# Patient Record
Sex: Female | Born: 1945 | Race: Black or African American | Hispanic: No | Smoking: Former smoker
Health system: Southern US, Community
[De-identification: ages and names within clinical notes are randomized; demographics above are authoritative.]

## PROBLEM LIST (undated history)

## (undated) DIAGNOSIS — I739 Peripheral vascular disease, unspecified: Secondary | ICD-10-CM

## (undated) DIAGNOSIS — I429 Cardiomyopathy, unspecified: Secondary | ICD-10-CM

## (undated) DIAGNOSIS — R55 Syncope and collapse: Secondary | ICD-10-CM

## (undated) DIAGNOSIS — H359 Unspecified retinal disorder: Secondary | ICD-10-CM

## (undated) DIAGNOSIS — I10 Essential (primary) hypertension: Secondary | ICD-10-CM

## (undated) DIAGNOSIS — G459 Transient cerebral ischemic attack, unspecified: Secondary | ICD-10-CM

## (undated) DIAGNOSIS — G709 Myoneural disorder, unspecified: Secondary | ICD-10-CM

## (undated) DIAGNOSIS — E119 Type 2 diabetes mellitus without complications: Secondary | ICD-10-CM

## (undated) DIAGNOSIS — I2510 Atherosclerotic heart disease of native coronary artery without angina pectoris: Secondary | ICD-10-CM

## (undated) DIAGNOSIS — Z531 Procedure and treatment not carried out because of patient's decision for reasons of belief and group pressure: Secondary | ICD-10-CM

## (undated) DIAGNOSIS — E785 Hyperlipidemia, unspecified: Secondary | ICD-10-CM

## (undated) HISTORY — PX: FEMORAL ARTERY STENT: SHX1583

## (undated) HISTORY — PX: VAGINAL HYSTERECTOMY: SHX2639

## (undated) HISTORY — PX: DILATION AND CURETTAGE OF UTERUS: SHX78

## (undated) HISTORY — PX: TUBAL LIGATION: SHX77

## (undated) HISTORY — DX: Hyperlipidemia, unspecified: E78.5

---

## 1997-06-03 ENCOUNTER — Other Ambulatory Visit: Admission: RE | Admit: 1997-06-03 | Discharge: 1997-06-03 | Payer: Self-pay | Admitting: Obstetrics and Gynecology

## 1999-10-31 ENCOUNTER — Emergency Department (HOSPITAL_COMMUNITY): Admission: EM | Admit: 1999-10-31 | Discharge: 1999-10-31 | Payer: Self-pay | Admitting: Emergency Medicine

## 2002-06-08 ENCOUNTER — Ambulatory Visit (HOSPITAL_COMMUNITY): Admission: RE | Admit: 2002-06-08 | Discharge: 2002-06-08 | Payer: Self-pay | Admitting: Family Medicine

## 2004-08-26 ENCOUNTER — Encounter: Admission: RE | Admit: 2004-08-26 | Discharge: 2004-08-26 | Payer: Self-pay | Admitting: Internal Medicine

## 2004-09-21 ENCOUNTER — Encounter: Admission: RE | Admit: 2004-09-21 | Discharge: 2004-09-21 | Payer: Self-pay | Admitting: Internal Medicine

## 2006-06-30 ENCOUNTER — Emergency Department (HOSPITAL_COMMUNITY): Admission: EM | Admit: 2006-06-30 | Discharge: 2006-06-30 | Payer: Self-pay | Admitting: Emergency Medicine

## 2007-11-02 ENCOUNTER — Encounter (INDEPENDENT_AMBULATORY_CARE_PROVIDER_SITE_OTHER): Payer: Self-pay | Admitting: Nurse Practitioner

## 2007-11-02 ENCOUNTER — Ambulatory Visit: Payer: Self-pay | Admitting: Internal Medicine

## 2007-11-02 DIAGNOSIS — I10 Essential (primary) hypertension: Secondary | ICD-10-CM | POA: Insufficient documentation

## 2007-11-02 DIAGNOSIS — E785 Hyperlipidemia, unspecified: Secondary | ICD-10-CM | POA: Insufficient documentation

## 2007-11-02 DIAGNOSIS — E119 Type 2 diabetes mellitus without complications: Secondary | ICD-10-CM | POA: Insufficient documentation

## 2007-11-02 DIAGNOSIS — E1149 Type 2 diabetes mellitus with other diabetic neurological complication: Secondary | ICD-10-CM

## 2007-11-02 LAB — CONVERTED CEMR LAB: Blood Glucose, Fingerstick: 183

## 2007-12-13 ENCOUNTER — Ambulatory Visit: Payer: Self-pay | Admitting: Internal Medicine

## 2007-12-13 ENCOUNTER — Telehealth (INDEPENDENT_AMBULATORY_CARE_PROVIDER_SITE_OTHER): Payer: Self-pay | Admitting: *Deleted

## 2007-12-13 DIAGNOSIS — R0989 Other specified symptoms and signs involving the circulatory and respiratory systems: Secondary | ICD-10-CM

## 2007-12-13 LAB — CONVERTED CEMR LAB
Bilirubin Urine: NEGATIVE
Nitrite: NEGATIVE
Protein, U semiquant: 30
Specific Gravity, Urine: 1.015
Urobilinogen, UA: 0.2

## 2007-12-18 ENCOUNTER — Ambulatory Visit (HOSPITAL_COMMUNITY): Admission: RE | Admit: 2007-12-18 | Discharge: 2007-12-18 | Payer: Self-pay | Admitting: Internal Medicine

## 2007-12-19 ENCOUNTER — Ambulatory Visit: Payer: Self-pay | Admitting: Internal Medicine

## 2007-12-21 ENCOUNTER — Encounter (INDEPENDENT_AMBULATORY_CARE_PROVIDER_SITE_OTHER): Payer: Self-pay | Admitting: Internal Medicine

## 2007-12-21 LAB — CONVERTED CEMR LAB
ALT: 15 units/L (ref 0–35)
AST: 16 units/L (ref 0–37)
Basophils Absolute: 0 10*3/uL (ref 0.0–0.1)
Basophils Relative: 1 % (ref 0–1)
Creatinine, Ser: 0.78 mg/dL (ref 0.40–1.20)
Eosinophils Relative: 3 % (ref 0–5)
HCT: 37.2 % (ref 36.0–46.0)
Hemoglobin: 12 g/dL (ref 12.0–15.0)
LDL Cholesterol: 138 mg/dL — ABNORMAL HIGH (ref 0–99)
MCHC: 32.3 g/dL (ref 30.0–36.0)
Microalb, Ur: 9.14 mg/dL — ABNORMAL HIGH (ref 0.00–1.89)
Monocytes Absolute: 0.3 10*3/uL (ref 0.1–1.0)
Neutro Abs: 1.8 10*3/uL (ref 1.7–7.7)
Platelets: 182 10*3/uL (ref 150–400)
RDW: 12.8 % (ref 11.5–15.5)
Total Bilirubin: 0.5 mg/dL (ref 0.3–1.2)
Total CHOL/HDL Ratio: 4
VLDL: 15 mg/dL (ref 0–40)

## 2007-12-26 ENCOUNTER — Encounter (INDEPENDENT_AMBULATORY_CARE_PROVIDER_SITE_OTHER): Payer: Self-pay | Admitting: Internal Medicine

## 2007-12-26 ENCOUNTER — Ambulatory Visit: Payer: Self-pay | Admitting: Family Medicine

## 2007-12-28 ENCOUNTER — Ambulatory Visit: Payer: Self-pay | Admitting: *Deleted

## 2008-01-01 ENCOUNTER — Encounter: Admission: RE | Admit: 2008-01-01 | Discharge: 2008-01-01 | Payer: Self-pay | Admitting: Internal Medicine

## 2008-01-01 ENCOUNTER — Encounter (INDEPENDENT_AMBULATORY_CARE_PROVIDER_SITE_OTHER): Payer: Self-pay | Admitting: *Deleted

## 2008-01-01 ENCOUNTER — Telehealth (INDEPENDENT_AMBULATORY_CARE_PROVIDER_SITE_OTHER): Payer: Self-pay | Admitting: Internal Medicine

## 2008-01-01 ENCOUNTER — Encounter (INDEPENDENT_AMBULATORY_CARE_PROVIDER_SITE_OTHER): Payer: Self-pay | Admitting: Internal Medicine

## 2008-01-16 ENCOUNTER — Encounter (INDEPENDENT_AMBULATORY_CARE_PROVIDER_SITE_OTHER): Payer: Self-pay | Admitting: Internal Medicine

## 2008-01-18 ENCOUNTER — Encounter (INDEPENDENT_AMBULATORY_CARE_PROVIDER_SITE_OTHER): Payer: Self-pay | Admitting: Internal Medicine

## 2008-02-19 ENCOUNTER — Encounter (INDEPENDENT_AMBULATORY_CARE_PROVIDER_SITE_OTHER): Payer: Self-pay | Admitting: Internal Medicine

## 2008-02-19 ENCOUNTER — Telehealth (INDEPENDENT_AMBULATORY_CARE_PROVIDER_SITE_OTHER): Payer: Self-pay | Admitting: Internal Medicine

## 2008-02-26 ENCOUNTER — Ambulatory Visit: Payer: Self-pay | Admitting: Internal Medicine

## 2008-03-26 ENCOUNTER — Encounter (INDEPENDENT_AMBULATORY_CARE_PROVIDER_SITE_OTHER): Payer: Self-pay | Admitting: Internal Medicine

## 2008-03-27 ENCOUNTER — Encounter (INDEPENDENT_AMBULATORY_CARE_PROVIDER_SITE_OTHER): Payer: Self-pay | Admitting: Internal Medicine

## 2008-03-31 LAB — CONVERTED CEMR LAB
ALT: 16 units/L (ref 0–35)
AST: 14 units/L (ref 0–37)
Bilirubin, Direct: 0.1 mg/dL (ref 0.0–0.3)
Cholesterol: 211 mg/dL — ABNORMAL HIGH (ref 0–200)
Indirect Bilirubin: 0.2 mg/dL (ref 0.0–0.9)
VLDL: 14 mg/dL (ref 0–40)

## 2008-04-16 ENCOUNTER — Encounter (INDEPENDENT_AMBULATORY_CARE_PROVIDER_SITE_OTHER): Payer: Self-pay | Admitting: Internal Medicine

## 2008-05-01 ENCOUNTER — Encounter (INDEPENDENT_AMBULATORY_CARE_PROVIDER_SITE_OTHER): Payer: Self-pay | Admitting: Internal Medicine

## 2008-05-07 ENCOUNTER — Ambulatory Visit: Payer: Self-pay | Admitting: Internal Medicine

## 2008-05-07 DIAGNOSIS — R351 Nocturia: Secondary | ICD-10-CM

## 2008-05-07 DIAGNOSIS — M25569 Pain in unspecified knee: Secondary | ICD-10-CM | POA: Insufficient documentation

## 2008-05-07 LAB — CONVERTED CEMR LAB
Blood Glucose, Fingerstick: 153
Hgb A1c MFr Bld: 8.2 %

## 2008-05-14 ENCOUNTER — Ambulatory Visit: Payer: Self-pay | Admitting: Internal Medicine

## 2008-06-04 ENCOUNTER — Ambulatory Visit: Payer: Self-pay | Admitting: Internal Medicine

## 2008-06-19 ENCOUNTER — Ambulatory Visit: Payer: Self-pay | Admitting: Internal Medicine

## 2008-07-11 LAB — CONVERTED CEMR LAB
AST: 18 units/L (ref 0–37)
Albumin: 4 g/dL (ref 3.5–5.2)
HDL: 54 mg/dL (ref >39)
Total Bilirubin: 0.3 mg/dL (ref 0.3–1.2)
Total CHOL/HDL Ratio: 3.2

## 2008-07-23 ENCOUNTER — Ambulatory Visit: Payer: Self-pay | Admitting: Internal Medicine

## 2008-08-20 ENCOUNTER — Telehealth (INDEPENDENT_AMBULATORY_CARE_PROVIDER_SITE_OTHER): Payer: Self-pay | Admitting: Internal Medicine

## 2008-09-10 ENCOUNTER — Ambulatory Visit: Payer: Self-pay | Admitting: Internal Medicine

## 2008-09-10 DIAGNOSIS — M79609 Pain in unspecified limb: Secondary | ICD-10-CM

## 2008-09-10 LAB — CONVERTED CEMR LAB
Blood Glucose, Fingerstick: 164
LDL Cholesterol: 79 mg/dL (ref 0–99)
Triglycerides: 59 mg/dL (ref <150)

## 2008-09-17 ENCOUNTER — Ambulatory Visit (HOSPITAL_COMMUNITY): Admission: RE | Admit: 2008-09-17 | Discharge: 2008-09-17 | Payer: Self-pay | Admitting: Gastroenterology

## 2008-09-19 ENCOUNTER — Ambulatory Visit: Payer: Self-pay | Admitting: Vascular Surgery

## 2008-09-19 ENCOUNTER — Encounter (INDEPENDENT_AMBULATORY_CARE_PROVIDER_SITE_OTHER): Payer: Self-pay | Admitting: Internal Medicine

## 2008-09-19 ENCOUNTER — Ambulatory Visit (HOSPITAL_COMMUNITY): Admission: RE | Admit: 2008-09-19 | Discharge: 2008-09-19 | Payer: Self-pay | Admitting: Internal Medicine

## 2008-10-01 ENCOUNTER — Encounter (INDEPENDENT_AMBULATORY_CARE_PROVIDER_SITE_OTHER): Payer: Self-pay | Admitting: Internal Medicine

## 2008-10-01 DIAGNOSIS — I739 Peripheral vascular disease, unspecified: Secondary | ICD-10-CM

## 2008-10-08 ENCOUNTER — Encounter (INDEPENDENT_AMBULATORY_CARE_PROVIDER_SITE_OTHER): Payer: Self-pay | Admitting: Internal Medicine

## 2008-10-14 ENCOUNTER — Encounter (INDEPENDENT_AMBULATORY_CARE_PROVIDER_SITE_OTHER): Payer: Self-pay | Admitting: Internal Medicine

## 2008-10-29 ENCOUNTER — Encounter (HOSPITAL_COMMUNITY): Admission: RE | Admit: 2008-10-29 | Discharge: 2009-01-10 | Payer: Self-pay | Admitting: Cardiovascular Disease

## 2008-11-13 ENCOUNTER — Ambulatory Visit: Payer: Self-pay | Admitting: Internal Medicine

## 2008-11-15 ENCOUNTER — Ambulatory Visit: Payer: Self-pay | Admitting: Internal Medicine

## 2008-11-15 LAB — CONVERTED CEMR LAB
Bilirubin Urine: NEGATIVE
Blood in Urine, dipstick: NEGATIVE
Glucose, Urine, Semiquant: NEGATIVE
Protein, U semiquant: 100
Specific Gravity, Urine: >=1.03

## 2008-11-19 ENCOUNTER — Inpatient Hospital Stay (HOSPITAL_COMMUNITY): Admission: RE | Admit: 2008-11-19 | Discharge: 2008-11-20 | Payer: Self-pay | Admitting: Cardiovascular Disease

## 2008-11-21 ENCOUNTER — Ambulatory Visit: Payer: Self-pay | Admitting: Internal Medicine

## 2008-11-21 LAB — CONVERTED CEMR LAB
Chloride: 107 meq/L (ref 96–112)
Eosinophils Relative: 2 % (ref 0–5)
HCT: 33.8 % — ABNORMAL LOW (ref 36.0–46.0)
Hemoglobin: 10.7 g/dL — ABNORMAL LOW (ref 12.0–15.0)
INR: 1 (ref 0.0–1.5)
Lymphocytes Relative: 44 % (ref 12–46)
Lymphs Abs: 2.1 10*3/uL (ref 0.7–4.0)
Monocytes Absolute: 0.2 10*3/uL (ref 0.1–1.0)
Monocytes Relative: 5 % (ref 3–12)
Potassium: 4.3 meq/L (ref 3.5–5.3)
RDW: 14.2 % (ref 11.5–15.5)
Sodium: 143 meq/L (ref 135–145)
WBC: 4.7 10*3/uL (ref 4.0–10.5)
aPTT: 26 s (ref 24–37)

## 2008-12-04 ENCOUNTER — Encounter (INDEPENDENT_AMBULATORY_CARE_PROVIDER_SITE_OTHER): Payer: Self-pay | Admitting: Internal Medicine

## 2008-12-05 ENCOUNTER — Encounter (INDEPENDENT_AMBULATORY_CARE_PROVIDER_SITE_OTHER): Payer: Self-pay | Admitting: Internal Medicine

## 2008-12-11 ENCOUNTER — Ambulatory Visit: Payer: Self-pay | Admitting: Internal Medicine

## 2008-12-11 DIAGNOSIS — D649 Anemia, unspecified: Secondary | ICD-10-CM

## 2008-12-11 DIAGNOSIS — E11311 Type 2 diabetes mellitus with unspecified diabetic retinopathy with macular edema: Secondary | ICD-10-CM

## 2008-12-11 LAB — CONVERTED CEMR LAB: Blood Glucose, Fingerstick: 134

## 2008-12-12 ENCOUNTER — Encounter (INDEPENDENT_AMBULATORY_CARE_PROVIDER_SITE_OTHER): Payer: Self-pay | Admitting: Internal Medicine

## 2008-12-12 ENCOUNTER — Telehealth (INDEPENDENT_AMBULATORY_CARE_PROVIDER_SITE_OTHER): Payer: Self-pay | Admitting: Internal Medicine

## 2008-12-23 ENCOUNTER — Encounter (INDEPENDENT_AMBULATORY_CARE_PROVIDER_SITE_OTHER): Payer: Self-pay | Admitting: Internal Medicine

## 2008-12-27 ENCOUNTER — Ambulatory Visit: Payer: Self-pay | Admitting: Internal Medicine

## 2008-12-29 ENCOUNTER — Encounter (INDEPENDENT_AMBULATORY_CARE_PROVIDER_SITE_OTHER): Payer: Self-pay | Admitting: Internal Medicine

## 2008-12-29 DIAGNOSIS — D509 Iron deficiency anemia, unspecified: Secondary | ICD-10-CM

## 2008-12-29 LAB — CONVERTED CEMR LAB
Basophils Absolute: 0.1 10*3/uL (ref 0.0–0.1)
Eosinophils Absolute: 0.2 10*3/uL (ref 0.0–0.7)
Iron: 65 ug/dL (ref 42–145)
Lymphs Abs: 2.1 10*3/uL (ref 0.7–4.0)
MCV: 95.5 fL (ref 78.0–100.0)
Neutrophils Relative %: 46 % (ref 43–77)
Platelets: 211 10*3/uL (ref 150–400)
RDW: 14.3 % (ref 11.5–15.5)
Saturation Ratios: 19 % — ABNORMAL LOW (ref 20–55)
UIBC: 270 ug/dL
WBC: 4.9 10*3/uL (ref 4.0–10.5)

## 2009-01-01 ENCOUNTER — Encounter (INDEPENDENT_AMBULATORY_CARE_PROVIDER_SITE_OTHER): Payer: Self-pay | Admitting: Internal Medicine

## 2009-01-02 ENCOUNTER — Ambulatory Visit (HOSPITAL_COMMUNITY): Admission: RE | Admit: 2009-01-02 | Discharge: 2009-01-02 | Payer: Self-pay | Admitting: Internal Medicine

## 2009-01-27 ENCOUNTER — Encounter (INDEPENDENT_AMBULATORY_CARE_PROVIDER_SITE_OTHER): Payer: Self-pay | Admitting: *Deleted

## 2009-02-17 ENCOUNTER — Inpatient Hospital Stay (HOSPITAL_COMMUNITY): Admission: RE | Admit: 2009-02-17 | Discharge: 2009-02-18 | Payer: Self-pay | Admitting: Cardiovascular Disease

## 2009-03-05 ENCOUNTER — Encounter (INDEPENDENT_AMBULATORY_CARE_PROVIDER_SITE_OTHER): Payer: Self-pay | Admitting: Internal Medicine

## 2009-05-15 ENCOUNTER — Ambulatory Visit: Payer: Self-pay | Admitting: Internal Medicine

## 2009-05-15 LAB — CONVERTED CEMR LAB: Blood Glucose, Fingerstick: 271

## 2009-06-16 ENCOUNTER — Encounter (INDEPENDENT_AMBULATORY_CARE_PROVIDER_SITE_OTHER): Payer: Self-pay | Admitting: Internal Medicine

## 2009-07-07 ENCOUNTER — Encounter: Admission: RE | Admit: 2009-07-07 | Discharge: 2009-07-07 | Payer: Self-pay | Admitting: Cardiovascular Disease

## 2009-07-10 ENCOUNTER — Inpatient Hospital Stay (HOSPITAL_COMMUNITY): Admission: RE | Admit: 2009-07-10 | Discharge: 2009-07-12 | Payer: Self-pay | Admitting: Cardiovascular Disease

## 2009-07-24 ENCOUNTER — Ambulatory Visit: Payer: Self-pay | Admitting: Internal Medicine

## 2009-08-08 ENCOUNTER — Ambulatory Visit: Payer: Self-pay | Admitting: Internal Medicine

## 2009-08-26 DIAGNOSIS — R809 Proteinuria, unspecified: Secondary | ICD-10-CM | POA: Insufficient documentation

## 2009-08-26 LAB — CONVERTED CEMR LAB
ALT: 14 units/L (ref 0–35)
BUN: 22 mg/dL (ref 6–23)
Basophils Relative: 0 % (ref 0–1)
CO2: 28 meq/L (ref 19–32)
Calcium: 9.5 mg/dL (ref 8.4–10.5)
Chloride: 106 meq/L (ref 96–112)
Cholesterol: 140 mg/dL (ref 0–200)
Creatinine, Ser: 1.13 mg/dL (ref 0.40–1.20)
Eosinophils Absolute: 0.3 10*3/uL (ref 0.0–0.7)
Eosinophils Relative: 6 % — ABNORMAL HIGH (ref 0–5)
Glucose, Bld: 117 mg/dL — ABNORMAL HIGH (ref 70–99)
HCT: 33.8 % — ABNORMAL LOW (ref 36.0–46.0)
Hemoglobin: 10.7 g/dL — ABNORMAL LOW (ref 12.0–15.0)
Lymphs Abs: 1.9 10*3/uL (ref 0.7–4.0)
MCHC: 31.7 g/dL (ref 30.0–36.0)
MCV: 94.9 fL (ref 78.0–100.0)
Monocytes Absolute: 0.3 10*3/uL (ref 0.1–1.0)
Monocytes Relative: 6 % (ref 3–12)
Neutrophils Relative %: 47 % (ref 43–77)
RBC: 3.56 M/uL — ABNORMAL LOW (ref 3.87–5.11)
Total Bilirubin: 0.3 mg/dL (ref 0.3–1.2)
Total CHOL/HDL Ratio: 2.9
Triglycerides: 56 mg/dL (ref <150)
VLDL: 11 mg/dL (ref 0–40)
WBC: 4.7 10*3/uL (ref 4.0–10.5)

## 2009-09-12 ENCOUNTER — Ambulatory Visit: Payer: Self-pay | Admitting: Internal Medicine

## 2009-09-12 LAB — CONVERTED CEMR LAB: Blood Glucose, Fingerstick: 175

## 2009-09-26 ENCOUNTER — Ambulatory Visit: Payer: Self-pay | Admitting: Internal Medicine

## 2009-10-14 ENCOUNTER — Telehealth (INDEPENDENT_AMBULATORY_CARE_PROVIDER_SITE_OTHER): Payer: Self-pay | Admitting: Internal Medicine

## 2009-10-22 ENCOUNTER — Telehealth (INDEPENDENT_AMBULATORY_CARE_PROVIDER_SITE_OTHER): Payer: Self-pay | Admitting: Internal Medicine

## 2009-10-30 ENCOUNTER — Telehealth (INDEPENDENT_AMBULATORY_CARE_PROVIDER_SITE_OTHER): Payer: Self-pay | Admitting: *Deleted

## 2009-10-30 ENCOUNTER — Ambulatory Visit: Payer: Self-pay | Admitting: Internal Medicine

## 2009-11-28 ENCOUNTER — Ambulatory Visit: Payer: Self-pay | Admitting: Internal Medicine

## 2009-11-28 DIAGNOSIS — R82998 Other abnormal findings in urine: Secondary | ICD-10-CM | POA: Insufficient documentation

## 2009-11-28 LAB — CONVERTED CEMR LAB
Bilirubin Urine: NEGATIVE
Glucose, Urine, Semiquant: NEGATIVE
Hgb A1c MFr Bld: 6 %
Lymphocytes Relative: 35 % (ref 12–46)
Lymphs Abs: 1.8 10*3/uL (ref 0.7–4.0)
Monocytes Relative: 6 % (ref 3–12)
Neutrophils Relative %: 53 % (ref 43–77)
Nitrite: NEGATIVE
Platelets: 188 10*3/uL (ref 150–400)
RBC: 3.79 M/uL — ABNORMAL LOW (ref 3.87–5.11)
Specific Gravity, Urine: 1.02
Urobilinogen, UA: 0.2
WBC: 5.1 10*3/uL (ref 4.0–10.5)

## 2009-11-29 ENCOUNTER — Encounter (INDEPENDENT_AMBULATORY_CARE_PROVIDER_SITE_OTHER): Payer: Self-pay | Admitting: Internal Medicine

## 2009-12-10 ENCOUNTER — Ambulatory Visit: Payer: Self-pay | Admitting: Cardiovascular Disease

## 2009-12-10 ENCOUNTER — Encounter (INDEPENDENT_AMBULATORY_CARE_PROVIDER_SITE_OTHER): Payer: Self-pay | Admitting: Internal Medicine

## 2009-12-10 LAB — CONVERTED CEMR LAB
ALT: 12 units/L (ref 0–35)
Albumin: 3.9 g/dL (ref 3.5–5.2)
Cholesterol: 157 mg/dL (ref 0–200)
HDL: 48 mg/dL (ref >39)
LDL Cholesterol: 98 mg/dL (ref 0–99)
Total CHOL/HDL Ratio: 3.3
Total Protein: 6.9 g/dL (ref 6.0–8.3)
Triglycerides: 54 mg/dL (ref <150)
VLDL: 11 mg/dL (ref 0–40)

## 2009-12-18 ENCOUNTER — Ambulatory Visit: Payer: Self-pay | Admitting: Internal Medicine

## 2009-12-19 ENCOUNTER — Encounter (INDEPENDENT_AMBULATORY_CARE_PROVIDER_SITE_OTHER): Payer: Self-pay | Admitting: Internal Medicine

## 2010-01-02 LAB — CONVERTED CEMR LAB
Bilirubin Urine: NEGATIVE
Glucose, Urine, Semiquant: NEGATIVE
Ketones, urine, test strip: NEGATIVE
Specific Gravity, Urine: 1.02
Urobilinogen, UA: 0.2
pH: 5.5

## 2010-01-03 ENCOUNTER — Observation Stay (HOSPITAL_COMMUNITY): Admission: EM | Admit: 2010-01-03 | Discharge: 2010-01-05 | Payer: Self-pay | Admitting: Emergency Medicine

## 2010-01-03 ENCOUNTER — Ambulatory Visit: Payer: Self-pay | Admitting: Family Medicine

## 2010-01-03 ENCOUNTER — Emergency Department (HOSPITAL_COMMUNITY): Admission: EM | Admit: 2010-01-03 | Discharge: 2010-01-03 | Payer: Self-pay | Admitting: Family Medicine

## 2010-01-06 ENCOUNTER — Telehealth (INDEPENDENT_AMBULATORY_CARE_PROVIDER_SITE_OTHER): Payer: Self-pay | Admitting: Internal Medicine

## 2010-01-07 ENCOUNTER — Encounter (INDEPENDENT_AMBULATORY_CARE_PROVIDER_SITE_OTHER): Payer: Self-pay | Admitting: Internal Medicine

## 2010-01-12 ENCOUNTER — Ambulatory Visit: Payer: Self-pay | Admitting: Internal Medicine

## 2010-01-12 LAB — CONVERTED CEMR LAB
ALT: 31 units/L (ref 0–35)
AST: 21 units/L (ref 0–37)
Albumin: 4.1 g/dL (ref 3.5–5.2)
Alkaline Phosphatase: 83 units/L (ref 39–117)
Cholesterol: 148 mg/dL (ref 0–200)
HDL: 41 mg/dL (ref >39)
Total Bilirubin: 0.4 mg/dL (ref 0.3–1.2)
Triglycerides: 76 mg/dL (ref <150)

## 2010-01-13 ENCOUNTER — Ambulatory Visit: Payer: Self-pay | Admitting: Internal Medicine

## 2010-01-20 ENCOUNTER — Ambulatory Visit (HOSPITAL_COMMUNITY)
Admission: RE | Admit: 2010-01-20 | Discharge: 2010-01-20 | Payer: Self-pay | Source: Home / Self Care | Admitting: Internal Medicine

## 2010-01-27 ENCOUNTER — Ambulatory Visit: Payer: Self-pay | Admitting: Internal Medicine

## 2010-03-29 ENCOUNTER — Encounter: Payer: Self-pay | Admitting: Internal Medicine

## 2010-04-02 ENCOUNTER — Ambulatory Visit: Admit: 2010-04-02 | Payer: Self-pay | Admitting: Internal Medicine

## 2010-04-09 NOTE — Assessment & Plan Note (Signed)
Summary: 4 MONTH FU FOR CPP////KT   Vital Signs:  Patient profile:   65 year old female Menstrual status:  postmenopausal Height:      65.5 inches Weight:      190.5 pounds Temp:     96.8 degrees F oral Pulse rate:   72 / minute Pulse rhythm:   regular Resp:     14 per minute BP sitting:   172 / 64  (right arm) Cuff size:   large  Vitals Entered By: Rhunette Croft (November 28, 2009 9:39 AM) CC: cpp Pain Assessment Patient in pain? no      CBG Result 91 CBG Device ID [id number]  Does patient need assistance? Ambulation Normal   CC:  cpp.  History of Present Illness: 65 yo female here for CPP.  Concerns:  1.  Feels bp is up today as was almost in accident today.  BP at home running 130-150/60-70  2.  Diabetic Macular edema:  Not clear what happened with [facility] with pt. last fall.  Sounds like pt. was not accepted and she did not take it any further.  3.  Unknown medication for her leg from Dr. Leilani Able not yet started.  Was given last month--for PAD pain.    Current Medications (verified): 1)  Metformin Hcl 1000 Mg Tabs (Metformin Hcl) .Marland Kitchen.. 1 By Mouth Two Times A Day 2)  Actos 30 Mg Tabs (Pioglitazone Hcl) .Marland Kitchen.. 1 By Mouth Once Daily 3)  Skelaxin 800 Mg Tabs (Metaxalone) .Marland Kitchen.. 1 By Mouth Once Daily 4)  Lisinopril-Hydrochlorothiazide 20-25 Mg Tabs (Lisinopril-Hydrochlorothiazide) .Marland Kitchen.. 1 Tab By Mouth Daily 5)  Labetalol Hcl 100 Mg Tabs (Labetalol Hcl) .Marland Kitchen.. 1 1/2  Tabs By Mouth Two Times A Day 6)  Glipizide 10 Mg Tabs (Glipizide) .Marland Kitchen.. 1 By Mouth Two Times A Day 7)  Lisinopril 20 Mg Tabs (Lisinopril) .Marland Kitchen.. 1 Tab By Mouth Daily With Lisinopril/hctz 8)  Crestor 40 Mg Tabs (Rosuvastatin Calcium) .Marland Kitchen.. 1 Tab By Mouth Daily 9)  Byetta 5 Mcg Pen 5 Mcg/0.36ml Soln (Exenatide) .... 5 Micrograms Injected Two Times A Day Before Meals 10)  Plavix 75 Mg Tabs (Clopidogrel Bisulfate) .... Once A Day 11)  Ferrous Sulfate 325 (65 Fe) Mg Tabs (Ferrous Sulfate) .Marland Kitchen.. 1 Tab By  Mouth Daily 12)  Folic Acid 1 Mg Tabs (Folic Acid) .Marland Kitchen.. 1 Tab By Mouth Daily 13)  Amlodipine Besylate 10 Mg Tabs (Amlodipine Besylate) 14)  Aspirin 81 Mg Tbec (Aspirin) .Marland Kitchen.. 1 Tab By Mouth With Meal 15)  Bd Ultra-Fine Pen Needles  31 G 5 Mm .... 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  1994:  ? Vaginal hysterectomy for pain.  Ovaries left. 2.  Bilateral bunion repairs 3.  11/19/08:  Catheterization, PTA and Stenting of bilateral common iliac arteries.  Dr. Gwenlyn Found. Also noted to have Bilateral renal artery stenosis. 4.  07/10/09:  Rotational atherectomy and PTA of distal left SFA.--unsuccessful per pt.  Dr. Gwenlyn Found  Family History: Mother, died 27:  DM, Hypertension, unknown heart problems Father, died 2:  "old age" 2 Sisters:  Oldest sister with seizure disorder, other sister with DM 3 Brothers:  1 died age 13:  unknown cancer, oldest brother with DM, hypertension, 3 rd brother with DM. 5 Children:  Son, 12 with Htn., another son with HIV,  rest are healthy  Social History: Divorced. 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 Not sexually active  for years. Tobacco Use:  never--never around smokers as well.  Alcohol Use:  occasional Drug Use:  never  Review of Systems General:  Energy is fine--just cannot walk very far secondary to pain from PAD. Eyes:  Complains of blurring. ENT:  Denies decreased hearing. CV:  Denies chest pain or discomfort and palpitations. Resp:  Denies cough and shortness of breath. GI:  Denies abdominal pain, bloody stools, dark tarry stools, and diarrhea; occasional constipation. GU:  Denies discharge, dysuria, and urinary frequency. MS:  Denies joint pain, joint redness, and joint swelling. Derm:  Denies lesion(s) and rash. Neuro:  Chronic numbness of feet.Marland Kitchen Psych:  Denies suicidal thoughts/plans; PHQ9 scored a 6.  Pt. does not feel she is significantly depressed--has shortlived episodes.  Not interested in counseling.Marland Kitchen  Physical Exam  General:  Obese female, NAD Head:  Normocephalic and atraumatic without obvious abnormalities. No apparent alopecia or balding. Eyes:  No corneal or conjunctival inflammation noted. EOMI. Perrla. Funduscopic exam with sharp discs, unable to view adequately to evaluate for macular edema, exudates or hemorrhages. Ears:  External ear exam shows no significant lesions or deformities.  Otoscopic examination reveals clear canals, tympanic membranes are intact bilaterally without bulging, retraction, inflammation or discharge. Hearing is grossly normal bilaterally. Nose:  External nasal examination shows no deformity or inflammation. Nasal mucosa are pink and moist without lesions or exudates. Mouth:  Oral mucosa and oropharynx without lesions or exudates.  Dentures Neck:  No deformities, masses, or tenderness noted. Chest Wall:  No deformities, masses, or tenderness noted. Breasts:  No mass, nodules, thickening, tenderness, bulging, retraction, inflamation, nipple discharge or skin changes noted.   Lungs:  Normal respiratory effort, chest expands symmetrically. Lungs are clear  to auscultation, no crackles or wheezes. Heart:  Normal rate and regular rhythm. S1 and S2 normal without gallop,click, rub.  Does have a Grade II-III/VI SEM that radiates to carotids. Abdomen:  Bowel sounds positive,abdomen soft and non-tender without masses, organomegaly or hernias noted. Rectal:  No external abnormalities noted. Normal sphincter tone. No rectal masses or tenderness.  Heme negative dark brown stool Genitalia:  Pelvic Exam:        External: normal female genitalia without lesions or masses                Adnexa: normal bimanual exam without masses or fullness        Uterus: absent on palpation        Pap smear: not performed Msk:  No deformity or scoliosis noted of thoracic or lumbar spine.   Pulses:  R and L carotid,radial, are full and equal bilaterally.  Bilateral carotid bruits vs. radiation of aortic murmur.  Decreased femoral, DP and PT pulses bilaterally Extremities:  No clubbing, cyanosis, edema, or deformity noted with normal full range of motion of all joints.  Feet cool to touch Neurologic:  No cranial nerve deficits noted. Station and gait are normal. Plantar reflexes are down-going bilaterally. DTRs are symmetrical throughout.  motor and coordinative  functions appear intact. Skin:  Intact without suspicious lesions or rashes Cervical Nodes:  No lymphadenopathy noted Axillary Nodes:  No palpable lymphadenopathy Inguinal Nodes:  No significant adenopathy Psych:  Cognition and judgment appear intact. Alert and cooperative with normal attention span and concentration. No apparent delusions, illusions, hallucinations  Diabetes Management Exam:    Foot Exam (with socks and/or shoes not present):       Sensory-Monofilament:          Left foot: normal          Right foot: normal   Impression & Recommendations:  Problem # 1:  ROUTINE GYNECOLOGICAL EXAMINATION (ICD-V72.31) Mammogram scheduled  Problem # 2:  HEALTH MAINTENANCE EXAM (ICD-V70.0) Guaiac cards x 3  to  return in 2 weeks. Encouraged to call for flu vaccine  Problem # 3:  URINALYSIS, ABNORMAL (ICD-791.9)  Orders: T-Culture, Urine [id number])  Problem # 4:  ANEMIA, IRON DEFICIENCY (ICD-280.9)  Her updated medication list for this problem includes:    Ferrous Sulfate 325 (65 Fe) Mg Tabs (Ferrous sulfate) .Marland Kitchen... 1 tab by mouth daily    Folic Acid 1 Mg Tabs (Folic acid) .Marland Kitchen... 1 tab by mouth daily  Orders: T-CBC w/Diff [id number])  Problem # 5:  DIABETIC MACULAR EDEMA (ICD-362.07)  Orders: Ophthalmology Referral (Ophthalmology)  Problem # 6:  PERIPHERAL VASCULAR DISEASE (ICD-443.9) Pt. to call new medication given by cardiology Her updated medication list for this problem includes:    Plavix 75 Mg Tabs (Clopidogrel bisulfate) ..... Once a day  Problem # 7:  ESSENTIAL HYPERTENSION (ICD-401.9) Up today, but anxious. Pt. has been noncompliant in past Her updated medication list for this problem includes:    Lisinopril-hydrochlorothiazide 20-25 Mg Tabs (Lisinopril-hydrochlorothiazide) .Marland Kitchen... 1 tab by mouth daily    Labetalol Hcl 100 Mg Tabs (Labetalol hcl) .Marland Kitchen... 1 1/2  tabs by mouth two times a day    Lisinopril 20 Mg Tabs (Lisinopril) .Marland Kitchen... 1 tab by mouth daily with lisinopril/hctz    Amlodipine Besylate 10 Mg Tabs (Amlodipine besylate) .Marland Kitchen... 1 tab by mouth daily  Problem # 8:  HYPERLIPIDEMIA (ICD-272.4) Clkose to goal earlier in year. Her updated medication list for this problem includes:    Crestor 40 Mg Tabs (Rosuvastatin calcium) .Marland Kitchen... 1 tab by mouth daily  Complete Medication List: 1)  Metformin Hcl 1000 Mg Tabs (Metformin hcl) .Marland Kitchen.. 1 by mouth two times a day 2)  Actos 30 Mg Tabs (Pioglitazone hcl) .Marland Kitchen.. 1 by mouth once daily 3)  Lisinopril-hydrochlorothiazide 20-25 Mg Tabs (Lisinopril-hydrochlorothiazide) .Marland Kitchen.. 1 tab by mouth daily 4)  Labetalol Hcl 100 Mg Tabs (Labetalol hcl) .Marland Kitchen.. 1 1/2  tabs by mouth two times a day 5)  Glipizide 10 Mg Tabs (Glipizide) .Marland Kitchen.. 1 by mouth  two times a day 6)  Lisinopril 20 Mg Tabs (Lisinopril) .Marland Kitchen.. 1 tab by mouth daily with lisinopril/hctz 7)  Crestor 40 Mg Tabs (Rosuvastatin calcium) .Marland Kitchen.. 1 tab by mouth daily 8)  Byetta 5 Mcg Pen 5 Mcg/0.80ml Soln (Exenatide) .... 5 micrograms injected two times a day before meals 9)  Plavix 75 Mg Tabs (Clopidogrel bisulfate) .... Once a day 10)  Ferrous Sulfate 325 (65 Fe) Mg Tabs (Ferrous sulfate) .Marland Kitchen.. 1 tab by mouth daily 11)  Folic Acid 1 Mg Tabs (Folic acid) .Marland Kitchen.. 1 tab by mouth daily 12)  Amlodipine Besylate 10 Mg Tabs (Amlodipine besylate) .Marland Kitchen.. 1 tab by mouth daily 13)  Aspirin 81 Mg Tbec (Aspirin) .Marland Kitchen.. 1 tab by mouth with meal 14)  Bd Ultra-fine Pen  Needles 31 G 5 Mm  .... For two times a day injections of byetta 15)  Cipro 500 Mg Tabs (Ciprofloxacin hcl) .Marland Kitchen.. 1 tab by mouth two times a day for 3 days  Other Orders: UA Dipstick w/o Micro (manual) (81002) Hemoglobin A1C [id number])  Patient Instructions: 1)  4 servings of lowfat milk, yogurt or cheese daily 2)  Regular exercise--start slow and gradually work up to 1 hour daily 3)  Follow up with Dr. Amil Amen in 4 months --DM, htn  Laboratory Results   Urine Tests  Date/Time Received: November 28, 2009 Date/Time Reported: 9:42 AM  Routine Urinalysis   Color: lt. yellow Appearance: Clear Glucose: negative   (Normal Range: Negative) Bilirubin: negative   (Normal Range: Negative) Ketone: negative   (Normal Range: Negative) Spec. Gravity: 1.020   (Normal Range: 1.003-1.035) Blood: trace-lysed   (Normal Range: Negative) pH: 5.0   (Normal Range: 5.0-8.0) Protein: 30   (Normal Range: Negative) Urobilinogen: 0.2   (Normal Range: 0-1) Nitrite: negative   (Normal Range: Negative) Leukocyte Esterace: moderate   (Normal Range: Negative)     Blood Tests     HGBA1C: 6.0%   (Normal Range: Non-Diabetic - 3-6%   Control Diabetic - 6-8%) CBG Random:: 91       Preventive Care Screening  Prior Values:    PPD:  negative  (11/15/2008)    Mammogram:  ASSESSMENT: Negative - BI-RADS 1^MM DIGITAL SCREENING (01/02/2009)    Colonoscopy:  diverticulosis, no new polyps  Dr. Velora Heckler (09/17/2008)    Last Tetanus Booster:  Historical (03/09/2003)    Last Flu Shot:  Fluvax 3+ (12/11/2008)    Last Pneumovax:  Pneumovax (State) (12/13/2007)     SBE:  No.   Pap:  hx of hysterectomy Osteoprevention:  Not much in dairy daily.  Not much exercise. Guaiac Cards:  has not done in some time.   Last LDL:                                                 81 (08/08/2009 9:27:00 PM)        Diabetic Foot Exam Last Podiatry Exam Date: 12/11/2008    10-g (5.07) Semmes-Weinstein Monofilament Test Performed by: Rhunette Croft          Right Foot          Left Foot Visual Inspection               Test Control      normal         normal Site 1         normal         normal Site 2         normal         normal Site 3         normal         normal Site 4         normal         normal Site 5         normal         normal Site 6         normal         normal Site 7         normal         normal Site  8         normal         normal Site 9         normal         normal Site 10         normal         normal  Impression      normal         normal   Appended Document: 4 MONTH FU FOR CPP////KT  Laboratory Results    Stool - Occult Blood Hemmoccult #1: negative Date: 12/22/2009 Hemoccult #2: negative Date: 12/22/2009 Hemoccult #3: negative Date: 12/22/2009

## 2010-04-09 NOTE — Letter (Signed)
Summary: [facility]   [facility]   Imported By: Roland Earl 03/16/2010 16:45:35  _____________________________________________________________________  External Attachment:    Type:   Image     Comment:   External Document

## 2010-04-09 NOTE — Assessment & Plan Note (Signed)
Summary: 2 WEEK FOR BP///KT  Nurse Visit   Vital Signs:  Patient profile:   65 year old female Menstrual status:  postmenopausal Weight:      183.9 pounds Pulse rate:   80 / minute Pulse rhythm:   regular Resp:     20 per minute BP sitting:   138 / 60  (right arm) Cuff size:   large  Vitals Entered By: Sherian Maroon RN (January 27, 2010 11:06 AM)  Impression & Recommendations:  Problem # 1:  ESSENTIAL HYPERTENSION (ICD-401.9) Blood pressure is better -- 138/60  Continue medications as ordered.  Her updated medication list for this problem includes:    Lisinopril-hydrochlorothiazide 20-25 Mg Tabs (Lisinopril-hydrochlorothiazide) .Marland Kitchen... 1 tab by mouth daily    Labetalol Hcl 100 Mg Tabs (Labetalol hcl) .Marland Kitchen... 1 1/2  tabs by mouth two times a day    Lisinopril 20 Mg Tabs (Lisinopril) .Marland Kitchen... 1 tab by mouth daily with lisinopril/hctz    Amlodipine Besylate 10 Mg Tabs (Amlodipine besylate) .Marland Kitchen... 1 tab by mouth daily  Problem # 2:  DIABETES MELLITUS, TYPE II, UNCONTROLLED (ICD-250.02)  Take CBG x1 month -- keep a log.  Note when Byetta restarted.  Her updated medication list for this problem includes:    Metformin Hcl 1000 Mg Tabs (Metformin hcl) .Marland Kitchen... 1 by mouth two times a day    Lisinopril-hydrochlorothiazide 20-25 Mg Tabs (Lisinopril-hydrochlorothiazide) .Marland Kitchen... 1 tab by mouth daily    Lisinopril 20 Mg Tabs (Lisinopril) .Marland Kitchen... 1 tab by mouth daily with lisinopril/hctz    Byetta 5 Mcg Pen 5 Mcg/0.71ml Soln (Exenatide) .Marland KitchenMarland KitchenMarland KitchenMarland Kitchen 5 micrograms injected two times a day before meals    Aspirin 81 Mg Tbec (Aspirin) .Marland Kitchen... 1 tab by mouth with meal  Complete Medication List: 1)  Metformin Hcl 1000 Mg Tabs (Metformin hcl) .Marland Kitchen.. 1 by mouth two times a day 2)  Lisinopril-hydrochlorothiazide 20-25 Mg Tabs (Lisinopril-hydrochlorothiazide) .Marland Kitchen.. 1 tab by mouth daily 3)  Labetalol Hcl 100 Mg Tabs (Labetalol hcl) .Marland Kitchen.. 1 1/2  tabs by mouth two times a day 4)  Lisinopril 20 Mg Tabs (Lisinopril) .Marland Kitchen.. 1 tab  by mouth daily with lisinopril/hctz 5)  Crestor 40 Mg Tabs (Rosuvastatin calcium) .Marland Kitchen.. 1 tab by mouth daily 6)  Byetta 5 Mcg Pen 5 Mcg/0.60ml Soln (Exenatide) .... 5 micrograms injected two times a day before meals 7)  Plavix 75 Mg Tabs (Clopidogrel bisulfate) .... Once a day 8)  Ferrous Sulfate 325 (65 Fe) Mg Tabs (Ferrous sulfate) .Marland Kitchen.. 1 tab by mouth daily 9)  Folic Acid 1 Mg Tabs (Folic acid) .Marland Kitchen.. 1 tab by mouth daily 10)  Amlodipine Besylate 10 Mg Tabs (Amlodipine besylate) .Marland Kitchen.. 1 tab by mouth daily 11)  Aspirin 81 Mg Tbec (Aspirin) .Marland Kitchen.. 1 tab by mouth with meal 12)  Bd Ultra-fine Pen Needles 31 G 5 Mm  .... For two times a day injections of byetta 13)  Macrobid 100 Mg Caps (Nitrofurantoin monohyd macro) .Marland Kitchen.. 1 cap by mouth two times a day for 7 days. 14)  Wavesense Presto W/device Kit (Blood glucose monitoring suppl) .... Once daily glucose testing 15)  Wavesense Presto Test Strp (Glucose blood) .... Once daily glucose check 16)  Wavesense Ultra-thin Lancets Misc (Lancets) .... Once daily glucose check   Patient Instructions: 1)  Reviewed with Dr. Amil Amen. 2)  Your blood pressure is better -- 138/60 3)  Redo your sugars for one month -- keep a log.  Make a note on the log when you restart your Byetta. 4)  Make sure you take all of your medications as ordered.  5)  Call if anything changes or if you have any questions.   Review of Systems CV:  States asymptomatic.  Marland Kitchen  CC: BP recheck Is Patient Diabetic? Yes Did you bring your meter with you today? No Pain Assessment Patient in pain? no       Does patient need assistance? Functional Status Self care Ambulation Normal   Serial Vital Signs/Assessments:  Time      Position  BP       Pulse  Resp  Temp     By 11:22 AM  L Arm     168/62                         Sherian Maroon RN 11:35 AM  R Arm     136/60                         Sherian Maroon RN 11:35 AM  L Arm     150/60                         Sherian Maroon RN   CC:  BP  recheck.  History of Present Illness: 01/13/10  BP 192/80.  For BP recheck.  Took medications about one hour before visit.  Brought CBG log with her.  States asymptomatic with CBGs.  Has not taken Byetta for last two days because she can't find her pen.    Denies headache, cough.  Having some visual problems, blurry, states she's been told she needs laser surgery for vision.   Allergies: No Known Drug Allergies  Orders Added: 1)  Est. Patient Level I [id number]

## 2010-04-09 NOTE — Letter (Signed)
Summary: [facility]   [facility]   Imported By: Roland Earl 10/27/2009 12:41:28  _____________________________________________________________________  External Attachment:    Type:   Image     Comment:   External Document

## 2010-04-09 NOTE — Assessment & Plan Note (Signed)
Summary: HOSP FU///KT   Vital Signs:  Patient profile:   65 year old female Menstrual status:  postmenopausal Weight:      183.8 pounds Temp:     97.4 degrees F oral BP sitting:   192 / 80  (right arm)  Vitals Entered By: Oswaldo Conroy, CMA CC: Pt. is here for a hosp f/u from 01/03/10. She states her blood sugar feel to 32.  Is Patient Diabetic? Yes Pain Assessment Patient in pain? no      CBG Result 117 CBG Device ID B  Does patient need assistance? Functional Status Self care Ambulation Normal   CC:  Pt. is here for a hosp f/u from 01/03/10. She states her blood sugar feel to 32. Marland Kitchen  History of Present Illness: 1.  Hospitalization for confusion and low blood sugar.  Pt. was not eating well  in day leading up to hospitalization secondary to some nausea after procedure at eye doctor.  Continued meds with poor oral   intake.  In hospital, Actos  and glucotrol stopped and sugars brought up with IV fluids and improved oral intake.  Confusion resolved with improved sugars.  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  Actos and glucotrol were not restarted.  Sugars since discharge and off Actos running mid 100s generally.  Is using Byetta two times a day   2.  UA was abnormal, but culture was low count and multiple bacterial forms.  3.  Diabetic retinopathy:  needs laser surgery--following with Dr. Ricki Miller.    4.  Hypertension:  BP at home have been in 150-160.  Pt. states she is getting Amlodipine at [facility], rest at [facility].  Has been questionable that she is taking appropriately in past.  Would like to move Amlodipine to [facility] from [place].  5.  Hypercholesterolemia:  Discussed LDL still a bit up with recent check.  Dr. Gwenlyn Found has added Zetia, but has not yet started.    Working on diet.  States has not missed meds..    Current Medications (verified): 1)  Metformin Hcl 1000 Mg Tabs (Metformin Hcl) .Marland Kitchen.. 1 By Mouth Two Times A Day 2)  Actos 30 Mg Tabs  (Pioglitazone Hcl) .Marland Kitchen.. 1 By Mouth Once Daily 3)  Lisinopril-Hydrochlorothiazide 20-25 Mg Tabs (Lisinopril-Hydrochlorothiazide) .Marland Kitchen.. 1 Tab By Mouth Daily 4)  Labetalol Hcl 100 Mg Tabs (Labetalol Hcl) .Marland Kitchen.. 1 1/2  Tabs By Mouth Two Times A Day 5)  Glipizide 10 Mg Tabs (Glipizide) .Marland Kitchen.. 1 By Mouth Two Times A Day 6)  Lisinopril 20 Mg Tabs (Lisinopril) .Marland Kitchen.. 1 Tab By Mouth Daily With Lisinopril/hctz 7)  Crestor 40 Mg Tabs (Rosuvastatin Calcium) .Marland Kitchen.. 1 Tab By Mouth Daily 8)  Byetta 5 Mcg Pen 5 Mcg/0.29ml Soln (Exenatide) .... 5 Micrograms Injected Two Times A Day Before Meals 9)  Plavix 75 Mg Tabs (Clopidogrel Bisulfate) .... Once A Day 10)  Ferrous Sulfate 325 (65 Fe) Mg Tabs (Ferrous Sulfate) .Marland Kitchen.. 1 Tab By Mouth Daily 11)  Folic Acid 1 Mg Tabs (Folic Acid) .Marland Kitchen.. 1 Tab By Mouth Daily 12)  Amlodipine Besylate 10 Mg Tabs (Amlodipine Besylate) .Marland Kitchen.. 1 Tab By Mouth Daily 13)  Aspirin 81 Mg Tbec (Aspirin) .Marland Kitchen.. 1 Tab By Mouth With Meal 14)  Bd Ultra-Fine Pen Needles  31 G 5 Mm .... For Two Times A Day Injections of Byetta 15)  Macrobid 100 Mg Caps (Nitrofurantoin Monohyd Macro) .Marland Kitchen.. 1 Cap By Mouth Two Times A Day For 7 Days. 16)  Flavia Shipper Presto W/device Kit (Blood Glucose Monitoring Suppl) .... Once Daily Glucose Testing 17)  Wavesense Presto Test  Strp (Glucose Blood) .... Once Daily Glucose Check 18)  Wavesense Ultra-Thin Lancets  Misc (Lancets) .... Once Daily Glucose Check  Allergies (verified): No Known Drug Allergies  Physical Exam  General:  NAD Lungs:  Normal respiratory effort, chest expands symmetrically. Lungs are clear to auscultation, no crackles or wheezes. Heart:  Normal rate and regular rhythm. S1 and S2 normal without gallop, murmur, click, rub or other extra sounds.  Radial pulses normal and equal. Extremities:  No edema   Impression & Recommendations:  Problem # 1:  URINALYSIS, ABNORMAL (ICD-791.9) Urine culture sent yesterday pending--suspect will be negative  Problem # 2:   DIABETIC MACULAR EDEMA (ICD-362.07) Per Dr. Ricki Miller  Problem # 3:  HYPERLIPIDEMIA (ICD-272.4) Starting Zetia Asked pt. to continue to work on diet and exercise. Her updated medication list for this problem includes:    Crestor 40 Mg Tabs (Rosuvastatin calcium) .Marland Kitchen... 1 tab by mouth daily  Problem # 4:  ESSENTIAL HYPERTENSION (ICD-401.9) BP up. Switching Amlodipine over to [facility] and will be better able to tell if taking meds regularly. Pt.  has not been taking med as she thinks she is in past. Her updated medication list for this problem includes:    Lisinopril-hydrochlorothiazide 20-25 Mg Tabs (Lisinopril-hydrochlorothiazide) .Marland Kitchen... 1 tab by mouth daily    Labetalol Hcl 100 Mg Tabs (Labetalol hcl) .Marland Kitchen... 1 1/2  tabs by mouth two times a day    Lisinopril 20 Mg Tabs (Lisinopril) .Marland Kitchen... 1 tab by mouth daily with lisinopril/hctz    Amlodipine Besylate 10 Mg Tabs (Amlodipine besylate) .Marland Kitchen... 1 tab by mouth daily  Problem # 5:  DIABETES MELLITUS, TYPE II, UNCONTROLLED (ICD-250.02) To bring in sugars in 2 weeks to see if needs to return to Glipizide and/or Actos. The following medications were removed from the medication list:    Actos 30 Mg Tabs (Pioglitazone hcl) .Marland Kitchen... 1 by mouth once daily    Glipizide 10 Mg Tabs (Glipizide) .Marland Kitchen... 1 by mouth two times a day Her updated medication list for this problem includes:    Metformin Hcl 1000 Mg Tabs (Metformin hcl) .Marland Kitchen... 1 by mouth two times a day    Lisinopril-hydrochlorothiazide 20-25 Mg Tabs (Lisinopril-hydrochlorothiazide) .Marland Kitchen... 1 tab by mouth daily    Lisinopril 20 Mg Tabs (Lisinopril) .Marland Kitchen... 1 tab by mouth daily with lisinopril/hctz    Byetta 5 Mcg Pen 5 Mcg/0.35ml Soln (Exenatide) .Marland KitchenMarland KitchenMarland KitchenMarland Kitchen 5 micrograms injected two times a day before meals    Aspirin 81 Mg Tbec (Aspirin) .Marland Kitchen... 1 tab by mouth with meal  Problem # 6:  STROKE, OLD LEFT INTERNAL CAPSULE (ICD-434.91) INcidental finding in hospital Control risk factors. Her updated medication  list for this problem includes:    Plavix 75 Mg Tabs (Clopidogrel bisulfate) ..... Once a day    Aspirin 81 Mg Tbec (Aspirin) .Marland Kitchen... 1 tab by mouth with meal  Problem # 7:  Preventive Health Care (ICD-V70.0) Flu vaccine  Complete Medication List: 1)  Metformin Hcl 1000 Mg Tabs (Metformin hcl) .Marland Kitchen.. 1 by mouth two times a day 2)  Lisinopril-hydrochlorothiazide 20-25 Mg Tabs (Lisinopril-hydrochlorothiazide) .Marland Kitchen.. 1 tab by mouth daily 3)  Labetalol Hcl 100 Mg Tabs (Labetalol hcl) .Marland Kitchen.. 1 1/2  tabs by mouth two times a day 4)  Lisinopril 20 Mg Tabs (Lisinopril) .Marland Kitchen.. 1 tab by mouth daily with lisinopril/hctz 5)  Crestor 40 Mg Tabs (Rosuvastatin calcium) .Marland Kitchen.. 1 tab by mouth  daily 6)  Byetta 5 Mcg Pen 5 Mcg/0.72ml Soln (Exenatide) .... 5 micrograms injected two times a day before meals 7)  Plavix 75 Mg Tabs (Clopidogrel bisulfate) .... Once a day 8)  Ferrous Sulfate 325 (65 Fe) Mg Tabs (Ferrous sulfate) .Marland Kitchen.. 1 tab by mouth daily 9)  Folic Acid 1 Mg Tabs (Folic acid) .Marland Kitchen.. 1 tab by mouth daily 10)  Amlodipine Besylate 10 Mg Tabs (Amlodipine besylate) .Marland Kitchen.. 1 tab by mouth daily 11)  Aspirin 81 Mg Tbec (Aspirin) .Marland Kitchen.. 1 tab by mouth with meal 12)  Bd Ultra-fine Pen Needles 31 G 5 Mm  .... For two times a day injections of byetta 13)  Macrobid 100 Mg Caps (Nitrofurantoin monohyd macro) .Marland Kitchen.. 1 cap by mouth two times a day for 7 days. 14)  Wavesense Presto W/device Kit (Blood glucose monitoring suppl) .... Once daily glucose testing 15)  Wavesense Presto Test Strp (Glucose blood) .... Once daily glucose check 16)  Wavesense Ultra-thin Lancets Misc (Lancets) .... Once daily glucose check  Other Orders: Flu Vaccine 17yrs + [id number]) Admin 1st Vaccine [id number])  Patient Instructions: 1)  Bring in sugars  to office in 2 weeks--bp check then as well.--to decide if need to get back on medication 2)  keep Jan appt. Prescriptions: FOLIC ACID 1 MG TABS (FOLIC ACID) 1 tab by mouth daily  #30 x 11   Entered and  Authorized by:   Mack Hook MD   Signed by:   Mack Hook MD on 01/13/2010   Method used:   Faxed to ...       [facility] (retail)       [place], [place]  [address]       Ph: [phone]       Fax: [phone]   RxID:   [id number] BYETTA 5 MCG PEN 5 MCG/0.02ML SOLN (EXENATIDE) 5 micrograms injected two times a day before meals  #1 month x 11   Entered and Authorized by:   Mack Hook MD   Signed by:   Mack Hook MD on 01/13/2010   Method used:   Faxed to ...       [facility] (retail)       [place], [place]  [address]       Ph: [phone]       Fax: [phone]   RxID:   [id number] CRESTOR 40 MG TABS (ROSUVASTATIN CALCIUM) 1 tab by mouth daily  #30 x 11   Entered and Authorized by:   Mack Hook MD   Signed by:   Mack Hook MD on 01/13/2010   Method used:   Faxed to ...       [facility] (retail)       [place], [place]  [address]       Ph: [phone]       Fax: [phone]   RxID:   [id number] LISINOPRIL 20 MG TABS (LISINOPRIL) 1 tab by mouth daily with Lisinopril/HCTZ  #30 x 11   Entered and Authorized by:   Mack Hook MD   Signed by:   Mack Hook MD on 01/13/2010   Method used:   Faxed to ...       [facility] (retail)       [address]  [address], [place]  [address]       Ph: [phone]       Fax: [phone]   RxID:   [id number] LABETALOL HCL 100 MG TABS (LABETALOL HCL) 1 1/2  tabs by mouth two times a day  #90 x 11   Entered and Authorized by:   Mack Hook MD   Signed by:   Mack Hook MD on 01/13/2010   Method used:   Faxed to ...       [facility] (retail)       [place], [place]   [address]       Ph: [phone] [place]       Fax: [phone]   RxID:   [id number] LISINOPRIL-HYDROCHLOROTHIAZIDE 20-25 MG TABS (LISINOPRIL-HYDROCHLOROTHIAZIDE) 1 tab by mouth daily  #30 x 11   Entered and Authorized by:   Mack Hook MD   Signed by:   Mack Hook MD on 01/13/2010   Method used:   Faxed to ...       [facility] (retail)       [place], [place]  [address]       Ph: [phone] [place]       Fax: [phone]   RxID:   [id number] METFORMIN HCL 1000 MG TABS (METFORMIN HCL) 1 by mouth two times a day  #60 x 11   Entered and Authorized by:   Mack Hook MD   Signed by:   Mack Hook MD on 01/13/2010   Method used:   Faxed to ...       [facility] (retail)       [place], [place]  [address]       Ph: [phone] [place]       Fax: [phone]   RxID:   [id number] AMLODIPINE BESYLATE 10 MG TABS (AMLODIPINE BESYLATE) 1 tab by mouth daily  #30 x 11   Entered and Authorized by:   Mack Hook MD   Signed by:   Mack Hook MD on 01/13/2010   Method used:   Faxed to ...       [facility] (retail)       [place], [place]  [address]       Ph: [phone] [place]       Fax: [phone]   RxID:   [id number]    Orders Added: 1)  Flu Vaccine 34yrs + [id number] 2)  Admin 1st Vaccine [id number] 3)  Est. Patient Level IV [id number]   Immunizations Administered:  Influenza Vaccine # 1:    Vaccine Type: Fluvax 3+    Site: left deltoid    Mfr: GlaxoSmithKline    Dose: 0.5 ml    Route: IM    Given by: Oswaldo Conroy, CMA    Exp. Date: 09/05/2010    Lot #: [id number]    VIS given: 09/30/09 version given January 13, 2010.  Flu Vaccine Consent Questions:    Do you have a history of severe allergic reactions to this vaccine? no    Any prior history of allergic reactions  to egg and/or gelatin? no    Do you have a sensitivity to the preservative Thimersol? no    Do you have a past  history of Guillan-Barre Syndrome? no    Do you currently have an acute febrile illness? no    Have you ever had a severe reaction to latex? no    Vaccine information given and explained to patient? yes    Are you currently pregnant? no   Immunizations Administered:  Influenza Vaccine # 1:    Vaccine Type: Fluvax 3+    Site: left deltoid    Mfr: GlaxoSmithKline    Dose: 0.5 ml    Route: IM    Given by: Oswaldo Conroy, CMA    Exp. Date: 09/05/2010    Lot #: [id number]    VIS given: 09/30/09 version given January 13, 2010.

## 2010-04-09 NOTE — Letter (Signed)
[facility]: [facility]   Imported By: Roland Earl 03/16/2010 16:48:38  _____________________________________________________________________  External Attachment:    Type:   Image     Comment:   External Document

## 2010-04-09 NOTE — Progress Notes (Signed)
Phone Note Call from Patient   Summary of Call: NEEDS METER TO CK BLOOD SUGAR EVERY DAY /SHE WAS IN [place] 3 DAYS/ALSO NEEDS TO FOLLOW UP WITH MULBERRY  SOONER THAN DEC 1,12//PLEASE CALL [phone]//NEEDS METER TODAY/ GOT THE STRIPS Initial call taken by: Roland Earl,  January 06, 2010 12:14 PM  Follow-up for Phone Call        Allen SAYS THAT SHE MISPLACED HER METER, AND NEEDS A NEW ONE. Follow-up by: Roberto Scales,  January 06, 2010 5:20 PM  Additional Follow-up for Phone Call Additional follow up Details #1::        forward to provider Additional Follow-up by: [place] Shannon,  January 07, 2010 1:48 PM    Additional Follow-up for Phone Call Additional follow up Details #2::    Rx for meter and supplies sent to [facility] She was hospitalized for hypoglycemia--please make sure she is not taking her Actos any longer and get a history of what her sugars have been running the past few days.  Mack Hook MD  January 08, 2010 2:27 PM    pt is aware of sugars and she is not taking actos... pt says she did not get the cipro med from the [facility] because she went into the hospital but they told her she did not have an uti while she was hospitalized pt wants to know do she still have to pick up med.... pt also said that the hospital the doctor wanted her to f/u here in the office in the next two weeks but you don't have any openings til dec without double blocking.[place] Shannon  January 09, 2010 8:37 AM Pt. seen yesterday  Mack Hook MD  January 14, 2010 11:26 AM    New/Updated Medications: WAVESENSE PRESTO W/DEVICE KIT (BLOOD GLUCOSE MONITORING SUPPL) once daily glucose testing WAVESENSE PRESTO TEST  STRP (GLUCOSE BLOOD) once daily glucose check WAVESENSE ULTRA-THIN LANCETS  MISC (LANCETS) once daily glucose check Prescriptions: WAVESENSE ULTRA-THIN LANCETS  MISC (LANCETS) once daily glucose check  #30 x 11   Entered and  Authorized by:   Mack Hook MD   Signed by:   Mack Hook MD on 01/08/2010   Method used:   Faxed to ...       [facility] (retail)       [place], [place]  [address]       Ph: [phone] [place]       Fax: [phone]   RxID:   [id number] WAVESENSE PRESTO TEST  STRP (GLUCOSE BLOOD) once daily glucose check  #30 x 11   Entered and Authorized by:   Mack Hook MD   Signed by:   Mack Hook MD on 01/08/2010   Method used:   Faxed to ...       [facility] (retail)       [place], [place]  [address]       Ph: [phone] [place]       Fax: [phone]   RxID:   [id number] WAVESENSE PRESTO W/DEVICE KIT (BLOOD GLUCOSE MONITORING SUPPL) once daily glucose testing  #1 x 0   Entered and Authorized by:   Mack Hook MD   Signed by:   Mack Hook MD on 01/08/2010   Method used:   Faxed to ...       [facility] (retail)       [address] [place], [place]  [address]       Ph: [phone]       Fax: [phone]   RxID:   [id number]

## 2010-04-09 NOTE — Letter (Signed)
Summary: [facility]   Imported By: Roland Earl 12/10/2009 12:25:08  _____________________________________________________________________  External Attachment:    Type:   Image     Comment:   External Document

## 2010-04-09 NOTE — Progress Notes (Signed)
Summary: needs new rx for byetta  Phone Note Outgoing Call   Summary of Call: Krystal Eaton at [facility] - states that company was going to send out another voucher for [employer].  Mariann Laster is going to call the company and get back to me. Initial call taken by: Sherian Maroon RN,  October 30, 2009 10:53 AM  Follow-up for Phone Call        Mariann Laster is going to call pt. and find out what pharmacy she uses and have company fax voucher/Rx directly to that pharmacy. Pt. can just pick up the pens without worrying about the voucher. Follow-up by: Sherian Maroon RN,  October 30, 2009 11:13 AM  Additional Follow-up for Phone Call Additional follow up Details #1::        [facility] [address] needs new rx for byetta to go with the voucher they received. Additional Follow-up by: Shellia Carwin CMA,  October 31, 2009 10:56 AM    Additional Follow-up for Phone Call Additional follow up Details #2::    per mulberry called med into pharmacy the same way it was prescribe previously.... that is done.... [place] Shannon  October 31, 2009 3:53 PM   New/Updated Medications: * BD ULTRA-FINE PEN NEEDLES  31 G 5 MM for two times a day injections of Byetta Prescriptions: BD ULTRA-FINE PEN NEEDLES  31 G 5 MM for two times a day injections of Byetta  #60 x 11   Entered by:   Sherian Maroon RN   Authorized by:   Mack Hook MD   Signed by:   Sherian Maroon RN on 10/31/2009   Method used:   Print then Give to Patient   RxID:   [id number]

## 2010-04-09 NOTE — Letter (Signed)
Summary: DR.BREWINGTON//OPHTHALMOLOGY  DR.BREWINGTON//OPHTHALMOLOGY   Imported By: Roland Earl 02/06/2010 12:52:39  _____________________________________________________________________  External Attachment:    Type:   Image     Comment:   External Document

## 2010-04-09 NOTE — Letter (Signed)
Summary: [facility]   Imported By: Roland Earl 10/01/2009 14:56:01  _____________________________________________________________________  External Attachment:    Type:   Image     Comment:   External Document

## 2010-04-09 NOTE — Letter (Signed)
[facility]   Imported By: Roland Earl 03/16/2010 16:47:19  _____________________________________________________________________  External Attachment:    Type:   Image     Comment:   External Document

## 2010-04-09 NOTE — Progress Notes (Signed)
Summary: Clarifying meds with pt. and pharm  Phone Note Outgoing Call   Summary of Call: Called [facility] pharmacy:  pt. only fills Amlodipine there--does get 90 day supply.  Last filled 10/03/09 fo 90 days, but long gap after previous fill on 02/18/09 for 90 days--basically went 90 days without. Called [facility] pharmacy:  she has since picked up glucotrol recently after no fill since December, refusing Metformin, never filled Byetta--they are sending out new vouchers as she did not remember receiving.  Has not filled Crestor since December.  Has filled both Labetalol and Lisinopril combo only twice since December. Called and spoke with pt--she is adamant that she has had a lot of leftover meds that she is continuing to take.  Discussed my concern with her elevated bp and that she may not be taking everything properly  Tiffany--please call her and set up nurse visit for next Thursday when I am back in office to have a bp check and go through all of her meds to make sure she does have meds. Will discuss at visit in September  Initial call taken by: Mack Hook MD,  October 22, 2009 9:26 AM  Follow-up for Phone Call        appt made and pt aware. Follow-up by: Shellia Carwin CMA,  October 22, 2009 12:31 PM

## 2010-04-09 NOTE — Assessment & Plan Note (Signed)
Summary: bp check /tmm  Nurse Visit   Vital Signs:  Patient profile:   65 year old female Menstrual status:  postmenopausal Weight:      194 pounds BMI:     31.43 BSA:     1.98 Temp:     97.3 degrees F oral Pulse rate:   68 / minute Pulse rhythm:   regular Resp:     20 per minute BP sitting:   136 / 69  (left arm) Cuff size:   regular  Nutrition Counseling: Patient's BMI is greater than 25 and therefore counseled on weight management options.  Patient Instructions: 1)  Blood pressure was good - reviewed with Dr. Amil Amen. 2)  Continue taking medications as ordered.  Make sure you drink plenty of fluids, especially if you go out in the heat. 3)  See provider as scheduled. 4)  Call if anything changes or you have questions.   CC:  f/u BP and to check meds.  History of Present Illness: In office for f/u BP check and medication review to see what she is taking currently.  Not taking Skelaxin or Byetta pens -- have not received.  [facility] was supposed to send something in the mail for her to take her Rx to another pharmacy.   Review of Systems  The patient denies chest pain, syncope, peripheral edema, prolonged cough, and headaches.         States that she had some dizziness from the heat.   Impression & Recommendations:  Problem # 1:  ESSENTIAL HYPERTENSION (ICD-401.9)  Her updated medication list for this problem includes:    Lisinopril-hydrochlorothiazide 20-25 Mg Tabs (Lisinopril-hydrochlorothiazide) .Marland Kitchen... 1 tab by mouth daily    Labetalol Hcl 100 Mg Tabs (Labetalol hcl) .Marland Kitchen... 1 1/2  tabs by mouth two times a day    Lisinopril 20 Mg Tabs (Lisinopril) .Marland Kitchen... 1 tab by mouth daily with lisinopril/hctz    Amlodipine Besylate 10 Mg Tabs (Amlodipine besylate)  Complete Medication List: 1)  Metformin Hcl 1000 Mg Tabs (Metformin hcl) .Marland Kitchen.. 1 by mouth two times a day 2)  Actos 30 Mg Tabs (Pioglitazone hcl) .Marland Kitchen.. 1 by mouth once daily 3)  Skelaxin 800 Mg Tabs  (Metaxalone) .Marland Kitchen.. 1 by mouth once daily 4)  Lisinopril-hydrochlorothiazide 20-25 Mg Tabs (Lisinopril-hydrochlorothiazide) .Marland Kitchen.. 1 tab by mouth daily 5)  Labetalol Hcl 100 Mg Tabs (Labetalol hcl) .Marland Kitchen.. 1 1/2  tabs by mouth two times a day 6)  Glipizide 10 Mg Tabs (Glipizide) .Marland Kitchen.. 1 by mouth two times a day 7)  Lisinopril 20 Mg Tabs (Lisinopril) .Marland Kitchen.. 1 tab by mouth daily with lisinopril/hctz 8)  Crestor 40 Mg Tabs (Rosuvastatin calcium) .Marland Kitchen.. 1 tab by mouth daily 9)  Byetta 5 Mcg Pen 5 Mcg/0.36ml Soln (Exenatide) .... 5 micrograms injected two times a day before meals 10)  Plavix 75 Mg Tabs (Clopidogrel bisulfate) .... Once a day 11)  Ferrous Sulfate 325 (65 Fe) Mg Tabs (Ferrous sulfate) .Marland Kitchen.. 1 tab by mouth daily 12)  Folic Acid 1 Mg Tabs (Folic acid) .Marland Kitchen.. 1 tab by mouth daily 13)  Amlodipine Besylate 10 Mg Tabs (Amlodipine besylate) 14)  Aspirin 81 Mg Tbec (Aspirin) .Marland Kitchen.. 1 tab by mouth with meal  CC: f/u BP and to check meds Is Patient Diabetic? Yes Did you bring your meter with you today? No Pain Assessment Patient in pain? no       Does patient need assistance? Functional Status Self care Ambulation Normal   Allergies: No Known Drug Allergies  Orders Added: 1)  Est. Patient Level I [id number]

## 2010-04-09 NOTE — Letter (Signed)
Summary: External Other  External Other   Imported By: Roland Earl 11/28/2009 14:33:37  _____________________________________________________________________  External Attachment:    Type:   Image     Comment:   External Document

## 2010-04-09 NOTE — Progress Notes (Signed)
Summary: MEDICATION QUESTIONS/  Phone Note Call from Patient   Summary of Call: THE PT NEEDS TO SPEAK ABOUT THE LIST OF HER MEDICATIONS. MULBERRY MD Initial call taken by: Alexis Goodell,  October 14, 2009 9:59 AM  Follow-up for Phone Call        Left message on answering machine for pt. to return call.  Sherian Maroon RN  October 14, 2009 11:58 AM  Pt. wanted to clarify number of supplements she was taking; wanted to make sure she wasn't taking more than one type of iron.  Went over medication list with her and advised she only has folic acid and ferrous sulfate as supplements.  No further questions.  Wanted provider to know she takes meds as ordered; receives from both retail and mail-order (Crestor). Follow-up by: Sherian Maroon RN,  October 14, 2009 4:45 PM

## 2010-04-09 NOTE — Assessment & Plan Note (Signed)
Summary: 1 MONTH FU ON DIABETES//KT   Vital Signs:  Patient profile:   65 year old female Menstrual status:  postmenopausal Weight:      195 pounds Temp:     98.1 degrees F Pulse rate:   62 / minute Pulse rhythm:   regular Resp:     20 per minute BP sitting:   192 / 70  (right arm)  Vitals Entered By: Shellia Carwin CMA (Jul 24, 2009 4:06 PM) CC: f/u diabetes Is Patient Diabetic? Yes Pain Assessment Patient in pain? no      CBG Result 106  Does patient need assistance? Ambulation Normal   CC:  f/u diabetes.  History of Present Illness: 1.  Compliance issues:  pt. misses medication a couple times weekly because she may fall asleep and forget or because she just doesn't feel right.  She gets her Amlodipine Rx from Dr. Corwin Levins at [facility] 3 months at a time and it is cheaper.  Missed her meds last night--includes the Amlodipine.  2.  Hypertension:  as above--still some of a compliance issue. Blood pressure when had recent vascular procedure performed was better than today--cannot recall measurements--but later states" had to stay an extra day in the hospital as her bp was too high and she obviously was not missing meds then."  Discharge summary states hypotension--not sure if a typo.  3.  DM:  Still has not gone back to get Byetta.  Sugars fairly well controlled despite this.  4.  PVD:  Recent interventions to LE per Dr. Gwenlyn Found.  Has noted improvement in claudication symptoms, but has not walked very far as of yet.   5.  Anemia:  has been taking iron regularly--2 tabs daily.  No CBC since October.   Allergies (verified): No Known Drug Allergies  Physical Exam  General:  NAD Lungs:  Normal respiratory effort, chest expands symmetrically. Lungs are clear to auscultation, no crackles or wheezes. Heart:  Normal rate and regular rhythm. S1 and S2 normal without gallop, murmur, click, rub or other extra sounds.  Radial pulses normal and equal Extremities:  No  edema   Impression & Recommendations:  Problem # 1:  ESSENTIAL HYPERTENSION (ICD-401.9) Not controlled- switch to Labetalol from Metoprolol. Suspect not taking Norvasc--will call [facility] and check to see when last picked up. Follow BP closely. Does have RAS Her updated medication list for this problem includes:    Lisinopril-hydrochlorothiazide 20-25 Mg Tabs (Lisinopril-hydrochlorothiazide) .Marland Kitchen... 1 tab by mouth daily    Labetalol Hcl 100 Mg Tabs (Labetalol hcl) .Marland Kitchen... 1 tab by mouth two times a day    Lisinopril 20 Mg Tabs (Lisinopril) .Marland Kitchen... 1 tab by mouth daily with lisinopril/hctz    Amlodipine Besylate 10 Mg Tabs (Amlodipine besylate)  Problem # 2:  DIABETES MELLITUS, TYPE II, UNCONTROLLED (ICD-250.02) Control is okay--pt. needs to go into pharmacy and sign ICP papers for Byetta. Her updated medication list for this problem includes:    Metformin Hcl 1000 Mg Tabs (Metformin hcl) .Marland Kitchen... 1 by mouth two times a day    Actos 30 Mg Tabs (Pioglitazone hcl) .Marland Kitchen... 1 by mouth once daily    Lisinopril-hydrochlorothiazide 20-25 Mg Tabs (Lisinopril-hydrochlorothiazide) .Marland Kitchen... 1 tab by mouth daily    Glipizide 10 Mg Tabs (Glipizide) .Marland Kitchen... 1 by mouth two times a day    Lisinopril 20 Mg Tabs (Lisinopril) .Marland Kitchen... 1 tab by mouth daily with lisinopril/hctz    Byetta 5 Mcg Pen 5 Mcg/0.30ml Soln (Exenatide) .Marland KitchenMarland KitchenMarland KitchenMarland Kitchen 5 micrograms injected two  times a day before meals    Aspirin 81 Mg Tbec (Aspirin) .Marland Kitchen... 1 tab by mouth with meal  Complete Medication List: 1)  Metformin Hcl 1000 Mg Tabs (Metformin hcl) .Marland Kitchen.. 1 by mouth two times a day 2)  Actos 30 Mg Tabs (Pioglitazone hcl) .Marland Kitchen.. 1 by mouth once daily 3)  Skelaxin 800 Mg Tabs (Metaxalone) .Marland Kitchen.. 1 by mouth once daily 4)  Lisinopril-hydrochlorothiazide 20-25 Mg Tabs (Lisinopril-hydrochlorothiazide) .Marland Kitchen.. 1 tab by mouth daily 5)  Labetalol Hcl 100 Mg Tabs (Labetalol hcl) .Marland Kitchen.. 1 tab by mouth two times a day 6)  Glipizide 10 Mg Tabs (Glipizide) .Marland Kitchen.. 1 by mouth two times  a day 7)  Lisinopril 20 Mg Tabs (Lisinopril) .Marland Kitchen.. 1 tab by mouth daily with lisinopril/hctz 8)  Crestor 40 Mg Tabs (Rosuvastatin calcium) .Marland Kitchen.. 1 tab by mouth daily 9)  Byetta 5 Mcg Pen 5 Mcg/0.50ml Soln (Exenatide) .... 5 micrograms injected two times a day before meals 10)  Plavix 75 Mg Tabs (Clopidogrel bisulfate) .... Once a day 11)  Ferrous Sulfate 325 (65 Fe) Mg Tabs (Ferrous sulfate) .Marland Kitchen.. 1 tab by mouth daily 12)  Folic Acid 1 Mg Tabs (Folic acid) .Marland Kitchen.. 1 tab by mouth daily 13)  Amlodipine Besylate 10 Mg Tabs (Amlodipine besylate) 14)  Aspirin 81 Mg Tbec (Aspirin) .Marland Kitchen.. 1 tab by mouth with meal  Other Orders: Capillary Blood Glucose/CBG [id number])  Patient Instructions: 1)  Fasting lab visit in 1 week for :  FLP, CMET, CBC, Urine microalbumin and bp plus pulse in 1 week.   2)  To call if light headed or extreme fatigue 3)  Check bp at home twice daily 4)  Stop Metoprolol when you start Labetalol. 5)  Follow up with Dr. Amil Amen in 4 months --CPP Prescriptions: LABETALOL HCL 100 MG TABS (LABETALOL HCL) 1 tab by mouth two times a day  #60 x 4   Entered and Authorized by:   Mack Hook MD   Signed by:   Mack Hook MD on 07/24/2009   Method used:   Faxed to ...       [facility] (retail)       [place], [place]  [address]       Ph: [phone] [place]       Fax: [phone]   RxID:   [id number]   Laboratory Results   Blood Tests     HGBA1C: 7.2%   (Normal Range: Non-Diabetic - 3-6%   Control Diabetic - 6-8%) CBG Random:: 106mg /dL

## 2010-04-09 NOTE — Assessment & Plan Note (Signed)
Summary: follow up with Dr Amil Amen in 4 months//gk   Vital Signs:  Patient profile:   65 year old female Menstrual status:  postmenopausal Weight:      190.8 pounds Pulse rate:   65 / minute Pulse rhythm:   regular Resp:     18 per minute BP sitting:   179 / 70  (left arm) Cuff size:   regular  Vitals Entered By: Sharon Seller  (May 15, 2009 3:41 PM) CC: Pt here for 65mth followup on DM and BP. Pt states her BP has been running around 190, pt states she did miss two days of taking her BP meds. Because she was experiencing a very loud heartbeat, like she could hear it in her ears. Is Patient Diabetic? Yes Did you bring your meter with you today? No Pain Assessment Patient in pain? no      CBG Result 271  Does patient need assistance? Functional Status Self care Ambulation Normal     Menstrual Status postmenopausal   CC:  Pt here for 47mth followup on DM and BP. Pt states her BP has been running around 190, pt states she did miss two days of taking her BP meds. Because she was experiencing a very loud heartbeat, and like she could hear it in her ears..  History of Present Illness: Pt. brings in all of her meds in bottles from 02/18/09 or earlier.  Many of them have more pills than 2 months worth of dosing.  Pt. states she is getting meds from [facility] as well as [facility]--she did have some prescriptions from 3/10 that were given to her and could have taken to [facility], but her most recent fills have been to [facility].  She may have not realized she needed to discontinue getting refills from [facility] when request came through from [facility].   Basically, she is not sure what she is doing with her meds and has a four separate dosings of different meds daily and is forgetting at times to take some of her meds. Called [facility] pharmacy--last fill of only Toprol was 11/10.  Has not filled anything else for 2 years. Last fill with [facility] was 02/18/09  Allergies  (verified): No Known Drug Allergies  Physical Exam  Lungs:  Normal respiratory effort, chest expands symmetrically. Lungs are clear to auscultation, no crackles or wheezes. Heart:  Normal rate and regular rhythm. S1 and S2 normal without gallop, murmur, click, rub or other extra sounds.  Radial pulses normal and equal   Impression & Recommendations:  Problem # 1:  ESSENTIAL HYPERTENSION (ICD-401.9) Not controlled. All bottles she brought in today, which included one bottle for each of her meds, were labelled with when to take. All to be taken with breakfast or dinner to simplify dosing instead of 4 separate times daily. She is to empty into individually marked baggies any extra over 1 month's dosing from each bottle and follow up in 1 month's time to see what she is doing. She will empty out current pill box and fill according to directions on bottle each week starting tomorrow, Friday. Her updated medication list for this problem includes:    Lisinopril-hydrochlorothiazide 20-25 Mg Tabs (Lisinopril-hydrochlorothiazide) .Marland Kitchen... 1 tab by mouth daily    Metoprolol Tartrate 100 Mg Tabs (Metoprolol tartrate) .Marland Kitchen... 1 1/2 by mouth once daily    Lisinopril 20 Mg Tabs (Lisinopril) .Marland Kitchen... 1 tab by mouth daily with lisinopril/hctz    Amlodipine Besylate 10 Mg Tabs (Amlodipine besylate)  Problem # 2:  DIABETES MELLITUS, TYPE II, UNCONTROLLED (ICD-250.02) As above Her updated medication list for this problem includes:    Metformin Hcl 1000 Mg Tabs (Metformin hcl) .Marland Kitchen... 1 by mouth two times a day    Actos 30 Mg Tabs (Pioglitazone hcl) .Marland Kitchen... 1 by mouth once daily    Lisinopril-hydrochlorothiazide 20-25 Mg Tabs (Lisinopril-hydrochlorothiazide) .Marland Kitchen... 1 tab by mouth daily    Glipizide 10 Mg Tabs (Glipizide) .Marland Kitchen... 1 by mouth two times a day    Lisinopril 20 Mg Tabs (Lisinopril) .Marland Kitchen... 1 tab by mouth daily with lisinopril/hctz    Byetta 5 Mcg Pen 5 Mcg/0.98ml Soln (Exenatide) .Marland KitchenMarland KitchenMarland KitchenMarland Kitchen 5 micrograms injected two  times a day before meals    Aspirin 81 Mg Tbec (Aspirin) .Marland Kitchen... 1 tab by mouth with meal  Complete Medication List: 1)  Metformin Hcl 1000 Mg Tabs (Metformin hcl) .Marland Kitchen.. 1 by mouth two times a day 2)  Actos 30 Mg Tabs (Pioglitazone hcl) .Marland Kitchen.. 1 by mouth once daily 3)  Skelaxin 800 Mg Tabs (Metaxalone) .Marland Kitchen.. 1 by mouth once daily 4)  Lisinopril-hydrochlorothiazide 20-25 Mg Tabs (Lisinopril-hydrochlorothiazide) .Marland Kitchen.. 1 tab by mouth daily 5)  Metoprolol Tartrate 100 Mg Tabs (Metoprolol tartrate) .Marland Kitchen.. 1 1/2 by mouth once daily 6)  Glipizide 10 Mg Tabs (Glipizide) .Marland Kitchen.. 1 by mouth two times a day 7)  Lisinopril 20 Mg Tabs (Lisinopril) .Marland Kitchen.. 1 tab by mouth daily with lisinopril/hctz 8)  Crestor 40 Mg Tabs (Rosuvastatin calcium) .Marland Kitchen.. 1 tab by mouth daily 9)  Naproxen 250 Mg Tabs (Naproxen) .... 2 by mouth two times a day for knee pain 10)  Byetta 5 Mcg Pen 5 Mcg/0.36ml Soln (Exenatide) .... 5 micrograms injected two times a day before meals 11)  Plavix 75 Mg Tabs (Clopidogrel bisulfate) .... Once a day 12)  Ferrous Sulfate 325 (65 Fe) Mg Tabs (Ferrous sulfate) .Marland Kitchen.. 1 tab by mouth daily 13)  Folic Acid 1 Mg Tabs (Folic acid) .Marland Kitchen.. 1 tab by mouth daily 14)  Amlodipine Besylate 10 Mg Tabs (Amlodipine besylate) 15)  Aspirin 81 Mg Tbec (Aspirin) .Marland Kitchen.. 1 tab by mouth with meal  Other Orders: Capillary Blood Glucose/CBG [id number]) Capillary Blood Glucose/CBG [id number])  Patient Instructions: 1)  Follow up with Dr. Amil Amen in 1 month    Vital Signs:  Patient profile:   65 year old female Menstrual status:  postmenopausal Weight:      190.8 pounds Pulse rate:   65 / minute Pulse rhythm:   regular Resp:     18 per minute BP sitting:   179 / 70  (left arm) Cuff size:   regular  Vitals Entered By: Sharon Seller  (May 15, 2009 3:41 PM)

## 2010-04-10 NOTE — Letter (Signed)
Summary: THE [place]   Imported By: Roland Earl 01/22/2010 16:44:30  _____________________________________________________________________  External Attachment:    Type:   Image     Comment:   External Document

## 2010-04-10 NOTE — Letter (Signed)
Summary: BLOOD SUGAR FOR 2 WEEKS  BLOOD SUGAR FOR 2 WEEKS   Imported By: Roland Earl 01/27/2010 14:35:55  _____________________________________________________________________  External Attachment:    Type:   Image     Comment:   External Document

## 2010-04-27 ENCOUNTER — Telehealth (INDEPENDENT_AMBULATORY_CARE_PROVIDER_SITE_OTHER): Payer: Self-pay | Admitting: Internal Medicine

## 2010-05-13 ENCOUNTER — Encounter (INDEPENDENT_AMBULATORY_CARE_PROVIDER_SITE_OTHER): Payer: Self-pay | Admitting: Internal Medicine

## 2010-05-14 NOTE — Progress Notes (Signed)
Summary: Prescriptions changed to [facility]  Phone Note Call from Patient Call back at Home Phone [phone]   Reason for Call: Refill Medication Summary of Call: Yvonne Patel PT. MS Sida CALLED TO SAYS THAT SHE NEEDS REFILLS ON ALL HER MEDICATIONS AND THAT SHE HAS MEDICARE  NOW AND WILL BE USING [facility]. THERE NUMBER IS [phone] OR [phone] Initial call taken by: Roberto Scales,  April 27, 2010 9:55 AM  Follow-up for Phone Call        Do you want to fax new Rx, or just have her current Rx called in to [facility] (transfer from current Rx)?  Sherian Maroon RN  April 27, 2010 11:44 AM   Additional Follow-up for Phone Call Additional follow up Details #1::        Go ahead and fax new--fill through November of 2012 please Additional Follow-up by: Mack Hook MD,  April 27, 2010 5:35 PM    Additional Follow-up for Phone Call Additional follow up Details #2::    Meds refilled and faxed to [place] RN  May 05, 2010 4:14 PM   Prescriptions: WAVESENSE ULTRA-THIN LANCETS  MISC (LANCETS) once daily glucose check  #30 x 8   Entered by:   Bridgett Larsson RN   Authorized by:   Mack Hook MD   Signed by:   Bridgett Larsson RN on 05/05/2010   Method used:   Print then Give to Patient   RxID:   [id number] [place] TEST  STRP (GLUCOSE BLOOD) once daily glucose check  #30 x 8   Entered by:   Bridgett Larsson RN   Authorized by:   Mack Hook MD   Signed by:   Bridgett Larsson RN on 05/05/2010   Method used:   Print then Give to Patient   RxID:   [id number] BD ULTRA-FINE PEN NEEDLES  31 G 5 MM for two times a day injections of Byetta  #60 x 8   Entered by:   Bridgett Larsson RN   Authorized by:   Mack Hook MD   Signed by:   Bridgett Larsson RN on 05/05/2010   Method used:   Print then Give to Patient   RxID:   [id number] AMLODIPINE BESYLATE 10 MG TABS (AMLODIPINE  BESYLATE) 1 tab by mouth daily  #30 x 8   Entered by:   Bridgett Larsson RN   Authorized by:   Mack Hook MD   Signed by:   Bridgett Larsson RN on 05/05/2010   Method used:   Print then Give to Patient   RxID:   [id number] FOLIC ACID 1 MG TABS (FOLIC ACID) 1 tab by mouth daily  #30 x 8   Entered by:   Bridgett Larsson RN   Authorized by:   Mack Hook MD   Signed by:   Bridgett Larsson RN on 05/05/2010   Method used:   Print then Give to Patient   RxID:   [id number] FERROUS SULFATE 325 (65 FE) MG TABS (FERROUS SULFATE) 1 tab by mouth daily  #30 x 8   Entered by:   Bridgett Larsson RN   Authorized by:   Mack Hook MD   Signed by:   Bridgett Larsson RN on 05/05/2010   Method used:   Print then Give to Patient   RxID:   [id number] BYETTA 5 MCG PEN 5 MCG/0.02ML SOLN (EXENATIDE) 5 micrograms injected two times a day before meals  #1 month x 8   Entered  by:   Bridgett Larsson RN   Authorized by:   Mack Hook MD   Signed by:   Bridgett Larsson RN on 05/05/2010   Method used:   Print then Give to Patient   RxID:   [id number] CRESTOR 40 MG TABS (ROSUVASTATIN CALCIUM) 1 tab by mouth daily  #30 x 8   Entered by:   Bridgett Larsson RN   Authorized by:   Mack Hook MD   Signed by:   Bridgett Larsson RN on 05/05/2010   Method used:   Print then Give to Patient   RxID:   [id number] LISINOPRIL 20 MG TABS (LISINOPRIL) 1 tab by mouth daily with Lisinopril/HCTZ  #30 x 8   Entered by:   Bridgett Larsson RN   Authorized by:   Mack Hook MD   Signed by:   Bridgett Larsson RN on 05/05/2010   Method used:   Print then Give to Patient   RxID:   [id number] LABETALOL HCL 100 MG TABS (LABETALOL HCL) 1 1/2  tabs by mouth two times a day  #90 x 8   Entered by:   Bridgett Larsson RN   Authorized by:   Mack Hook MD   Signed by:   Bridgett Larsson RN on 05/05/2010   Method used:   Print then Give to Patient   RxID:    [id number] LISINOPRIL-HYDROCHLOROTHIAZIDE 20-25 MG TABS (LISINOPRIL-HYDROCHLOROTHIAZIDE) 1 tab by mouth daily  #30 x 8   Entered by:   Bridgett Larsson RN   Authorized by:   Mack Hook MD   Signed by:   Bridgett Larsson RN on 05/05/2010   Method used:   Print then Give to Patient   RxID:   [id number] METFORMIN HCL 1000 MG TABS (METFORMIN HCL) 1 by mouth two times a day  #60 x 8   Entered by:   Bridgett Larsson RN   Authorized by:   Mack Hook MD   Signed by:   Bridgett Larsson RN on 05/05/2010   Method used:   Print then Give to Patient   RxID:   [id number]

## 2010-05-19 NOTE — Medication Information (Signed)
Summary: RX Folder//[facility]//FAXED  RX Folder//[facility]//FAXED   Imported By: Roland Earl 05/13/2010 11:14:38  _____________________________________________________________________  External Attachment:    Type:   Image     Comment:   External Document

## 2010-05-20 LAB — DIFFERENTIAL
Basophils Relative: 0 % (ref 0–1)
Eosinophils Absolute: 0.1 10*3/uL (ref 0.0–0.7)
Eosinophils Relative: 1 % (ref 0–5)
Monocytes Absolute: 0.4 10*3/uL (ref 0.1–1.0)
Monocytes Relative: 6 % (ref 3–12)
Neutrophils Relative %: 63 % (ref 43–77)

## 2010-05-20 LAB — GLUCOSE, CAPILLARY
Glucose-Capillary: 113 mg/dL — ABNORMAL HIGH (ref 70–99)
Glucose-Capillary: 119 mg/dL — ABNORMAL HIGH (ref 70–99)
Glucose-Capillary: 119 mg/dL — ABNORMAL HIGH (ref 70–99)
Glucose-Capillary: 127 mg/dL — ABNORMAL HIGH (ref 70–99)
Glucose-Capillary: 148 mg/dL — ABNORMAL HIGH (ref 70–99)
Glucose-Capillary: 148 mg/dL — ABNORMAL HIGH (ref 70–99)
Glucose-Capillary: 148 mg/dL — ABNORMAL HIGH (ref 70–99)
Glucose-Capillary: 181 mg/dL — ABNORMAL HIGH (ref 70–99)
Glucose-Capillary: 196 mg/dL — ABNORMAL HIGH (ref 70–99)
Glucose-Capillary: 226 mg/dL — ABNORMAL HIGH (ref 70–99)
Glucose-Capillary: 243 mg/dL — ABNORMAL HIGH (ref 70–99)
Glucose-Capillary: 32 mg/dL — CL (ref 70–99)
Glucose-Capillary: 37 mg/dL — CL (ref 70–99)
Glucose-Capillary: 56 mg/dL — ABNORMAL LOW (ref 70–99)
Glucose-Capillary: 57 mg/dL — ABNORMAL LOW (ref 70–99)
Glucose-Capillary: 76 mg/dL (ref 70–99)
Glucose-Capillary: 87 mg/dL (ref 70–99)
Glucose-Capillary: 89 mg/dL (ref 70–99)

## 2010-05-20 LAB — URINE CULTURE: Culture  Setup Time: 201110301227

## 2010-05-20 LAB — POCT I-STAT, CHEM 8
BUN: 14 mg/dL (ref 6–23)
Calcium, Ion: 1.25 mmol/L (ref 1.12–1.32)
Creatinine, Ser: 1 mg/dL (ref 0.4–1.2)
Glucose, Bld: 114 mg/dL — ABNORMAL HIGH (ref 70–99)
HCT: 40 % (ref 36.0–46.0)
Hemoglobin: 13.3 g/dL (ref 12.0–15.0)
Potassium: 3.7 mEq/L (ref 3.5–5.1)
TCO2: 30 mmol/L (ref 0–100)

## 2010-05-20 LAB — CBC
Hemoglobin: 10.6 g/dL — ABNORMAL LOW (ref 12.0–15.0)
Hemoglobin: 11.6 g/dL — ABNORMAL LOW (ref 12.0–15.0)
MCH: 30.9 pg (ref 26.0–34.0)
MCH: 30.9 pg (ref 26.0–34.0)
MCHC: 33.3 g/dL (ref 30.0–36.0)
MCV: 91.8 fL (ref 78.0–100.0)
Platelets: 149 10*3/uL — ABNORMAL LOW (ref 150–400)
Platelets: 154 10*3/uL (ref 150–400)
RBC: 3.43 MIL/uL — ABNORMAL LOW (ref 3.87–5.11)
RBC: 3.43 MIL/uL — ABNORMAL LOW (ref 3.87–5.11)
RDW: 13.9 % (ref 11.5–15.5)
WBC: 6.3 10*3/uL (ref 4.0–10.5)

## 2010-05-20 LAB — URINALYSIS, ROUTINE W REFLEX MICROSCOPIC
Bilirubin Urine: NEGATIVE
Hgb urine dipstick: NEGATIVE
Ketones, ur: NEGATIVE mg/dL
Protein, ur: 100 mg/dL — AB
Urobilinogen, UA: 0.2 mg/dL (ref 0.0–1.0)

## 2010-05-20 LAB — BASIC METABOLIC PANEL
CO2: 27 mEq/L (ref 19–32)
Calcium: 9.1 mg/dL (ref 8.4–10.5)
Chloride: 103 mEq/L (ref 96–112)
Creatinine, Ser: 1.06 mg/dL (ref 0.4–1.2)
Creatinine, Ser: 1.14 mg/dL (ref 0.4–1.2)
GFR calc Af Amer: 58 mL/min — ABNORMAL LOW (ref >60)
GFR calc Af Amer: >60 mL/min (ref >60)
GFR calc non Af Amer: 48 mL/min — ABNORMAL LOW (ref >60)
GFR calc non Af Amer: 52 mL/min — ABNORMAL LOW (ref >60)
Potassium: 3.7 mEq/L (ref 3.5–5.1)
Sodium: 140 mEq/L (ref 135–145)

## 2010-05-20 LAB — LIPID PANEL
Cholesterol: 169 mg/dL (ref 0–200)
LDL Cholesterol: 104 mg/dL — ABNORMAL HIGH (ref 0–99)
Total CHOL/HDL Ratio: 3.9 RATIO

## 2010-05-20 LAB — URINE MICROSCOPIC-ADD ON

## 2010-05-20 LAB — HEMOGLOBIN A1C: Mean Plasma Glucose: 134 mg/dL — ABNORMAL HIGH (ref <117)

## 2010-05-25 ENCOUNTER — Encounter (INDEPENDENT_AMBULATORY_CARE_PROVIDER_SITE_OTHER): Payer: Self-pay | Admitting: Internal Medicine

## 2010-05-26 LAB — BASIC METABOLIC PANEL
BUN: 26 mg/dL — ABNORMAL HIGH (ref 6–23)
BUN: 30 mg/dL — ABNORMAL HIGH (ref 6–23)
CO2: 26 mEq/L (ref 19–32)
CO2: 27 mEq/L (ref 19–32)
Calcium: 8.9 mg/dL (ref 8.4–10.5)
Creatinine, Ser: 1.06 mg/dL (ref 0.4–1.2)
Glucose, Bld: 120 mg/dL — ABNORMAL HIGH (ref 70–99)
Glucose, Bld: 94 mg/dL (ref 70–99)
Potassium: 3.3 mEq/L — ABNORMAL LOW (ref 3.5–5.1)
Sodium: 138 mEq/L (ref 135–145)

## 2010-05-26 LAB — GLUCOSE, CAPILLARY
Glucose-Capillary: 109 mg/dL — ABNORMAL HIGH (ref 70–99)
Glucose-Capillary: 115 mg/dL — ABNORMAL HIGH (ref 70–99)
Glucose-Capillary: 199 mg/dL — ABNORMAL HIGH (ref 70–99)
Glucose-Capillary: 339 mg/dL — ABNORMAL HIGH (ref 70–99)

## 2010-05-26 LAB — CBC
HCT: 31.6 % — ABNORMAL LOW (ref 36.0–46.0)
Hemoglobin: 10.9 g/dL — ABNORMAL LOW (ref 12.0–15.0)
MCHC: 34.4 g/dL (ref 30.0–36.0)
MCV: 93.3 fL (ref 78.0–100.0)
RDW: 13.4 % (ref 11.5–15.5)

## 2010-06-04 NOTE — Letter (Signed)
Summary: MAILED REQUESTED RECORDS TO DR. Jilda Panda  MAILED REQUESTED RECORDS TO DR. Jilda Panda   Imported By: Roland Earl 05/25/2010 15:47:18  _____________________________________________________________________  External Attachment:    Type:   Image     Comment:   External Document

## 2010-06-09 LAB — BASIC METABOLIC PANEL
BUN: 29 mg/dL — ABNORMAL HIGH (ref 6–23)
CO2: 26 mEq/L (ref 19–32)
CO2: 29 mEq/L (ref 19–32)
Chloride: 106 mEq/L (ref 96–112)
Creatinine, Ser: 1.15 mg/dL (ref 0.4–1.2)
GFR calc non Af Amer: 57 mL/min — ABNORMAL LOW (ref >60)
Glucose, Bld: 58 mg/dL — ABNORMAL LOW (ref 70–99)
Glucose, Bld: 77 mg/dL (ref 70–99)
Potassium: 4 mEq/L (ref 3.5–5.1)
Sodium: 139 mEq/L (ref 135–145)

## 2010-06-09 LAB — CBC
HCT: 32 % — ABNORMAL LOW (ref 36.0–46.0)
Hemoglobin: 11.2 g/dL — ABNORMAL LOW (ref 12.0–15.0)
MCHC: 34.6 g/dL (ref 30.0–36.0)
MCV: 92.4 fL (ref 78.0–100.0)
Platelets: 136 10*3/uL — ABNORMAL LOW (ref 150–400)
RDW: 13.4 % (ref 11.5–15.5)
RDW: 13.6 % (ref 11.5–15.5)
WBC: 5.9 10*3/uL (ref 4.0–10.5)

## 2010-06-09 LAB — GLUCOSE, CAPILLARY
Glucose-Capillary: 218 mg/dL — ABNORMAL HIGH (ref 70–99)
Glucose-Capillary: 48 mg/dL — ABNORMAL LOW (ref 70–99)
Glucose-Capillary: 55 mg/dL — ABNORMAL LOW (ref 70–99)

## 2010-06-09 LAB — APTT: aPTT: 28 seconds (ref 24–37)

## 2010-06-12 LAB — BASIC METABOLIC PANEL
BUN: 21 mg/dL (ref 6–23)
CO2: 28 mEq/L (ref 19–32)
Calcium: 9.2 mg/dL (ref 8.4–10.5)
Chloride: 108 mEq/L (ref 96–112)
Creatinine, Ser: 1.07 mg/dL (ref 0.4–1.2)
GFR calc Af Amer: >60 mL/min (ref >60)
GFR calc non Af Amer: 52 mL/min — ABNORMAL LOW (ref >60)
Glucose, Bld: 106 mg/dL — ABNORMAL HIGH (ref 70–99)
Potassium: 4.1 mEq/L (ref 3.5–5.1)
Sodium: 142 mEq/L (ref 135–145)

## 2010-06-12 LAB — GLUCOSE, CAPILLARY
Glucose-Capillary: 143 mg/dL — ABNORMAL HIGH (ref 70–99)
Glucose-Capillary: 49 mg/dL — ABNORMAL LOW (ref 70–99)
Glucose-Capillary: 99 mg/dL (ref 70–99)

## 2010-06-12 LAB — CBC
HCT: 29.4 % — ABNORMAL LOW (ref 36.0–46.0)
Hemoglobin: 10 g/dL — ABNORMAL LOW (ref 12.0–15.0)
MCHC: 34.2 g/dL (ref 30.0–36.0)
MCV: 92.4 fL (ref 78.0–100.0)
Platelets: 129 10*3/uL — ABNORMAL LOW (ref 150–400)
RBC: 3.18 MIL/uL — ABNORMAL LOW (ref 3.87–5.11)
RDW: 14.1 % (ref 11.5–15.5)
WBC: 6.3 10*3/uL (ref 4.0–10.5)

## 2010-06-14 LAB — GLUCOSE, CAPILLARY: Glucose-Capillary: 109 mg/dL — ABNORMAL HIGH (ref 70–99)

## 2010-07-21 NOTE — Procedures (Signed)
[name], [name]                ACCOUNT NO.:  [id number]   MEDICAL RECORD NO.:  [id number]          PATIENT TYPE:  INP   LOCATION:  2807                         FACILITY:  [facility]   PHYSICIAN:  Quay Burow, M.D.   DATE OF BIRTH:  01/04/1946   DATE OF PROCEDURE:  DATE OF DISCHARGE:                    PERIPHERAL VASCULAR INVASIVE PROCEDURE   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  She quit  smoking many years ago and has an acceptable lipid profile.  She still  has symptomatic claudication and because of this and her common femoral  and SFA disease, she was brought back for attempt at percutaneous  revascularization of her left common femoral artery.  It should be  noted, she has one-vessel runoff on each side via the peroneal and  diffusely diseased SFAs.   PROCEDURE DESCRIPTION:  The patient was brought to the second floor  [facility] in the postabsorptive state.  She was  premedicated with p.o. Valium, IV fentanyl and morphine.  The right  groin was prepped and shaved in the usual sterile fashion.  Xylocaine 1%  was used for local anesthesia.  A 5-French sheath was inserted into the  right femoral artery using standard Seldinger technique.  A 5-French  tennis racquet catheter was used for midstream  abdominal aortography,  as well as pelvic angiography.  Visipaque dye was used for the entirety  of the case.  Retrograde aortic pressure was monitored during the case.   ANGIOGRAPHIC RESULTS:  1. Abdominal aorta:      a.     Renal arteries- 90% mid eccentric calcified left renal       artery stenosis.      b.     Infrarenal abdominal aorta- moderate  atherosclerotic       narrowing.  2. Left lower extremity:      a.     Left common iliac artery stent was widely patent.      b.     An 80% eccentric calcified left common femoral artery       stenosis with a 50 mm pullback gradient.  3. Right lower extremity:      a.     Patent right common iliac artery stent with 30% in-stent re-       stenosis and no pullback gradient noted after administration of       200 mcg of intra-arterial nitroglycerin.      b.     An 80% calcific right common femoral artery stenosis.   PROCEDURE DESCRIPTION:  The patient received a total of 5000 units of  heparin intravenously.  The contralateral access was obtained with the  crossover catheter, Rosen wire and 6-French crossover  sheath.  The  lesion was crossed with an 0.014, 260 Spartacore wire and cutting  balloon atherectomy was performed with a 5  x 4 AngioSculpt with nominal  pressure, followed by a 6 x 2 [employer] cutting balloon at 4  atmospheres, resulting in reduction of 80% stenosis to less than 20%  residual without dissection.  The patient tolerated the procedure well.  It should be noted that she was significant hypertensive during the  procedure and received 10 mg of intravenous Hydralazine which  dramatically dropped her pressure, resulting in lightheadedness,  diaphoresis and chest pain.  Following this, she received a mg of  atropine, 3 mg of morphine and IV fluid resuscitation with gradual  increase in her heart rate, blood pressure and resolution of her  symptoms.  The crossover sheath was the exchanged for a short 6-French  sheath and the patient left the lab in stable condition, hemodynamically  stable and asymptomatic.   She will be hydrated overnight, discharged home in the morning and we  will get follow up Doppler and ABI after which she will see me back in  the office in follow up.      Quay Burow, M.D.  Electronically Signed     JB/MEDQ  D:  02/17/2009  T:   02/17/2009  Job:  [id number]   cc:   [facility]  [facility], Dr.

## 2010-07-21 NOTE — Op Note (Signed)
[name], Yvonne Patel                ACCOUNT NO.:  [id number]   MEDICAL RECORD NO.:  [id number]          PATIENT TYPE:  AMB   LOCATION:  ENDO                         [facility]ACILITY[facility]  [facility]   PHYSICIAN:  Clarene Reamer, MD  DATE OF BIRTH:  1945/06/03   DATE OF PROCEDURE:  DATE OF DISCHARGE:                               OPERATIVE REPORT   INDICATIONS:  A patient with a previous history of polyps who has been  in otherwise good health.  She has no other GI problems of significance.  Indicates that she thought she had polyps in the past.  Her last  colonoscope was 4 to 5 years previously.   Her medical history reveals she has:  1. Diabetes.  2. Hypertension.   PHYSICAL EXAMINATION:  Temperature is 98, blood pressure was 0000000, but  the systolic pressure came down satisfactorily after sedation.  The  pulse was 75 and regular, respirations were 14 and regular.  The oropharynx was negative.  The neck was negative.  The chest was clear to auscultation.  The abdomen was soft.  No masses or organomegaly.  The extremities were unremarkable.  Rectal exam was unremarkable.   PROCEDURE:  The patient was premedicated with 100 mg of fentanyl, 2 mg  of Versed IV.  The Olympus colonoscope was introduced to the rectum  without difficulty.  On retraction, careful inspection of the colon, in  a well-prepped colon revealed some small diverticula in the left colon.  Otherwise, there were no other pathological abnormalities.  No  significant polyps were seen.  The endoscope was retrograded in the  rectum and the anal area was totally normal.  The endoscope was returned  back into the normal position and retracted.  The patient tolerated the  procedure nicely.   IMPRESSION:  Colonoscopy from the rectum to the cecum.  Essentially a  normal examination, except for some very small diverticula in the left  colon, predominantly the sigmoid.  This otherwise, was a normal colon  examination.   I think this  patient should have a repeat colon screening examination  sometime within the next 5-10 years, unless there is evidence of  indicated otherwise.      Clarene Reamer, MD  Electronically Signed     SML/MEDQ  D:  09/17/2008  T:  09/17/2008  Job:  [id number]   cc:   Myriam Jacobson  Fax: [phone]

## 2010-07-21 NOTE — Procedures (Signed)
NAMEBERGEN, JUSTIN NO.:  [id number]   MEDICAL RECORD NO.:  [id number]          PATIENT TYPE:  OIB   LOCATION:  2505                         FACILITY:  [facility]   PHYSICIAN:  Quay Burow, M.D.   DATE OF BIRTH:  08/07/1945   DATE OF PROCEDURE:  DATE OF DISCHARGE:                    PERIPHERAL VASCULAR INVASIVE PROCEDURE   PERIPHERAL ANGIOGRAM/ROTATIONAL ATHERECTOMY/PPA REPORT:   HISTORY OF PRESENT ILLNESS:  [name] is a 65 year old African  American female, divorced mother of 64, grandmother to 74, who has  multiple risk factors including remote tobacco, diabetes, hypertension,  hyperlipidemia.  I stented both of her iliacs.  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  I performed cutting balloon atherectomy on her via retrograde  approach of her left common femoral which resulted in some improvement,  but still with significant claudication and high frequency signal there  because of the calcified vessel.  She presents now for attempt at high  speed rotation atherectomy using high speed orbital atherectomy and  diamond back device.   DESCRIPTION OF PROCEDURE:  The patient was brought to the 2nd floor  [facility] in the postabsorptive state.  She was  premedicated with p.o. valium and IV Fentanyl.  Her right groin was  prepped and shaved in the usual sterile fashion, and 1% Xylocaine was  used for local anesthesia.  A 7-French crossover sheath was inserted  into the right femoral artery using standard Seldinger technique and a  Doppler tip smart needle.  A 3.5 Wholey wire was used along with a 5-  [place] crossover catheter and guidewire.  Visipaque dye was used through  the entirety of the case.  Electric aortic pressures monitored during  the case.  A bolus chase step-table did show subtraction.  Angiogram was  performed of the left lower extremity.   ANGIOGRAPHIC RESULTS:  1.  A 60% calcific concentric distal left common femoral artery      stenosis.  2. Long 60%-70% diffuse left SFA disease with 1 vessel running off      through via the peroneal.  3. The patient received 3000 units of heparin intravenously with base      CO2 of 28.  Using a 14 viper diamond back wire, the lesion was      crossed, wires placed in the SFA.  The 2.25 mm diamond back orbital      rotational atherectomy bur was then advanced over the bifurcation      and fixed runs were performed, 2 at 60,000 RPM's, 2 at 90,000 and 2      at 120,000.  Intra-arterial nitroglycerin 200 mcg was given several      times during the case.  At the end of the case, a low-pressure      touchup DTS performed with a 6.3 balloon at 4 atmospheres      resulting in reduction of a 60% to less than 30% stenosis and a 30      mm trans stenotic gradient less than 10.  There was an incidentally  noted small lunar dissection just distal to the treated lesion      above the SFA profunda bifurcation that appeared to be stable, but      will need to be followed closely.   IMPRESSION:  Successful high speed rotational atherectomy, PTA of a  highly calcified concentric distal left SFA lesion resulting in an  acceptable angiographic and hemodynamic result with a small distal  dissection.  The patient  left the lab in stable condition.  She will be hydrated, treated with  aspirin and Plavix and discharged home in the morning.  She will need  aggressive follow up with Dopplers, but hopefully, the dissection plane  will heal without incident.      Quay Burow, M.D.     Geralynn Rile  D:  07/10/2009  T:  07/10/2009  Job:  [id number]   cc:   2nd [facility]  [facility]  [facility]  Marcelino Duster, M.D.   Electronically Signed by Quay Burow M.D. on 07/22/2009 03:26:51 PM

## 2010-12-16 ENCOUNTER — Other Ambulatory Visit (HOSPITAL_COMMUNITY): Payer: Self-pay | Admitting: Internal Medicine

## 2010-12-16 DIAGNOSIS — Z1231 Encounter for screening mammogram for malignant neoplasm of breast: Secondary | ICD-10-CM

## 2011-01-26 ENCOUNTER — Ambulatory Visit (HOSPITAL_COMMUNITY): Payer: Self-pay

## 2011-02-12 ENCOUNTER — Ambulatory Visit
Admission: RE | Admit: 2011-02-12 | Discharge: 2011-02-12 | Disposition: A | Discharge status: Home / Self Care | Payer: Medicare HMO | Source: Ambulatory Visit | Attending: Internal Medicine | Admitting: Internal Medicine

## 2011-02-12 DIAGNOSIS — Z1231 Encounter for screening mammogram for malignant neoplasm of breast: Secondary | ICD-10-CM

## 2011-09-28 ENCOUNTER — Observation Stay (HOSPITAL_COMMUNITY): Payer: Medicare HMO

## 2011-09-28 ENCOUNTER — Inpatient Hospital Stay (HOSPITAL_COMMUNITY)
Admission: EM | Admit: 2011-09-28 | Discharge: 2011-10-01 | DRG: 065 | Disposition: A | Discharge status: Home Health | Payer: Medicare HMO | Attending: Internal Medicine | Admitting: Internal Medicine

## 2011-09-28 ENCOUNTER — Encounter (HOSPITAL_COMMUNITY): Payer: Self-pay

## 2011-09-28 DIAGNOSIS — R55 Syncope and collapse: Secondary | ICD-10-CM | POA: Diagnosis present

## 2011-09-28 DIAGNOSIS — Z79899 Other long term (current) drug therapy: Secondary | ICD-10-CM

## 2011-09-28 DIAGNOSIS — E11311 Type 2 diabetes mellitus with unspecified diabetic retinopathy with macular edema: Secondary | ICD-10-CM

## 2011-09-28 DIAGNOSIS — E11319 Type 2 diabetes mellitus with unspecified diabetic retinopathy without macular edema: Secondary | ICD-10-CM | POA: Diagnosis present

## 2011-09-28 DIAGNOSIS — Z87891 Personal history of nicotine dependence: Secondary | ICD-10-CM

## 2011-09-28 DIAGNOSIS — Z8673 Personal history of transient ischemic attack (TIA), and cerebral infarction without residual deficits: Secondary | ICD-10-CM

## 2011-09-28 DIAGNOSIS — I639 Cerebral infarction, unspecified: Secondary | ICD-10-CM

## 2011-09-28 DIAGNOSIS — R4781 Slurred speech: Secondary | ICD-10-CM | POA: Insufficient documentation

## 2011-09-28 DIAGNOSIS — E876 Hypokalemia: Secondary | ICD-10-CM | POA: Diagnosis present

## 2011-09-28 DIAGNOSIS — Z7982 Long term (current) use of aspirin: Secondary | ICD-10-CM

## 2011-09-28 DIAGNOSIS — E1149 Type 2 diabetes mellitus with other diabetic neurological complication: Secondary | ICD-10-CM

## 2011-09-28 DIAGNOSIS — Z7902 Long term (current) use of antithrombotics/antiplatelets: Secondary | ICD-10-CM

## 2011-09-28 DIAGNOSIS — N179 Acute kidney failure, unspecified: Secondary | ICD-10-CM | POA: Diagnosis present

## 2011-09-28 DIAGNOSIS — E119 Type 2 diabetes mellitus without complications: Secondary | ICD-10-CM | POA: Diagnosis present

## 2011-09-28 DIAGNOSIS — I6350 Cerebral infarction due to unspecified occlusion or stenosis of unspecified cerebral artery: Principal | ICD-10-CM | POA: Diagnosis present

## 2011-09-28 DIAGNOSIS — I739 Peripheral vascular disease, unspecified: Secondary | ICD-10-CM | POA: Diagnosis present

## 2011-09-28 DIAGNOSIS — E1142 Type 2 diabetes mellitus with diabetic polyneuropathy: Secondary | ICD-10-CM | POA: Diagnosis present

## 2011-09-28 DIAGNOSIS — I671 Cerebral aneurysm, nonruptured: Secondary | ICD-10-CM | POA: Diagnosis present

## 2011-09-28 DIAGNOSIS — N183 Chronic kidney disease, stage 3 unspecified: Secondary | ICD-10-CM | POA: Diagnosis present

## 2011-09-28 DIAGNOSIS — E785 Hyperlipidemia, unspecified: Secondary | ICD-10-CM | POA: Diagnosis present

## 2011-09-28 DIAGNOSIS — E860 Dehydration: Secondary | ICD-10-CM | POA: Diagnosis present

## 2011-09-28 DIAGNOSIS — R9431 Abnormal electrocardiogram [ECG] [EKG]: Secondary | ICD-10-CM

## 2011-09-28 DIAGNOSIS — E1139 Type 2 diabetes mellitus with other diabetic ophthalmic complication: Secondary | ICD-10-CM | POA: Diagnosis present

## 2011-09-28 DIAGNOSIS — E1165 Type 2 diabetes mellitus with hyperglycemia: Secondary | ICD-10-CM | POA: Diagnosis present

## 2011-09-28 DIAGNOSIS — D509 Iron deficiency anemia, unspecified: Secondary | ICD-10-CM | POA: Diagnosis present

## 2011-09-28 DIAGNOSIS — I951 Orthostatic hypotension: Secondary | ICD-10-CM | POA: Diagnosis present

## 2011-09-28 DIAGNOSIS — I10 Essential (primary) hypertension: Secondary | ICD-10-CM | POA: Diagnosis present

## 2011-09-28 HISTORY — DX: Syncope and collapse: R55

## 2011-09-28 HISTORY — DX: Procedure and treatment not carried out because of patient's decision for reasons of belief and group pressure: Z53.1

## 2011-09-28 HISTORY — DX: Peripheral vascular disease, unspecified: I73.9

## 2011-09-28 HISTORY — DX: Myoneural disorder, unspecified: G70.9

## 2011-09-28 HISTORY — DX: Essential (primary) hypertension: I10

## 2011-09-28 HISTORY — DX: Unspecified retinal disorder: H35.9

## 2011-09-28 LAB — CBC
MCH: 31.1 pg (ref 26.0–34.0)
MCV: 91.1 fL (ref 78.0–100.0)
Platelets: 153 10*3/uL (ref 150–400)
RDW: 12.5 % (ref 11.5–15.5)

## 2011-09-28 LAB — BASIC METABOLIC PANEL
BUN: 20 mg/dL (ref 6–23)
CO2: 27 mEq/L (ref 19–32)
Chloride: 97 mEq/L (ref 96–112)
GFR calc non Af Amer: 40 mL/min — ABNORMAL LOW (ref >90)
Glucose, Bld: 266 mg/dL — ABNORMAL HIGH (ref 70–99)
Potassium: 3.3 mEq/L — ABNORMAL LOW (ref 3.5–5.1)

## 2011-09-28 LAB — CARDIAC PANEL(CRET KIN+CKTOT+MB+TROPI)
CK, MB: 3.2 ng/mL (ref 0.3–4.0)
CK, MB: 3.1 ng/mL (ref 0.3–4.0)
Relative Index: INVALID (ref 0.0–2.5)
Total CK: 92 U/L (ref 7–177)
Troponin I: <0.3 ng/mL (ref <0.30)

## 2011-09-28 LAB — GLUCOSE, CAPILLARY: Glucose-Capillary: 292 mg/dL — ABNORMAL HIGH (ref 70–99)

## 2011-09-28 MED ORDER — LIRAGLUTIDE 18 MG/3ML ~~LOC~~ SOLN: 1.2000 mg | Freq: Every day | SUBCUTANEOUS | Status: DC

## 2011-09-28 MED ORDER — SODIUM CHLORIDE 0.9 % IV SOLN
INTRAVENOUS | Status: DC
  Administered 2011-09-28: 18:00:00 via INTRAVENOUS

## 2011-09-28 MED ORDER — INSULIN ASPART 100 UNIT/ML ~~LOC~~ SOLN
0.0000 [IU] | Freq: Three times a day (TID) | SUBCUTANEOUS | Status: DC
  Administered 2011-09-28: 18:00:00 via SUBCUTANEOUS
  Administered 2011-09-29 (×2): 2 [IU] via SUBCUTANEOUS
  Administered 2011-09-30 (×2): 3 [IU] via SUBCUTANEOUS
  Administered 2011-10-01: 2 [IU] via SUBCUTANEOUS

## 2011-09-28 MED ORDER — ONDANSETRON HCL 4 MG/2ML IJ SOLN: 4.0000 mg | Freq: Four times a day (QID) | INTRAMUSCULAR | Status: DC | PRN

## 2011-09-28 MED ORDER — AMLODIPINE BESYLATE 10 MG PO TABS
10.0000 mg | ORAL_TABLET | Freq: Every day | ORAL | Status: DC
  Administered 2011-09-28 – 2011-10-01 (×4): 10 mg via ORAL
  Filled 2011-09-28 (×4): qty 1

## 2011-09-28 MED ORDER — SODIUM CHLORIDE 0.9 % IV SOLN: Freq: Once | INTRAVENOUS | Status: DC

## 2011-09-28 MED ORDER — INSULIN ASPART 100 UNIT/ML ~~LOC~~ SOLN: 0.0000 [IU] | Freq: Three times a day (TID) | SUBCUTANEOUS | Status: DC

## 2011-09-28 MED ORDER — HYDRALAZINE HCL 20 MG/ML IJ SOLN
10.0000 mg | Freq: Four times a day (QID) | INTRAMUSCULAR | Status: DC | PRN
  Administered 2011-09-28 – 2011-09-30 (×2): 10 mg via INTRAVENOUS
  Filled 2011-09-28 (×2): qty 1

## 2011-09-28 MED ORDER — ONDANSETRON HCL 4 MG/2ML IJ SOLN
4.0000 mg | Freq: Once | INTRAMUSCULAR | Status: AC
  Administered 2011-09-28: 4 mg via INTRAVENOUS
  Filled 2011-09-28: qty 2

## 2011-09-28 MED ORDER — SODIUM CHLORIDE 0.9 % IV BOLUS (SEPSIS)
1000.0000 mL | Freq: Once | INTRAVENOUS | Status: AC
  Administered 2011-09-28: 1000 mL via INTRAVENOUS

## 2011-09-28 MED ORDER — ALUM & MAG HYDROXIDE-SIMETH 200-200-20 MG/5ML PO SUSP: 30.0000 mL | Freq: Four times a day (QID) | ORAL | Status: DC | PRN

## 2011-09-28 MED ORDER — ONDANSETRON HCL 4 MG/2ML IJ SOLN
INTRAMUSCULAR | Status: AC
  Administered 2011-09-28: 4 mg
  Filled 2011-09-28: qty 2

## 2011-09-28 MED ORDER — LABETALOL HCL 200 MG PO TABS
200.0000 mg | ORAL_TABLET | Freq: Two times a day (BID) | ORAL | Status: DC
  Administered 2011-09-28 – 2011-10-01 (×6): 200 mg via ORAL
  Filled 2011-09-28 (×7): qty 1

## 2011-09-28 MED ORDER — HYDROCODONE-ACETAMINOPHEN 5-325 MG PO TABS: 1.0000 | ORAL_TABLET | Freq: Four times a day (QID) | ORAL | Status: DC | PRN

## 2011-09-28 MED ORDER — CLOPIDOGREL BISULFATE 75 MG PO TABS
75.0000 mg | ORAL_TABLET | Freq: Every day | ORAL | Status: DC
  Administered 2011-09-29 – 2011-10-01 (×3): 75 mg via ORAL
  Filled 2011-09-28 (×3): qty 1

## 2011-09-28 MED ORDER — ONDANSETRON HCL 4 MG PO TABS: 4.0000 mg | ORAL_TABLET | Freq: Four times a day (QID) | ORAL | Status: DC | PRN

## 2011-09-28 MED ORDER — INSULIN ASPART 100 UNIT/ML ~~LOC~~ SOLN: 0.0000 [IU] | Freq: Every day | SUBCUTANEOUS | Status: DC

## 2011-09-28 MED ORDER — ACETAMINOPHEN 325 MG PO TABS: 650.0000 mg | ORAL_TABLET | Freq: Four times a day (QID) | ORAL | Status: DC | PRN

## 2011-09-28 MED ORDER — ACETAMINOPHEN 650 MG RE SUPP: 650.0000 mg | Freq: Four times a day (QID) | RECTAL | Status: DC | PRN

## 2011-09-28 MED ORDER — SODIUM CHLORIDE 0.9 % IV SOLN: INTRAVENOUS | Status: AC

## 2011-09-28 MED ORDER — HYDROMORPHONE HCL PF 1 MG/ML IJ SOLN: 0.5000 mg | INTRAMUSCULAR | Status: DC | PRN

## 2011-09-28 MED ORDER — PANTOPRAZOLE SODIUM 40 MG PO TBEC
40.0000 mg | DELAYED_RELEASE_TABLET | Freq: Every day | ORAL | Status: DC
  Administered 2011-09-29 – 2011-10-01 (×3): 40 mg via ORAL
  Filled 2011-09-28 (×4): qty 1

## 2011-09-28 MED ORDER — ATORVASTATIN CALCIUM 80 MG PO TABS
80.0000 mg | ORAL_TABLET | Freq: Every day | ORAL | Status: DC
  Administered 2011-09-28 – 2011-10-01 (×4): 80 mg via ORAL
  Filled 2011-09-28 (×4): qty 1

## 2011-09-28 MED ORDER — ASPIRIN EC 81 MG PO TBEC
81.0000 mg | DELAYED_RELEASE_TABLET | Freq: Every day | ORAL | Status: DC
  Administered 2011-09-29 – 2011-10-01 (×3): 81 mg via ORAL
  Filled 2011-09-28 (×3): qty 1

## 2011-09-28 NOTE — ED Provider Notes (Signed)
History     CSN: [id number]  Arrival date & time 09/28/11  1114   First MD Initiated Contact with Patient 09/28/11 1228      Chief Complaint  Patient presents with  . syncopal episode     (Consider location/radiation/quality/duration/timing/severity/associated sxs/prior treatment) Patient is a 66 y.o. female presenting with syncope. The history is provided by the patient and a relative.  Loss of Consciousness This is a new problem. The current episode started today. Episode frequency: once. The problem has been rapidly improving. Associated symptoms include nausea and vomiting. Pertinent negatives include no abdominal pain, chest pain, chills, congestion, coughing, diaphoresis, fatigue, fever, headaches, neck pain, numbness, rash, urinary symptoms or weakness. Nothing aggravates the symptoms. She has tried nothing for the symptoms. The treatment provided no relief.  Loss of Consciousness This is a new problem. The current episode started today. Episode frequency: once. The problem has been rapidly improving. Nothing aggravates the symptoms. Associated symptoms include nausea and vomiting. Pertinent negatives include no abdominal pain, back pain, chest pain, confusion, diaphoresis, fever, headaches, light-headedness, palpitations or weakness. She has tried nothing for the symptoms. The treatment provided no relief.    Past Medical History  Diagnosis Date  . Diabetes mellitus   . Hypertension   . Stroke 2010  . Retinal disease     Past Surgical History  Procedure Date  . Abdominal hysterectomy     partial    No family history on file.  History  Substance Use Topics  . Smoking status: Former Smoker    Types: Cigarettes  . Smokeless tobacco: Not on file   Comment: quit about 40 years ago   . Alcohol Use: No    OB History    Grav Para Term Preterm Abortions TAB SAB Ect Mult Living                  Review of Systems  Constitutional: Negative for fever, chills,  diaphoresis and fatigue.  HENT: Negative for congestion, rhinorrhea and neck pain.   Respiratory: Negative for cough and shortness of breath.   Cardiovascular: Positive for syncope. Negative for chest pain and palpitations.  Gastrointestinal: Positive for nausea and vomiting. Negative for abdominal pain and diarrhea.  Genitourinary: Negative for dysuria and urgency.  Musculoskeletal: Negative for back pain.  Skin: Negative for color change and rash.  Neurological: Negative for weakness, light-headedness, numbness and headaches.  Psychiatric/Behavioral: Negative for confusion.  All other systems reviewed and are negative.    Allergies  Review of patient's allergies indicates not on file.  Home Medications  No current outpatient prescriptions on file.  BP 140/91  Pulse 70  Temp 97.7 F (36.5 C) (Oral)  Resp 16  SpO2 98%  Physical Exam  Nursing note and vitals reviewed. Constitutional: She is oriented to person, place, and time. She appears well-developed and well-nourished. No distress.  HENT:  Head: Normocephalic and atraumatic.  Eyes: EOM are normal.       Dilated ~6 mm, sluggishly and minimally reactive. Just received dilating drops at ophthalmologist's office.  Neck: Full passive range of motion without pain. No spinous process tenderness and no muscular tenderness present.  Cardiovascular: Normal rate, regular rhythm, normal heart sounds and intact distal pulses.   Pulmonary/Chest: Effort normal and breath sounds normal. No respiratory distress. She exhibits no tenderness.  Abdominal: Soft. Bowel sounds are normal. She exhibits no distension. There is no tenderness.  Musculoskeletal: She exhibits no tenderness.  Neurological: She is alert and oriented to person,  place, and time. She has normal strength. GCS eye subscore is 4. GCS verbal subscore is 5. GCS motor subscore is 6.  Skin: Skin is warm and dry.  Psychiatric: She has a normal mood and affect.    ED Course    Procedures (including critical care time)  Labs Reviewed  GLUCOSE, CAPILLARY - Abnormal; Notable for the following:    Glucose-Capillary 292 (*)     All other components within normal limits  BASIC METABOLIC PANEL - Abnormal; Notable for the following:    Potassium 3.3 (*)     Glucose, Bld 266 (*)     Creatinine, Ser 1.34 (*)     GFR calc non Af Amer 40 (*)     GFR calc Af Amer 47 (*)     All other components within normal limits  CBC  TROPONIN I  URINALYSIS, ROUTINE W REFLEX MICROSCOPIC   No results found.   Date: 09/28/2011  Rate: 74  Rhythm: normal sinus rhythm  QRS Axis: normal  Intervals: normal  ST/T Wave abnormalities: nonspecific T wave changes, ST elevations anteriorly and ST depressions inferiorly TWI V4-V6, I. Mild STE in V1, V2 consistent with early repol. ST depressions with TWI in II, III, aVF  Conduction Disutrbances:none  Narrative Interpretation:   Old EKG Reviewed: changes noted TWI in anterior leads, STE in V1, V2 in previous EKG. TWI in II, III, aVF, but T wave depressions are new. EKG 06/22/11    1. Syncope   2. Nonspecific ST-T wave electrocardiographic changes       MDM  67 yo F presents today with syncopal episode. She states she was in ophthalmologist's office, was given dilating eyedrops by the nurse (scheduled for laser treatment for diabetic retinopathy), and was feeling nauseated, so went to the bathroom thinking she had to throw up. The next thing she remembers is waking up on the ground with EMS around her. Per EMS, another patient went to go get the nurse in the office, because she found a patient lying unconscious on the ground. The event was not witnessed. The patient notes that 2 days ago she had moderate nausea, with multiple episodes of vomiting, but does not have any fevers, abdominal pain, diarrhea. She has had mild nausea since then, accompanied by a decreased appetite so has been eating a bland diet for the past couple of days. She did  have some mild nausea this morning prior to going to the office. She has not otherwise been sick, does not have any chest pain, shortness of breath, palpitations, any other complaints. She does not remember passing out previously, does not have any known history of dysrhythmias or heart disease. Other than mild residual nausea and photophobia do to her dilated eyes, feels back to her baseline currently. The patient was in a c-collar placed by EMS, but is not having neck pain and her C-spine was cleared clinically by NEXUS criteria. We'll check basic labs, EKG, orthostatics to evaluate for possible etiology of the patient's syncope.  Urine still pending. Mild increase in creatinine above baseline of 1. The patient does have some chronic nonspecific EKG changes, however her ST depressions in the inferior leads are new from her previous EKG. Due to these EKG changes in her event of syncope, will admit her for further evaluation the     Marcelino Scot, MD 09/28/11 1531

## 2011-09-28 NOTE — ED Notes (Addendum)
Pt did report dizziness with change of position from lying to sitting and sitting to standing. Felt like the room was spinning and she was drunk. Pt sts it is worse when she opens her eyes.

## 2011-09-28 NOTE — ED Notes (Signed)
Per EMS, pt brought in from opthalmologist office for syncopal episode in the bathroom. Pt was unconscious for 4-5 minutes. Denies any injury or pain. Pt sts she has been a nauseated for past couple days. EMS gave Zofran 4 mg IV. 22g to [facility]

## 2011-09-28 NOTE — ED Notes (Signed)
Given diet gingerale and crackers. Pt sts her nausea is better than it was but is still unable to give a urine specimen.

## 2011-09-28 NOTE — H&P (Signed)
History and Physical       Hospital Admission Note Date: 09/28/2011  Patient name: Yvonne Patel Medical record number: [id number] Date of birth: August 31, 1945 Age: 66 y.o. Gender: female PCP: Jilda Panda, MD   Chief Complaint:  Recurrent syncopal episodes  HPI: Patient is a 66 year old female with history of diabetes, hypertension, peripheral vascular disease, diabetic neuropathy, diabetic retinopathy presented to [facility] after having a syncopal episode at her ophthalmologist office. History was obtained from the patient and her family in the room. Patient reported that she had 3 episodes in the last 3 weeks of syncope. The first episode was at her ophthalmologist office after her eyes were dilated for examination, she went to use the restroom, felt extremely nauseated, dizzy and lightheaded but did not completely pass out. She however did have bowel incontinence with the whole episode. Patient subsequently had second episode at her friend's house 2 days ago when she felt very nauseated and had vomiting, dizzy and lightheaded. Patient reported that she had multiple episodes of vomiting but no fevers, abdominal pain or diarrhea and she had been eating bland diet for the last 2 days Today, patient was at her ophthalmologist office and after her eyes were dilated for examination, she again went to use the restroom and felt diaphoretic, dizzy, lightheaded and this time passed out. The next she remembered was waking up on the floor with EMS around her. Patient denies any urinary or bowel incontinence. . Patient denies any focal weakness.  Review of Systems:  Constitutional: Denies fever, chills, + diaphoretic, appetite change and fatigue.  HEENT: Denies photophobia, eye pain, redness, hearing loss, ear pain, congestion, sore throat, rhinorrhea, sneezing, mouth sores, trouble swallowing, neck pain, neck stiffness and tinnitus.   Respiratory: Denies SOB,  DOE, cough, chest tightness,  and wheezing.   Cardiovascular: Please see history of present illness, patient denied any chest pain shortness of breath or any leg swelling. Gastrointestinal: Denies abdominal pain, diarrhea, constipation, blood in stool and abdominal distention.  please see history of present illness. Genitourinary: Denies dysuria, urgency, frequency, hematuria, flank pain and difficulty urinating.  Musculoskeletal: Denies myalgias, back pain, joint swelling, arthralgias and gait problem.  Skin: Denies pallor, rash and wound.  Neurological: Please see history of present illness Hematological: Denies adenopathy. Easy bruising, personal or family bleeding history  Psychiatric/Behavioral: Denies suicidal ideation, mood changes, confusion, nervousness, sleep disturbance and agitation  Past Medical History: Past Medical History  Diagnosis Date  . Diabetes mellitus   . Hypertension   . Stroke 2010  . Retinal disease    Past Surgical History  Procedure Date  . Abdominal hysterectomy     partial    Medications: Prior to Admission medications   Medication Sig Start Date End Date Taking? Authorizing Provider  amLODipine (NORVASC) 10 MG tablet Take 10 mg by mouth daily.   Yes Historical Provider, MD  aspirin EC 81 MG tablet Take 81 mg by mouth daily.   Yes Historical Provider, MD  clopidogrel (PLAVIX) 75 MG tablet Take 75 mg by mouth daily.   Yes Historical Provider, MD  labetalol (NORMODYNE) 200 MG tablet Take 200 mg by mouth 2 (two) times daily.   Yes Historical Provider, MD  Liraglutide (VICTOZA) 18 MG/3ML SOLN Inject 1.2 mg into the skin daily.   Yes Historical Provider, MD  lisinopril (PRINIVIL,ZESTRIL) 20 MG tablet Take 20 mg by mouth daily.   Yes Historical Provider, MD  lisinopril-hydrochlorothiazide (PRINZIDE,ZESTORETIC) 20-25 MG per tablet Take 1 tablet by mouth daily.   Yes  Historical Provider, MD  metFORMIN (GLUCOPHAGE) 1000 MG tablet Take 1,000 mg by mouth 2 (two)  times daily with a meal.   Yes Historical Provider, MD  rosuvastatin (CRESTOR) 40 MG tablet Take 40 mg by mouth daily.   Yes Historical Provider, MD    Allergies:  No Known Allergies  Social History:  reports that she has quit smoking. Her smoking use included Cigarettes. She does not have any smokeless tobacco history on file. She reports that she does not drink alcohol or use illicit drugs.  Family History: Patient reports no family history of premature coronary artery disease or cancers in the family.  Physical Exam: Blood pressure 128/47, pulse 82, temperature 98.2 F (36.8 C), temperature source Oral, resp. rate 23, SpO2 100.00%. General: Alert, awake, oriented x3, in no acute distress. HEENT: normocephalic, atraumatic, anicteric sclera, pink conjunctiva, pupils equal and reactive to light and accomodation, oropharynx clear Neck: supple, no masses or lymphadenopathy, no goiter, no bruits  Heart: Regular rate and rhythm, without murmurs, rubs or gallops. Lungs: Clear to auscultation bilaterally, no wheezing, rales or rhonchi. Abdomen: Soft, nontender, nondistended, positive bowel sounds, no masses. Extremities: No clubbing, cyanosis or edema with positive pedal pulses. Neuro: Grossly intact, no focal neurological deficits, strength 5/5 upper and lower extremities bilaterally Psych: alert and oriented x 3, normal mood and affect Skin: no rashes or lesions, warm and dry   LABS on Admission:  Basic Metabolic Panel:  Lab [id number] 1238  NA 137  K 3.3*  CL 97  CO2 27  GLUCOSE 266*  BUN 20  CREATININE 1.34*  CALCIUM 9.5  MG --  PHOS --   CBC:  Lab 09/28/11 1238  WBC 6.4  NEUTROABS --  HGB 13.3  HCT 39.0  MCV 91.1  PLT 153   Cardiac Enzymes:  Lab 09/28/11 1238  CKTOTAL --  CKMB --  CKMBINDEX --  TROPONINI <0.30   CBG:  Lab 09/28/11 1125  GLUCAP 292*     Radiological Exams on Admission: No results found.  Assessment/Plan Present on Admission:    .Syncope recurrent  - Admit for observation on telemetry, rule out ACS with serial cardiac enzymes. EKG showed T wave inversions from V3 to V6, STE in V1 and V2, TWI in II,III, avf, and compared with previous EKG from April 2013. I reviewed the EKG with Dr. Aundra Dubin from cardiology who felt that it is likely due to LVH and repolarization changes, recommended observation on telemetry and serial cardiac enzymes, 2D ECHO. - There is a possible component of vasovagal episode and dehydration as patient has not been eating well in the last 2-3 days. Given the episode of bowel incontinence and unwitnessed syncopal episodes, I will rule out seizures and obtain EEG, CT head. - Patient may need a Holter monitor or an event monitor at discharge.   Marland KitchenPERIPHERAL VASCULAR DISEASE: Continue aspirin and Plavix   .ESSENTIAL HYPERTENSION: - Placed on amlodipine, labetalol and PRN hydralazine   .ANEMIA, IRON DEFICIENCY: H&H stable   .HYPERLIPIDEMIA:  - Continue statins, obtain lipid panel   .DIABETES MELLITUS, TYPE II, UNCONTROLLED:  - Continue carb modified diet, sliding scale insulin and Victoza   .Acute renal failure: - Hold lisinopril, HCTZ, place on gentle hydration  Hypokalemia: Will replace  DVT prophylaxis: SCDs CODE STATUS: Full code  Further plan will depend as patient's clinical course evolves and further radiologic and laboratory data become available.   Time Spent on Admission: 1 hour  RAI,RIPUDEEP M.D. [facility] 09/28/2011, 4:17 PM  Pager: [phone]  If 7PM-7AM, please contact night-coverage [email or web address] Password [email or web address]

## 2011-09-28 NOTE — Progress Notes (Signed)
Yvonne Patel, is a 66 y.o. female,   MRN: [id number]  -  DOB - 1945/12/01  Outpatient Primary MD for the patient is MOREIRA,ROY, MD  in for    Chief Complaint  Patient presents with  . syncopal episode      Blood pressure 128/47, pulse 82, temperature 98.2 F (36.8 C), temperature source Oral, resp. rate 23, SpO2 100.00%.  Principal Problem:  *Syncope Active Problems:  DIABETES MELLITUS, TYPE II, UNCONTROLLED  HYPERLIPIDEMIA  ANEMIA, IRON DEFICIENCY  ESSENTIAL HYPERTENSION  PERIPHERAL VASCULAR DISEASE  Slurred speech  Acute renal failure  66 yo hx DM, HTN, stroke 2010 presents 2 ED cc syncope. Reports nausea vomiting episode 2 days ago and has not been eating much since. Family reports some slurred speech this am . No reports CP, numbness/tingling/weakness. Does report lightheaded ness. Has been taking diabetes medicine but does not check sugar regularly. Work up in ED yields stable VSS (not orthostatic) potassium 3.3, creatinine 1.34. No focal deficits. Will request RN swallow eval then carb modified diet, tele bed, IV fluids.

## 2011-09-28 NOTE — ED Provider Notes (Addendum)
I saw and evaluated the patient, reviewed the resident's note and I agree with the findings and plan.  Patient with no known primary cardiac pathology comes in with cc of syncope. Per SF syncope score patient would benefit from tele admission as there are some new EKG  Findings with ST depressions. Will admit for telemetry monitoring.  Varney Biles, MD 09/28/11 [place], MD 09/28/11 1559

## 2011-09-28 NOTE — ED Notes (Signed)
Dr. Chauncey Reading at bedside with patient and family

## 2011-09-28 NOTE — ED Notes (Signed)
Admitting doctor at the bedside 

## 2011-09-29 ENCOUNTER — Inpatient Hospital Stay (HOSPITAL_COMMUNITY): Payer: Medicare HMO

## 2011-09-29 DIAGNOSIS — H579 Unspecified disorder of eye and adnexa: Secondary | ICD-10-CM

## 2011-09-29 DIAGNOSIS — E11311 Type 2 diabetes mellitus with unspecified diabetic retinopathy with macular edema: Secondary | ICD-10-CM

## 2011-09-29 DIAGNOSIS — R569 Unspecified convulsions: Secondary | ICD-10-CM

## 2011-09-29 DIAGNOSIS — E1139 Type 2 diabetes mellitus with other diabetic ophthalmic complication: Secondary | ICD-10-CM

## 2011-09-29 DIAGNOSIS — I517 Cardiomegaly: Secondary | ICD-10-CM

## 2011-09-29 LAB — BASIC METABOLIC PANEL
BUN: 21 mg/dL (ref 6–23)
Chloride: 106 mEq/L (ref 96–112)
Creatinine, Ser: 1.17 mg/dL — ABNORMAL HIGH (ref 0.50–1.10)
GFR calc Af Amer: 55 mL/min — ABNORMAL LOW (ref >90)

## 2011-09-29 LAB — GLUCOSE, CAPILLARY
Glucose-Capillary: 166 mg/dL — ABNORMAL HIGH (ref 70–99)
Glucose-Capillary: 137 mg/dL — ABNORMAL HIGH (ref 70–99)
Glucose-Capillary: 155 mg/dL — ABNORMAL HIGH (ref 70–99)

## 2011-09-29 LAB — URINALYSIS, ROUTINE W REFLEX MICROSCOPIC
Glucose, UA: NEGATIVE mg/dL
Hgb urine dipstick: NEGATIVE
Protein, ur: 30 mg/dL — AB
Specific Gravity, Urine: 1.017 (ref 1.005–1.030)
Urobilinogen, UA: 1 mg/dL (ref 0.0–1.0)

## 2011-09-29 LAB — CBC
HCT: 32.1 % — ABNORMAL LOW (ref 36.0–46.0)
MCHC: 34 g/dL (ref 30.0–36.0)
MCV: 92 fL (ref 78.0–100.0)
RDW: 12.7 % (ref 11.5–15.5)
WBC: 7.1 10*3/uL (ref 4.0–10.5)

## 2011-09-29 LAB — URINE MICROSCOPIC-ADD ON

## 2011-09-29 LAB — HEMOGLOBIN A1C
Hgb A1c MFr Bld: 8.2 % — ABNORMAL HIGH (ref <5.7)
Mean Plasma Glucose: 189 mg/dL — ABNORMAL HIGH (ref <117)

## 2011-09-29 MED ORDER — POTASSIUM CHLORIDE CRYS ER 20 MEQ PO TBCR
40.0000 meq | EXTENDED_RELEASE_TABLET | Freq: Once | ORAL | Status: AC
  Administered 2011-09-29: 40 meq via ORAL

## 2011-09-29 MED ORDER — LIRAGLUTIDE 18 MG/3ML ~~LOC~~ SOLN
1.2000 mg | Freq: Every day | SUBCUTANEOUS | Status: DC
  Administered 2011-09-30 – 2011-10-01 (×2): 1.2 mg via SUBCUTANEOUS
  Filled 2011-09-29 (×3): qty 0.2

## 2011-09-29 NOTE — Progress Notes (Signed)
Routine EEG completed.  

## 2011-09-29 NOTE — Progress Notes (Signed)
  Echocardiogram 2D Echocardiogram has been performed.  Yvonne Patel 09/29/2011, 11:02 AM

## 2011-09-29 NOTE — Progress Notes (Signed)
Utilization review complete 

## 2011-09-29 NOTE — Progress Notes (Signed)
Subjective: Patient feeling better. Appetite better. She has had decrease oral intake over last 3 days.  She had near syncope 3 weeks ago at opthalmology office. She had syncope at opthalmology office prior to admission.  She received medication to dilate pupils, she has had that medication before without problems. She vomits 3 times Sunday, not eating well for last 3 days.  Objective: Filed Vitals:   09/29/11 0912 09/29/11 0913 09/29/11 0916 09/29/11 1200  BP: 141/69 153/74 150/74 150/70  Pulse: 81 82 85 85  Temp:    98 F (36.7 C)  TempSrc:    Oral  Resp:    18  Height:      Weight:      SpO2: 98% 100%  94%   Weight change:    General: Alert, awake, oriented x3, in no acute distress.  HEENT: No bruits, no goiter.  Heart: Regular rate and rhythm, without murmurs, rubs, gallops.  Lungs: CTA, bilateral air movement.  Abdomen: Soft, nontender, nondistended, positive bowel sounds.  Neuro: Grossly intact, nonfocal. Extremities; no edema.   Lab Results:  [facility] 09/29/11 0427 09/28/11 1238  NA 142 137  K 3.4* 3.3*  CL 106 97  CO2 24 27  GLUCOSE 141* 266*  BUN 21 20  CREATININE 1.17* 1.34*  CALCIUM 8.8 9.5  MG -- --  PHOS -- --    Basename 09/29/11 0427 09/28/11 1238  WBC 7.1 6.4  NEUTROABS -- --  HGB 10.9* 13.3  HCT 32.1* 39.0  MCV 92.0 91.1  PLT 140* 153    Basename 09/29/11 0500 09/28/11 2256 09/28/11 1742  CKTOTAL 91 92 95  CKMB 2.6 3.1 3.2  CKMBINDEX -- -- --  TROPONINI <0.30 <0.30 <0.30   Basename 09/28/11 1742  HGBA1C 8.2*    Studies/Results: Ct Head Wo Contrast  09/28/2011  *RADIOLOGY REPORT*  Clinical Data: Dizziness, syncopal episode, nausea  CT HEAD WITHOUT CONTRAST  Technique:  Contiguous axial images were obtained from the base of the skull through the vertex without contrast.  Comparison: 01/03/2010  Findings: Chronic deep white matter microvascular ischemic changes along the basal ganglia and left internal capsule anteriorly.  No acute  intracranial hemorrhage, definite acute infarction, focal edema, mass lesion, midline shift, herniation, hydrocephalus, or extra-axial fluid collection.  Gray-white matter differentiation maintained.  Cisterns patent.  No cerebellar abnormality. Symmetric orbits.  Mastoids and sinuses clear.  Stable diffuse calvarial thickening.  IMPRESSION: Stable deep white matter chronic microvascular changes.  Otherwise no acute intracranial process by noncontrast CT.  Original Report Authenticated By: Jerilynn Mages. Daryll Brod, M.D.    Medications: I have reviewed the patient's current medications.  1-Syncope: Patient was orthostatic. Probably setting dehydration. IV fluids. EEG pending, ECHO pending. Will arrange follow up with cardio due to second episode and EKG changes. Cardiac enzymes negative, CT head negative. Hold HCTZ.   2-PERIPHERAL VASCULAR DISEASE: Continue aspirin and Plavix   3-ESSENTIAL HYPERTENSION:  - Placed on amlodipine, labetalol and PRN hydralazine   4-ANEMIA, IRON DEFICIENCY: H&H stable   5-HYPERLIPIDEMIA:  - Continue statins, obtain lipid panel   6-DIABETES MELLITUS, TYPE II, UNCONTROLLED:  - Continue carb modified diet, sliding scale insulin and Victoza   7-Acute renal failure:  - Hold lisinopril, HCTZ, place on gentle hydration  8-Hypokalemia: Will replace   LOS: 1 day   Sevin Langenbach M.D.  [facility] 09/29/2011, 4:03 PM

## 2011-09-29 NOTE — ED Provider Notes (Signed)
I saw and evaluated the patient, reviewed the resident's note and I agree with the findings and plan.  Varney Biles, MD 09/29/11 1846

## 2011-09-30 ENCOUNTER — Inpatient Hospital Stay (HOSPITAL_COMMUNITY): Payer: Medicare HMO

## 2011-09-30 DIAGNOSIS — Z8673 Personal history of transient ischemic attack (TIA), and cerebral infarction without residual deficits: Secondary | ICD-10-CM | POA: Diagnosis present

## 2011-09-30 DIAGNOSIS — R9431 Abnormal electrocardiogram [ECG] [EKG]: Secondary | ICD-10-CM

## 2011-09-30 LAB — URINE CULTURE

## 2011-09-30 LAB — GLUCOSE, CAPILLARY
Glucose-Capillary: 156 mg/dL — ABNORMAL HIGH (ref 70–99)
Glucose-Capillary: 159 mg/dL — ABNORMAL HIGH (ref 70–99)
Glucose-Capillary: 120 mg/dL — ABNORMAL HIGH (ref 70–99)
Glucose-Capillary: 136 mg/dL — ABNORMAL HIGH (ref 70–99)

## 2011-09-30 MED ORDER — LORAZEPAM 2 MG/ML IJ SOLN
INTRAMUSCULAR | Status: AC
  Administered 2011-09-30: 1 mg via INTRAVENOUS
  Filled 2011-09-30: qty 1

## 2011-09-30 MED ORDER — LORAZEPAM 2 MG/ML IJ SOLN: 1.0000 mg | Freq: Once | INTRAMUSCULAR | Status: DC

## 2011-09-30 MED ORDER — LISINOPRIL 20 MG PO TABS
20.0000 mg | ORAL_TABLET | Freq: Every day | ORAL | Status: DC
  Administered 2011-09-30 – 2011-10-01 (×2): 20 mg via ORAL
  Filled 2011-09-30 (×2): qty 1

## 2011-09-30 NOTE — Progress Notes (Signed)
Reviewed and agree with Langley Gauss Sturdivent's assessments.

## 2011-09-30 NOTE — Progress Notes (Signed)
PT Cancellation and Discharge Note  Treatment cancelled today due to pt up independent without any need for PT.  Spoke with pt who notes she feels back to baseline.  Will sign off.    Catarina Hartshorn, [place] 09/30/2011, 9:14 AM

## 2011-09-30 NOTE — Progress Notes (Signed)
Patient had abnormal EEG, MRI was recommended. I received call from MRI patient has acute stroke. I will consult neuro. Will follow their recommendation.  Niel Hummer, MD.

## 2011-09-30 NOTE — Discharge Summary (Addendum)
Physician Discharge Summary  Yvonne Patel [id number] DOB: Aug 28, 1945 DOA: 09/28/2011  PCP: Jilda Panda, MD  Admit date: 09/28/2011 Discharge date: 09/30/2011  Discharge Diagnoses:   Syncope, probably setting orthostatic hypotension, stroke.  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  DIABETES MELLITUS, TYPE II, UNCONTROLLED  HYPERLIPIDEMIA  ANEMIA, IRON DEFICIENCY  ESSENTIAL HYPERTENSION  PERIPHERAL VASCULAR DISEASE  Acute renal failure   Discharge Condition: Stable.   Disposition: Needs to follow up with cardiology for further work up syncope and EKG changes.   Diet: Diabetic Diet.   History of present illness:  Patient is a 66 year old female with history of diabetes, hypertension, peripheral vascular disease, diabetic neuropathy, diabetic retinopathy presented to [facility] after having a syncopal episode at her ophthalmologist office. History was obtained from the patient and her family in the room. Patient reported that she had 3 episodes in the last 3 weeks of syncope. The first episode was at her ophthalmologist office after her eyes were dilated for examination, she went to use the restroom, felt extremely nauseated, dizzy and lightheaded but did not completely pass out. She however did have bowel incontinence with the whole episode. Patient subsequently had second episode at her friend's house 2 days ago when she felt very nauseated and had vomiting, dizzy and lightheaded. Patient reported that she had multiple episodes of vomiting but no fevers, abdominal pain or diarrhea and she had been eating bland diet for the last 2 days  Today, patient was at her ophthalmologist office and after her eyes were dilated for examination, she again went to use the restroom and felt diaphoretic, dizzy, lightheaded and this time passed out.  The next she remembered was waking up on the floor with EMS around her. Patient denies any urinary or bowel incontinence. . Patient denies any  focal weakness.   Hospital Course:   1-Syncope: Probably secondary to orthostatic hypotension. Patient was orthostatic. Probably setting dehydration. Patient received IV fluids. EEG pending, ECHO normal EF, no wall motion abnormalities. Will arrange follow up with cardio due to second episode and EKG changes. Cardiac enzymes negative, CT head negative. Hold HCTZ at discharge. If EEG normal will discharge patient today. Information for follow up was given to the patient. She is feeling well. Denies dizziness, lightheaded.   2-PERIPHERAL VASCULAR DISEASE: Continue aspirin and Plavix   3-ESSENTIAL HYPERTENSION:  - Placed on amlodipine, labetalol. Resume lisinopril at discharge.   4-ANEMIA, IRON DEFICIENCY: H&H stable. Need work up outpatient.   5-HYPERLIPIDEMIA:  - Continue statins.  6-DIABETES MELLITUS, TYPE II, UNCONTROLLED:  - Continue carb modified diet. Resume home medications at discharge. HB-A1c at 8. Needs better blood sugar controlled.   7-Acute renal failure:  - Hold lisinopril, HCTZ, place on gentle hydration   8-Hypokalemia: replace      Discharge Exam: Filed Vitals:   09/30/11 0527  BP: 143/70  Pulse: 69  Temp:   Resp:    Filed Vitals:   09/29/11 2149 09/30/11 0000 09/30/11 0400 09/30/11 0527  BP: 163/76 142/71 176/82 143/70  Pulse: 78 81 75 69  Temp:  98.3 F (36.8 C) 98.3 F (36.8 C)   TempSrc:  Oral Oral   Resp:  18 18   Height:      Weight:      SpO2:  99% 99%    General: No distress Cardiovascular: S1, S2 RRR. Respiratory: CTA. Neuro exam: Non focal.   Discharge Instructions  Discharge Orders    Future Orders Please Complete By Expires   Diet Carb Modified  Increase activity slowly        Medication List  As of 09/30/2011  8:32 AM   STOP taking these medications         lisinopril-hydrochlorothiazide 20-25 MG per tablet         TAKE these medications         amLODipine 10 MG tablet   Commonly known as: NORVASC   Take 10 mg by  mouth daily.      aspirin EC 81 MG tablet   Take 81 mg by mouth daily.      clopidogrel 75 MG tablet   Commonly known as: PLAVIX   Take 75 mg by mouth daily.      labetalol 200 MG tablet   Commonly known as: NORMODYNE   Take 200 mg by mouth 2 (two) times daily.      lisinopril 20 MG tablet   Commonly known as: PRINIVIL,ZESTRIL   Take 20 mg by mouth daily.      metFORMIN 1000 MG tablet   Commonly known as: GLUCOPHAGE   Take 1,000 mg by mouth 2 (two) times daily with a meal.      rosuvastatin 40 MG tablet   Commonly known as: CRESTOR   Take 40 mg by mouth daily.      VICTOZA 18 MG/3ML Soln   Generic drug: Liraglutide   Inject 1.2 mg into the skin daily.              The results of significant diagnostics from this hospitalization (including imaging, microbiology, ancillary and laboratory) are listed below for reference.    Significant Diagnostic Studies: Ct Head Wo Contrast  09/28/2011  *RADIOLOGY REPORT*  Clinical Data: Dizziness, syncopal episode, nausea  CT HEAD WITHOUT CONTRAST  Technique:  Contiguous axial images were obtained from the base of the skull through the vertex without contrast.  Comparison: 01/03/2010  Findings: Chronic deep white matter microvascular ischemic changes along the basal ganglia and left internal capsule anteriorly.  No acute intracranial hemorrhage, definite acute infarction, focal edema, mass lesion, midline shift, herniation, hydrocephalus, or extra-axial fluid collection.  Gray-white matter differentiation maintained.  Cisterns patent.  No cerebellar abnormality. Symmetric orbits.  Mastoids and sinuses clear.  Stable diffuse calvarial thickening.  IMPRESSION: Stable deep white matter chronic microvascular changes.  Otherwise no acute intracranial process by noncontrast CT.  Original Report Authenticated By: Jerilynn Mages. Daryll Brod, M.D.    Microbiology: Recent Results (from the past 240 hour(s))  URINE CULTURE     Status: Normal   Collection Time     09/29/11  5:00 AM      Component Value Range Status Comment   Specimen Description URINE, RANDOM   Final    Special Requests NONE   Final    Culture  Setup Time 09/29/2011 05:32   Final    Colony Count NO GROWTH   Final    Culture NO GROWTH   Final    Report Status 09/30/2011 FINAL   Final      Labs: Basic Metabolic Panel:  Lab [id number] 0427 09/28/11 1238  NA 142 137  K 3.4* 3.3*  CL 106 97  CO2 24 27  GLUCOSE 141* 266*  BUN 21 20  CREATININE 1.17* 1.34*  CALCIUM 8.8 9.5  MG -- --  PHOS -- --   CBC:  Lab 09/29/11 0427 09/28/11 1238  WBC 7.1 6.4  NEUTROABS -- --  HGB 10.9* 13.3  HCT 32.1* 39.0  MCV 92.0 91.1  PLT 140* 153  Cardiac Enzymes:  Lab 09/29/11 0500 09/28/11 2256 09/28/11 1742 09/28/11 1238  CKTOTAL 91 92 95 --  CKMB 2.6 3.1 3.2 --  CKMBINDEX -- -- -- --  TROPONINI <0.30 <0.30 <0.30 <0.30   BNP: No components found with this basename: POCBNP:5 CBG:  Lab 09/30/11 0730 09/29/11 2011 09/29/11 1613 09/29/11 1132 09/29/11 0732  GLUCAP 156* 155* 137* 166* 124*    Time coordinating discharge: 30 minutes  Signed:  [facility] 09/30/2011, 8:32 AM   Addendum:  1-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 EEG was abnormal, show very mild asymmetry of activities during sleep suggesting either a diffuse cortical abnormality of the left hemisphere or fluid collection over the same area, or less likely a skull defect in the right hemispheric region. 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 Neurology was consulted. Dr Leonie Man think stroke was probably related to decrease perfusion from dehydration. Syncope might be explained by stroke. Continue with plavix and aspirin , patient was already on this medications.  Patient will need to follow up with Dr Leonie Man was repeat EEG.   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 He  recommend follow up outpatient.

## 2011-09-30 NOTE — Consult Note (Signed)
Referring Physician: Dr. Frederic Jericho    Chief Complaint: Dizziness nausea and syncope.  HPI: Yvonne Patel is an 66 y.o. female Mr. diabetes mellitus hypertension and stroke, presenting with recurrent dizziness as well as syncope. No seizure activity was described. No postictal state was described. CT scan of her head was unremarkable. MRI study today showed acute left paracentral pontine ischemic stroke. 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 Patient is on aspirin and Plavix daily. She had an echocardiogram on 09/29/2011 which was unremarkable. NIH stroke score at this point is 0.  LSN: 12:30 PM on 09/28/2011 tPA Given: No: Beyond time window for treatment consideration MRankin: 0  Past Medical History  Diagnosis Date  . Hypertension   . Stroke 2010  . Retinal disease   . Refusal of blood transfusions as patient is Jehovah's Witness   . Peripheral vascular disease   . Syncope   . Diabetes mellitus   . Neuromuscular disorder     neuropathy    History reviewed. No pertinent family history.   Medications:  Scheduled:   . sodium chloride   Intravenous Once  . sodium chloride   Intravenous STAT  . amLODipine  10 mg Oral Daily  . aspirin EC  81 mg Oral Daily  . atorvastatin  80 mg Oral q1800  . clopidogrel  75 mg Oral Daily  . insulin aspart  0-15 Units Subcutaneous TID WC  . insulin aspart  0-5 Units Subcutaneous QHS  . labetalol  200 mg Oral BID  . Liraglutide  1.2 mg Subcutaneous Daily  . LORazepam      . LORazepam  1 mg Intravenous Once  . pantoprazole  40 mg Oral Q0600  . DISCONTD: Liraglutide  1.2 mg Subcutaneous Daily   KG:[id number], acetaminophen, alum & mag hydroxide-simeth, hydrALAZINE, HYDROcodone-acetaminophen, HYDROmorphone (DILAUDID) injection, ondansetron (ZOFRAN)  IV, ondansetron   Physical Examination: Blood pressure 149/83, pulse 72, temperature 98.4 F (36.9 C), temperature source Oral, resp. rate 20, height 5\' 5"  (1.651 m), weight 80.287 kg (177 lb), SpO2 98.00%.  Neurologic Examination: Mental Status: Alert, oriented, thought content appropriate.  Speech fluent without evidence of aphasia. Able to follow commands without difficulty. Cranial Nerves: II-Visual fields were normal. III/IV/VI-Pupils were equal and reacted. Extraocular movements were full and conjugate.    V/VII-no facial numbness and no facial weakness. VIII-normal. X-normal speech and symmetrical palatal movement. Motor: 5/5 bilaterally with normal tone and bulk Sensory: Normal throughout. Deep Tendon Reflexes: 1+ and symmetric. Plantars: Flexor bilaterally Cerebellar: Normal finger-to-nose testing. Carotid auscultation: Transmitted systolic murmur to carotids, right greater than left.  Ct Head Wo Contrast  09/28/2011  *RADIOLOGY REPORT*  Clinical Data: Dizziness, syncopal episode, nausea  CT HEAD WITHOUT CONTRAST  Technique:  Contiguous axial images were obtained from the base of the skull through the vertex without contrast.  Comparison: 01/03/2010  Findings: Chronic deep white matter microvascular ischemic changes along the basal ganglia and left internal capsule anteriorly.  No acute intracranial hemorrhage, definite acute infarction, focal edema, mass lesion, midline shift, herniation, hydrocephalus, or extra-axial fluid collection.  Gray-white matter differentiation maintained.  Cisterns patent.  No cerebellar abnormality. Symmetric orbits.  Mastoids and sinuses clear.  Stable diffuse calvarial thickening.  IMPRESSION: Stable deep white matter chronic microvascular changes.  Otherwise no acute intracranial process by noncontrast CT.  Original Report Authenticated  By: M. Daryll Brod, M.D.   Mr Jodene Nam Head Wo Contrast  09/30/2011  *RADIOLOGY REPORT*  Clinical Data:  Syncope.   Dizziness.  Nausea.  Hypertensive diabetic.  MRI BRAIN WITHOUT CONTRAST MRA HEAD WITHOUT CONTRAST  Technique: Multiplanar, multiecho pulse sequences of the brain and surrounding structures were obtained according to standard protocol without intravenous contrast.  Angiographic images of the head were obtained using MRA technique without contrast.  Comparison: 09/28/2011 CT.  01/03/2010 MR.  MRI HEAD  Findings:  Small acute non hemorrhagic left paracentral pontine infarct.  Remote basal ganglia infarcts bilaterally.  These appear to have been partially hemorrhagic otherwise no evidence of intracranial hemorrhage.  Mild to moderate small vessel disease type changes.  Anterior left parafalcine 6 mm calcification may represent calcification of the falx or tiny meningioma without mass effect, otherwise no intracranial mass lesion detected on this unenhanced exam.  No hydrocephalus.  Exophthalmos. The  Partially empty expanded sella.  This may be an incidental finding although also described in patients with pseudotumor cerebri.  IMPRESSION: Small acute non hemorrhagic left paracentral pontine infarct.  Please see above.  This has been made a PRA call report utilizing dashboard call feature.  MRA HEAD  Findings: 5 mm right periophthalmic aneurysm.  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  Moderate to marked tandem stenosis of the cavernous segment of the internal carotid artery greater on the left.  Mild irregularity and narrowing supraclinoid segment of the internal carotid artery bilaterally.  Mild narrowing proximal M1 segment left middle cerebral artery.  Middle cerebral artery branch vessel irregularity bilaterally.  Mild narrowing distal A1 segment right anterior cerebral artery.  Small caliber vertebral arteries bilaterally with moderate to marked narrowing bilaterally. Narrowed irregular PICA bilaterally.  High-grade stenosis/occlusion mid aspect of the basilar artery.   Nonvisualization AICAs.  Fetal type origin of the posterior cerebral arteries.  Mild to moderate narrowing involving portions of the superior cerebellar arteries and posterior cerebral arteries.  IMPRESSION: 5 mm right periophthalmic aneurysm.  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  Moderate to marked tandem stenosis of the cavernous segment of the internal carotid artery greater on the left.  Mild irregularity and narrowing supraclinoid segment of the internal carotid artery bilaterally.  Mild narrowing proximal M1 segment left middle cerebral artery.  Middle cerebral artery branch vessel irregularity bilaterally.  Mild narrowing distal A1 segment right anterior cerebral artery.  Small caliber vertebral arteries bilaterally with moderate to marked narrowing bilaterally. Narrowed irregular PICA bilaterally.  High-grade stenosis/occlusion mid aspect of the basilar artery.  Nonvisualization AICAs.  Fetal type origin of the posterior cerebral arteries.  Mild to moderate narrowing involving portions of the superior cerebellar arteries and posterior cerebral arteries.  Original Report Authenticated By: Doug Sou, M.D.   Mr Brain Wo Contrast  09/30/2011  *RADIOLOGY REPORT*  Clinical Data:  Syncope.  Dizziness.  Nausea.  Hypertensive diabetic.  MRI BRAIN WITHOUT CONTRAST MRA HEAD WITHOUT CONTRAST  Technique: Multiplanar, multiecho pulse sequences of the brain and surrounding structures were obtained according to standard protocol without intravenous contrast.  Angiographic images of the head were obtained using MRA technique without contrast.  Comparison: 09/28/2011 CT.  01/03/2010 MR.  MRI HEAD  Findings:  Small acute non hemorrhagic left paracentral pontine infarct.  Remote basal ganglia infarcts bilaterally.  These appear to have been partially hemorrhagic otherwise no evidence of intracranial hemorrhage.  Mild to moderate small vessel disease type changes.  Anterior  left parafalcine 6 mm calcification may represent calcification of the falx or tiny meningioma without mass effect, otherwise no intracranial mass lesion detected on this unenhanced exam.  No hydrocephalus.  Exophthalmos. The  Partially empty expanded sella.  This may be an incidental finding although also described in patients with pseudotumor cerebri.  IMPRESSION: Small acute non hemorrhagic left paracentral pontine infarct.  Please see above.  This has been made a PRA call report utilizing dashboard call feature.  MRA HEAD  Findings: 5 mm right periophthalmic aneurysm.  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  Moderate to marked tandem stenosis of the cavernous segment of the internal carotid artery greater on the left.  Mild irregularity and narrowing supraclinoid segment of the internal carotid artery bilaterally.  Mild narrowing proximal M1 segment left middle cerebral artery.  Middle cerebral artery branch vessel irregularity bilaterally.  Mild narrowing distal A1 segment right anterior cerebral artery.  Small caliber vertebral arteries bilaterally with moderate to marked narrowing bilaterally. Narrowed irregular PICA bilaterally.  High-grade stenosis/occlusion mid aspect of the basilar artery.  Nonvisualization AICAs.  Fetal type origin of the posterior cerebral arteries.  Mild to moderate narrowing involving portions of the superior cerebellar arteries and posterior cerebral arteries.  IMPRESSION: 5 mm right periophthalmic aneurysm.  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  Moderate to marked tandem stenosis of the cavernous segment of the internal carotid artery greater on the left.  Mild irregularity and narrowing supraclinoid segment of the internal carotid artery bilaterally.  Mild narrowing proximal M1 segment left middle cerebral artery.  Middle cerebral artery branch vessel irregularity bilaterally.  Mild narrowing  distal A1 segment right anterior cerebral artery.  Small caliber vertebral arteries bilaterally with moderate to marked narrowing bilaterally. Narrowed irregular PICA bilaterally.  High-grade stenosis/occlusion mid aspect of the basilar artery.  Nonvisualization AICAs.  Fetal type origin of the posterior cerebral arteries.  Mild to moderate narrowing involving portions of the superior cerebellar arteries and posterior cerebral arteries.  Original Report Authenticated By: Doug Sou, M.D.    Assessment: 66 y.o. female a history of previous stroke, diabetes mellitus, hypertension, presenting with acute left paracentral ischemic pontine infarction.  Stroke Risk Factors - diabetes mellitus, family history and hypertension  Plan: 1. HgbA1c, fasting lipid panel 2. PT consult, OT consult 3. Carotid dopplers 4. Prophylactic therapy-Antiplatelet meds: Aspirin 81 mg per day and  Plavix 75 mg per day 5. Risk factor modification 7 Telemetry monitoring  C.R. Nicole Kindred, MD [facility] [phone]  09/30/2011, 11:53 AM

## 2011-10-01 DIAGNOSIS — R4789 Other speech disturbances: Secondary | ICD-10-CM

## 2011-10-01 DIAGNOSIS — I6330 Cerebral infarction due to thrombosis of unspecified cerebral artery: Secondary | ICD-10-CM

## 2011-10-01 LAB — GLUCOSE, CAPILLARY: Glucose-Capillary: 113 mg/dL — ABNORMAL HIGH (ref 70–99)

## 2011-10-01 LAB — LIPID PANEL
LDL Cholesterol: 115 mg/dL — ABNORMAL HIGH (ref 0–99)
Triglycerides: 120 mg/dL (ref <150)
VLDL: 24 mg/dL (ref 0–40)

## 2011-10-01 NOTE — Progress Notes (Signed)
Review and agree with Langley Gauss Sturdivant's assessments.

## 2011-10-01 NOTE — Progress Notes (Signed)
PT Cancellation and Discharge Note  Treatment cancelled and pt discharged from PT services today due to pt still Independent with mobility.  No deficits noted during mobility yesterday and per RN and pt no changes today.  Will sign off.    Catarina Hartshorn, [place] 10/01/2011, 10:24 AM

## 2011-10-01 NOTE — Care Management Note (Unsigned)
    Page 1 of 2   10/01/2011     3:46:57 PM   CARE MANAGEMENT NOTE 10/01/2011  Patient:  Yvonne Patel, Yvonne Patel   Account Number:  [id number]  Date Initiated:  10/01/2011  Documentation initiated by:  GRAVES-BIGELOW,Suprina [place]  Subjective/Objective Assessment:   Pt admitted with syncope and collapse. Pt has family support. She will benefit from [facility] RN for disease management. Per PT notes pt is independent.     Action/Plan:   Order received for [facility] RN. CM did discuss with pt which agency she would like to use and she wanted to contact her daughter for assistance.   Anticipated DC Date:  10/01/2011   Anticipated DC Plan:  [place]  CM consult      [facility] Choice  HOME HEALTH   Choice offered to / List presented to:  C-1 Patient        [place] arranged  HH-1 RN  HH-10 DISEASE MANAGEMENT      [place] agency  [place]   Status of service:  Completed, signed off Medicare Important Message given?   (If response is "NO", the following Medicare IM given date fields will be blank) Date Medicare IM given:   Date Additional Medicare IM given:    Discharge Disposition:  [place]  Per UR Regulation:  Reviewed for med. necessity/level of care/duration of stay  If discussed at [place] of Stay Meetings, dates discussed:    Comments:  10-01-11 [place], RN,BSN [phone] CM did speak to pt and stated that she will use [facility] for services. CM did make referral for services. SOC to begin within 24-48 hours post d/c.    10-01-11 [address] Jacqlyn Krauss, RN,BSN [phone] CM did speak to liaison Ardis Rowan for [facility] and she will initiate the pt with the senior bridge program and the well dine program. CM will monitor for agency and make referral.

## 2011-10-01 NOTE — Progress Notes (Signed)
Stroke Team Progress Note  HISTORY  Yvonne Patel is an 66 y.o. female Mr. diabetes mellitus hypertension and stroke, presenting with recurrent dizziness as well as syncope. No seizure activity was described. No postictal state was described. CT scan of her head was unremarkable. MRI study today showed acute left paracentral pontine ischemic stroke. 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 Patient is on aspirin and Plavix daily. She had an echocardiogram on 09/29/2011 which was unremarkable. NIH stroke score at this point is 0.  SUBJECTIVE She is lying in bed.  Overall she feels her condition is completely resolved. She had urinated just before she became syncopal yesterday. She had diarrhea 3 weeks ago and she felt that she almost passed out like yesterday. Denies any problems with stroke or seizure.  OBJECTIVE Most recent Vital Signs: Filed Vitals:   09/30/11 2136 10/01/11 0000 10/01/11 0400 10/01/11 0800  BP: 182/80 158/76 146/72 162/78  Pulse: 80 76 73 78  Temp:  98.6 F (37 C) 98.5 F (36.9 C) 97.9 F (36.6 C)  TempSrc:  Oral Oral Oral  Resp:  18 18 20   Height:      Weight:      SpO2:  97% 98% 98%   CBG (last 3)   Basename 10/01/11 0735 09/30/11 2002 09/30/11 1652  GLUCAP 128* 136* 120*   Intake/Output from previous day: 07/25 0701 - 07/26 0700 In: 840 [P.O.:840] Out: -   IV Fluid Intake:     MEDICATIONS    . amLODipine  10 mg Oral Daily  . aspirin EC  81 mg Oral Daily  . atorvastatin  80 mg Oral q1800  . clopidogrel  75 mg Oral Daily  . insulin aspart  0-15 Units Subcutaneous TID WC  . insulin aspart  0-5 Units Subcutaneous QHS  . labetalol  200 mg Oral BID  . Liraglutide  1.2 mg Subcutaneous Daily  . lisinopril  20 mg Oral Daily  . LORazepam      . LORazepam  1 mg  Intravenous Once  . pantoprazole  40 mg Oral Q0600  . DISCONTD: sodium chloride   Intravenous Once   PRN:  acetaminophen, acetaminophen, alum & mag hydroxide-simeth, hydrALAZINE, HYDROcodone-acetaminophen, HYDROmorphone (DILAUDID) injection, ondansetron (ZOFRAN) IV, ondansetron  Diet:  Carb Control  liquids Activity:  Up with assistance DVT Prophylaxis: SCDs and plavix and baby aspirin.  CLINICALLY SIGNIFICANT STUDIES Basic Metabolic Panel:  Lab [id number] 0427 09/28/11 1238  NA 142 137  K 3.4* 3.3*  CL 106 97  CO2 24 27  GLUCOSE 141* 266*  BUN 21 20  CREATININE 1.17* 1.34*  CALCIUM 8.8 9.5  MG -- --  PHOS -- --   Liver Function Tests: No results found for this basename: AST:2,ALT:2,ALKPHOS:2,BILITOT:2,PROT:2,ALBUMIN:2 in the last 168 hours CBC:  Lab 09/29/11 0427 09/28/11 1238  WBC 7.1 6.4  NEUTROABS -- --  HGB 10.9* 13.3  HCT 32.1* 39.0  MCV 92.0 91.1  PLT 140* 153   Coagulation: No results found for this basename: LABPROT:4,INR:4 in the last 168 hours Cardiac Enzymes:  Lab 09/29/11 0500 09/28/11 2256 09/28/11 1742  CKTOTAL 91 92 95  CKMB 2.6 3.1 3.2  CKMBINDEX -- -- --  TROPONINI <0.30 <0.30 <0.30   Urinalysis:  Lab 09/29/11 0500  COLORURINE YELLOW  LABSPEC 1.017  PHURINE 5.5  GLUCOSEU  NEGATIVE  HGBUR NEGATIVE  BILIRUBINUR NEGATIVE  KETONESUR NEGATIVE  PROTEINUR 30*  UROBILINOGEN 1.0  NITRITE NEGATIVE  LEUKOCYTESUR SMALL*   Lipid Panel    Component Value Date/Time   CHOL 181 10/01/2011 0436   TRIG 120 10/01/2011 0436   HDL 42 10/01/2011 0436   CHOLHDL 4.3 10/01/2011 0436   VLDL 24 10/01/2011 0436   LDLCALC 115* 10/01/2011 0436   HgbA1C  Lab Results  Component Value Date   HGBA1C 8.2* 09/28/2011    Urine Drug Screen:   No results found for this basename: labopia, cocainscrnur, labbenz, amphetmu, thcu, labbarb    Alcohol Level: No results found for this basename: ETH:2 in the last 168 hours  Mr [facility] Contrast  09/30/2011  *RADIOLOGY REPORT*   Clinical Data:  Syncope.  Dizziness.  Nausea.  Hypertensive diabetic.  MRI BRAIN WITHOUT CONTRAST MRA HEAD WITHOUT CONTRAST  Technique: Multiplanar, multiecho pulse sequences of the brain and surrounding structures were obtained according to standard protocol without intravenous contrast.  Angiographic images of the head were obtained using MRA technique without contrast.  Comparison: 09/28/2011 CT.  01/03/2010 MR.  MRI HEAD  Findings:  Small acute non hemorrhagic left paracentral pontine infarct.  Remote basal ganglia infarcts bilaterally.  These appear to have been partially hemorrhagic otherwise no evidence of intracranial hemorrhage.  Mild to moderate small vessel disease type changes.  Anterior left parafalcine 6 mm calcification may represent calcification of the falx or tiny meningioma without mass effect, otherwise no intracranial mass lesion detected on this unenhanced exam.  No hydrocephalus.  Exophthalmos. The  Partially empty expanded sella.  This may be an incidental finding although also described in patients with pseudotumor cerebri.  IMPRESSION: Small acute non hemorrhagic left paracentral pontine infarct.  Please see above.  This has been made a PRA call report utilizing dashboard call feature.  MRA HEAD  Findings: 5 mm right periophthalmic aneurysm.  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  Moderate to marked tandem stenosis of the cavernous segment of the internal carotid artery greater on the left.  Mild irregularity and narrowing supraclinoid segment of the internal carotid artery bilaterally.  Mild narrowing proximal M1 segment left middle cerebral artery.  Middle cerebral artery branch vessel irregularity bilaterally.  Mild narrowing distal A1 segment right anterior cerebral artery.  Small caliber vertebral arteries bilaterally with moderate to marked narrowing bilaterally. Narrowed irregular PICA bilaterally.  High-grade stenosis/occlusion mid aspect  of the basilar artery.  Nonvisualization AICAs.  Fetal type origin of the posterior cerebral arteries.  Mild to moderate narrowing involving portions of the superior cerebellar arteries and posterior cerebral arteries.  IMPRESSION: 5 mm right periophthalmic aneurysm.  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  Moderate to marked tandem stenosis of the cavernous segment of the internal carotid artery greater on the left.  Mild irregularity and narrowing supraclinoid segment of the internal carotid artery bilaterally.  Mild narrowing proximal M1 segment left middle cerebral artery.  Middle cerebral artery branch vessel irregularity bilaterally.  Mild narrowing distal A1 segment right anterior cerebral artery.  Small caliber vertebral arteries bilaterally with moderate to marked narrowing bilaterally. Narrowed irregular PICA bilaterally.  High-grade stenosis/occlusion mid aspect of the basilar artery.  Nonvisualization AICAs.  Fetal type origin of the posterior cerebral arteries.  Mild to moderate narrowing involving portions of the superior cerebellar arteries and posterior cerebral arteries.  Original Report Authenticated By: Doug Sou, M.D.   Mr Brain  Wo Contrast  09/30/2011  *RADIOLOGY REPORT*  Clinical Data:  Syncope.  Dizziness.  Nausea.  Hypertensive diabetic.  MRI BRAIN WITHOUT CONTRAST MRA HEAD WITHOUT CONTRAST  Technique: Multiplanar, multiecho pulse sequences of the brain and surrounding structures were obtained according to standard protocol without intravenous contrast.  Angiographic images of the head were obtained using MRA technique without contrast.  Comparison: 09/28/2011 CT.  01/03/2010 MR.  MRI HEAD  Findings:  Small acute non hemorrhagic left paracentral pontine infarct.  Remote basal ganglia infarcts bilaterally.  These appear to have been partially hemorrhagic otherwise no evidence of intracranial hemorrhage.  Mild to moderate small vessel disease  type changes.  Anterior left parafalcine 6 mm calcification may represent calcification of the falx or tiny meningioma without mass effect, otherwise no intracranial mass lesion detected on this unenhanced exam.  No hydrocephalus.  Exophthalmos. The  Partially empty expanded sella.  This may be an incidental finding although also described in patients with pseudotumor cerebri.  IMPRESSION: Small acute non hemorrhagic left paracentral pontine infarct.  Please see above.  This has been made a PRA call report utilizing dashboard call feature.  MRA HEAD  Findings: 5 mm right periophthalmic aneurysm.  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  Moderate to marked tandem stenosis of the cavernous segment of the internal carotid artery greater on the left.  Mild irregularity and narrowing supraclinoid segment of the internal carotid artery bilaterally.  Mild narrowing proximal M1 segment left middle cerebral artery.  Middle cerebral artery branch vessel irregularity bilaterally.  Mild narrowing distal A1 segment right anterior cerebral artery.  Small caliber vertebral arteries bilaterally with moderate to marked narrowing bilaterally. Narrowed irregular PICA bilaterally.  High-grade stenosis/occlusion mid aspect of the basilar artery.  Nonvisualization AICAs.  Fetal type origin of the posterior cerebral arteries.  Mild to moderate narrowing involving portions of the superior cerebellar arteries and posterior cerebral arteries.  IMPRESSION: 5 mm right periophthalmic aneurysm.  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  Moderate to marked tandem stenosis of the cavernous segment of the internal carotid artery greater on the left.  Mild irregularity and narrowing supraclinoid segment of the internal carotid artery bilaterally.  Mild narrowing proximal M1 segment left middle cerebral artery.  Middle cerebral artery branch vessel irregularity  bilaterally.  Mild narrowing distal A1 segment right anterior cerebral artery.  Small caliber vertebral arteries bilaterally with moderate to marked narrowing bilaterally. Narrowed irregular PICA bilaterally.  High-grade stenosis/occlusion mid aspect of the basilar artery.  Nonvisualization AICAs.  Fetal type origin of the posterior cerebral arteries.  Mild to moderate narrowing involving portions of the superior cerebellar arteries and posterior cerebral arteries.  Original Report Authenticated By: Doug Sou, M.D.    CT of the brain Stable deep white matter chronic microvascular changes. Otherwise no acute intracranial process by noncontrast CT  EEG: This routine EEG done with the patient awake and drowsy is abnormal. There is very mild asymmetry of activities during sleep suggesting either a diffuse cortical abnormality of the left hemisphere or fluid collection over the same area, or less likely a skull defect in the right hemispheric region. No interictal epileptiform discharges were seen. If the patient has not had an MRI of the brain this may be useful to better determine the cause of her hemispheric asymmetry.   MRI/MRA of the brain  Small acute non hemorrhagic left paracentral pontine infarct.  2D Echocardiogram  LF [id number], grade 1 diastolic dysfunction.  Carotid Doppler ---  EKG  normal EKG, normal sinus rhythm, unchanged from previous tracings, normal sinus rhythm.   Therapy Recommendations PT ---, OT ---  Physical Exam   Pleasant middle aged African american lady not in distress.Awake alert. Afebrile. Head is nontraumatic. Neck is supple without bruit. Hearing is normal. Cardiac exam no murmur or gallop. Lungs are clear to auscultation. Distal pulses are well felt.  Neurological Exam : Awake  Alert oriented x 3. Normal speech and language.eye movements full without nystagmus.Fundi not visualized. Visual acuity is adequate. Face symmetric. Tongue midline. Normal strength, tone,  reflexes and coordination. Normal sensation. Gait deferred.  ASSESSMENT Ms. Rosalea Poff is a 66 y.o. female with a non hemorrhagic left paracentral pontine infarct in the setting of multiple stroke risk factors- likely from small vessel disease with volume depletion, dehydration and syncope precipitating it in setting of intracranial occlusive disease. On aspirin 81 mg orally every day and clopidogrel 75 mg orally every day prior to admission. Now on aspirin 81 mg orally every day and clopidogrel 75 mg orally every day for secondary stroke prevention. 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 1) acute left paracentral pontine ischemic stroke, on baby asa and plavix at home, continue. 2) Hypertension 3) PVD 4) Diabetes Mellitus 5) Hyperlipidemia  Hospital day # 3  TREATMENT/PLAN -Continue  aspirin 81 mg orally every day and clopidogrel 75 mg orally every day for secondary stroke prevention.  -Recommend continued hydration, which may have precipitated stroke such as urinary syncope, volume depletion with small intracranial blood vessels. Patient was told to remain hydrated. -Aggressive management of stroke risk factors--statin (LDL 115), HgbA1c 8.3 (oral med tx) -carotid dopplers pending -PT/OT -Passed bedside swallow, taking po's. D/C Speech Therapy. -No further neurologic intervention is recommended at this time. Please have the patient f/u with Dr. Leonie Man in 2 mos.  If further questions arise, please call or page at that time.  Thank you for allowing neurology to participate in the care of this patient.      Joesphine Bare, PAC,  MBA, [facility] Pager: [phone] 10/01/2011 10:05 AM  Scribe for Dr. Antony Contras, [facility] Director. He has personally reviewed chart, pertinent data, examined the patient and developed the  plan of care. Pager:  [phone]

## 2011-10-01 NOTE — Progress Notes (Signed)
VASCULAR LAB PRELIMINARY  PRELIMINARY  PRELIMINARY  PRELIMINARY  Carotid duplex completed.    Preliminary report: Right - 40% to 59% ICA stenosis upper end of range. High grade ECA stenosis. Vertebral artery flow is antegrade. Left -  60% to 79% ICA stenosis at the bulb mid range. High grade ECA stenosis. Vertebral artery flow is antegrade.  Shavona Gunderman, RVS 10/01/2011, 2:32 PM

## 2011-10-01 NOTE — Progress Notes (Signed)
Pts assessment unchanged from this am.Stoke info given and reviewed. Pt taken out to private vehicle with family via wheelchair

## 2011-10-01 NOTE — Progress Notes (Signed)
OT Discharge Note  Patient is being discharged from OT services secondary to:    No deficits noted   Pt reports no deficits   Pt is close to near baseline   Vision quickly assessed with breakfast tray navigation/ reading the clock  Please see latest Therapy Progress Note for current level of functioning and progress toward goals.  Progress and discharge plan and discussed with patient/caregiver and they    Agree  Spoke with PT megan regarding previous evaluation 09/30/11   Yvonne Patel   OTR/L Pager: [phone] Office: [phone] .

## 2011-10-01 NOTE — Procedures (Signed)
EEG Procedure Note  EEG:  13-1035  This routine EEG was requested in this 66 year old women who was admitted with recurrent syncope. There are no listed anti-convulsant medications.  The EEG was done with the patient awake and drowsy.  During maximal wakefulness she had an 11-12 cps posterior dominant rhythm that appeared symmetric.  Background activities were characterized by disorganized beta activities.  There was a no significant driving response to photic stimulation.  Hyperventilation was not performed.  Much of the tracing was spent in the drowsy state as characterized by a decrease in muscle activities, attenuation of the alpha rhythm and the appearance of diffuse theta activities with superimposed frontally dominant beta and alpha activities.  Vertex sharp waves were seen.  There did seem to be some attenuation of left sided beta activities and vertex sharp waves over the left hemisphere when compared to the right.  Impression:  This routine EEG done with the patient awake and drowsy is abnormal.  There is very mild asymmetry of activities during sleep suggesting either a diffuse cortical abnormality of the left hemisphere or fluid collection over the same area, or less likely a skull defect in the right hemispheric region.  No interictal epileptiform discharges were seen.  If the patient has not had an MRI of the brain this may be useful to better determine the cause of her hemispheric asymmetry.  Kavin Leech Jacelyn Grip, MD [facility], [place]

## 2011-10-18 ENCOUNTER — Ambulatory Visit: Payer: Medicare HMO | Admitting: Cardiovascular Disease

## 2011-11-12 ENCOUNTER — Ambulatory Visit: Payer: Medicare HMO | Admitting: Cardiovascular Disease

## 2012-01-14 ENCOUNTER — Other Ambulatory Visit (HOSPITAL_COMMUNITY): Payer: Self-pay | Admitting: Internal Medicine

## 2012-01-14 DIAGNOSIS — Z1231 Encounter for screening mammogram for malignant neoplasm of breast: Secondary | ICD-10-CM

## 2012-01-22 ENCOUNTER — Observation Stay (HOSPITAL_COMMUNITY)
Admission: EM | Admit: 2012-01-22 | Discharge: 2012-01-22 | Disposition: A | Discharge status: Home / Self Care | Payer: Medicare HMO | Attending: Emergency Medicine | Admitting: Emergency Medicine

## 2012-01-22 ENCOUNTER — Encounter (HOSPITAL_COMMUNITY): Payer: Self-pay | Admitting: Emergency Medicine

## 2012-01-22 DIAGNOSIS — I10 Essential (primary) hypertension: Secondary | ICD-10-CM | POA: Insufficient documentation

## 2012-01-22 DIAGNOSIS — Z7902 Long term (current) use of antithrombotics/antiplatelets: Secondary | ICD-10-CM | POA: Insufficient documentation

## 2012-01-22 DIAGNOSIS — E1142 Type 2 diabetes mellitus with diabetic polyneuropathy: Secondary | ICD-10-CM | POA: Insufficient documentation

## 2012-01-22 DIAGNOSIS — I739 Peripheral vascular disease, unspecified: Secondary | ICD-10-CM | POA: Insufficient documentation

## 2012-01-22 DIAGNOSIS — E1149 Type 2 diabetes mellitus with other diabetic neurological complication: Secondary | ICD-10-CM | POA: Insufficient documentation

## 2012-01-22 DIAGNOSIS — R739 Hyperglycemia, unspecified: Secondary | ICD-10-CM

## 2012-01-22 DIAGNOSIS — Z79899 Other long term (current) drug therapy: Secondary | ICD-10-CM | POA: Insufficient documentation

## 2012-01-22 DIAGNOSIS — Z8673 Personal history of transient ischemic attack (TIA), and cerebral infarction without residual deficits: Secondary | ICD-10-CM | POA: Insufficient documentation

## 2012-01-22 DIAGNOSIS — R55 Syncope and collapse: Principal | ICD-10-CM | POA: Insufficient documentation

## 2012-01-22 LAB — CBC WITH DIFFERENTIAL/PLATELET
Basophils Relative: 0 % (ref 0–1)
Eosinophils Absolute: 0.2 10*3/uL (ref 0.0–0.7)
HCT: 33.9 % — ABNORMAL LOW (ref 36.0–46.0)
Hemoglobin: 11.4 g/dL — ABNORMAL LOW (ref 12.0–15.0)
Lymphs Abs: 1.9 10*3/uL (ref 0.7–4.0)
MCH: 30.6 pg (ref 26.0–34.0)
MCHC: 33.6 g/dL (ref 30.0–36.0)
MCV: 90.9 fL (ref 78.0–100.0)
Monocytes Absolute: 0.4 10*3/uL (ref 0.1–1.0)
Monocytes Relative: 5 % (ref 3–12)
RBC: 3.73 MIL/uL — ABNORMAL LOW (ref 3.87–5.11)

## 2012-01-22 LAB — COMPREHENSIVE METABOLIC PANEL
ALT: 24 U/L (ref 0–35)
Alkaline Phosphatase: 118 U/L — ABNORMAL HIGH (ref 39–117)
BUN: 21 mg/dL (ref 6–23)
CO2: 27 mEq/L (ref 19–32)
GFR calc Af Amer: 59 mL/min — ABNORMAL LOW (ref >90)
GFR calc non Af Amer: 51 mL/min — ABNORMAL LOW (ref >90)
Glucose, Bld: 420 mg/dL — ABNORMAL HIGH (ref 70–99)
Potassium: 4.3 mEq/L (ref 3.5–5.1)
Sodium: 133 mEq/L — ABNORMAL LOW (ref 135–145)
Total Protein: 7 g/dL (ref 6.0–8.3)

## 2012-01-22 LAB — URINALYSIS, ROUTINE W REFLEX MICROSCOPIC
Glucose, UA: >1000 mg/dL — AB
Hgb urine dipstick: NEGATIVE
Protein, ur: 100 mg/dL — AB
Urobilinogen, UA: 0.2 mg/dL (ref 0.0–1.0)

## 2012-01-22 LAB — GLUCOSE, CAPILLARY
Glucose-Capillary: 389 mg/dL — ABNORMAL HIGH (ref 70–99)
Glucose-Capillary: 493 mg/dL — ABNORMAL HIGH (ref 70–99)
Glucose-Capillary: 431 mg/dL — ABNORMAL HIGH (ref 70–99)

## 2012-01-22 MED ORDER — SODIUM CHLORIDE 0.9 % IV SOLN
Freq: Once | INTRAVENOUS | Status: AC
  Administered 2012-01-22: 18:00:00 via INTRAVENOUS

## 2012-01-22 MED ORDER — SODIUM CHLORIDE 0.9 % IV BOLUS (SEPSIS)
1000.0000 mL | Freq: Once | INTRAVENOUS | Status: AC
  Administered 2012-01-22: 1000 mL via INTRAVENOUS

## 2012-01-22 MED ORDER — ONDANSETRON HCL 4 MG/2ML IJ SOLN: 4.0000 mg | Freq: Three times a day (TID) | INTRAMUSCULAR | Status: DC | PRN

## 2012-01-22 MED ORDER — DEXTROSE 50 % IV SOLN: 25.0000 mL | INTRAVENOUS | Status: DC | PRN

## 2012-01-22 MED ORDER — SODIUM CHLORIDE 0.9 % IV SOLN
INTRAVENOUS | Status: DC
Start: 2012-01-22 — End: 2012-01-22

## 2012-01-22 MED ORDER — DEXTROSE-NACL 5-0.45 % IV SOLN: INTRAVENOUS | Status: DC

## 2012-01-22 MED ORDER — INSULIN REGULAR BOLUS VIA INFUSION
0.0000 [IU] | Freq: Three times a day (TID) | INTRAVENOUS | Status: DC
  Filled 2012-01-22: qty 10

## 2012-01-22 MED ORDER — SODIUM CHLORIDE 0.9 % IV SOLN
Freq: Once | INTRAVENOUS | Status: AC
  Administered 2012-01-22: 16:00:00 via INTRAVENOUS

## 2012-01-22 MED ORDER — SODIUM CHLORIDE 0.9 % IV SOLN
INTRAVENOUS | Status: DC
  Administered 2012-01-22: 3.7 [IU]/h via INTRAVENOUS
  Filled 2012-01-22: qty 1

## 2012-01-22 MED ORDER — SODIUM CHLORIDE 0.9 % IV SOLN
INTRAVENOUS | Status: DC
  Filled 2012-01-22 (×2): qty 1

## 2012-01-22 NOTE — ED Notes (Signed)
Family at bedside. 

## 2012-01-22 NOTE — ED Provider Notes (Signed)
Yvonne Patel is a 66 y.o. female who is here for pre-syncope with prodrome of "feeling bad." Her glucose was high at admission and has gotten higher after eating. She's not toxic or ketotic. She is on stable dose of metformin and Victoza. She was taken off Actos.  She is alert, lucid and calm . Blood pressure at rest is low at 98/67. Admission blood pressure was slightly elevated.  The patient has had IV fluid bolus, and then will be started on the glucose stabilizer. She will be stable for discharge. If she can tolerate fluids, and has improved glucose. She will be managed in the CDU. She has an upcoming appointment with her doctor, this week.    Medical screening examination/treatment/procedure(s) were conducted as a shared visit with non-physician practitioner(s) and myself.  I personally evaluated the patient during the encounter  Richarda Blade, MD 01/22/12 2048

## 2012-01-22 NOTE — ED Provider Notes (Signed)
Medical screening examination/treatment/procedure(s) were conducted as a shared visit with non-physician practitioner(s) and myself.  I personally evaluated the patient during the encounter  Richarda Blade, MD 01/22/12 2049

## 2012-01-22 NOTE — ED Notes (Addendum)
Pt presents to Ed via EMS with complaints of syncope. Pt was in back seat of car when friends said she went unresponsive. When fire arrived on scene, patient was responding normally. Pt has had history of same and stated she needed to have a bowel movement in the past when she had syncopal episode. Pt had large bowel movement upon arrival to ED room 9. NAD.

## 2012-01-22 NOTE — ED Notes (Signed)
Patient is resting comfortably. 

## 2012-01-22 NOTE — ED Provider Notes (Signed)
3:32 PM Patient is in CDU under observation, hyperglycemia protocol.  This is a shared visit with Dr Eulis Foster.  Sign out received from [facility], PA-C.  Pt with episode of syncope, preceded by warmth and lightheadedness.  Reports similar episode in July with full workup that was negative.  Plan is for reduction of blood glucose < 300, PO trial, d/c home.    4:47 PM Patient currently feeling well, no needs at this time.  Awake and alert, multiple family members bedside.  CBG is 431.  Insulin has not yet been given.  Order has been placed.  RN Becky to start glucose stabilizer.    7:46 PM CBG now 261.  Pt feeling well.  Plan for discharge.  Pt has all medications at home and has an appointment with her PCP next Thursday.  Discussed all results with patient.  Pt verbalizes understanding and agrees with plan.   Pt given return precautions.    Results for orders placed during the hospital encounter of 01/22/12  CBC WITH DIFFERENTIAL      Component Value Range   WBC 8.0  4.0 - 10.5 K/uL   RBC 3.73 (*) 3.87 - 5.11 MIL/uL   Hemoglobin 11.4 (*) 12.0 - 15.0 g/dL   HCT 33.9 (*) 36.0 - 46.0 %   MCV 90.9  78.0 - 100.0 fL   MCH 30.6  26.0 - 34.0 pg   MCHC 33.6  30.0 - 36.0 g/dL   RDW 12.3  11.5 - 15.5 %   Platelets 161  150 - 400 K/uL   Neutrophils Relative 69  43 - 77 %   Neutro Abs 5.5  1.7 - 7.7 K/uL   Lymphocytes Relative 24  12 - 46 %   Lymphs Abs 1.9  0.7 - 4.0 K/uL   Monocytes Relative 5  3 - 12 %   Monocytes Absolute 0.4  0.1 - 1.0 K/uL   Eosinophils Relative 2  0 - 5 %   Eosinophils Absolute 0.2  0.0 - 0.7 K/uL   Basophils Relative 0  0 - 1 %   Basophils Absolute 0.0  0.0 - 0.1 K/uL  URINALYSIS, ROUTINE W REFLEX MICROSCOPIC      Component Value Range   Color, Urine YELLOW  YELLOW   APPearance HAZY (*) CLEAR   Specific Gravity, Urine 1.035 (*) 1.005 - 1.030   pH 5.0  5.0 - 8.0   Glucose, UA >1000 (*) NEGATIVE mg/dL   Hgb urine dipstick NEGATIVE  NEGATIVE   Bilirubin Urine SMALL (*)  NEGATIVE   Ketones, ur 15 (*) NEGATIVE mg/dL   Protein, ur 100 (*) NEGATIVE mg/dL   Urobilinogen, UA 0.2  0.0 - 1.0 mg/dL   Nitrite NEGATIVE  NEGATIVE   Leukocytes, UA NEGATIVE  NEGATIVE  COMPREHENSIVE METABOLIC PANEL      Component Value Range   Sodium 133 (*) 135 - 145 mEq/L   Potassium 4.3  3.5 - 5.1 mEq/L   Chloride 98  96 - 112 mEq/L   CO2 27  19 - 32 mEq/L   Glucose, Bld 420 (*) 70 - 99 mg/dL   BUN 21  6 - 23 mg/dL   Creatinine, Ser 1.10  0.50 - 1.10 mg/dL   Calcium 9.5  8.4 - 10.5 mg/dL   Total Protein 7.0  6.0 - 8.3 g/dL   Albumin 3.2 (*) 3.5 - 5.2 g/dL   AST 20  0 - 37 U/L   ALT 24  0 - 35 U/L  Alkaline Phosphatase 118 (*) 39 - 117 U/L   Total Bilirubin 0.3  0.3 - 1.2 mg/dL   GFR calc non Af Amer 51 (*) >90 mL/min   GFR calc Af Amer 59 (*) >90 mL/min  GLUCOSE, CAPILLARY      Component Value Range   Glucose-Capillary 389 (*) 70 - 99 mg/dL  GLUCOSE, CAPILLARY      Component Value Range   Glucose-Capillary 518 (*) 70 - 99 mg/dL   Comment 1 Notify RN    URINE MICROSCOPIC-ADD ON      Component Value Range   Squamous Epithelial / LPF FEW (*) RARE   Casts HYALINE CASTS (*) NEGATIVE  GLUCOSE, CAPILLARY      Component Value Range   Glucose-Capillary 493 (*) 70 - 99 mg/dL  GLUCOSE, CAPILLARY      Component Value Range   Glucose-Capillary 431 (*) 70 - 99 mg/dL   Comment 1 Call MD NNP PA CNM     Comment 2 Documented in Chart    GLUCOSE, CAPILLARY      Component Value Range   Glucose-Capillary 325 (*) 70 - 99 mg/dL  GLUCOSE, CAPILLARY      Component Value Range   Glucose-Capillary 261 (*) 70 - 99 mg/dL   No results found.    [place], [place] 01/22/12 1946

## 2012-01-22 NOTE — ED Notes (Signed)
Pt's CBG was 518 when I checked it. Dr. Eulis Foster was made aware of status. 2:39pm JG.

## 2012-01-22 NOTE — ED Notes (Signed)
Blood sugar 261 increased from 2.7units/h to 4 units/h

## 2012-01-22 NOTE — ED Provider Notes (Signed)
History     CSN: [id number]  Arrival date & time 01/22/12  1118   First MD Initiated Contact with Patient 01/22/12 1130      Chief Complaint  Patient presents with  . Near Syncope    (Consider location/radiation/quality/duration/timing/severity/associated sxs/prior treatment) HPI  Yvonne Patel is a 66 y.o. female complaining of syncopal event earlier in the day. Event was witnessed: Patient was in the back of a friend's car when her eyes rolled back and she became unresponsive to voice and touch for approximately 5-10 minutes. There was no shaking or loss of bowel or bladder control. Patient reports that she had a prodrome of feeling warm and dizzy. She denies chest pain, palpitations, shortness of breath, abdominal pain, nausea or vomiting, headache. Patient has been eating and drinking well. She has gotten up to use the bathroom since coming to the hospital and she does not report lightheaded sensation when going from sitting to standing. Patient had a prior syncopal episode in July. She has her first appointment with a cardiologist for followup for that event this week. Patient has not checked her blood sugar in a week because she ran out of lancets.    Past Medical History  Diagnosis Date  . Hypertension   . Stroke 2010  . Retinal disease   . Refusal of blood transfusions as patient is Jehovah's Witness   . Peripheral vascular disease   . Syncope   . Diabetes mellitus   . Neuromuscular disorder     neuropathy    Past Surgical History  Procedure Date  . Abdominal hysterectomy     partial  . Femoral artery stent     History reviewed. No pertinent family history.  History  Substance Use Topics  . Smoking status: Former Smoker    Types: Cigarettes    Quit date: 03/31/1975  . Smokeless tobacco: Never Used     Comment: quit about 40 years ago   . Alcohol Use: No    OB History    Grav Para Term Preterm Abortions TAB SAB Ect Mult Living                  Review  of Systems  Constitutional: Negative for fever.  Respiratory: Negative for shortness of breath.   Cardiovascular: Negative for chest pain.  Gastrointestinal: Negative for nausea, vomiting, abdominal pain and diarrhea.  Neurological: Positive for syncope.  All other systems reviewed and are negative.    Allergies  Review of patient's allergies indicates no known allergies.  Home Medications   Current Outpatient Rx  Name  Route  Sig  Dispense  Refill  . AMLODIPINE BESYLATE 10 MG PO TABS   Oral   Take 10 mg by mouth daily.         . ASPIRIN EC 81 MG PO TBEC   Oral   Take 81 mg by mouth daily.         Marland Kitchen CLOPIDOGREL BISULFATE 75 MG PO TABS   Oral   Take 75 mg by mouth daily.         Marland Kitchen LABETALOL HCL 200 MG PO TABS   Oral   Take 200 mg by mouth 2 (two) times daily.         Marland Kitchen LIRAGLUTIDE 18 MG/3ML Fidelity SOLN   Subcutaneous   Inject 1.2 mg into the skin daily.         Marland Kitchen LISINOPRIL 20 MG PO TABS   Oral   Take 20 mg by mouth  daily.         Marland Kitchen METFORMIN HCL 1000 MG PO TABS   Oral   Take 1,000 mg by mouth 2 (two) times daily with a meal.         . ROSUVASTATIN CALCIUM 40 MG PO TABS   Oral   Take 40 mg by mouth daily.           BP 149/69  Pulse 61  Temp 98.2 F (36.8 C) (Oral)  Resp 16  Ht 5\' 5"  (1.651 m)  Wt 175 lb (79.379 kg)  BMI 29.12 kg/m2  SpO2 100%  Physical Exam  Nursing note and vitals reviewed. Constitutional: She is oriented to person, place, and time. She appears well-developed and well-nourished. No distress.  HENT:  Head: Normocephalic and atraumatic.  Right Ear: External ear normal.  Left Ear: External ear normal.  Mouth/Throat: Oropharynx is clear and moist.  Eyes: Conjunctivae normal and EOM are normal. Pupils are equal, round, and reactive to light.  Neck: Normal range of motion.  Cardiovascular: Normal rate.   Pulmonary/Chest: Effort normal and breath sounds normal. No stridor. No respiratory distress. She has no wheezes. She  has no rales. She exhibits no tenderness.  Abdominal: Soft. Bowel sounds are normal. She exhibits no distension. There is no tenderness. There is no rebound and no guarding.  Musculoskeletal: Normal range of motion.  Neurological: She is alert and oriented to person, place, and time.       Strength is 5 out of 5x4 extremities, distal sensation is intact. She ambulates with a coordinated gait  Psychiatric: She has a normal mood and affect.    ED Course  Procedures (including critical care time)  Labs Reviewed  CBC WITH DIFFERENTIAL - Abnormal; Notable for the following:    RBC 3.73 (*)     Hemoglobin 11.4 (*)     HCT 33.9 (*)     All other components within normal limits  COMPREHENSIVE METABOLIC PANEL - Abnormal; Notable for the following:    Sodium 133 (*)     Glucose, Bld 420 (*)     Albumin 3.2 (*)     Alkaline Phosphatase 118 (*)     GFR calc non Af Amer 51 (*)     GFR calc Af Amer 59 (*)     All other components within normal limits  GLUCOSE, CAPILLARY - Abnormal; Notable for the following:    Glucose-Capillary 389 (*)     All other components within normal limits  GLUCOSE, CAPILLARY - Abnormal; Notable for the following:    Glucose-Capillary 518 (*)     All other components within normal limits  URINALYSIS, ROUTINE W REFLEX MICROSCOPIC  POCT CBG (FASTING - GLUCOSE)-MANUAL ENTRY   No results found.   Date: 01/22/2012  Rate: 63  Rhythm: normal sinus rhythm  QRS Axis: normal  Intervals: normal  ST/T Wave abnormalities: nonspecific ST/T changes  Conduction Disutrbances:none  Narrative Interpretation:   Old EKG Reviewed: unchanged    1. Syncope   2. Hyperglycemia without ketosis       MDM  66 y/o NIDDM with Syncopal event. Patient had prior workup and admission for same in July. Patient is not orthostatic her blood sugar is elevated and her EKG is unchanged from prior.  Blood sugar found to be 369. I will bolus a liter of normal saline.  Patient has an anion  gap of 8. After eating lunch her sugar was found to be 518. I will start her on glucose  stabilizer protocol and transfer her to CDU.   Plan is to discharge her home after her glucose stabilizes. This is a shared visit with attending Dr. Eulis Foster.  Report given to [place] in the CDU.     Monico Blitz, PA-C 01/22/12 1601

## 2012-01-22 NOTE — ED Notes (Signed)
CBG completed. 369.

## 2012-01-22 NOTE — ED Notes (Signed)
Meal tray ordered for patient.

## 2012-02-14 ENCOUNTER — Ambulatory Visit (HOSPITAL_COMMUNITY)
Admission: RE | Admit: 2012-02-14 | Discharge: 2012-02-14 | Disposition: A | Discharge status: Home / Self Care | Payer: Medicare HMO | Source: Ambulatory Visit | Attending: Internal Medicine | Admitting: Internal Medicine

## 2012-02-14 DIAGNOSIS — Z1231 Encounter for screening mammogram for malignant neoplasm of breast: Secondary | ICD-10-CM | POA: Insufficient documentation

## 2012-03-17 ENCOUNTER — Other Ambulatory Visit: Payer: Self-pay | Admitting: Cardiology

## 2012-03-17 DIAGNOSIS — I10 Essential (primary) hypertension: Secondary | ICD-10-CM

## 2012-03-28 ENCOUNTER — Ambulatory Visit
Admission: RE | Admit: 2012-03-28 | Discharge: 2012-03-28 | Disposition: A | Discharge status: Home / Self Care | Payer: Medicare Other | Source: Ambulatory Visit | Attending: Cardiology | Admitting: Cardiology

## 2012-03-28 DIAGNOSIS — I10 Essential (primary) hypertension: Secondary | ICD-10-CM

## 2012-03-29 ENCOUNTER — Other Ambulatory Visit: Payer: Self-pay | Admitting: Cardiology

## 2012-03-29 DIAGNOSIS — N289 Disorder of kidney and ureter, unspecified: Secondary | ICD-10-CM

## 2012-04-03 ENCOUNTER — Other Ambulatory Visit: Payer: Medicare Other

## 2012-04-04 ENCOUNTER — Ambulatory Visit
Admission: RE | Admit: 2012-04-04 | Discharge: 2012-04-04 | Disposition: A | Discharge status: Home / Self Care | Payer: Medicare Other | Source: Ambulatory Visit | Attending: Cardiology | Admitting: Cardiology

## 2012-04-04 DIAGNOSIS — N289 Disorder of kidney and ureter, unspecified: Secondary | ICD-10-CM

## 2012-06-05 ENCOUNTER — Encounter (HOSPITAL_COMMUNITY)
Admission: RE | Disposition: A | Discharge status: Home / Self Care | Payer: Self-pay | Source: Ambulatory Visit | Attending: Cardiology

## 2012-06-05 ENCOUNTER — Ambulatory Visit (HOSPITAL_COMMUNITY)
Admission: RE | Admit: 2012-06-05 | Discharge: 2012-06-05 | Disposition: A | Discharge status: Home / Self Care | Payer: Medicare Other | Source: Ambulatory Visit | Attending: Cardiology | Admitting: Cardiology

## 2012-06-05 DIAGNOSIS — E785 Hyperlipidemia, unspecified: Secondary | ICD-10-CM | POA: Insufficient documentation

## 2012-06-05 DIAGNOSIS — I701 Atherosclerosis of renal artery: Secondary | ICD-10-CM | POA: Insufficient documentation

## 2012-06-05 DIAGNOSIS — I70219 Atherosclerosis of native arteries of extremities with intermittent claudication, unspecified extremity: Secondary | ICD-10-CM | POA: Insufficient documentation

## 2012-06-05 DIAGNOSIS — I10 Essential (primary) hypertension: Secondary | ICD-10-CM | POA: Insufficient documentation

## 2012-06-05 DIAGNOSIS — E119 Type 2 diabetes mellitus without complications: Secondary | ICD-10-CM | POA: Insufficient documentation

## 2012-06-05 HISTORY — PX: LOWER EXTREMITY ANGIOGRAM: SHX5508

## 2012-06-05 HISTORY — PX: RENAL ANGIOGRAM: SHX5509

## 2012-06-05 LAB — GLUCOSE, CAPILLARY: Glucose-Capillary: 152 mg/dL — ABNORMAL HIGH (ref 70–99)

## 2012-06-05 LAB — POCT ACTIVATED CLOTTING TIME
Activated Clotting Time: 236 seconds
Activated Clotting Time: 219 seconds
Activated Clotting Time: 176 seconds

## 2012-06-05 SURGERY — ANGIOGRAM, LOWER EXTREMITY
Anesthesia: LOCAL

## 2012-06-05 MED ORDER — NITROGLYCERIN 1 MG/10 ML FOR IR/CATH LAB
INTRA_ARTERIAL | Status: AC
  Filled 2012-06-05: qty 10

## 2012-06-05 MED ORDER — SODIUM CHLORIDE 0.9 % IV SOLN
INTRAVENOUS | Status: DC
  Administered 2012-06-05: 06:00:00 via INTRAVENOUS

## 2012-06-05 MED ORDER — ONDANSETRON HCL 4 MG/2ML IJ SOLN: 4.0000 mg | Freq: Four times a day (QID) | INTRAMUSCULAR | Status: DC | PRN

## 2012-06-05 MED ORDER — METFORMIN HCL 1000 MG PO TABS: 1000.0000 mg | ORAL_TABLET | Freq: Two times a day (BID) | ORAL | Status: DC

## 2012-06-05 MED ORDER — SODIUM CHLORIDE 0.9 % IV SOLN: 1.0000 mL/kg/h | INTRAVENOUS | Status: DC

## 2012-06-05 MED ORDER — MIDAZOLAM HCL 2 MG/2ML IJ SOLN
INTRAMUSCULAR | Status: AC
  Filled 2012-06-05: qty 2

## 2012-06-05 MED ORDER — ACETAMINOPHEN 325 MG PO TABS: 650.0000 mg | ORAL_TABLET | ORAL | Status: DC | PRN

## 2012-06-05 MED ORDER — HYDROMORPHONE HCL PF 2 MG/ML IJ SOLN
INTRAMUSCULAR | Status: AC
  Filled 2012-06-05: qty 1

## 2012-06-05 MED ORDER — HEPARIN SODIUM (PORCINE) 1000 UNIT/ML IJ SOLN
INTRAMUSCULAR | Status: AC
  Filled 2012-06-05: qty 1

## 2012-06-05 MED ORDER — LIDOCAINE HCL (PF) 1 % IJ SOLN
INTRAMUSCULAR | Status: AC
  Filled 2012-06-05: qty 30

## 2012-06-05 NOTE — H&P (Signed)
  Please see paper chart  

## 2012-06-05 NOTE — CV Procedure (Signed)
Procedures performed:  Left Femoral Arterial access under ultrasound/ultrasound duplex guidance due to difficult access. Abdominal aortogram. Abdominal aortogram with bifemoral artery runoff. Selective left renal arteriogram. PTA and stenting of the left renal artery with implantation of a 3.5 x 12 mm and a 5.0 x 24 Veriflex stent to the left renal artery in the distal and mid and proximal segment for a high-grade 99% calcific stenosis.  Indication: Patient is a 67 year old female with H/O   DM , HTN , Claudication and abnormal LE arterial duplex. She also has difficult to control hypertension and has known left renal artery high-grade stenosis by prior peripheral angiography. She was brought to the peripheral angiography suite to evaluate her peripheral anatomy. Although consent was not signed for renal angioplasty, I had a long discussion prior to angiography and is well documented in my office visit note that she will need renal arteriogram and possible renal angioplasty if the high-grade stenosis is confirmed. I also had the discussions with the staff regarding this and we were all in agreement.  Peripheral arteriogram:  Abdominal aortogram:  No evidence of abdominal aneurysm. 2 renal arteries one on either side, a right lower artery showed a 20-30% stenosis. Left femoral artery showed high-grade 90-99% stenosis. This is a long segment stenosis in the mid and distal segment and ostium had a 30-40% stenosis.  Aortoiliac bifurcation was widely patent. the previously placed bilateral common iliac stents were widely patent.   Iliac arteries: There was nosignificant stenosis noted.  There is mild 20-30% in-stent restenosis in the right iliac artery stent.    Right Femoral arteriogram: This showed a 70-80% cauliflower-like common femoral artery stenosis followed by right SFA diffuse long segment stenosis of 80-90%. The popliteal artery was widely patent. Below the right knee there was one-vessel runoff in  the form of peroneal artery and posterior tibial and dorsalis pedis faintly collateralized at the level of the ankle.  Left femoral artery: The left common femoral artery was widely patent. The left special femoral artery had a proximal 90-95% stenosis followed by midsegment 80-90% stenosis. This is focal and less severe than the right femoral artery stenosis.   Interventional data:  PTA and stenting of the left renal artery with implantation of a 3.5 x 12 mm and a 5.0 x 24 Veriflex stent to the left renal artery in the distal and mid and proximal segment for a high-grade 99% calcific stenosis.  IMPRESSIONS:  1. PAD with symptomatic claudication. patient will need percutaneous revascularization via atherectomy of the right common femoral followed by right SFA. A filter device we'll have to be utilized to protect the single-vessel runoff in the right leg. Surgical option would be a last resort due to the fact that she has only 1 vessel runoff and along lesion which probably will increase restenosis rate. He also need left SFA angioplasty in a staged fashion. Serum creatinine needs to be followed through .188 cc of contrast was utilized for diagnostic and interventional procedure.   Recommendation: Patient will need continued aggressive modification. Patient will need aspirin indefinitely. Patient will be discharged in 8 hours if stable with OP f/u.    TECHNIQUE OF PROCEDURE: Under sterile precautions using a 5-French left femoral access using ultrasound guidance,  a 5 French Omni Flush catheter was advanced into the abdominal aorta over a Versacore wire. Abdominal aortogram was performed. The same catheter was utilized to perform bifemoral arteriogram. I then utilized a 5 [place] crossover catheter to selectively engage the left  artery and left renal arteriogram was performed.  Intervention: Using confined to correlation, I utilized a 6 Ell[place] 4 guide catheter to engage the left renal artery. 5 French  sheath was exchanged for a 6 [place] sheath prior to this. I was able to easily engage the left renal artery. Then I utilized a 4.0 x 20 mm aviator balloon and balloon angioplasty at 12 atmospheric pressure for 60 seconds was performed into the left mid and proximal renal artery. There was a dissection evident which was anticipated given the fact that patient had significant calcification. Then I proceeded with implantation of a 5.0 x 24 mm Veriflex stent which was deployed at 10 atmospheric pressure for 60 seconds. Intra-arterial nitroglycerin was also administered. There was a distal edge dissection at the end of the stenting. The proximal segment of the stent was also balloon antroplasty with the same balloon at 40 atmospheric pressure for 40 seconds.  At this point I decided to stent the distal segment which was into the renal pelvis with careful angiographic guidance. A 3.5 x 12 mm Veriflex was advanced into the left renal artery and positioned in place and deployed at 9 atmospheric pressure for 45 seconds. The stent balloon was then pulled proximally and a 17 atmospheric pressure for 32nd inflation was performed followed by exchanging the balloon to a 5.0 x 24 mm Veriflex stent balloon to dilate the proximal end of the distal stent at 8 atmospheric pressure for 30 seconds. Following this intra-arterial nitroglycerin was again  administered and angiography was performed. Excellent result was evident. Prior to angioplasty, there was very sluggish flow into the renal artery. Post procedure there was excellent blush and renal parenchyma was well seen. The guide catheter was then pulled out of the body over the guidewire. Left femoral arteriogram was performed to evaluate the arterial access. The arterial access and she will will occur in the holding area.

## 2012-06-05 NOTE — Interval H&P Note (Signed)
History and Physical Interval Note:  06/05/2012 7:37 AM  Yvonne Patel  has presented today for surgery, with the diagnosis of Caludication  The various methods of treatment have been discussed with the patient and family. After consideration of risks, benefits and other options for treatment, the patient has consented to  Procedure(s): LOWER EXTREMITY ANGIOGRAM (N/A) a and possible angioplasty s a surgical intervention .  The patient's history has been reviewed, patient examined, no change in status, stable for surgery.  I have reviewed the patient's chart and labs.  Questions were answered to the patient's satisfaction.     [name]

## 2012-06-06 DIAGNOSIS — I150 Renovascular hypertension: Secondary | ICD-10-CM

## 2012-07-06 ENCOUNTER — Encounter (HOSPITAL_COMMUNITY): Payer: Self-pay | Admitting: Pharmacy Technician

## 2012-07-10 MED ORDER — SODIUM CHLORIDE 0.9 % IV SOLN
INTRAVENOUS | Status: DC
  Administered 2012-07-11: 07:00:00 via INTRAVENOUS

## 2012-07-11 ENCOUNTER — Ambulatory Visit (HOSPITAL_COMMUNITY)
Admission: RE | Admit: 2012-07-11 | Discharge: 2012-07-11 | Disposition: A | Discharge status: Home / Self Care | Payer: Medicare Other | Source: Ambulatory Visit | Attending: Cardiology | Admitting: Cardiology

## 2012-07-11 ENCOUNTER — Encounter (HOSPITAL_COMMUNITY)
Admission: RE | Disposition: A | Discharge status: Home / Self Care | Payer: Self-pay | Source: Ambulatory Visit | Attending: Cardiology

## 2012-07-11 DIAGNOSIS — N189 Chronic kidney disease, unspecified: Secondary | ICD-10-CM | POA: Insufficient documentation

## 2012-07-11 DIAGNOSIS — G589 Mononeuropathy, unspecified: Secondary | ICD-10-CM | POA: Insufficient documentation

## 2012-07-11 DIAGNOSIS — Z87891 Personal history of nicotine dependence: Secondary | ICD-10-CM | POA: Insufficient documentation

## 2012-07-11 DIAGNOSIS — Z7902 Long term (current) use of antithrombotics/antiplatelets: Secondary | ICD-10-CM | POA: Insufficient documentation

## 2012-07-11 DIAGNOSIS — I7092 Chronic total occlusion of artery of the extremities: Secondary | ICD-10-CM | POA: Insufficient documentation

## 2012-07-11 DIAGNOSIS — I701 Atherosclerosis of renal artery: Secondary | ICD-10-CM | POA: Insufficient documentation

## 2012-07-11 DIAGNOSIS — E119 Type 2 diabetes mellitus without complications: Secondary | ICD-10-CM | POA: Insufficient documentation

## 2012-07-11 DIAGNOSIS — Z7982 Long term (current) use of aspirin: Secondary | ICD-10-CM | POA: Insufficient documentation

## 2012-07-11 DIAGNOSIS — Z794 Long term (current) use of insulin: Secondary | ICD-10-CM | POA: Insufficient documentation

## 2012-07-11 DIAGNOSIS — I70219 Atherosclerosis of native arteries of extremities with intermittent claudication, unspecified extremity: Secondary | ICD-10-CM | POA: Insufficient documentation

## 2012-07-11 DIAGNOSIS — I129 Hypertensive chronic kidney disease with stage 1 through stage 4 chronic kidney disease, or unspecified chronic kidney disease: Secondary | ICD-10-CM | POA: Insufficient documentation

## 2012-07-11 DIAGNOSIS — Z79899 Other long term (current) drug therapy: Secondary | ICD-10-CM | POA: Insufficient documentation

## 2012-07-11 HISTORY — PX: LOWER EXTREMITY ANGIOGRAM: SHX5508

## 2012-07-11 LAB — POCT ACTIVATED CLOTTING TIME
Activated Clotting Time: 252 seconds
Activated Clotting Time: 252 seconds
Activated Clotting Time: 225 seconds
Activated Clotting Time: 198 seconds
Activated Clotting Time: 187 seconds

## 2012-07-11 LAB — GLUCOSE, CAPILLARY

## 2012-07-11 SURGERY — ANGIOGRAM, LOWER EXTREMITY
Anesthesia: LOCAL

## 2012-07-11 MED ORDER — SODIUM CHLORIDE 0.9 % IV SOLN: 1.0000 mL/kg/h | INTRAVENOUS | Status: DC

## 2012-07-11 MED ORDER — METFORMIN HCL 1000 MG PO TABS: 1000.0000 mg | ORAL_TABLET | Freq: Two times a day (BID) | ORAL | Status: DC

## 2012-07-11 MED ORDER — FENTANYL CITRATE 0.05 MG/ML IJ SOLN: 25.0000 ug | Freq: Once | INTRAMUSCULAR | Status: DC

## 2012-07-11 MED ORDER — HEPARIN (PORCINE) IN NACL 2-0.9 UNIT/ML-% IJ SOLN
INTRAMUSCULAR | Status: AC
  Filled 2012-07-11: qty 1000

## 2012-07-11 MED ORDER — VERAPAMIL HCL 2.5 MG/ML IV SOLN
INTRAVENOUS | Status: AC
  Filled 2012-07-11: qty 2

## 2012-07-11 MED ORDER — FENTANYL CITRATE 0.05 MG/ML IJ SOLN
INTRAMUSCULAR | Status: AC
  Filled 2012-07-11: qty 2

## 2012-07-11 MED ORDER — ATROPINE SULFATE 1 MG/ML IJ SOLN
INTRAMUSCULAR | Status: AC
  Filled 2012-07-11: qty 1

## 2012-07-11 MED ORDER — ACETAMINOPHEN 325 MG PO TABS: 650.0000 mg | ORAL_TABLET | ORAL | Status: DC | PRN

## 2012-07-11 MED ORDER — NITROGLYCERIN 1 MG/10 ML FOR IR/CATH LAB
INTRA_ARTERIAL | Status: AC
  Filled 2012-07-11: qty 10

## 2012-07-11 MED ORDER — NITROGLYCERIN 1 MG/10 ML FOR IR/CATH LAB
INTRA_ARTERIAL | Status: AC
  Filled 2012-07-11: qty 30

## 2012-07-11 MED ORDER — LABETALOL HCL 5 MG/ML IV SOLN
10.0000 mg | Freq: Once | INTRAVENOUS | Status: AC
  Administered 2012-07-11: 10 mg via INTRAVENOUS

## 2012-07-11 MED ORDER — HEPARIN SODIUM (PORCINE) 1000 UNIT/ML IJ SOLN
INTRAMUSCULAR | Status: AC
  Filled 2012-07-11: qty 1

## 2012-07-11 MED ORDER — ONDANSETRON HCL 4 MG/2ML IJ SOLN
INTRAMUSCULAR | Status: AC
  Filled 2012-07-11: qty 2

## 2012-07-11 MED ORDER — LABETALOL HCL 5 MG/ML IV SOLN
INTRAVENOUS | Status: AC
  Filled 2012-07-11: qty 4

## 2012-07-11 MED ORDER — MIDAZOLAM HCL 2 MG/2ML IJ SOLN
INTRAMUSCULAR | Status: AC
  Filled 2012-07-11: qty 2

## 2012-07-11 MED ORDER — LIDOCAINE HCL (PF) 1 % IJ SOLN
INTRAMUSCULAR | Status: AC
  Filled 2012-07-11: qty 30

## 2012-07-11 MED ORDER — ONDANSETRON HCL 4 MG/2ML IJ SOLN
4.0000 mg | Freq: Four times a day (QID) | INTRAMUSCULAR | Status: DC | PRN
  Administered 2012-07-11: 4 mg via INTRAVENOUS

## 2012-07-11 NOTE — H&P (Signed)
Yvonne Patel is an 67 y.o. female.   Chief Complaint: Claudication HPI: Yvonne Patel is a 67 year old African American female with known peripheral arterial disease. She recently underwent peripheral arteriogram and angioplasty to her high-grade left renal artery stenosis on 06/06/2012 and was found to have high-grade bilateral SFA stenosis. Patient has symptomatic claudication. She is now scheduled for elective angioplasty of the SFA. No new symptoms since prior evaluation on 06/09/2012. No chest pain, no shortness breath, no PND or orthopnea. No bruit discoloration of her legs or toes or rest pain.  Past Medical History  Diagnosis Date  . Hypertension   . Stroke 2010  . Retinal disease   . Refusal of blood transfusions as patient is Jehovah's Witness   . Peripheral vascular disease   . Syncope   . Diabetes mellitus   . Neuromuscular disorder     neuropathy    Past Surgical History  Procedure Laterality Date  . Abdominal hysterectomy      partial  . Femoral artery stent      No family history on file. Social History:  reports that she quit smoking about 37 years ago. Her smoking use included Cigarettes. She started smoking about 47 years ago. She has a 5 pack-year smoking history. She has never used smokeless tobacco. She reports that  drinks alcohol. She reports that she does not use illicit drugs.  Allergies: No Known Allergies  Medications Prior to Admission  Medication Sig Dispense Refill  . amLODipine (NORVASC) 10 MG tablet Take 10 mg by mouth daily.      Marland Kitchen aspirin EC 81 MG tablet Take 81 mg by mouth daily.      . carvedilol (COREG) 6.25 MG tablet Take 6.25 mg by mouth 2 (two) times daily with a meal.      . cilostazol (PLETAL) 100 MG tablet Take 100 mg by mouth 2 (two) times daily.      . cloNIDine (CATAPRES) 0.1 MG tablet Take 0.1 mg by mouth 2 (two) times daily.      . clopidogrel (PLAVIX) 75 MG tablet Take 75 mg by mouth daily.      . insulin glargine (LANTUS)  100 UNIT/ML injection Inject 14 Units into the skin at bedtime.      Marland Kitchen labetalol (NORMODYNE) 200 MG tablet Take 200 mg by mouth 2 (two) times daily.      . Liraglutide (VICTOZA) 18 MG/3ML SOLN Inject 1.2 mg into the skin daily.      . metFORMIN (GLUCOPHAGE) 1000 MG tablet Take 1 tablet (1,000 mg total) by mouth 2 (two) times daily with a meal.      . rosuvastatin (CRESTOR) 40 MG tablet Take 40 mg by mouth daily.          Review of Systems - no bowel or bladder disturbances, no recent weight changes, no recurrence of TIA-like symptoms, not diabetic, right.  Blood pressure 150/79, pulse 84, temperature 97.4 F (36.3 C), temperature source Oral, resp. rate 18, height 5\' 5"  (1.651 m), weight 78.926 kg (174 lb), SpO2 100.00%. General appearance: alert, cooperative, appears stated age and no distress Eyes: conjunctivae/corneas clear. PERRL, EOM's intact. Fundi benign. Neck: no adenopathy, no carotid bruit, no JVD, supple, symmetrical, trachea midline and thyroid not enlarged, symmetric, no tenderness/mass/nodules Neck: JVP - normal, carotids 2+= without bruits Resp: clear to auscultation bilaterally Chest wall: no tenderness Cardio: regular rate and rhythm, S1, S2 normal, no murmur, click, rub or gallop GI: soft, non-tender; bowel sounds normal; no  masses,  no organomegaly Extremities: extremities normal, atraumatic, no cyanosis or edema Pulses: 2+ and symmetric Bilateral carotid bruit present, left is worse than right. Very prominent bilateral femoral arterial bruit with faint pulses. Popliteal and pedal pulses are absent bilaterally. There is no ulceration. Skin: Skin color, texture, turgor normal. No rashes or lesions Neurologic: Grossly normal  Results for orders placed during the hospital encounter of 07/11/12 (from the past 48 hour(s))  GLUCOSE, CAPILLARY     Status: Abnormal   Collection Time    07/11/12  6:33 AM      Result Value Range   Glucose-Capillary 136 (*) 70 - 99 mg/dL    Comment 1 Documented in Chart     Comment 2 Notify RN     No results found.  Labs:   Lab Results  Component Value Date   WBC 8.0 01/22/2012   HGB 11.4* 01/22/2012   HCT 33.9* 01/22/2012   MCV 90.9 01/22/2012   PLT 161 01/22/2012   No results found for this basename: NA, K, CL, CO2, BUN, CREATININE, CALCIUM, LABALBU, PROT, BILITOT, ALKPHOS, ALT, AST, GLUCOSE,  in the last 168 hours Lab Results  Component Value Date   CKTOTAL 91 09/29/2011   CKMB 2.6 09/29/2011   TROPONINI <0.30 09/29/2011    Lipid Panel     Component Value Date/Time   CHOL 181 10/01/2011 0436   TRIG 120 10/01/2011 0436   HDL 42 10/01/2011 0436   CHOLHDL 4.3 10/01/2011 0436   VLDL 24 10/01/2011 0436   LDLCALC 115* 10/01/2011 0436    Assessment/Plan 1. Peripheral arterial disease with symptomatic claudication. Right ABI 0.60, left ABI 0.66. Known bilateral SFA stenosis with high-grade stenosis in the right SFA and occluded left SFA. Patient scheduled for elective angioplasty of the same. 2. Renovascular hypertension, status post PTA and stenting of the left renal artery on 06/06/2012 with implantation of a overlapping 5.0 x 24 mm expressed and a 3.5 x 12 mm Veriflex stents. 4. Chronic renal insufficiency. 5. Carotid artery stenosis, bilateral. Asymptomatic.  Recommendation: All the complications, procedural risks, benefits and alternatives explained to the patient. He patient is willing to proceed.  [name], MD 07/11/2012, 7:36 AM [facility]. [place] Pager: [phone] Office: [phone] If no answer: Cell:  [phone]

## 2012-07-11 NOTE — Interval H&P Note (Signed)
History and Physical Interval Note:  07/11/2012 7:45 AM  Yvonne Patel  has presented today for surgery, with the diagnosis of claudication  The various methods of treatment have been discussed with the patient and family. After consideration of risks, benefits and other options for treatment, the patient has consented to  Procedure(s): LOWER EXTREMITY ANGIOGRAM (N/A) and possible angioplasty as a surgical intervention .  The patient's history has been reviewed, patient examined, no change in status, stable for surgery.  I have reviewed the patient's chart and labs.  Questions were answered to the patient's satisfaction.     [name]

## 2012-07-11 NOTE — CV Procedure (Signed)
Procedure performed 1. Left femoral arterial access and crossover from the left femoral artery into the right femoral artery and placement of catheter tip in the right femoral artery. 2. Placement of Ancel 7 French sheath into the right common femoral artery, placement of guidewire, CSI  Viper wire advance 0.017 by dear and 35 cm into the right peroneal artery below the knee, placement of NAV6 2.5-5.8 mm Emboshield embolic protection device in the popliteal artery due to single-vessel runoff below the knee.  the.  3. PTA and atherectomy with CSI Classic 2.0 mm Crown. 4. PTA and balloon angioplasty with a 5.0 x 220 mm Sterling balloon throughout the right SFA distal, mid, proximal 5. PTA and stenting of the mid segment of the right SFA with implantation of Mid SFA with Everflex 6.0 x 150 mm self-expanding stent. This was followed by balloon angioplasty at the stented segment of the same stent balloon.  Indication: Lifestyle limiting claudication, peripheral arterial disease, abnormal ABI. Please see history of present illness.  Interventional data: Successful PTA, atherectomy of the proximal SFA, PTA and successful balloon angioplasty of the distal SFA, PTA and successful stenting of the mid SFA with implantation of a 6 x 150 mm self-expanding Everflex stent with reduction of stenosis from 80-95% to 0% with brisk flow in the form of single vessel runoff below the right knee i.e. peroneal artery.  Technique: Under sterile precautions using a 5 French left femoral arterial access under fluoroscopy guidance, a 6 French Ansel sheath was advanced into the abdominal aorta and then into the right common femoral artery after having crossed it  with a 5 [place] crossover catheter over a Versacore wire. Using confined to correlation and maintaining ACT greater than 2/102, I then utilized a Merchant navy officer and a 5 [place] end hole catheter to advance into the distal right SFA, to leave a Versacore wire out I then  introduced the atherectomy Viper wire, followed by implantation of Emboshield embolic protection device into the popliteal artery. Then we proceeded with atherectomy using the 2.0 mm Crown as detailed above. Multiple passes at 60, 90, 120 thousand rpm's per minute with utilized and multiple passes were made in the proximal and midsegment of the right SFA. After having performed with a 2 in the proximal segment, I had difficulty in the midsegment where the burr would not spin and there was resistance noted. Hence the atherectomy catheter was pulled out of the body. I then performed angiogram followed by balloon angioplasty with the above-stated balloon, i.e. 5.0 x 220 mm Sterling balloon at 6 atmospheres pressure throughout the right SFA. Perform this angiography revealed spiral dissection the midsegment. Also there was a distal high-grade lesion that was not touched, hence I performed balloon antiplastic to distal SFA and repeat angiography did confirm spiral dissection the midsegment hence I sent this with the 6.0 x 1 50 mm self-expanding stent without any complications. Post stent deployment balloon angioplasty was performed with a 5 mm balloon at 6 atmospheric pressure for 60 seconds. Balloon inflations throughout the SFA was performed for 60 seconds each and in the proximal, ostial SFA for 2 minutes prior to proceeding with stenting of the midsegment. I then performed angiography, confirmed good results, and retrieved the embolic protection device without any complications. Repeat angiography confirmed excellent brisk flow in the distal SFA and popliteal artery. The 6 French sheath was then we'll gently into the left common femoral artery and sutured in place. Patient tolerated the procedure well. 134 cc of contrast  was utilized for diagnostic and interventional procedure. Is a prolonged procedure and a complex procedure.

## 2012-09-05 ENCOUNTER — Encounter (HOSPITAL_COMMUNITY): Payer: Self-pay | Admitting: Pharmacy Technician

## 2012-09-06 ENCOUNTER — Ambulatory Visit (INDEPENDENT_AMBULATORY_CARE_PROVIDER_SITE_OTHER): Payer: Self-pay | Admitting: *Deleted

## 2012-09-06 DIAGNOSIS — I639 Cerebral infarction, unspecified: Secondary | ICD-10-CM

## 2012-09-06 DIAGNOSIS — Z0289 Encounter for other administrative examinations: Secondary | ICD-10-CM

## 2012-09-07 NOTE — Progress Notes (Signed)
Patient was seen in the office for the Visit 6 follow up in the ACCELERATE trial. No reported changes in medication. During visit patient reported having a syncope episode while traveling in [place]. Reported being admitted to the hospital for a syncopal episode. Patient provide hospital records from [facility] which verified admission for syncope.

## 2012-09-19 ENCOUNTER — Encounter (HOSPITAL_COMMUNITY)
Admission: RE | Disposition: A | Discharge status: Home / Self Care | Payer: Self-pay | Source: Ambulatory Visit | Attending: Cardiology

## 2012-09-19 ENCOUNTER — Ambulatory Visit (HOSPITAL_COMMUNITY)
Admission: RE | Admit: 2012-09-19 | Discharge: 2012-09-19 | Disposition: A | Discharge status: Home / Self Care | Payer: Medicare Other | Source: Ambulatory Visit | Attending: Cardiology | Admitting: Cardiology

## 2012-09-19 DIAGNOSIS — Z7982 Long term (current) use of aspirin: Secondary | ICD-10-CM | POA: Insufficient documentation

## 2012-09-19 DIAGNOSIS — I6529 Occlusion and stenosis of unspecified carotid artery: Secondary | ICD-10-CM | POA: Insufficient documentation

## 2012-09-19 DIAGNOSIS — E119 Type 2 diabetes mellitus without complications: Secondary | ICD-10-CM | POA: Insufficient documentation

## 2012-09-19 DIAGNOSIS — I150 Renovascular hypertension: Secondary | ICD-10-CM | POA: Insufficient documentation

## 2012-09-19 DIAGNOSIS — I658 Occlusion and stenosis of other precerebral arteries: Secondary | ICD-10-CM | POA: Insufficient documentation

## 2012-09-19 DIAGNOSIS — I7092 Chronic total occlusion of artery of the extremities: Secondary | ICD-10-CM | POA: Insufficient documentation

## 2012-09-19 DIAGNOSIS — Z794 Long term (current) use of insulin: Secondary | ICD-10-CM | POA: Insufficient documentation

## 2012-09-19 DIAGNOSIS — Z7902 Long term (current) use of antithrombotics/antiplatelets: Secondary | ICD-10-CM | POA: Insufficient documentation

## 2012-09-19 DIAGNOSIS — Z87891 Personal history of nicotine dependence: Secondary | ICD-10-CM | POA: Insufficient documentation

## 2012-09-19 DIAGNOSIS — E7800 Pure hypercholesterolemia, unspecified: Secondary | ICD-10-CM | POA: Insufficient documentation

## 2012-09-19 DIAGNOSIS — Y831 Surgical operation with implant of artificial internal device as the cause of abnormal reaction of the patient, or of later complication, without mention of misadventure at the time of the procedure: Secondary | ICD-10-CM | POA: Insufficient documentation

## 2012-09-19 DIAGNOSIS — T82898A Other specified complication of vascular prosthetic devices, implants and grafts, initial encounter: Secondary | ICD-10-CM | POA: Insufficient documentation

## 2012-09-19 DIAGNOSIS — I70219 Atherosclerosis of native arteries of extremities with intermittent claudication, unspecified extremity: Secondary | ICD-10-CM | POA: Insufficient documentation

## 2012-09-19 DIAGNOSIS — Z8673 Personal history of transient ischemic attack (TIA), and cerebral infarction without residual deficits: Secondary | ICD-10-CM | POA: Insufficient documentation

## 2012-09-19 DIAGNOSIS — Z79899 Other long term (current) drug therapy: Secondary | ICD-10-CM | POA: Insufficient documentation

## 2012-09-19 DIAGNOSIS — I701 Atherosclerosis of renal artery: Secondary | ICD-10-CM | POA: Insufficient documentation

## 2012-09-19 HISTORY — PX: LOWER EXTREMITY ANGIOGRAM: SHX5508

## 2012-09-19 LAB — GLUCOSE, CAPILLARY: Glucose-Capillary: 163 mg/dL — ABNORMAL HIGH (ref 70–99)

## 2012-09-19 SURGERY — ANGIOGRAM, LOWER EXTREMITY
Anesthesia: LOCAL

## 2012-09-19 MED ORDER — SODIUM CHLORIDE 0.9 % IV SOLN: 1.0000 mL/kg/h | INTRAVENOUS | Status: DC

## 2012-09-19 MED ORDER — ACETAMINOPHEN 325 MG PO TABS: 650.0000 mg | ORAL_TABLET | ORAL | Status: DC | PRN

## 2012-09-19 MED ORDER — MIDAZOLAM HCL 2 MG/2ML IJ SOLN
INTRAMUSCULAR | Status: AC
  Filled 2012-09-19: qty 2

## 2012-09-19 MED ORDER — METFORMIN HCL 1000 MG PO TABS: 1000.0000 mg | ORAL_TABLET | Freq: Two times a day (BID) | ORAL | Status: DC

## 2012-09-19 MED ORDER — LABETALOL HCL 5 MG/ML IV SOLN
INTRAVENOUS | Status: AC
  Filled 2012-09-19: qty 4

## 2012-09-19 MED ORDER — POTASSIUM CHLORIDE CRYS ER 20 MEQ PO TBCR
EXTENDED_RELEASE_TABLET | ORAL | Status: AC
  Filled 2012-09-19: qty 1

## 2012-09-19 MED ORDER — ONDANSETRON HCL 4 MG/2ML IJ SOLN: 4.0000 mg | Freq: Four times a day (QID) | INTRAMUSCULAR | Status: DC | PRN

## 2012-09-19 MED ORDER — HYDROMORPHONE HCL PF 2 MG/ML IJ SOLN
INTRAMUSCULAR | Status: AC
  Filled 2012-09-19: qty 1

## 2012-09-19 MED ORDER — SODIUM CHLORIDE 0.9 % IV SOLN
INTRAVENOUS | Status: DC
  Administered 2012-09-19: 06:00:00 via INTRAVENOUS

## 2012-09-19 MED ORDER — HEPARIN SODIUM (PORCINE) 1000 UNIT/ML IJ SOLN
INTRAMUSCULAR | Status: AC
  Filled 2012-09-19: qty 1

## 2012-09-19 MED ORDER — LIDOCAINE HCL (PF) 1 % IJ SOLN
INTRAMUSCULAR | Status: AC
  Filled 2012-09-19: qty 30

## 2012-09-19 MED ORDER — POTASSIUM CHLORIDE CRYS ER 20 MEQ PO TBCR
20.0000 meq | EXTENDED_RELEASE_TABLET | Freq: Once | ORAL | Status: AC
  Administered 2012-09-19: 20 meq via ORAL

## 2012-09-19 MED ORDER — HEPARIN (PORCINE) IN NACL 2-0.9 UNIT/ML-% IJ SOLN
INTRAMUSCULAR | Status: AC
  Filled 2012-09-19: qty 1000

## 2012-09-19 NOTE — Interval H&P Note (Signed)
History and Physical Interval Note:  09/19/2012 8:41 AM  Yvonne Patel  has presented today for surgery, with the diagnosis of PVD  The various methods of treatment have been discussed with the patient and family. After consideration of risks, benefits and other options for treatment, the patient has consented to  Procedure(s): LOWER EXTREMITY ANGIOGRAM (N/A) and possible angioplasty as a surgical intervention .  The patient's history has been reviewed, patient examined, no change in status, stable for surgery.  I have reviewed the patient's chart and labs.  Questions were answered to the patient's satisfaction.     [name]

## 2012-09-19 NOTE — H&P (Signed)
  Please see office visit notes for complete details of HPI.  

## 2012-09-19 NOTE — CV Procedure (Signed)
Procedures performed: Right Femoral Arterial access.  Abdominal aortogram. Abdominal aortogram and crossover from  right into the left  femoral artery placement of catheter tip in the left common femoral artery and  femoral arteriogram with distal runoff. PTA and chocolate alone angioplasty of the left distal common femoral and left superficial femoral artery proximal segment with a 5.0 x 120 mm chocolate balloon.  Right femoral arteriogram and hemostasis of the right femoral arterial access with manual hold.  Indication: Patient is a 67 year old female with H/O   DM , HTN , Claudication and abnormal LE arterial duplex. Patient as previously has had right SFA angioplasty and had residual left SFA disease. Due to class III claudication, she is brought to the angiography suite to evaluate her peripheral anatomy with an eye towards percutaneous revascularization.  Peripheral arteriogram:  Abdominal aortogram:  No evidence of abdominal aneurysm. 2 renal arteries one on either side. Left renal artery which has previously placed stents shows high-grade ostial stenosis, in-stent restenosis. Brisk flow was evident to the renal arteries. Aortoiliac bifurcation was widely patent. There are left and right common iliac artery stents, and they're widely patent. There is mild luminal irregularity throughout the iliac arteries.  Left ffemoral runoff: The iliac arteries showed mild luminal irregularity. The left SFA showed high-grade stenosis on proximal segment with surrounding 80-90% stenosis. The origin of the left SFA also showed a 70-80% stenosis. Below the left knee there is one-vessel runoff in the form of peroneal artery. The anterior tibial artery is occluded in the midsegment and the peroneal artery is large vessel. Posterior tibial artery is occluded at its origin.  Interventional data: Successful PTA and  charted balloon angioplasty of the  left common femoral and left proximal and mid SFA with a 5.0 x 120  mm balloon. Stenosis reduced from 80-90% to 0%. In the proximal left SFA there was a non-flow-limiting small dissection which was left alone.  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 Patient had uncontrolled hypertension on presentation to the Cath Lab with a systolic blood pressure of 120/90 mm mercury. She will benefit from balloon angioplasty and relook at the left renal artery at some point especially if the blood pressure is not well controlled. Previously her serum creatinine was also 1.3 1.4, since renal angioplasty, her creatinine has been normal at 1.0. I anticipate significant improvement in symptoms of claudication of her left leg. She be discharged home today with outpatient followup.  TECHNICAL PROCEDURE: Under sterile precautions using a 5-French right femoral access a 5 French Omni Flush catheter was advanced into the abdominal aorta over a Versacore wire. Abdominal aortogram was performed. The same catheter was utilized to crossover from the right common femoral artery into the left common femoral artery to  perform  Left femoral arteriogram.  Contralateral cross over to the opposite femoral artery was performed via  Versacore wire. Tip of the wire was placed in the femoral artery and angiogram was performed. All catheter exchanges was performed over the wire.   Technique of intervention: I utilized a 7 [place] Ansel sheath, a 45 cm to cross over from the right common femoral artery into the left common femoral artery. Moderate amount of difficulty was experienced at the aortoiliac bifurcation at the crux. This was performed over a Versacore wire. Following this I exchanged a Versacore wire, to a [place] core 0.014 of an inch wire, placed the tip of the wire  into the proximal popliteal artery. Using heparin for anticoagulation advanced a 5.0 x 120 mm chocolate balloon and after carefully positioning the balloon under  fluoroscopy guidance, I performed intervention with balloon angioplasty performed at 4 atmospheric pressure for a total of 2 minutes. Transient high-pressure inflations were performed to break the high-grade stenosis especially in the proximal/ostial SFA and in the midsegment of the SFA. Following this angiography revealed excellent brisk flow all the way down to the ankle. The Ancel sheath was carefully pulled into the left common iliac artery origin and as I saw some gradient of 10-15 mm mercury, I performed left iliac arteriogram. Then the sheath was gently pulled back into the right common femoral artery and sutured in place. Patient tolerated the procedure well. A total of 100 cc of contrast was utilized for diagnostic angiography and angioplasty.

## 2012-09-20 LAB — POCT ACTIVATED CLOTTING TIME: Activated Clotting Time: 232 seconds

## 2012-11-07 ENCOUNTER — Ambulatory Visit: Payer: Self-pay

## 2012-11-14 ENCOUNTER — Telehealth: Payer: Self-pay | Admitting: *Deleted

## 2013-04-24 ENCOUNTER — Telehealth: Payer: Self-pay | Admitting: *Deleted

## 2013-04-24 NOTE — Telephone Encounter (Signed)
Note recorded.

## 2013-05-10 ENCOUNTER — Other Ambulatory Visit: Payer: Self-pay

## 2013-05-10 DIAGNOSIS — Z1231 Encounter for screening mammogram for malignant neoplasm of breast: Secondary | ICD-10-CM

## 2013-05-11 ENCOUNTER — Ambulatory Visit: Payer: Medicare Other

## 2013-05-11 ENCOUNTER — Ambulatory Visit
Admission: RE | Admit: 2013-05-11 | Discharge: 2013-05-11 | Disposition: A | Discharge status: Home / Self Care | Payer: Medicare FFS | Source: Ambulatory Visit

## 2013-05-11 DIAGNOSIS — Z1231 Encounter for screening mammogram for malignant neoplasm of breast: Secondary | ICD-10-CM

## 2013-06-20 NOTE — Telephone Encounter (Signed)
Closing encounter

## 2013-08-16 ENCOUNTER — Telehealth: Payer: Self-pay | Admitting: *Deleted

## 2013-08-16 NOTE — Telephone Encounter (Signed)
Message was left to contact [facility] staff.

## 2013-12-10 ENCOUNTER — Telehealth: Payer: Self-pay | Admitting: Neurology

## 2013-12-10 NOTE — Telephone Encounter (Signed)
Visit 11 phone call f/u for [employer].

## 2014-01-04 ENCOUNTER — Telehealth: Payer: Self-pay | Admitting: Neurology

## 2014-01-04 NOTE — Telephone Encounter (Signed)
Called patient to complete her Visit 19 phone call f/u for the [place].  Left message on her voicemail to return my call.

## 2014-02-14 ENCOUNTER — Encounter (HOSPITAL_COMMUNITY): Payer: Self-pay | Admitting: Cardiology

## 2014-04-24 ENCOUNTER — Encounter: Payer: Self-pay | Admitting: *Deleted

## 2014-04-24 ENCOUNTER — Encounter: Payer: Commercial Managed Care - HMO | Attending: Internal Medicine | Admitting: *Deleted

## 2014-04-24 VITALS — Ht 68.5 in | Wt 176.7 lb

## 2014-04-24 DIAGNOSIS — E119 Type 2 diabetes mellitus without complications: Secondary | ICD-10-CM | POA: Diagnosis not present

## 2014-04-24 DIAGNOSIS — Z794 Long term (current) use of insulin: Secondary | ICD-10-CM | POA: Diagnosis not present

## 2014-04-24 DIAGNOSIS — Z713 Dietary counseling and surveillance: Secondary | ICD-10-CM | POA: Diagnosis not present

## 2014-04-24 NOTE — Progress Notes (Signed)
Diabetes Self-Management Education  Visit Type:  New patient   Appt. Start Time: 1400 Appt. End Time: [id number]  04/24/2014  [name], identified by name and date of birth, is a 69 y.o. female with a diagnosis of Diabetes: Type 2.  Other people present during visit:  Patient . Patient has her own home. However, she has been staying with her daughter who has been assisting with her glucose management. She has been guiding her in better dietary decisions. During our meeting Yvonne Patel noted that she had forgotten to take her insulin this date. She administered Levemir 34units SQ abdomen. She noted that she does have difficulty remembering to take her medications. She utilizes a medication organization box which still causes her challenges.  ASSESSMENT  Height 5' 8.5" (1.74 m), weight 176 lb 11.2 oz (80.151 kg). Body mass index is 26.47 kg/(m^2).  Initial Visit Information:  Are you currently following a meal plan?: No Are you taking your medications as prescribed?: No (gets medications confused, forgets to take, not sure if she might have taken twice. Does use a pill organizer) How often do you need to have someone help you when you read instructions, pamphlets, or other written materials from your doctor or pharmacy?: 1 - Never  Psychosocial:  Patient Belief/Attitude about Diabetes: Motivated to manage diabetes Self-management support: Doctor's office, Family, CDE visits Other persons present: Patient Patient Concerns: Nutrition/Meal planning, Medication Preferred Learning Style: No preference indicated Learning Readiness: Change in progress  Complications:   How often do you check your blood sugar?: 1-2 times/day Fasting Blood glucose range (mg/dL): 130-179 (130ave) Postprandial Blood glucose range (mg/dL): 130-179 (150avg) Number of hypoglycemic episodes per month: 0 Number of hyperglycemic episodes per week: 0 (Not since she has been staying with her daughter) Have you had a  dilated eye exam in the past 12 months?: Yes Have you had a dental exam in the past 12 months?: No (full dentures)  Diet Intake:  Breakfast: egg 1-2, flat bread 1/4, bacon #1 / vegetable smoothie, Kale, berries, may skip Lunch: 1/2 bread, 1/2 [place], vegetables, left overs from dinner) Snack (afternoon): salad, lettuce, tomato, vinegar (cheese, chicken, tuna, eggs) Dinner: vegetable, salmon, bread, kidney beans 3/4C Snack (evening): rare: ice cream Beverage(s): water, hot tea with agave  Exercise:  Exercise: ADL's (has silver sneakers, will be returning to the gym when the weather is better to do water exercises)  Individualized Plan for Diabetes Self-Management Training:   Learning Objective:  Patient will have a greater understanding of diabetes self-management. Patient education plan per assessed needs and concerns is to attend individual sessions      Education Topics Reviewed with Patient Today:  Definition of diabetes, type 1 and 2, and the diagnosis of diabetes, Factors that contribute to the development of diabetes, Explored patient's options for treatment of their diabetes Role of diet in the treatment of diabetes and the relationship between the three main macronutrients and blood glucose level, Food label reading, portion sizes and measuring food., Carbohydrate counting, Information on hints to eating out and maintain blood glucose control., Meal options for control of blood glucose level and chronic complications. Role of exercise on diabetes management, blood pressure control and cardiac health. Reviewed patients medication for diabetes, action, purpose, timing of dose and side effects. Purpose and frequency of SMBG., Daily foot exams, Yearly dilated eye exam Relationship between chronic complications and blood glucose control, Assessed and discussed foot care and prevention of foot problems, Dental care  PATIENTS GOALS/Plan (Developed  by the patient):  Nutrition:  General guidelines for healthy choices and portions discussed Physical Activity: Exercise 3-5 times per week, 30 minutes per day Medications: take my medication as prescribed  Patient Instructions  Plan:  Aim for 2-3 Carb Choices per meal (30-45 grams) +/- 1 either way  Aim for 0-15 Carbs per snack if hungry  Include protein in moderation with your meals and snacks Consider reading food labels for Total Carbohydrate and Fat Grams of foods Consider  increasing your activity level by exercise for 30 minutes daily as tolerated Consider checking BG at alternate times per day as directed by MD  Try to take as directed by MD, Use pill box  ALWAYS HAVE PROTEIN AND CARBOHYDRATES TOGETHER CONSIDER HAVING A SNACK PRIOR TO GOING TO BED ALWAYS CARRY A SNACK WITH YOU WHEN YOU [place].   Expected Outcomes:  Demonstrated interest in learning. Expect positive outcomes  Education material provided: Living Well with Diabetes, A1C conversion sheet, Meal plan card, My Plate and Snack sheet  If problems or questions, patient to contact team via:  Phone  Future DSME appointment: PRN            [phone]

## 2014-04-24 NOTE — Patient Instructions (Addendum)
Plan:  Aim for 2-3 Carb Choices per meal (30-45 grams) +/- 1 either way  Aim for 0-15 Carbs per snack if hungry  Include protein in moderation with your meals and snacks Consider reading food labels for Total Carbohydrate and Fat Grams of foods Consider  increasing your activity level by exercise for 30 minutes daily as tolerated Consider checking BG at alternate times per day as directed by MD  Try to take as directed by MD, Use pill box  ALWAYS HAVE PROTEIN AND CARBOHYDRATES TOGETHER CONSIDER HAVING A SNACK PRIOR TO GOING TO BED ALWAYS CARRY A SNACK WITH [place].

## 2014-05-22 ENCOUNTER — Ambulatory Visit: Payer: Medicare FFS | Admitting: *Deleted

## 2014-06-21 ENCOUNTER — Other Ambulatory Visit: Payer: Self-pay

## 2014-06-21 DIAGNOSIS — Z1231 Encounter for screening mammogram for malignant neoplasm of breast: Secondary | ICD-10-CM

## 2014-06-27 ENCOUNTER — Ambulatory Visit: Payer: Medicare FFS

## 2014-07-08 ENCOUNTER — Ambulatory Visit
Admission: RE | Admit: 2014-07-08 | Discharge: 2014-07-08 | Disposition: A | Discharge status: Home / Self Care | Payer: Commercial Managed Care - HMO | Source: Ambulatory Visit

## 2014-07-08 ENCOUNTER — Ambulatory Visit: Payer: Medicare FFS

## 2014-07-08 DIAGNOSIS — Z1231 Encounter for screening mammogram for malignant neoplasm of breast: Secondary | ICD-10-CM

## 2014-07-16 ENCOUNTER — Ambulatory Visit: Payer: Medicare FFS

## 2015-01-16 ENCOUNTER — Other Ambulatory Visit: Payer: Self-pay | Admitting: Cardiology

## 2015-01-16 DIAGNOSIS — I701 Atherosclerosis of renal artery: Secondary | ICD-10-CM

## 2015-01-27 ENCOUNTER — Ambulatory Visit
Admission: RE | Admit: 2015-01-27 | Discharge: 2015-01-27 | Disposition: A | Discharge status: Home / Self Care | Payer: Commercial Managed Care - HMO | Source: Ambulatory Visit | Attending: Cardiology | Admitting: Cardiology

## 2015-01-27 DIAGNOSIS — I701 Atherosclerosis of renal artery: Secondary | ICD-10-CM

## 2015-03-10 NOTE — H&P (Signed)
OFFICE VISIT NOTES COPIED TO EPIC FOR DOCUMENTATION  Yvonne Patel 02/12/2015 8:37 AM Location: [facility] Patient #: [id number] DOB: Jul 21, 1945 Divorced / Language: Yvonne Patel / Race: Black or African American Female   History of Present Illness Yvonne Page MD; 02/12/2015 6:00 PM) Patient words: last o/v 1August 03, 202016; f/u renal duplex; Pt states she has had no new symptoms or concerns since she was here last o/v.  The patient is a 70 year old female who presents for a follow-up for Peripheral vascular disease. [name] with uncontrolled diabetes, has renovascular hypertension and renal atherosclerosis, has severe peripheral arterial disease. She has previously undergone peripheral angioplasty to SFA and bilateral iliac arteries. Patient has also undergone left renal artery stenting in 06/06/2012 and since then her blood pressure had been very well controlled until recently, has been having difficulty in BP control, hence I obtained renal artery duplex, presents here for follow-up. She states that her diabetes is improving, blood pressure has been stable. On her last office visit had increased the dose of clonidine.  She states that she has increased her physical activity and has not had significant claudication, has minimal calf discomfort but does not stop her from doing her activity. No ulceration in the legs or rest pain. No chest pain or dyspnea. Overall feels well.   Additional reasons for visit:  Follow-up for Hypertension    Problem List/Past Medical Yvonne Patel; 02/12/2015 3:14 PM) Peripheral artery disease (I73.9) PV angio 06/06/12: Left renal stenting. Peripheral arteriogram 07/11/2012: PTA and successful angioplasty of the right SFA following atherectomy and implantation of a 6.0 x 150 mm self-expanding stent. One vessel below the knee in the form of peroneal artery. Excellent results. 09/19/2012: PTA and chocolate balloon angioplasty of the left  distal common femoral and proximal and mid SFA . H/O PTA and stenting of the bilateral iliac arteries in 2010. Single vessel r/o (peroneal arteries bilateral). Benign essential hypertension (I10) 07/08/2014: HbA1c 7.7%, serum glucose 185, creatinine 1.24, eGFR 43, potassium 4.3, CMP normal Labs 02/15/2014: Potassium 3.9, BUN 20, creatinine 1.15, CMP otherwise normal. Labs 08/31/2013: BUN 22, potassium 3.7, creatinine 1.42, eGFR 44 mL. Echocardiogram done on 09/29/2011 reveals normal left ventricular systolic function ejection fraction 20-25%, mild diastolic dysfunction, moderately calcified mitral annulus. Moderately dilated left atrium. Stress nuclear 2010: Negative for ischemia. Cerebral arteriosclerosis with history of previous cerebrovascular accident (I67.2) 09/29/2011 MRI 5 mm right periophthalmic aneurysm. 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 Moderate to marked tandem stenosis of the cavernous segment of the internal carotid artery greater on the left. Mild irregularity and narrowing supraclinoid segment of the internal carotid artery bilaterally. No significant change with repeat scan June 1st 2014 when she presented with syncope in [place]. Carotid duplex 09/18/2012: Right carotid 50-69% and left Carotid >70% stenosis. No significnat change since 03/30/12. Recheck 6 months. Hyperlipidemia, group A (E78.00) Labs 02/15/2014: total cholesterol 194, triglycerides 72, HDL 44, LDL 99. Lipid profile 07/27/2013: Total cholesterol 230, triglycerides 119, HDL 32, LDL 164. CMP was normal. Carotid artery stenosis, bilateral Carotid artery duplex 12/03/2014: Right external carotid artery stenosis of >50%. Right internal carotid artery 50-69%. Left external carotid artery stenosis of >50%. Left internal carotid artery (50-69%). Right vertebral artery flow is dampened suggests probable stenosis. There is severe calcific plaque in bilateral carotid arteries and stenosis can  be underestimated. Follow up in six months is appropriate if clinically indicated. Compared to the study done on 05/28/2014, no significant change. Mixed hyperlipidemia (E78.2) NMR  LipoProfile performed on 05/30/2012: Total cholesterol 136, triglycerides 69, HDL 47, LDL 75. LDL particle number moderately elevated at 1402. LP-IR score was normal. 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 Labs 06/09/12: Blood sugar elevated at 148 mg. BUN 23, serum creatinine 1.13. Estimated GFR 58 mL. Electrolytes are normal. Labs are stable. Non-fasting BMP. Diabetes type 2, uncontrolled (E11.65) Labs 02/15/2014: HbA1c 10.2%. Labs 08/31/2013: HbA1c 10.6%. Pre-operative cardiovascular examination (Z01.810) Diabetes mellitus with peripheral artery disease (E11.51)  Allergies (Yvonne BrookBeane; [id number]:14 PM) No Known Drug Allergies11/21/2013  Family History (Yvonne BrookBeane; 112-16-20163:14 PM) Mother Deceased. at age 70-hadDiabetes Father Deceased. at age 608from natural causes. Siblings 5 (1 deceased)-Sister has heart problems?  Social History (Yvonne BrookBeane; 12016/12/163:14 PM) Current tobacco use Former smoker. quit in 1970 Non Drinker/No Alcohol Use Marital status Divorced. Living Situation Lives alone. Number of Children 5.  Past Surgical History (Yvonne BrookBeane; 1Dec 16, 20163:14 PM) PVangioplasty/right groin stent in 2010 and 2011 Hysterectomy (not due to cancer) -- [id number] Medication History (Yvonne BrookBeane; 12016/12/163:23 PM) CloNIDine HCl (0.1MG Tablet, 1 (one) Tablet Oral twice daily, Taken starting 1May 13, 202016) Active. Losartan Potassium-HCTZ (100-25MG Tablet, 1 (one) Tablet Tablet Tablet Oral every morning, Taken starting 05/26/2012) Active. Labetalol HCl (200MG Tablet, 1 Oral daily) Active. AmLODIPine Besylate (10MG Tablet, 1 Oral daily) Active. Plavix (75MG Tablet, 1  Oral daily) Active. Aspirin (81MG Tablet, 1 Oral daily) Active. MetFORMIN HCl (500MG Tablet, 2 Oral two times daily) Active. (Pt could not swallow 10082mpills, so she is taking 5001m two times daily. Starting 02-17-15-16ultivitamin (Oral daily) Active. Atorvastatin Calcium (40MG Tablet, 1 Oral at bedtime) Active. Levemir FlexTouch (100UNIT/ML Soln Pen-inj, 34units Subcutaneous daily) Active. Timolol Maleate (0.5% Solution, 1 drop ea eye Ophthalmic daily) Active. Glimepiride (1MG Tablet, 1 Oral daily) Active. Medications Reconciled (Verbally; Pt did not bring list or medicaitons)  Diagnostic Studies History (JaLaverda PageD; 02/2015/02/1615 PM) Renal Dopplers11/21/2016 There is an abnormal monophasic waveform throughout the left renal artery associated with decreased left kidney size to 9 cm from 10 cm. Persistent borderline elevated peak systolic velocity in the right renal artery, potentially indicated to of hemodynamically significant residual stenosis. Consider further workup Labwork 07/08/2014: HbA1c 7.7%, serum glucose 185, creatinine 1.24, eGFR 43, potassium 4.3, CMP normal. 07/08/2014: HbA1c 7.7%, serum glucose 185, creatinine 1.24, eGFR 43, potassium 4.3, CMP normal. Labs 02/15/2014: HbA1c 10.2%. 08/31/2013: HbA1c 10.6%. 07/08/2014: HbA1c 7.7%, serum glucose 185, creatinine 1.24, eGFR 43, potassium 4.3, CMP normal Labs 02/15/2014: Potassium 3.9, BUN 20, creatinine 1.15, CMP otherwise normal. Labs 08/31/2013: BUN 22, potassium 3.7, creatinine 1.42, eGFR 44 mL. Labs 02/15/2014: total cholesterol 194, triglycerides 72, HDL 44, LDL 99. Lipid profile 07/27/2013: Total cholesterol 230, triglycerides 119, HDL 32, LDL 164. CMP was normal.  Other Problems (ChAdonis Brookane; 02/17/15/201614 PM) H/o 2 TIA's in 2011 and 7/13. [facility] Echocardiogram findings abnormal, without diagnosis (R93.1)10/16/2012 Echo- 10/16/12 1.Left  ventricular internal dimension is decreased. Severe concentric hypertrophy with near cavity obliteration & outflow obstruction ( No gradient seen). Normal global wall motion. Normal systolic global function. Calculated EF 59%. Grade 1 diastolic function ( abnormal relaxation). 2.Moderate aortic valve leaflet calcification. 3.Moderate calcification of the mitral annulus. Mitral valve inflow E > A ratio. Mild mitral valve leaflet calcification. 4.Tricuspid valve structurally normal. Trace tricuspid regurgitation. H/O Doppler ultrasound (Z92.89)11/13/2012 Lower extremity arterial duplex 11/13/2012:  1. There is greater than 50% stenosis of the right and left profunda femoral artery. 2. Monophasic waveform throughout the right SFA and below the knee suggest right In-Flow disease, possibly iliac artery stenosis. There is diffuse plaque evident throughout the right lower extremity. 3. Biphasic waveforms throughout the left superficial femoral artery and below the knee suggest diffuse disease. 4. Mild decrease in bilateral ABI with 0.85 on the right and 0.96 and left. Compared to the study done on 03/27/2012 right In-Flow disease in the iliac artery is new. Otherwise no significant change. Bilateral common femoral artery velocities have decreased from greater than 50% stenosis less than 50% stenosis. ABI is improved on the right from 0.63 and and left from 0.66. The SFA stents appear to be patent, however the right iliac velocity decrease is new. Clinical correlation recommended. Recheck 6 months.    Review of Systems Yvonne Page, MD; 02/12/2015 6:3 PM) General Not Present- Fatigue, Fever and Weight Gain. HEENT Not Present- Headache. Respiratory Not Present- Bloody sputum and Hemoptysis. Cardiovascular Present- Claudications (mild calf and stable). Not Present- Fainting and Paroxysmal Nocturnal Dyspnea. Gastrointestinal Not Present- Black, Tarry Stool, Bloody Stool and Difficulty Swallowing. Musculoskeletal  Not Present- Joint Pain, Physical Disability and Swelling of Extremities. Neurological Not Present- Focal Neurological Symptoms. Psychiatric Not Present- Anxiety, Depression, Suicidal Ideation and Suicidal Planning. Endocrine Not Present- Cold Intolerance, Heat Intolerance, Polydipsia and Polyuria. Hematology Not Present- Abnormal Bleeding and Easy Bleeding. All other systems negative  Vitals Yvonne Patel; 02/12/2015 3:25 PM) 02/12/2015 3:15 PM Weight: 189.19 lb Height: 65in Body Surface Area: 1.93 m Body Mass Index: 31.48 kg/m  Pulse: 79 (Regular)  P.OX: 99% (Room air) BP: 148/78 (Sitting, Left Arm, Standard)       Physical Exam Yvonne Page, MD; 02/12/2015 6:3 PM) General Mental Status-Alert. General Appearance-Cooperative, Appears stated age, Not in acute distress. Orientation-Oriented X3. Build & Nutrition-Well nourished and Moderately built.  Head and Neck Thyroid Gland Characteristics - no palpable nodules, no palpable enlargement.  Chest and Lung Exam Palpation Tender - No chest wall tenderness. Auscultation Breath sounds - Clear.  Cardiovascular Inspection Jugular vein - Right - No Distention. Auscultation Heart Sounds - S1 WNL, S2 WNL and No gallop present. Murmurs & Other Heart Sounds: Murmur - Location - Aortic Area. Grade - II/VI. Character - Systolic Ejection.  Abdomen Palpation/Percussion Normal exam - Non Tender and No hepatosplenomegaly. Auscultation Normal exam - Bowel sounds normal.  Peripheral Vascular Lower Extremity Inspection - Left - No Pigmentation, No Varicose veins. Right - No Pigmentation, No Varicose veins. Palpation - Edema - Bilateral - No edema. Femoral pulse - Bilateral - 1+(PROMINENT BRUIT). Popliteal pulse - Bilateral - Feeble. Dorsalis pedis pulse - Bilateral - 0+(NORMAL CAPILLARY FILL). Posterior tibial pulse - Bilateral - 0+. Carotid arteries - Bilateral-Harsh Bruit. Abdomen-Epigastric bruit  present, No prominent abdominal aortic pulsation.  Neurologic Motor-Grossly intact without any focal deficits.  Musculoskeletal Global Assessment Left Lower Extremity - normal range of motion without pain. Right Lower Extremity - normal range of motion without pain.    Assessment & Plan Yvonne Page MD; 02/12/2015 6:03 PM) Peripheral artery disease (I73.9) Story: PV angio 06/06/12: Left renal stenting. Peripheral arteriogram 07/11/2012: PTA and successful angioplasty of the right SFA following atherectomy and implantation of a 6.0 x 150 mm self-expanding stent. One vessel below the knee in the form of peroneal artery. Excellent results. 09/19/2012: PTA and chocolate balloon angioplasty of the left distal common femoral and proximal and mid SFA . H/O  PTA and stenting of the bilateral iliac arteries in 2010. Single vessel r/o (peroneal arteries bilateral). Impression: Lower extremity arterial duplex 07/04/2014: Significant velocity increase at the right anterior tibial artery and right common and deep femoral artery suggestive of > 50% stenosis. Mild decrease in right leg perfusion with ABI 0.93. Diffuse heteregenous plaque. Left deep femoral artery with significant stenoses.This exam reveals normal perfusion of the left lower extremity with ABI 0.99. Compared to 06/26/2014, no significant changes noted. Previous right ABI was 0.99 and left ABI 1.05. The right CFA velocity has increased. Benign essential hypertension (I10) Story: Echocardiogram done on 09/29/2011 reveals normal left ventricular systolic function ejection fraction 88-41%, mild diastolic dysfunction, moderately calcified mitral annulus. Moderately dilated left atrium.  Stress nuclear 2010: Negative for ischemia. Impression: EKG 1November 13, 202016: Normal sinus rhythm at rate of 70 bpm, LVH with repolarization abnormality, cannot root inferior and lateral ischemia. Cannot exclude hypertrophic cardiomyopathy. No significant change from  EKG 07/18/2014. Labwork Story: 07/08/2014: HbA1c 7.7%, serum glucose 185, creatinine 1.24, eGFR 43, potassium 4.3, CMP normal. 07/08/2014: HbA1c 7.7%, serum glucose 185, creatinine 1.24, eGFR 43, potassium 4.3, CMP normal. Labs 02/15/2014: HbA1c 10.2%. 08/31/2013: HbA1c 10.6%.  07/08/2014: HbA1c 7.7%, serum glucose 185, creatinine 1.24, eGFR 43, potassium 4.3, CMP normal  Labs 02/15/2014: Potassium 3.9, BUN 20, creatinine 1.15, CMP otherwise normal. Labs 08/31/2013: BUN 22, potassium 3.7, creatinine 1.42, eGFR 44 mL.  Labs 02/15/2014: total cholesterol 194, triglycerides 72, HDL 44, LDL 99.  Lipid profile 07/27/2013: Total cholesterol 230, triglycerides 119, HDL 32, LDL 164. CMP was normal. 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 Impression: Renal artery duplex 01/27/2015: There is an abnormal monophasic waveform throughout the left renal artery associated with decreased left kidney size to 9 cm from 10 cm. Persistent borderline elevated peak systolic velocity in the right renal artery, potentially indicated to of hemodynamically significant residual stenosis. Consider further workup Current Plans Mechanism of underlying disease process and action of medications discussed with the patient. I discussed primary/secondary prevention and also dietary counceling was done. Peripheral arterial disease renal vascular hypertension. I reviewed the results of the recently performed renal duplex, suspect she has recurrent restenosis and progression of vascular disease. To preserved renal function, I have recommended that we proceed with renal angiography. She understands less than the percent risk of tissue loss, bleeding complication, acute renal injury and low risk for need for dialysis due to contrast nephropathy. She is willing to proceed. No changes in her medications were done today. Blood pressure still  remains elevated. Future Plans 166/08/3014 METABOLIC PANEL, BASIC ([id number] - one time 02/27/2015: CBC & PLATELETS (AUTO) [phone] - one time 02/27/2015: PT (PROTHROMBIN TIME) ([id number] - one time Addendum Note(Bridgette Allison AGNP-C; 03/03/2015 1:21 PM) 02/27/2015: serum glucose 137 (nonfasting), creatinine 1.22, eGFR 52, potassium 4.3, CBC normal, PT/INR normal  Labs stable to proceed with renal angiography.  Signed by JLaverda Page MD (02/12/2015 6:03 PM)

## 2015-03-11 ENCOUNTER — Encounter (HOSPITAL_COMMUNITY): Payer: Self-pay | Admitting: General Practice

## 2015-03-11 ENCOUNTER — Ambulatory Visit (HOSPITAL_COMMUNITY)
Admission: RE | Admit: 2015-03-11 | Discharge: 2015-03-12 | Disposition: A | Discharge status: Home / Self Care | Payer: PPO | Source: Ambulatory Visit | Attending: Cardiology | Admitting: Cardiology

## 2015-03-11 ENCOUNTER — Encounter (HOSPITAL_COMMUNITY)
Admission: RE | Disposition: A | Discharge status: Home / Self Care | Payer: Self-pay | Source: Ambulatory Visit | Attending: Cardiology

## 2015-03-11 DIAGNOSIS — T82858A Stenosis of vascular prosthetic devices, implants and grafts, initial encounter: Secondary | ICD-10-CM | POA: Insufficient documentation

## 2015-03-11 DIAGNOSIS — E1165 Type 2 diabetes mellitus with hyperglycemia: Secondary | ICD-10-CM | POA: Insufficient documentation

## 2015-03-11 DIAGNOSIS — I150 Renovascular hypertension: Secondary | ICD-10-CM | POA: Diagnosis not present

## 2015-03-11 DIAGNOSIS — I10 Essential (primary) hypertension: Secondary | ICD-10-CM | POA: Diagnosis not present

## 2015-03-11 DIAGNOSIS — I739 Peripheral vascular disease, unspecified: Secondary | ICD-10-CM | POA: Insufficient documentation

## 2015-03-11 DIAGNOSIS — E782 Mixed hyperlipidemia: Secondary | ICD-10-CM | POA: Diagnosis not present

## 2015-03-11 DIAGNOSIS — Z7984 Long term (current) use of oral hypoglycemic drugs: Secondary | ICD-10-CM | POA: Diagnosis not present

## 2015-03-11 DIAGNOSIS — Z7982 Long term (current) use of aspirin: Secondary | ICD-10-CM | POA: Diagnosis not present

## 2015-03-11 DIAGNOSIS — I701 Atherosclerosis of renal artery: Secondary | ICD-10-CM | POA: Insufficient documentation

## 2015-03-11 DIAGNOSIS — Y812 Prosthetic and other implants, materials and accessory general- and plastic-surgery devices associated with adverse incidents: Secondary | ICD-10-CM | POA: Diagnosis not present

## 2015-03-11 DIAGNOSIS — E1151 Type 2 diabetes mellitus with diabetic peripheral angiopathy without gangrene: Secondary | ICD-10-CM | POA: Diagnosis not present

## 2015-03-11 DIAGNOSIS — Z7902 Long term (current) use of antithrombotics/antiplatelets: Secondary | ICD-10-CM | POA: Diagnosis not present

## 2015-03-11 DIAGNOSIS — Z87891 Personal history of nicotine dependence: Secondary | ICD-10-CM | POA: Insufficient documentation

## 2015-03-11 DIAGNOSIS — I6523 Occlusion and stenosis of bilateral carotid arteries: Secondary | ICD-10-CM | POA: Insufficient documentation

## 2015-03-11 DIAGNOSIS — I672 Cerebral atherosclerosis: Secondary | ICD-10-CM | POA: Diagnosis not present

## 2015-03-11 HISTORY — DX: Peripheral vascular disease, unspecified: I73.9

## 2015-03-11 HISTORY — PX: PERIPHERAL VASCULAR CATHETERIZATION: SHX172C

## 2015-03-11 HISTORY — PX: RENAL ARTERY ANGIOPLASTY: SHX2317

## 2015-03-11 HISTORY — DX: Type 2 diabetes mellitus without complications: E11.9

## 2015-03-11 HISTORY — DX: Transient cerebral ischemic attack, unspecified: G45.9

## 2015-03-11 LAB — POCT ACTIVATED CLOTTING TIME
ACTIVATED CLOTTING TIME: 224 s
ACTIVATED CLOTTING TIME: 162 s
Activated Clotting Time: 209 seconds
Activated Clotting Time: 224 seconds
Activated Clotting Time: 188 seconds

## 2015-03-11 LAB — GLUCOSE, CAPILLARY
GLUCOSE-CAPILLARY: 71 mg/dL (ref 65–99)
GLUCOSE-CAPILLARY: 102 mg/dL — AB (ref 65–99)
Glucose-Capillary: 67 mg/dL (ref 65–99)
Glucose-Capillary: 80 mg/dL (ref 65–99)
Glucose-Capillary: 120 mg/dL — ABNORMAL HIGH (ref 65–99)

## 2015-03-11 LAB — NO BLOOD PRODUCTS

## 2015-03-11 SURGERY — RENAL ANGIOGRAPHY
Anesthesia: LOCAL

## 2015-03-11 MED ORDER — MIDAZOLAM HCL 2 MG/2ML IJ SOLN
INTRAMUSCULAR | Status: AC
  Filled 2015-03-11: qty 2

## 2015-03-11 MED ORDER — HEPARIN (PORCINE) IN NACL 2-0.9 UNIT/ML-% IJ SOLN
INTRAMUSCULAR | Status: AC
  Filled 2015-03-11: qty 1000

## 2015-03-11 MED ORDER — LIDOCAINE HCL (PF) 1 % IJ SOLN
INTRAMUSCULAR | Status: DC | PRN
  Administered 2015-03-11: 08:00:00

## 2015-03-11 MED ORDER — LABETALOL HCL 5 MG/ML IV SOLN
INTRAVENOUS | Status: AC
  Filled 2015-03-11: qty 4

## 2015-03-11 MED ORDER — HYDRALAZINE HCL 20 MG/ML IJ SOLN
INTRAMUSCULAR | Status: AC
  Filled 2015-03-11: qty 1

## 2015-03-11 MED ORDER — CLOPIDOGREL BISULFATE 75 MG PO TABS
75.0000 mg | ORAL_TABLET | Freq: Every day | ORAL | Status: DC
  Administered 2015-03-11 – 2015-03-12 (×2): 75 mg via ORAL
  Filled 2015-03-11 (×2): qty 1

## 2015-03-11 MED ORDER — LIDOCAINE HCL (PF) 1 % IJ SOLN
INTRAMUSCULAR | Status: AC
  Filled 2015-03-11: qty 30

## 2015-03-11 MED ORDER — SODIUM CHLORIDE 0.9 % IV SOLN
1.0000 mL/kg/h | INTRAVENOUS | Status: AC
  Administered 2015-03-11: 1 mL/kg/h via INTRAVENOUS

## 2015-03-11 MED ORDER — CLONIDINE HCL 0.1 MG PO TABS
0.1000 mg | ORAL_TABLET | Freq: Two times a day (BID) | ORAL | Status: DC
Start: 2015-03-11 — End: 2015-03-12
  Administered 2015-03-11 – 2015-03-12 (×3): 0.1 mg via ORAL
  Filled 2015-03-11 (×3): qty 1

## 2015-03-11 MED ORDER — MIDAZOLAM HCL 2 MG/2ML IJ SOLN
INTRAMUSCULAR | Status: DC | PRN
  Administered 2015-03-11: 2 mg via INTRAVENOUS
  Administered 2015-03-11: 1 mg via INTRAVENOUS

## 2015-03-11 MED ORDER — ATORVASTATIN CALCIUM 40 MG PO TABS
40.0000 mg | ORAL_TABLET | Freq: Every day | ORAL | Status: DC
  Administered 2015-03-11: 40 mg via ORAL
  Filled 2015-03-11 (×2): qty 1

## 2015-03-11 MED ORDER — NITROGLYCERIN 1 MG/10 ML FOR IR/CATH LAB
INTRA_ARTERIAL | Status: DC | PRN
  Administered 2015-03-11 (×2): 300 ug via INTRA_ARTERIAL

## 2015-03-11 MED ORDER — HYDRALAZINE HCL 20 MG/ML IJ SOLN
INTRAMUSCULAR | Status: DC | PRN
  Administered 2015-03-11: 10 mg via INTRAVENOUS

## 2015-03-11 MED ORDER — LABETALOL HCL 5 MG/ML IV SOLN
INTRAVENOUS | Status: DC | PRN
  Administered 2015-03-11 (×2): 20 mg via INTRAVENOUS

## 2015-03-11 MED ORDER — NITROGLYCERIN 1 MG/10 ML FOR IR/CATH LAB
INTRA_ARTERIAL | Status: AC
  Filled 2015-03-11: qty 10

## 2015-03-11 MED ORDER — AMLODIPINE BESYLATE 10 MG PO TABS
10.0000 mg | ORAL_TABLET | Freq: Every day | ORAL | Status: DC
  Administered 2015-03-11 – 2015-03-12 (×2): 10 mg via ORAL
  Filled 2015-03-11 (×2): qty 1

## 2015-03-11 MED ORDER — ONDANSETRON HCL 4 MG/2ML IJ SOLN: 4.0000 mg | Freq: Four times a day (QID) | INTRAMUSCULAR | Status: DC | PRN

## 2015-03-11 MED ORDER — IODIXANOL 320 MG/ML IV SOLN
INTRAVENOUS | Status: DC | PRN
  Administered 2015-03-11: 35 mL via INTRA_ARTERIAL

## 2015-03-11 MED ORDER — ATROPINE SULFATE 0.1 MG/ML IJ SOLN
INTRAMUSCULAR | Status: AC
  Filled 2015-03-11: qty 10

## 2015-03-11 MED ORDER — HEPARIN SODIUM (PORCINE) 1000 UNIT/ML IJ SOLN
INTRAMUSCULAR | Status: AC
  Filled 2015-03-11: qty 1

## 2015-03-11 MED ORDER — SODIUM CHLORIDE 0.9 % IV SOLN
INTRAVENOUS | Status: DC
  Administered 2015-03-11: 1000 mL via INTRAVENOUS

## 2015-03-11 MED ORDER — INSULIN DETEMIR 100 UNIT/ML ~~LOC~~ SOLN
34.0000 [IU] | Freq: Every day | SUBCUTANEOUS | Status: DC
  Administered 2015-03-11: 34 [IU] via SUBCUTANEOUS
  Filled 2015-03-11 (×2): qty 0.34

## 2015-03-11 MED ORDER — HYDRALAZINE HCL 20 MG/ML IJ SOLN
10.0000 mg | INTRAMUSCULAR | Status: DC | PRN
  Filled 2015-03-11: qty 1

## 2015-03-11 MED ORDER — HEPARIN SODIUM (PORCINE) 1000 UNIT/ML IJ SOLN
INTRAMUSCULAR | Status: DC | PRN
  Administered 2015-03-11: 1500 [IU] via INTRAVENOUS
  Administered 2015-03-11: 6000 [IU] via INTRAVENOUS
  Administered 2015-03-11: 2000 [IU] via INTRAVENOUS

## 2015-03-11 MED ORDER — GLIMEPIRIDE 1 MG PO TABS
1.0000 mg | ORAL_TABLET | Freq: Every day | ORAL | Status: DC
  Administered 2015-03-12: 09:00:00 1 mg via ORAL
  Filled 2015-03-11 (×3): qty 1

## 2015-03-11 MED ORDER — INSULIN ASPART 100 UNIT/ML ~~LOC~~ SOLN: 0.0000 [IU] | Freq: Every day | SUBCUTANEOUS | Status: DC

## 2015-03-11 MED ORDER — ASPIRIN EC 81 MG PO TBEC
81.0000 mg | DELAYED_RELEASE_TABLET | Freq: Every day | ORAL | Status: DC
  Administered 2015-03-11 – 2015-03-12 (×2): 81 mg via ORAL
  Filled 2015-03-11 (×2): qty 1

## 2015-03-11 MED ORDER — INSULIN ASPART 100 UNIT/ML ~~LOC~~ SOLN: 0.0000 [IU] | Freq: Three times a day (TID) | SUBCUTANEOUS | Status: DC

## 2015-03-11 MED ORDER — ACETAMINOPHEN 325 MG PO TABS: 650.0000 mg | ORAL_TABLET | ORAL | Status: DC | PRN

## 2015-03-11 MED ORDER — SODIUM CHLORIDE 0.9 % IV BOLUS (SEPSIS)
500.0000 mL | Freq: Once | INTRAVENOUS | Status: AC
  Administered 2015-03-11: 1000 mL via INTRAVENOUS

## 2015-03-11 MED ORDER — LABETALOL HCL 200 MG PO TABS
200.0000 mg | ORAL_TABLET | Freq: Two times a day (BID) | ORAL | Status: DC
Start: 2015-03-11 — End: 2015-03-12
  Administered 2015-03-11 (×2): 200 mg via ORAL
  Filled 2015-03-11 (×4): qty 1

## 2015-03-11 MED ORDER — INSULIN GLARGINE 100 UNIT/ML ~~LOC~~ SOLN: 10.0000 [IU] | Freq: Every day | SUBCUTANEOUS | Status: DC

## 2015-03-11 MED ORDER — FENTANYL CITRATE (PF) 100 MCG/2ML IJ SOLN
INTRAMUSCULAR | Status: AC
  Filled 2015-03-11: qty 2

## 2015-03-11 MED ORDER — FENTANYL CITRATE (PF) 100 MCG/2ML IJ SOLN
INTRAMUSCULAR | Status: DC | PRN
  Administered 2015-03-11 (×2): 25 ug via INTRAVENOUS

## 2015-03-11 MED ORDER — TIMOLOL MALEATE 0.5 % OP SOLG
1.0000 [drp] | Freq: Every day | OPHTHALMIC | Status: DC
  Administered 2015-03-11 – 2015-03-12 (×2): 1 [drp] via OPHTHALMIC
  Filled 2015-03-11: qty 5

## 2015-03-11 SURGICAL SUPPLY — 17 items
BALLN ANGIOSCULPT RX 3.5X15 (BALLOONS) ×2 IMPLANT
CATH ANGIO 5F PIGTAIL 100CM (CATHETERS) ×1 IMPLANT
CATH CROSS OVER TEMPO 5F (CATHETERS) ×1 IMPLANT
CATH SOFT-VU 4F 65 STRAIGHT (CATHETERS) ×1 IMPLANT
CATH SOFT-VU STRAIGHT 4F 65CM (CATHETERS) ×2
GUIDE CATH VISTA IMA 6F (CATHETERS) ×2 IMPLANT
KIT MICROINTRODUCER STIFF 5F (SHEATH) ×2 IMPLANT
KIT PV (KITS) ×2 IMPLANT
SHEATH PINNACLE 6F 10CM (SHEATH) ×1 IMPLANT
STENT HERCULINK RX 6.0X12X135 (Permanent Stent) ×1 IMPLANT
STENT HERCULINK RX 6.0X18X135 (Permanent Stent) ×1 IMPLANT
SYR MEDRAD MARK V 150ML (SYRINGE) ×2 IMPLANT
TRANSDUCER W/STOPCOCK (MISCELLANEOUS) ×2 IMPLANT
TRAY PV CATH (CUSTOM PROCEDURE TRAY) ×2 IMPLANT
TUBING CIL FLEX 10 FLL-RA (TUBING) ×1 IMPLANT
WIRE HITORQ VERSACORE ST 145CM (WIRE) ×1 IMPLANT
WIRE MAILMAN 182CM (WIRE) ×1 IMPLANT

## 2015-03-11 NOTE — Interval H&P Note (Signed)
History and Physical Interval Note:  03/11/2015 6:27 AM  Yvonne Patel  has presented today for surgery, with the diagnosis of renovascular hypertension  The various methods of treatment have been discussed with the patient and family. After consideration of risks, benefits and other options for treatment, the patient has consented to  Procedure(s): Renal Angiography (N/A) and possible PTA as a surgical intervention .  The patient's history has been reviewed, patient examined, no change in status, stable for surgery.  I have reviewed the patient's chart and labs.  Questions were answered to the patient's satisfaction.     [name]

## 2015-03-11 NOTE — Progress Notes (Signed)
Site area: right groin  Site Prior to Removal:  Level 0  Pressure Applied For 20 MINUTES    Minutes Beginning at 1250   Manual:   Yes.    Patient Status During Pull:  AAO X 4  Post Pull Groin Site:  Level 0  Post Pull Instructions Given:  Yes.    Post Pull Pulses Present:  Yes.    Dressing Applied:  Yes.    Comments:  Tolerated procedure well

## 2015-03-12 DIAGNOSIS — I701 Atherosclerosis of renal artery: Secondary | ICD-10-CM | POA: Diagnosis not present

## 2015-03-12 LAB — BASIC METABOLIC PANEL
Anion gap: 7 (ref 5–15)
BUN: 12 mg/dL (ref 6–20)
CALCIUM: 8.7 mg/dL — AB (ref 8.9–10.3)
CHLORIDE: 111 mmol/L (ref 101–111)
CO2: 26 mmol/L (ref 22–32)
CREATININE: 1.35 mg/dL — AB (ref 0.44–1.00)
GFR calc Af Amer: 45 mL/min — ABNORMAL LOW (ref >60)
GFR calc non Af Amer: 39 mL/min — ABNORMAL LOW (ref >60)
Glucose, Bld: 112 mg/dL — ABNORMAL HIGH (ref 65–99)
Potassium: 3.7 mmol/L (ref 3.5–5.1)
SODIUM: 144 mmol/L (ref 135–145)

## 2015-03-12 LAB — GLUCOSE, CAPILLARY: Glucose-Capillary: 91 mg/dL (ref 65–99)

## 2015-03-12 LAB — HEMOGLOBIN A1C
HEMOGLOBIN A1C: 9.9 % — AB (ref 4.8–5.6)
Mean Plasma Glucose: 237 mg/dL

## 2015-03-12 NOTE — Discharge Instructions (Signed)
Angiogram An angiogram is an X-ray test. It is used to look at your blood vessels. For this test, a dye is put into the blood vessel being checked. The dye shows up on X-rays. It helps your doctor see if there is a blockage or other problem in the blood vessel. BEFORE THE PROCEDURE  Follow your doctor's instructions about limiting what you eat or drink.  Ask your doctor if you may drink enough water to take any needed medicines the morning of the test.  Plan to have someone take you home after the test.  If you go home the same day as the test, plan to have someone stay with you for 24 hours. PROCEDURE   An IV tube will be put into one of your veins.  You will be given a medicine that makes you relax (sedative).  Your skin will be washed and shaved where the thin tube (catheter) will be inserted. This will usually be done in the upper part of your leg (groin). It may also be done in your arm near the elbow or in your wrist.  You will be given a medicine that numbs the area where the tube will be inserted (local anesthetic).  The tube will be inserted into a blood vessel.  Using a type of X-ray (fluoroscopy) to see, your doctor will move the tube into the blood vessel to check it.  Dye will be put in through the tube. X-rays of your blood vessels will then be taken. Different health care providers and hospitals may do this procedure differently. AFTER THE PROCEDURE   If the test is done through the leg, you will be kept in bed lying flat for several hours. You will be told to not bend or cross your legs.   The area where the tube was inserted will be checked often.   The pulse in your feet or wrist will be checked often.   More tests or X-rays may be done.    This information is not intended to replace advice given to you by your health care provider. Make sure you discuss any questions you have with your health care provider.   Document Released: 05/21/2008 Document  Revised: 03/15/2014 Document Reviewed: 07/26/2012 Elsevier Interactive Patient Education 2016 Else[employer]  Renal Artery Stenosis Renal artery stenosis (RAS) is narrowing of the artery that carries blood to your kidneys. It can affect one or both kidneys. Your kidneys filter waste and extra fluid from your blood. You get rid of the waste and fluid when you urinate. Your kidneys also make an important chemical messenger (hormone) called renin. Renin helps regulate your blood pressure. The first sign of RAS may be high blood pressure. Over time, other symptoms can develop. CAUSES  Plaque buildup in your arteries (atherosclerosis) is the main cause of RAS. The plaques that cause this are made up of:  Fat.  Cholesterol.  Calcium.  Other substances. As these substances build up in your renal artery, this slows the blood supply to your kidneys. The lack of blood and oxygen causes the signs and symptoms of RAS. A much less common cause of RAS is a disease called fibromuscular dysplasia. This disease causes abnormal cell growth that narrows the renal artery. It is not related to atherosclerosis. It occurs mostly in women who are 39-20 years old. It may be passed down through families. RISK FACTORS  You may be at risk for renal artery stenosis if you:  Are a man who is at  least 70 years old.  Are a woman who is at least 70 years old.  Have high blood pressure.  Have high cholesterol.  Are a smoker.  Abuse alcohol.  Have diabetes or prediabetes.  Are overweight.  Have a family history of early heart disease. SIGNS AND SYMPTOMS  RAS usually develops slowly. You may not have any signs or symptoms at first. The earliest signs may be:  Developing high blood pressure.  A sudden increase in existing high blood pressure.  No longer responding to medicine that used to control your blood pressure. Later signs and symptoms are due to kidney damage. They may  include:  Fatigue.  Shortness of breath.  Swollen legs and feet.  Dry skin.  Headaches.  Muscle cramps.  Loss of appetite.  Nausea or vomiting. DIAGNOSIS  Your health care provider may suspect RAS based on changes in your blood pressure and your risk factors. A physical exam will be done. Your health care provider may use a stethoscope to listen for a whooshing sound (bruit) that can occur where the renal artery is blocking blood flow. Several tests may be done to confirm a diagnosis of RAS. These may include:  Blood and urine tests to check your kidney function.  Imaging tests of your kidneys, such as:  A test that involves using sound waves to create an image of your kidneys and the blood flow to your kidneys (ultrasound).  A test in which dye is injected into one of your blood vessels so images can be taken as the dye flows through your renal arteries (angiogram). These tests can be done using X-rays, a CT scan (computed tomography angiogram, CTA), or a type of MRI (magnetic resonance angiogram, MRA). TREATMENT  Making lifestyle changes to reduce your risk factors is the first treatment option for early RAS. If the blood flow to one of your kidneys is cut by more than half, you may need medicine to:  Lower your blood pressure. This is the main medical treatment for RAS. You may need more than one type of medicine for this. The two types that work best for RAS are:  ACE inhibitors.  Angiotensin receptor blockers.  Reduce fluid in the body (diuretics).  Lower your cholesterol (statins). If medicine is not enough to control RAS, you may need surgery. This may involve:  Threading a tube with an inflatable balloon into the renal artery to force it open (angioplasty).  Removing plaque from inside the artery (endarterectomy). HOME CARE INSTRUCTIONS  Take medicines only as directed by your health care provider.  Make any lifestyle changes recommended by your health care  provider. This may include:  Working with a dietitian to maintain a heart-healthy diet. This type of diet is low in saturated fat, salt, and added sugar.  Starting an exercise program as directed by your health care provider.  Maintaining a healthy weight.  Quitting smoking.  Not abusing alcohol.  Keep all follow-up visits as directed by your health care provider. This is important. SEEK MEDICAL CARE IF:  Your symptoms of RAS are not getting better.  Your symptoms are changing or getting worse. SEEK IMMEDIATE MEDICAL CARE IF:  You have very bad pain in your back or abdomen.  You have blood in your urine.   This information is not intended to replace advice given to you by your health care provider. Make sure you discuss any questions you have with your health care provider.   Document Released: 11/18/2004 Document Revised: 03/15/2014  Document Reviewed: 06/07/2013 Elsevier Interactive Patient Education [employer].

## 2015-03-12 NOTE — Discharge Summary (Signed)
Physician Discharge Summary  Patient ID: Yvonne Patel MRN: [id number] DOB/AGE: 11/14/1945 70 y.o.  Admit date: 03/11/2015 Discharge date: 03/12/2015  Primary Discharge Diagnosis: Renovascular Hypertension  Secondary Discharge Diagnosis: PAD, Hyperlipidemia, Uncontrolled Type II Diabetes, Asymptomatic bilateral carotid artery stenosis  Significant Diagnostic Studies: PV angio 03/10/2014: Right renal artery Proximal and midsegment stenting with a 6.0 x 19 mm overlap with a 6.0 x 12 mm Herculink stent. Stenosis reduced from 80% to 0% with no residual pressure gradient, there was a 60-80 mmHg pressure gradient preprocedure. Left renal stent performed on 06/06/2012 shows mod restenosis without PG (not significant).   Hospital Course: She is a 70 y.o. AA female with uncontrolled diabetes, hyperlipidemia, renovascular hypertension and renal atherosclerosis, and severe peripheral arterial disease. She had previously undergone peripheral angioplasty to SFA and bilateral iliac arteries.  Patient has also undergone left renal artery stenting in 06/06/2012 and since then her blood pressure had been very well controlled until recently, she had been having difficulty in BP control, hence I obtained renal artery duplex and increased the dose of clonidine. Renal artery duplex on 01/27/2015 revealed an abnormal monophasic waveform throughout the left renal artery associated with decreased left kidney size to 9 cm from 10 cm, as well as persistent borderline elevated peak systolic velocity in the right renal artery, potentially indicative of hemodynamically significant residual stenosis. To preserve renal function and better control blood pressure, it was recommended that she proceed with renal angiography.  She underwent successful proximal and midsegment stenting with a 6.0 x 19 mm overlap with a 6.0 x 12 mm Herculink stent. Blood pressure now within normal limits today. No new symptoms or concerns.    Recommendations  on discharge: Follow up next week as scheduled, will obtain baseline renal artery duplex at follow-up and repeat this in 3 months. Continue present medications.   Discharge Exam: Blood pressure 125/72, pulse 68, temperature 97.7 F (36.5 C), temperature source Oral, resp. rate 16, height 5' 5.5" (1.664 m), weight 84.8 kg (186 lb 15.2 oz), SpO2 99 %.    General appearance: alert, cooperative, appears stated age, no distress and mildly obese Neck: bilateral carotid bruit Resp: clear to auscultation bilaterally Cardio: regular rate and rhythm, S1, S2 normal, no S3 or S4 and systolic murmur: systolic ejection 2/6,   at 2nd right intercostal space Extremities: extremities normal, atraumatic, no cyanosis or edema Pulses: Bilateral femoral pulses 1-2+ with loud bruit, bilateral popliteal pulses feeble, bilateral PT/DP pulses absent Skin: Skin color, texture, turgor normal. No rashes or lesions Neurologic: Grossly normal  Right femoral access site without hematoma or tenderness  Labs:   Lab Results  Component Value Date   WBC 8.0 01/22/2012   HGB 11.4* 01/22/2012   HCT 33.9* 01/22/2012   MCV 90.9 01/22/2012   PLT 161 01/22/2012    Recent Labs Lab 03/12/15 0307  NA 144  K 3.7  CL 111  CO2 26  BUN 12  CREATININE 1.35*  CALCIUM 8.7*  GLUCOSE 112*    Lipid Panel     Component Value Date/Time   CHOL 181 10/01/2011 0436   TRIG 120 10/01/2011 0436   HDL 42 10/01/2011 0436   CHOLHDL 4.3 10/01/2011 0436   VLDL 24 10/01/2011 0436   LDLCALC 115* 10/01/2011 0436    BNP (last 3 results) No results for input(s): BNP in the last 8760 hours.  HEMOGLOBIN A1C Lab Results  Component Value Date   HGBA1C 9.9* 03/11/2015   MPG 237 03/11/2015  Cardiac Panel (last 3 results) No results for input(s): CKTOTAL, CKMB, TROPONINI, RELINDX in the last 8760 hours.  Lab Results  Component Value Date   CKTOTAL 91 09/29/2011   CKMB 2.6 09/29/2011   TROPONINI <0.30 09/29/2011     TSH No  results for input(s): TSH in the last 8760 hours.  EKG 03/10/2014: Sinus rhythm at a rate of 66 bpm, LVH with repolarization abnormality, cannot exclude inferior and lateral ischemia.  No significant change from EKG on 07/18/2014.   Radiology: No results found.    FOLLOW UP PLANS AND APPOINTMENTS    Medication List    TAKE these medications        amLODipine 10 MG tablet  Commonly known as:  NORVASC  Take 10 mg by mouth daily.     aspirin EC 81 MG tablet  Take 81 mg by mouth daily.     atorvastatin 40 MG tablet  Commonly known as:  LIPITOR  Take 40 mg by mouth daily.     cloNIDine 0.1 MG tablet  Commonly known as:  CATAPRES  Take 0.1 mg by mouth 2 (two) times daily.     clopidogrel 75 MG tablet  Commonly known as:  PLAVIX  Take 75 mg by mouth daily.     glimepiride 1 MG tablet  Commonly known as:  AMARYL  Take 1 mg by mouth daily with breakfast.     insulin detemir 100 UNIT/ML injection  Commonly known as:  LEVEMIR  Inject 34 Units into the skin at bedtime. Add 2 units if over 150     labetalol 200 MG tablet  Commonly known as:  NORMODYNE  Take 200 mg by mouth 2 (two) times daily.     losartan-hydrochlorothiazide 100-25 MG tablet  Commonly known as:  HYZAAR  Take 1 tablet by mouth daily.     metFORMIN 500 MG tablet  Commonly known as:  GLUCOPHAGE  Take 1,000 mg by mouth 2 (two) times daily.     timolol 0.5 % ophthalmic gel-forming  Commonly known as:  TIMOPTIC-XR  Place 1 drop into both eyes daily.       Follow-up Information    Follow up with [name], MD On 03/19/2015.   Specialty:  Cardiology   Why:  at 3:00pm   Contact information:   [address] [place] [address] [phone]        Rachel Bo, NP-C 03/12/2015, 8:11 AM  Pager: [phone] Office: [phone] If no answer: [phone]

## 2015-03-12 NOTE — Care Management Note (Addendum)
Case Management Note  Patient Details  Name: Yvonne Patel MRN: [id number] Date of Birth: [birth year]  Subjective/Objective:    HTN, PV angio 03/10/2014  Action/Plan: NCM spoke to pt and dtr's at bedside. Pt states she can afford her medications. She does not need any DME for home. She currently drives. She plans to follow up with her health plan for free gym membership. Dtr had questions regarding diet. Explained pt with need to follow a Heart Healthy Diet, monitoring sodium, and fat. Pt states she does eat out frequently, but daughter states they plan to make changes.   Expected Discharge Date:  03/12/2015               Expected Discharge Plan:  Home/Self Care  In-House Referral:  NA  Discharge planning Services  CM Consult, NA  Post Acute Care Choice:  NA Choice offered to:  NA  DME Arranged:  N/A DME Agency:  NA  HH Arranged:  NA HH Agency:  NA  Status of Service:  Completed, signed off  Medicare Important Message Given:    Date Medicare IM Given:    Medicare IM give by:    Date Additional Medicare IM Given:    Additional Medicare Important Message give by:     If discussed at [place] of Stay Meetings, dates discussed:    Additional Comments:  Erenest Rasher, RN 03/12/2015, 10:56 AM

## 2015-03-19 DIAGNOSIS — I10 Essential (primary) hypertension: Secondary | ICD-10-CM | POA: Diagnosis not present

## 2015-03-19 DIAGNOSIS — I150 Renovascular hypertension: Secondary | ICD-10-CM | POA: Diagnosis not present

## 2015-04-08 DIAGNOSIS — I700 Atherosclerosis of aorta: Secondary | ICD-10-CM | POA: Diagnosis not present

## 2015-04-08 DIAGNOSIS — I150 Renovascular hypertension: Secondary | ICD-10-CM | POA: Diagnosis not present

## 2015-06-11 ENCOUNTER — Other Ambulatory Visit: Payer: Self-pay

## 2015-06-11 DIAGNOSIS — E559 Vitamin D deficiency, unspecified: Secondary | ICD-10-CM | POA: Diagnosis not present

## 2015-06-11 DIAGNOSIS — E119 Type 2 diabetes mellitus without complications: Secondary | ICD-10-CM | POA: Diagnosis not present

## 2015-06-11 DIAGNOSIS — E1165 Type 2 diabetes mellitus with hyperglycemia: Secondary | ICD-10-CM | POA: Diagnosis not present

## 2015-06-11 DIAGNOSIS — Z1231 Encounter for screening mammogram for malignant neoplasm of breast: Secondary | ICD-10-CM

## 2015-06-11 DIAGNOSIS — I2510 Atherosclerotic heart disease of native coronary artery without angina pectoris: Secondary | ICD-10-CM | POA: Diagnosis not present

## 2015-06-12 DIAGNOSIS — I6523 Occlusion and stenosis of bilateral carotid arteries: Secondary | ICD-10-CM | POA: Diagnosis not present

## 2015-06-16 DIAGNOSIS — I10 Essential (primary) hypertension: Secondary | ICD-10-CM | POA: Diagnosis not present

## 2015-06-16 DIAGNOSIS — E7800 Pure hypercholesterolemia, unspecified: Secondary | ICD-10-CM | POA: Diagnosis not present

## 2015-06-16 DIAGNOSIS — I150 Renovascular hypertension: Secondary | ICD-10-CM | POA: Diagnosis not present

## 2015-07-09 ENCOUNTER — Ambulatory Visit
Admission: RE | Admit: 2015-07-09 | Discharge: 2015-07-09 | Disposition: A | Discharge status: Home / Self Care | Payer: PPO | Source: Ambulatory Visit

## 2015-07-09 DIAGNOSIS — Z1231 Encounter for screening mammogram for malignant neoplasm of breast: Secondary | ICD-10-CM | POA: Diagnosis not present

## 2015-07-23 DIAGNOSIS — I150 Renovascular hypertension: Secondary | ICD-10-CM | POA: Diagnosis not present

## 2015-07-23 DIAGNOSIS — I10 Essential (primary) hypertension: Secondary | ICD-10-CM | POA: Diagnosis not present

## 2015-09-11 DIAGNOSIS — H35352 Cystoid macular degeneration, left eye: Secondary | ICD-10-CM | POA: Diagnosis not present

## 2015-09-11 DIAGNOSIS — H40051 Ocular hypertension, right eye: Secondary | ICD-10-CM | POA: Diagnosis not present

## 2015-09-11 DIAGNOSIS — H25013 Cortical age-related cataract, bilateral: Secondary | ICD-10-CM | POA: Diagnosis not present

## 2015-09-11 DIAGNOSIS — H40052 Ocular hypertension, left eye: Secondary | ICD-10-CM | POA: Diagnosis not present

## 2015-09-16 DIAGNOSIS — I10 Essential (primary) hypertension: Secondary | ICD-10-CM | POA: Diagnosis not present

## 2015-09-16 DIAGNOSIS — E1165 Type 2 diabetes mellitus with hyperglycemia: Secondary | ICD-10-CM | POA: Diagnosis not present

## 2015-09-16 DIAGNOSIS — E559 Vitamin D deficiency, unspecified: Secondary | ICD-10-CM | POA: Diagnosis not present

## 2015-09-16 DIAGNOSIS — E119 Type 2 diabetes mellitus without complications: Secondary | ICD-10-CM | POA: Diagnosis not present

## 2015-10-06 DIAGNOSIS — E1165 Type 2 diabetes mellitus with hyperglycemia: Secondary | ICD-10-CM | POA: Diagnosis not present

## 2015-10-13 DIAGNOSIS — I10 Essential (primary) hypertension: Secondary | ICD-10-CM | POA: Diagnosis not present

## 2015-10-13 DIAGNOSIS — E1165 Type 2 diabetes mellitus with hyperglycemia: Secondary | ICD-10-CM | POA: Diagnosis not present

## 2015-10-16 DIAGNOSIS — E103213 Type 1 diabetes mellitus with mild nonproliferative diabetic retinopathy with macular edema, bilateral: Secondary | ICD-10-CM | POA: Diagnosis not present

## 2015-10-20 DIAGNOSIS — E1165 Type 2 diabetes mellitus with hyperglycemia: Secondary | ICD-10-CM | POA: Diagnosis not present

## 2015-11-06 DIAGNOSIS — E113311 Type 2 diabetes mellitus with moderate nonproliferative diabetic retinopathy with macular edema, right eye: Secondary | ICD-10-CM | POA: Diagnosis not present

## 2015-11-06 DIAGNOSIS — E113312 Type 2 diabetes mellitus with moderate nonproliferative diabetic retinopathy with macular edema, left eye: Secondary | ICD-10-CM | POA: Diagnosis not present

## 2015-12-25 DIAGNOSIS — E113312 Type 2 diabetes mellitus with moderate nonproliferative diabetic retinopathy with macular edema, left eye: Secondary | ICD-10-CM | POA: Diagnosis not present

## 2015-12-25 DIAGNOSIS — E113311 Type 2 diabetes mellitus with moderate nonproliferative diabetic retinopathy with macular edema, right eye: Secondary | ICD-10-CM | POA: Diagnosis not present

## 2016-02-03 DIAGNOSIS — I6523 Occlusion and stenosis of bilateral carotid arteries: Secondary | ICD-10-CM | POA: Diagnosis not present

## 2016-02-05 DIAGNOSIS — E113312 Type 2 diabetes mellitus with moderate nonproliferative diabetic retinopathy with macular edema, left eye: Secondary | ICD-10-CM | POA: Diagnosis not present

## 2016-02-05 DIAGNOSIS — E113311 Type 2 diabetes mellitus with moderate nonproliferative diabetic retinopathy with macular edema, right eye: Secondary | ICD-10-CM | POA: Diagnosis not present

## 2016-02-09 DIAGNOSIS — I70209 Unspecified atherosclerosis of native arteries of extremities, unspecified extremity: Secondary | ICD-10-CM | POA: Diagnosis not present

## 2016-02-09 DIAGNOSIS — I10 Essential (primary) hypertension: Secondary | ICD-10-CM | POA: Diagnosis not present

## 2016-02-09 DIAGNOSIS — E1165 Type 2 diabetes mellitus with hyperglycemia: Secondary | ICD-10-CM | POA: Diagnosis not present

## 2016-03-07 ENCOUNTER — Encounter (HOSPITAL_COMMUNITY): Payer: Self-pay | Admitting: Emergency Medicine

## 2016-03-07 DIAGNOSIS — R7309 Other abnormal glucose: Secondary | ICD-10-CM | POA: Insufficient documentation

## 2016-03-07 DIAGNOSIS — Z5321 Procedure and treatment not carried out due to patient leaving prior to being seen by health care provider: Secondary | ICD-10-CM | POA: Insufficient documentation

## 2016-03-07 LAB — CBG MONITORING, ED: GLUCOSE-CAPILLARY: 120 mg/dL — AB (ref 65–99)

## 2016-03-07 NOTE — ED Notes (Signed)
No answer when called to go back to room.

## 2016-03-07 NOTE — ED Triage Notes (Addendum)
Pt reports not being able to find her blood glucose monitor and that she was feeling weak. Family reports that she was not eating or drinking a lot today. Pt currently alert and oriented x 4 no acute distress.

## 2016-03-08 ENCOUNTER — Emergency Department (HOSPITAL_COMMUNITY)
Admission: EM | Admit: 2016-03-08 | Discharge: 2016-03-08 | Disposition: A | Discharge status: Home / Self Care | Payer: PPO | Attending: Dermatology | Admitting: Dermatology

## 2016-03-18 DIAGNOSIS — E113312 Type 2 diabetes mellitus with moderate nonproliferative diabetic retinopathy with macular edema, left eye: Secondary | ICD-10-CM | POA: Diagnosis not present

## 2016-03-18 DIAGNOSIS — E113311 Type 2 diabetes mellitus with moderate nonproliferative diabetic retinopathy with macular edema, right eye: Secondary | ICD-10-CM | POA: Diagnosis not present

## 2016-04-15 DIAGNOSIS — H40053 Ocular hypertension, bilateral: Secondary | ICD-10-CM | POA: Diagnosis not present

## 2016-04-15 DIAGNOSIS — H25013 Cortical age-related cataract, bilateral: Secondary | ICD-10-CM | POA: Diagnosis not present

## 2016-04-15 DIAGNOSIS — E113312 Type 2 diabetes mellitus with moderate nonproliferative diabetic retinopathy with macular edema, left eye: Secondary | ICD-10-CM | POA: Diagnosis not present

## 2016-04-15 DIAGNOSIS — I10 Essential (primary) hypertension: Secondary | ICD-10-CM | POA: Diagnosis not present

## 2016-04-15 DIAGNOSIS — E782 Mixed hyperlipidemia: Secondary | ICD-10-CM | POA: Diagnosis not present

## 2016-04-15 DIAGNOSIS — H524 Presbyopia: Secondary | ICD-10-CM | POA: Diagnosis not present

## 2016-04-15 DIAGNOSIS — I739 Peripheral vascular disease, unspecified: Secondary | ICD-10-CM | POA: Diagnosis not present

## 2016-04-15 DIAGNOSIS — R0609 Other forms of dyspnea: Secondary | ICD-10-CM | POA: Diagnosis not present

## 2016-04-20 ENCOUNTER — Other Ambulatory Visit: Payer: Self-pay | Admitting: Vascular Surgery

## 2016-04-20 DIAGNOSIS — I6529 Occlusion and stenosis of unspecified carotid artery: Secondary | ICD-10-CM

## 2016-05-04 DIAGNOSIS — H2511 Age-related nuclear cataract, right eye: Secondary | ICD-10-CM | POA: Diagnosis not present

## 2016-05-04 DIAGNOSIS — H25011 Cortical age-related cataract, right eye: Secondary | ICD-10-CM | POA: Diagnosis not present

## 2016-05-04 DIAGNOSIS — H25811 Combined forms of age-related cataract, right eye: Secondary | ICD-10-CM | POA: Diagnosis not present

## 2016-05-13 DIAGNOSIS — R0602 Shortness of breath: Secondary | ICD-10-CM | POA: Diagnosis not present

## 2016-05-14 ENCOUNTER — Encounter: Payer: Self-pay | Admitting: Vascular Surgery

## 2016-05-14 DIAGNOSIS — E782 Mixed hyperlipidemia: Secondary | ICD-10-CM | POA: Diagnosis not present

## 2016-05-14 DIAGNOSIS — I739 Peripheral vascular disease, unspecified: Secondary | ICD-10-CM | POA: Diagnosis not present

## 2016-05-14 DIAGNOSIS — R0602 Shortness of breath: Secondary | ICD-10-CM | POA: Diagnosis not present

## 2016-05-14 DIAGNOSIS — E119 Type 2 diabetes mellitus without complications: Secondary | ICD-10-CM | POA: Diagnosis not present

## 2016-05-27 ENCOUNTER — Other Ambulatory Visit: Payer: Self-pay

## 2016-05-27 ENCOUNTER — Ambulatory Visit (INDEPENDENT_AMBULATORY_CARE_PROVIDER_SITE_OTHER): Payer: PPO | Admitting: Vascular Surgery

## 2016-05-27 ENCOUNTER — Encounter: Payer: Self-pay | Admitting: Vascular Surgery

## 2016-05-27 ENCOUNTER — Ambulatory Visit (HOSPITAL_COMMUNITY)
Admission: RE | Admit: 2016-05-27 | Discharge: 2016-05-27 | Disposition: A | Discharge status: Home / Self Care | Payer: PPO | Source: Ambulatory Visit | Attending: Vascular Surgery | Admitting: Vascular Surgery

## 2016-05-27 VITALS — BP 130/80 | HR 65 | Temp 97.5°F | Resp 20 | Ht 65.5 in | Wt 177.0 lb

## 2016-05-27 DIAGNOSIS — I6523 Occlusion and stenosis of bilateral carotid arteries: Secondary | ICD-10-CM | POA: Insufficient documentation

## 2016-05-27 DIAGNOSIS — I6529 Occlusion and stenosis of unspecified carotid artery: Secondary | ICD-10-CM

## 2016-05-27 DIAGNOSIS — I739 Peripheral vascular disease, unspecified: Secondary | ICD-10-CM | POA: Diagnosis not present

## 2016-05-27 LAB — VAS US CAROTID
LCCADDIAS: 25 cm/s
LCCADSYS: 80 cm/s
LEFT ECA DIAS: -44 cm/s
LICADDIAS: -23 cm/s
LICAPSYS: -360 cm/s
Left CCA prox dias: 11 cm/s
Left CCA prox sys: 46 cm/s
Left ICA dist sys: -118 cm/s
Left ICA prox dias: -72 cm/s
RCCAPSYS: 85 cm/s
RIGHT CCA MID DIAS: 20 cm/s
RIGHT ECA DIAS: -47 cm/s
Right CCA prox dias: 13 cm/s
Right cca dist sys: -86 cm/s

## 2016-05-27 NOTE — Progress Notes (Signed)
Referring Physician: Clayton Lefort, MD  Patient name: Yvonne Patel MRN: [id number] DOB: Nov 03, 1945 Sex: female  REASON FOR CONSULT: carotid stenosis  HPI: Yvonne Patel is a 71 y.o. female referred by Dr. Nadyne Coombes for evaluation of left internal carotid artery stenosis. 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 She is currently asymptomatic. She denies any symptoms of TIA amaurosis or stroke within the last year. The carotid stenosis was picked up on surveillance. The patient does has a history of peripheral arterial disease and has previously had lower extremity stenting and renal artery stenting by Dr. Nadyne Coombes. She did smoke in the past but quit about 30 years ago. She currently is able to walk 1-2 blocks without experiencing lower extremity symptoms. She is on aspirin Plavix and a statin.  Past Medical History:  Diagnosis Date  . Hyperlipidemia   . Hypertension   . Neuromuscular disorder (HCC)    neuropathy  . PAD (peripheral artery disease) ([place])   . Peripheral vascular disease ([place])   . Refusal of blood transfusions as patient is Jehovah's Witness   . Retinal disease   . Syncope   . TIA (transient ischemic attack) 2010; 09/2011   denies residual on 03/11/2015  . Type II diabetes mellitus ([place])    Past Surgical History:  Procedure Laterality Date  . DILATION AND CURETTAGE OF UTERUS    . FEMORAL ARTERY STENT    . LOWER EXTREMITY ANGIOGRAM N/A 06/05/2012   Procedure: LOWER EXTREMITY ANGIOGRAM;  Surgeon: [name], MD;  Location: [facility] CATH LAB;  Service: Cardiovascular;  Laterality: N/A;  . LOWER EXTREMITY ANGIOGRAM N/A 07/11/2012   Procedure: LOWER EXTREMITY ANGIOGRAM;  Surgeon: [name], MD;  Location: [facility] CATH LAB;  Service: Cardiovascular;  Laterality: N/A;  . LOWER EXTREMITY ANGIOGRAM N/A 09/19/2012   Procedure: LOWER EXTREMITY ANGIOGRAM;  Surgeon: [name], MD;  Location[facility] CATH LAB;  Service: Cardiovascular;  Laterality: N/A;  .  PERIPHERAL VASCULAR CATHETERIZATION N/A 03/11/2015   Procedure: Renal Angiography;  Surgeon: [name], MD;  Location: [facility] CV LAB;  Service: Cardiovascular;  Laterality: N/A;  . RENAL ANGIOGRAM Left 06/05/2012   Procedure: RENAL ANGIOGRAM;  Surgeon: [name], MD;  Location: [facility] CATH LAB;  Service: Cardiovascular;  Laterality: Left;  . RENAL ARTERY ANGIOPLASTY Right 03/11/2015  . TUBAL LIGATION    . VAGINAL HYSTERECTOMY      No family history on file.  SOCIAL HISTORY: Social History   Social History  . Marital status: Divorced    Spouse name: N/A  . Number of children: N/A  . Years of education: N/A   Occupational History  . Not on file.   Social History Main Topics  . Smoking status: Former Smoker    Packs/day: 0.50    Years: 10.00    Types: Cigarettes    Start date: 03/28/1965    Quit date: 03/31/1975  . Smokeless tobacco: Never Used  . Alcohol use Yes     Comment: 03/11/2015 'drink at gatherings a couple times/year"  . Drug use: No  . Sexual activity: No   Other Topics Concern  . Not on file   Social History Narrative  . No narrative on file    No Known Allergies  Current Outpatient Prescriptions  Medication Sig Dispense Refill  . amLODipine (NORVASC) 10 MG tablet Take 10 mg by mouth daily.    Marland Kitchen aspirin EC 81 MG tablet Take 81 mg by mouth daily.    Marland Kitchen  atorvastatin (LIPITOR) 40 MG tablet Take 40 mg by mouth daily.    . clopidogrel (PLAVIX) 75 MG tablet Take 75 mg by mouth daily.    . insulin detemir (LEVEMIR) 100 UNIT/ML injection Inject 34 Units into the skin at bedtime. Add 2 units if over 150    . labetalol (NORMODYNE) 200 MG tablet Take 200 mg by mouth 2 (two) times daily.    Marland Kitchen losartan-hydrochlorothiazide (HYZAAR) 100-25 MG tablet Take 1 tablet by mouth daily.    . metFORMIN (GLUCOPHAGE) 500 MG tablet Take 1,000 mg by mouth 2 (two) times daily.    . timolol (TIMOPTIC-XR) 0.5 % ophthalmic gel-forming Place 1 drop into both eyes daily.     No current  facility-administered medications for this visit.     ROS:   General:  No weight loss, Fever, chills  HEENT: No recent headaches, no nasal bleeding, no visual changes, no sore throat  Neurologic: No dizziness, blackouts, seizures. No recent symptoms of stroke or mini- stroke. No recent episodes of slurred speech, or temporary blindness.  Cardiac: No recent episodes of chest pain/pressure, no shortness of breath at rest.  No shortness of breath with exertion.  Denies history of atrial fibrillation or irregular heartbeat  Vascular: No history of rest pain in feet.  + history of claudication.  No history of non-healing ulcer, No history of DVT   Pulmonary: No home oxygen, no productive cough, no hemoptysis,  No asthma or wheezing  Musculoskeletal:  [ ]  Arthritis, [ ]  Low back pain,  [ ]  Joint pain  Hematologic:No history of hypercoagulable state.  No history of easy bleeding.  No history of anemia  Gastrointestinal: No hematochezia or melena,  No gastroesophageal reflux, no trouble swallowing  Urinary: [ ]  chronic Kidney disease, [ ]  on HD - [ ]  MWF or [ ]  TTHS, [ ]  Burning with urination, [ ]  Frequent urination, [ ]  Difficulty urinating;   Skin: No rashes  Psychological: No history of anxiety,  No history of depression   Physical Examination  Vitals:   05/27/16 1011  BP: 108/80  Resp: 20  Temp: 97 F (36.1 C)  TempSrc: Oral  Weight: 177 lb (80.3 kg)  Height: 5' 5.5" (1.664 m)   Blood pressure in the right arm is 008 systolic, left arm 676 systolic Body mass index is 29.01 kg/m.  General:  Alert and oriented, no acute distress HEENT: Normal Neck: Soft left carotid bruit Pulmonary: Clear to auscultation bilaterally Cardiac: Regular Rate and Rhythm without murmur Abdomen: Soft, non-tender, non-distended, no mass Skin: No rash Extremity Pulses:  2+ radial, brachial, femoral pulses bilaterally Musculoskeletal: No deformity or edema  Neurologic: Upper and lower  extremity motor 5/5 and symmetric  DATA:  Carotid duplex exam performed in our office today showed on the left side 60-80% stenosis but this was a difficult study due to calcific plaque and tortuosity. Right side was 40-60%. The patient had bilateral external carotid artery high-grade stenosis. She also had findings suggestive of right subclavian stenosis.  ASSESSMENT: #1 chronic medical problems including elevated cholesterol hypertension and diabetes are all currently stable.  #2 left internal carotid artery stenosis our duplex exam today did not show is high velocity but the previous duplex exam at Dr. Mila Palmer office showed a very high velocity consistent with high-grade carotid stenosis. I believe the patient will need a carotid angiogram to define which of these studies is more accurate. If the patient truly has greater than 80% stenosis on her carotid angiogram  we will proceed with carotid endarterectomy in the near future. Risks benefits possible complications and procedure details a carotid angiogram were explained the patient and her daughter today. These include but are not limited to bleeding infection stroke risk of 1 100,000. She wishes to proceed.  #3 peripheral arterial disease bilateral common femoral and superficial femoral artery occlusive disease by recent duplex exam in Dr. Mila Palmer office. Her overall symptoms have been stable and are followed by Dr. Nadyne Coombes.  #4 Renal vascular hypertension she did have some evidence of recurrent stenosis is also followed by Dr. Nadyne Coombes.  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 Currently asymptomatic. This will be further evaluated during her carotid angiogram.   PLAN:  Arch and carotid angiogram scheduled for 06/11/2016.  [name], MD [facility] Office: [phone] Pager: [phone]

## 2016-06-01 DIAGNOSIS — R0609 Other forms of dyspnea: Secondary | ICD-10-CM | POA: Diagnosis not present

## 2016-06-01 DIAGNOSIS — E782 Mixed hyperlipidemia: Secondary | ICD-10-CM | POA: Diagnosis not present

## 2016-06-01 DIAGNOSIS — E1151 Type 2 diabetes mellitus with diabetic peripheral angiopathy without gangrene: Secondary | ICD-10-CM | POA: Diagnosis not present

## 2016-06-01 DIAGNOSIS — I10 Essential (primary) hypertension: Secondary | ICD-10-CM | POA: Diagnosis not present

## 2016-06-11 ENCOUNTER — Ambulatory Visit (HOSPITAL_COMMUNITY): Admission: RE | Admit: 2016-06-11 | Payer: PPO | Source: Ambulatory Visit | Admitting: Vascular Surgery

## 2016-06-11 ENCOUNTER — Encounter (HOSPITAL_COMMUNITY): Admission: RE | Payer: Self-pay | Source: Ambulatory Visit

## 2016-06-11 SURGERY — CAROTID ANGIOGRAPHY
Anesthesia: LOCAL | Laterality: Bilateral

## 2016-06-15 DIAGNOSIS — H25012 Cortical age-related cataract, left eye: Secondary | ICD-10-CM | POA: Diagnosis not present

## 2016-06-15 DIAGNOSIS — H25812 Combined forms of age-related cataract, left eye: Secondary | ICD-10-CM | POA: Diagnosis not present

## 2016-06-15 DIAGNOSIS — H2512 Age-related nuclear cataract, left eye: Secondary | ICD-10-CM | POA: Diagnosis not present

## 2016-06-16 DIAGNOSIS — R0609 Other forms of dyspnea: Secondary | ICD-10-CM | POA: Diagnosis not present

## 2016-06-21 DIAGNOSIS — R9439 Abnormal result of other cardiovascular function study: Secondary | ICD-10-CM

## 2016-06-21 NOTE — H&P (Signed)
OFFICE VISIT NOTES COPIED TO EPIC FOR DOCUMENTATION  . History of Present Illness Yvonne Page MD; 06/01/2016 6:47 PM) Patient words: f/u for shortness of breath, echo and nuc test results; last office visit 04/15/2016.  The patient is a 71 year old female who presents for a follow-up for Follow-up for Peripheral vascular disease.  Additional reasons for visit:  Follow-up for Shortness of breath is described as the following: [name] with uncontrolled diabetes, has renovascular hypertension and renal atherosclerosis with bilateral renal artery stenting in the past, severe peripheral arterial disease. She has bilateral iliac artery stenting and also SFA stenting. She has history of hypertension, hyperlipidemia and diabetes mellitus. She has asymptomatic bilateral carotid artery stenosis.  She denies any further claudication symptoms and states that "her legs feel fine". No recent weight changes, states that her diabetes is much improved. She wanted to be seen urgently due to worsening dyspnea and I had seen her about 4 weeks ago, and also referred her to see Dr. [name] due to severe right ICA stenosis. She underwent nuclear stress testing and echocardiogram to evaluate dyspnea and presents here for follow-up. She is accompanied by her daughter at the bedside. She has been doing well except minimal exertion activity causes her to have dyspnea. Denies any PND or orthopnea. No leg edema.  She was evaluated by Dr. Oneida Alar and was recommended cerebral angiography extracranial to evaluate significance of carotid stenosis.   Problem List/Past Medical Franky Macho Reader; 06/01/2016 3:20 PM) Peripheral artery disease (I73.9)  PV angio 06/06/12: Left renal stenting. Peripheral arteriogram 07/11/2012: PTA and successful angioplasty of the right SFA following atherectomy and implantation of a 6.0 x 150 mm self-expanding stent. One vessel below the knee in the form of peroneal artery.  Excellent results. 09/19/2012: PTA and chocolate balloon angioplasty of the left distal common femoral and proximal and mid SFA . H/O PTA and stenting of the bilateral iliac arteries in 2010. Single vessel r/o (peroneal arteries bilateral). Benign essential hypertension (I10)  Cerebral arteriosclerosis with history of previous cerebrovascular accident (I67.2)  09/29/2011 MRI 5 mm right periophthalmic aneurysm. 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 Moderate to marked tandem stenosis of the cavernous segment of the internal carotid artery greater on the left. Mild irregularity and narrowing supraclinoid segment of the internal carotid artery bilaterally. No significant change with repeat scan June 1st 2014 when she presented with syncope in [place]. Carotid duplex 09/18/2012: Right carotid 50-69% and left Carotid >70% stenosis. No significnat change since 03/30/12. Recheck 6 months. Hyperlipidemia, group A (E78.00)  Carotid artery stenosis, bilateral  Carotid artery duplex 02/03/2016: Doppler flow velocities in the bilateral external carotid artery (ECA) are consistent with stenosis in the range of near occlusion with severe heterogeneous plaque. Doppler flow velocities in the right internal carotid artery (ICA) are consistent with stenosis in the range of 50-69% with >= 50% heterogeneous plaque. Doppler flow velocities in the left distal internal carotid artery (ICA-dis) are consistent with stenosis in the range of near occlusion with marked lumen narrowing, heterogeneous plaque. Mild stenosis in the bilateral common carotid artery (<50%) with heteregenous plaque throughout. Right vertebral artery is stenosed with antegrade flow. Antegrade left vertebral artery flow. Compared to the study done on 04/60/2017, the left internal carotid artery velocity has increased to near occlusion, measuring 718/98 cm/s with ICA/CCA ratio of 9.54. Consider further workup including  catheter directed angiography to confirm stenosis severity due to severe calcific disease. 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 Left renal stent placed 06/06/12 (5.0x57m Express and 3.5x12 Veriflex distally). diffuse ISR without PG on pullback hence left alone. Diabetes type 2, uncontrolled (E11.65)  Diabetes mellitus with peripheral artery disease (E11.51)  Labwork  Labs 05/14/2016: Total cholesterol 232, triglycerides 90, HDL 57, LDL 157 Labs 09/16/2015: BUN 21, serum creatinine 1.31, eGFR 46 mL. CMP otherwise normal. A1c 7.2%, TSH normal. Vitamin D 12.2. Labs 04/60/2017: HB 10.7/HCT 31.5, normal indicis. BUN 19, serum creatinine 1.21, eGFR 52 mL. K 4.5. Total cholesterol 211, triglycerides 63, HDL 60, LDL 138. HbA1c 7.8%. TSH normal. 02/27/2015: serum glucose 137 (nonfasting), creatinine 1.22, eGFR 52, potassium 4.3, CBC normal, PT/INR normal 07/08/2014: HbA1c 7.7%, serum glucose 185, creatinine 1.24, eGFR 43, potassium 4.3, CMP normal. 07/08/2014: HbA1c 7.7%, serum glucose 185, creatinine 1.24, eGFR 43, potassium 4.3, CMP normal. Labs 02/15/2014: HbA1c 10.2%. 08/31/2013: HbA1c 10.6%. 07/08/2014: HbA1c 7.7%, serum glucose 185, creatinine 1.24, eGFR 43, potassium 4.3, CMP normal Labs 02/15/2014: Potassium 3.9, BUN 20, creatinine 1.15, CMP otherwise normal. Labs 08/31/2013: BUN 22, potassium 3.7, creatinine 1.42, eGFR 44 mL. Labs 02/15/2014: total cholesterol 194, triglycerides 72, HDL 44, LDL 99. Lipid profile 07/27/2013: Total cholesterol 230, triglycerides 119, HDL 32, LDL 164. CMP was normal. Dyspnea on exertion (R06.09)  Echocardiogram 05/13/2016: Left ventricle cavity is normal in size. Severe concentric hypertrophy of the left ventricle with speckled pattern suggesting infiltrative cardiomyopathy. Mild to moderate decrease in global wall motion and systolic function with diffuse  hypokinesis. Doppler evidence of grade II (pseudonormal) diastolic dysfunction, elevated LAP. Diastolic dysfunction findings suggests elevated LA/LV end diastolic pressure. Visual EF 35-40% Calculated EF 42%. Left atrial cavity is severely dilated by volume. Interatrial septum bulges to the right suggests elevated left atrial pressure. Mild aortic valve calcification. Moderate calcification of the mitral valve annulus. Moderate mitral valve leaflet calcification. Mildly restricted mitral valve leaflets. NO mitral stenosis. Mild mitral regurgitation. Mild tricuspid regurgitation. No evidence of pulmonary hypertension. Part of the study done on 01/19/2013, LVEF was previously normal  Allergies (Franky MachoReader; 32018-04-103:20 PM) No Known Drug Allergies 01/27/2012  Family History (Franky MachoReader; 304/10/20183:20 PM) Mother  Deceased. at age 71-hadDiabetes Father  Deceased. at age 5029from natural causes. Siblings  5 (1 deceased)-Sister has heart problems?  Social History (Franky MachoReader; 304-10-183:20 PM) Current tobacco use  Former smoker. quit in 1970 Non Drinker/No Alcohol Use  Marital status  Divorced. Living Situation  Lives alone. Number of Children  5.  Past Surgical History (Franky MachoReader; 3April 10, 20183:20 PM) PVangioplasty/right groin stent in 2010 and 2011  Hysterectomy (not due to cancer) - Partial 1984  Medication History (Yvonne Page MD; 32018-04-104:34 PM) AmLODIPine Besylate (10MG Tablet, 1 Tablet Tablet Tablet Oral daily, Taken starting 05/14/2016) Active. Atorvastatin Calcium (40MG Tablet, 1 Tablet Tablet Tablet Oral at bedtime, Taken starting 06/16/2015) Active. (Decreased from 80 mg by PCP) Losartan Potassium-HCTZ (100-25MG Tablet, 1 (one) Tablet Tablet Tablet T Oral every morning, Taken starting 05/26/2012) Active. Labetalol HCl (200MG Tablet, 1 Oral 3 times a day, Taken starting 04/15/2016) Active. (Increase from BID to TID) Plavix (75MG  Tablet, 1 Oral daily) Active. Aspirin (81MG Tablet, 1 Oral daily) Active. MetFORMIN HCl (500MG Tablet, 2 Oral two times daily) Active. (Pt could not swallow 10019mpills, so she is taking 50073m two times daily. Starting 12--09-2014) Timolol Maleate (0.5% Solution, 1 drop ea eye Ophthalmic daily) Active. TreTyler AasexTouch (  100UNIT/ML Soln Pen-inj, 30 Units Subcutaneous daily) Active. Vitamin D (Ergocalciferol) (50000UNIT Capsule, 1 Oral two times a week) Active: Hypovitaminosis D. Medications Reconciled (Verbally with patient, list present)  Diagnostic Studies History Franky Macho Reader; 06/01/2016 8:15 AM) Echocardiogram 05/13/2016 Left ventricle cavity is normal in size. Severe concentric hypertrophy of the left ventricle with speckled pattern suggesting infiltrative cardiomyopathy. Mild to moderate decrease in global wall motion and systolic function with diffuse hypokinesis. Doppler evidence of grade II (pseudonormal) diastolic dysfunction, elevated LAP. Diastolic dysfunction findings suggests elevated LA/LV end diastolic pressure. Visual EF 35-40% Calculated EF 42%. Left atrial cavity is severely dilated by volume. Interatrial septum bulges to the right suggests elevated left atrial pressure. Mild aortic valve calcification. Moderate calcification of the mitral valve annulus. Moderate mitral valve leaflet calcification. Mildly restricted mitral valve leaflets. NO mitral stenosis. Mild mitral regurgitation. Mild tricuspid regurgitation. No evidence of pulmonary hypertension. Part of the study done on 01/19/2013, LVEF was previously normal Nuclear stress test 05/14/2016 1. Normal sinus rhythm at rate of 71 bpm, right atrial abnormality, LVH with repolarization abnormality, cannot exclude inferior and lateral ischemia. Stress EKG is non-diagnostic for ischemia as it a pharmacologic stress using Lexiscan. Stress symptoms included dyspnea, nausea, dizziness, chest pressure. 2. Left ventricular cavity  is noted to be enlarged on the rest and stress studies. Left ventricular end-diastolic wall 951 mL. REST and STRESS images demonstrate homogeneous uptake of the tracer throughout the myocardium of the left ventricle. Left ventricle systolic function calculated by QGS was severely depressed at 29% with global hypokinesis. The findings may represent nonischemic cardiomyopathy, clinical correlation recommended. Intermediate risk study.  Other Problems Franky Macho Reader; 06/01/2016 3:20 PM) H/o 2 TIA's in 2011 and 7/13.  Was admitted to [facility] for Dyhydration 08/25/2012 Echocardiogram findings abnormal, without diagnosis (R93.1) 10/16/2012 Echo- 10/16/12 1.Left ventricular internal dimension is decreased. Severe concentric hypertrophy with near cavity obliteration & outflow obstruction ( No gradient seen). Normal global wall motion. Normal systolic global function. Calculated EF 59%. Grade 1 diastolic function ( abnormal relaxation). 2.Moderate aortic valve leaflet calcification. 3.Moderate calcification of the mitral annulus. Mitral valve inflow E > A ratio. Mild mitral valve leaflet calcification. 4.Tricuspid valve structurally normal. Trace tricuspid regurgitation. H/O Doppler ultrasound (Z92.89) 11/13/2012 Lower extremity arterial duplex 11/13/2012: 1. There is greater than 50% stenosis of the right and left profunda femoral artery. 2. Monophasic waveform throughout the right SFA and below the knee suggest right In-Flow disease, possibly iliac artery stenosis. There is diffuse plaque evident throughout the right lower extremity. 3. Biphasic waveforms throughout the left superficial femoral artery and below the knee suggest diffuse disease. 4. Mild decrease in bilateral ABI with 0.85 on the right and 0.96 and left. Compared to the study done on 03/27/2012 right In-Flow disease in the iliac artery is new. Otherwise no significant change. Bilateral common femoral artery velocities have  decreased from greater than 50% stenosis less than 50% stenosis. ABI is improved on the right from 0.63 and and left from 0.66. The SFA stents appear to be patent, however the right iliac velocity decrease is new. Clinical correlation recommended. Recheck 6 months.    Review of Systems Yvonne Page MD; 06/01/2016 6:48 PM) General Present- Fatigue (since renal angiplasty and attributes this to BP medications) and Weight Loss (intentional weight loss). Not Present- Fever and Weight Gain. HEENT Not Present- Headache. Respiratory Not Present- Bloody sputum and Hemoptysis. Cardiovascular Present- Claudications (mild calf and stable) and Difficulty Breathing On Exertion. Not Present- Edema,  Fainting and Paroxysmal Nocturnal Dyspnea. Gastrointestinal Not Present- Black, Tarry Stool, Bloody Stool and Difficulty Swallowing. Musculoskeletal Not Present- Joint Pain, Physical Disability and Swelling of Extremities. Neurological Not Present- Dizziness and Focal Neurological Symptoms. Psychiatric Not Present- Anxiety, Depression, Suicidal Ideation and Suicidal Planning. Endocrine Not Present- Cold Intolerance, Heat Intolerance, Polydipsia and Polyuria. Hematology Not Present- Abnormal Bleeding and Easy Bleeding. All other systems negative  Vitals (Charavina Reader; 06/01/2016 3:40 PM) 06/01/2016 3:30 PM Weight: 179.31 lb Height: 65in Body Surface Area: 1.89 m Body Mass Index: 29.84 kg/m  Pulse: 72 (Regular)  P.OX: 98% (Room air) BP: 130/78 (Sitting, Left Arm, Standard)       Physical Exam Yvonne Page MD; 06/01/2016 6:48 PM) General Mental Status-Alert. General Appearance-Cooperative, Appears stated age, Not in acute distress. Orientation-Oriented X3. Build & Nutrition-Well nourished and Moderately built.  Head and Neck Thyroid Gland Characteristics - no palpable nodules, no palpable enlargement.  Chest and Lung Exam Chest and lung exam reveals -quiet, even  and easy respiratory effort with no use of accessory muscles and on auscultation, normal breath sounds, no adventitious sounds.  Cardiovascular Inspection Jugular vein - Right - No Distention. Auscultation Heart Sounds - S1 WNL, S2 WNL and No gallop present. Murmurs & Other Heart Sounds: Murmur - Location - Aortic Area. Grade - II/VI. Character - Systolic Ejection.  Abdomen Palpation/Percussion Normal exam - Non Tender and No hepatosplenomegaly. Auscultation Normal exam - Bowel sounds normal.  Peripheral Vascular Lower Extremity Inspection - Left - No Pigmentation, No Varicose veins. Right - No Pigmentation, No Varicose veins. Palpation - Edema - Bilateral - No edema. Femoral pulse - Bilateral - 1+(PROMINENT BRUIT). Popliteal pulse - Bilateral - Feeble. Dorsalis pedis pulse - Bilateral - 0+(NORMAL CAPILLARY FILL). Posterior tibial pulse - Bilateral - 0+. Carotid arteries - Bilateral-Harsh Bruit. Abdomen-Epigastric bruit present, No prominent abdominal aortic pulsation.  Neurologic Motor-Grossly intact without any focal deficits.  Musculoskeletal Global Assessment Left Lower Extremity - normal range of motion without pain. Right Lower Extremity - normal range of motion without pain.    Assessment & Plan Yvonne Page MD; 06/01/2016 6:51 PM) Dyspnea on exertion (R06.09) Story: Echocardiogram 05/13/2016: Left ventricle cavity is normal in size. Severe concentric hypertrophy of the left ventricle with speckled pattern suggesting infiltrative cardiomyopathy. Mild to moderate decrease in global wall motion and systolic function with diffuse hypokinesis. Doppler evidence of grade II (pseudonormal) diastolic dysfunction, elevated LAP. Diastolic dysfunction findings suggests elevated LA/LV end diastolic pressure. Visual EF 35-40% Calculated EF 42%. Left atrial cavity is severely dilated by volume. Interatrial septum bulges to the right suggests elevated left atrial  pressure. Mild aortic valve calcification. Moderate calcification of the mitral valve annulus. Moderate mitral valve leaflet calcification. Mildly restricted mitral valve leaflets. NO mitral stenosis. Mild mitral regurgitation. Mild tricuspid regurgitation. No evidence of pulmonary hypertension. Lexiscan myoview stress test 05/14/2016: 1. Normal sinus rhythm at rate of 71 bpm, right atrial abnormality, LVH with repolarization abnormality, cannot exclude inferior and lateral ischemia. Stress EKG is non-diagnostic for ischemia as it a pharmacologic stress using Lexiscan. Stress symptoms included dyspnea, nausea, dizziness, chest pressure. 2. Left ventricular cavity is noted to be enlarged on the rest and stress studies. Left ventricular end-diastolic wall 902 mL. REST and STRESS images demonstrate homogeneous uptake of the tracer throughout the myocardium of the left ventricle. Left ventricle systolic function calculated by QGS was severely depressed at 29% with global hypokinesis. The findings may represent nonischemic cardiomyopathy, clinical correlation recommended. Intermediate risk study. Future Plans 06/14/2016:  METABOLIC PANEL, BASIC (76226) - one time 06/14/2016: CBC & PLATELETS (AUTO) (33354) - one time 06/14/2016: PT (PROTHROMBIN TIME) (56256) - one time Benign essential hypertension (I10) Impression: EKG 04/15/2016: Normal sinus rhythm at rate of 71 bpm, right atrial abnormality, LVH with repolarization abnormality, cannot exclude inferior and lateral ischemia. Consider hypertrophic cardiomyopathy. No significant change from EKG 112-06-2014. Labwork Story: 06/17/2016: Glucose 107, creatinine 1.38, EGFR 44, potassium 4.4, BMP otherwise normal.  CBC normal.  INR 1.0 PT 10.3. Labs 05/14/2016: Total cholesterol 232, triglycerides 90, HDL 57, LDL 157  Labs 09/16/2015: BUN 21, serum creatinine 1.31, eGFR 46 mL. CMP otherwise normal. A1c 7.2%, TSH normal. Vitamin D 12.2.  Labs 06/22/2015: HB 10.7/HCT 31.5,  normal indicis. BUN 19, serum creatinine 1.21, eGFR 52 mL. K 4.5. Total cholesterol 211, triglycerides 63, HDL 60, LDL 138. HbA1c 7.8%. TSH normal.  02/27/2015: serum glucose 137 (nonfasting), creatinine 1.22, eGFR 52, potassium 4.3, CBC normal, PT/INR normal.  Lipid profile 07/27/2013: Total cholesterol 230, triglycerides 119, HDL 32, LDL 164. CMP was normal. Carotid artery stenosis, bilateral Story: Carotid artery duplex 02/03/2016: Doppler flow velocities in the bilateral external carotid artery (ECA) are consistent with stenosis in the range of near occlusion with severe heterogeneous plaque. Doppler flow velocities in the right internal carotid artery (ICA) are consistent with stenosis in the range of 50-69% with >= 50% heterogeneous plaque. Doppler flow velocities in the left distal internal carotid artery (ICA-dis) are consistent with stenosis in the range of near occlusion with marked lumen narrowing, heterogeneous plaque. Mild stenosis in the bilateral common carotid artery (<50%) with heteregenous plaque throughout. Right vertebral artery is stenosed with antegrade flow. Antegrade left vertebral artery flow. Compared to the study done on 04/60/2017, the left internal carotid artery velocity has increased to near occlusion, measuring 718/98 cm/s with ICA/CCA ratio of 9.54. Consider further workup including catheter directed angiography to confirm stenosis severity due to severe calcific disease. Impression: OV Note-Dr [name] 05/27/2016: Carotid angiogram scheduled for 06/21/16 Diabetes mellitus with peripheral artery disease (E11.51) Mixed hyperlipidemia (E78.2) Current Plans Changed Atorvastatin Calcium '80MG'$ , 1 (one) Tablet daily, #30, 06/01/2016, Ref. x3. Started Ezetimibe '10MG'$ , 1 (one) Tablet every evening after dinner, #30, 06/01/2016, Ref. x2. 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 Left renal stent placed 06/06/12 (5.0x68m Express and 3.5x12 Veriflex distally). diffuse ISR without PG on pullback hence left alone. Impression: Renal artery duplex 01/27/2015: There is an abnormal monophasic waveform throughout the left renal artery associated with decreased left kidney size to 9 cm from 10 cm. Persistent borderline elevated peak systolic velocity in the right renal artery, potentially indicated to of hemodynamically significant residual stenosis. Consider further workup Current Plans Mechanism of underlying disease process and action of medications discussed with the patient. I discussed primary/secondary prevention and also dietary counceling was done. She is clearly noticed symptoms of what appeared to be anginal equivalent with dyspnea on exertion with climbing 1 flight of stair. Her risk factors are relatively well controlled, blood pressures also improved since being on higher dose of labetalol. I'm very concerned about decreased LVEF by both nuclear stress test and echocardiogram, in a patient with multiple cardiac risk factors, multivessel disease or left main disease cannot be excluded. Hence I have recommended that he proceed with left heart catheterization. I will discuss with Dr. FOneida Alarif I may proceed with extracranial carotid angiography in the same setting keeping contest in mind as she  will need cardiac catheterization prior to any planned carotid intervention including endarterectomy if needed.  Lipids were discussed, I have increased her Lipitor from 40 mg to 80 mg. I have added Zetia 10 mg daily. I will like to see her back after the angiography and I'll make further recommendation. All questions answered, extremely complex patient.  CC: Jeanann Lewandowsky, MD; Dr. [name].    Signed by Yvonne Page, MD (06/01/2016 6:52 PM)

## 2016-06-22 ENCOUNTER — Encounter (HOSPITAL_COMMUNITY)
Admission: RE | Disposition: A | Discharge status: Home / Self Care | Payer: Self-pay | Source: Ambulatory Visit | Attending: Cardiology

## 2016-06-22 ENCOUNTER — Encounter (HOSPITAL_COMMUNITY): Payer: Self-pay | Admitting: Cardiology

## 2016-06-22 ENCOUNTER — Ambulatory Visit (HOSPITAL_COMMUNITY)
Admission: RE | Admit: 2016-06-22 | Discharge: 2016-06-22 | Disposition: A | Discharge status: Home / Self Care | Payer: PPO | Source: Ambulatory Visit | Attending: Cardiology | Admitting: Cardiology

## 2016-06-22 DIAGNOSIS — I2510 Atherosclerotic heart disease of native coronary artery without angina pectoris: Secondary | ICD-10-CM | POA: Insufficient documentation

## 2016-06-22 DIAGNOSIS — I342 Nonrheumatic mitral (valve) stenosis: Secondary | ICD-10-CM | POA: Insufficient documentation

## 2016-06-22 DIAGNOSIS — I150 Renovascular hypertension: Secondary | ICD-10-CM | POA: Insufficient documentation

## 2016-06-22 DIAGNOSIS — I708 Atherosclerosis of other arteries: Secondary | ICD-10-CM | POA: Insufficient documentation

## 2016-06-22 DIAGNOSIS — E1151 Type 2 diabetes mellitus with diabetic peripheral angiopathy without gangrene: Secondary | ICD-10-CM | POA: Diagnosis not present

## 2016-06-22 DIAGNOSIS — I6501 Occlusion and stenosis of right vertebral artery: Secondary | ICD-10-CM | POA: Insufficient documentation

## 2016-06-22 DIAGNOSIS — Z7984 Long term (current) use of oral hypoglycemic drugs: Secondary | ICD-10-CM | POA: Insufficient documentation

## 2016-06-22 DIAGNOSIS — I2582 Chronic total occlusion of coronary artery: Secondary | ICD-10-CM | POA: Diagnosis not present

## 2016-06-22 DIAGNOSIS — R9439 Abnormal result of other cardiovascular function study: Secondary | ICD-10-CM

## 2016-06-22 DIAGNOSIS — Z7902 Long term (current) use of antithrombotics/antiplatelets: Secondary | ICD-10-CM | POA: Diagnosis not present

## 2016-06-22 DIAGNOSIS — E782 Mixed hyperlipidemia: Secondary | ICD-10-CM | POA: Diagnosis not present

## 2016-06-22 DIAGNOSIS — I6523 Occlusion and stenosis of bilateral carotid arteries: Secondary | ICD-10-CM | POA: Insufficient documentation

## 2016-06-22 DIAGNOSIS — Z8673 Personal history of transient ischemic attack (TIA), and cerebral infarction without residual deficits: Secondary | ICD-10-CM | POA: Insufficient documentation

## 2016-06-22 DIAGNOSIS — Z7982 Long term (current) use of aspirin: Secondary | ICD-10-CM | POA: Insufficient documentation

## 2016-06-22 DIAGNOSIS — Z87891 Personal history of nicotine dependence: Secondary | ICD-10-CM | POA: Insufficient documentation

## 2016-06-22 HISTORY — PX: RENAL ANGIOGRAPHY: CATH118260

## 2016-06-22 HISTORY — PX: AORTIC ARCH ANGIOGRAPHY: CATH118224

## 2016-06-22 HISTORY — PX: LEFT HEART CATH AND CORONARY ANGIOGRAPHY: CATH118249

## 2016-06-22 HISTORY — PX: CAROTID ANGIOGRAPHY: CATH118230

## 2016-06-22 LAB — GLUCOSE, CAPILLARY
Glucose-Capillary: 96 mg/dL (ref 65–99)
Glucose-Capillary: 88 mg/dL (ref 65–99)

## 2016-06-22 SURGERY — CAROTID ANGIOGRAPHY
Anesthesia: LOCAL

## 2016-06-22 MED ORDER — METFORMIN HCL 500 MG PO TABS: 1000.0000 mg | ORAL_TABLET | Freq: Two times a day (BID) | ORAL | Status: AC

## 2016-06-22 MED ORDER — IODIXANOL 320 MG/ML IV SOLN
INTRAVENOUS | Status: DC | PRN
  Administered 2016-06-22: 100 mL via INTRA_ARTERIAL

## 2016-06-22 MED ORDER — SODIUM CHLORIDE 0.9 % WEIGHT BASED INFUSION: 1.0000 mL/kg/h | INTRAVENOUS | Status: AC

## 2016-06-22 MED ORDER — LABETALOL HCL 5 MG/ML IV SOLN
INTRAVENOUS | Status: AC
  Filled 2016-06-22: qty 4

## 2016-06-22 MED ORDER — SODIUM CHLORIDE 0.9 % WEIGHT BASED INFUSION
3.0000 mL/kg/h | INTRAVENOUS | Status: AC
  Administered 2016-06-22: 3 mL/kg/h via INTRAVENOUS

## 2016-06-22 MED ORDER — HEPARIN (PORCINE) IN NACL 2-0.9 UNIT/ML-% IJ SOLN
INTRAMUSCULAR | Status: DC | PRN
  Administered 2016-06-22: 1000 mL

## 2016-06-22 MED ORDER — SODIUM CHLORIDE 0.9 % WEIGHT BASED INFUSION: 1.0000 mL/kg/h | INTRAVENOUS | Status: DC

## 2016-06-22 MED ORDER — ATROPINE SULFATE 1 MG/10ML IJ SOSY
PREFILLED_SYRINGE | INTRAMUSCULAR | Status: AC
  Filled 2016-06-22: qty 10

## 2016-06-22 MED ORDER — SODIUM CHLORIDE 0.9% FLUSH: 3.0000 mL | INTRAVENOUS | Status: DC | PRN

## 2016-06-22 MED ORDER — SODIUM CHLORIDE 0.9% FLUSH: 3.0000 mL | Freq: Two times a day (BID) | INTRAVENOUS | Status: DC

## 2016-06-22 MED ORDER — LIDOCAINE HCL (PF) 1 % IJ SOLN
INTRAMUSCULAR | Status: AC
  Filled 2016-06-22: qty 30

## 2016-06-22 MED ORDER — LABETALOL HCL 5 MG/ML IV SOLN
INTRAVENOUS | Status: DC | PRN
  Administered 2016-06-22 (×2): 20 mg via INTRAVENOUS

## 2016-06-22 MED ORDER — SODIUM CHLORIDE 0.9 % IV SOLN: 250.0000 mL | INTRAVENOUS | Status: DC | PRN

## 2016-06-22 MED ORDER — ASPIRIN 81 MG PO CHEW
81.0000 mg | CHEWABLE_TABLET | ORAL | Status: AC
  Administered 2016-06-22: 81 mg via ORAL

## 2016-06-22 MED ORDER — CLOPIDOGREL BISULFATE 75 MG PO TABS
75.0000 mg | ORAL_TABLET | Freq: Once | ORAL | Status: AC
  Administered 2016-06-22: 75 mg via ORAL

## 2016-06-22 MED ORDER — LIDOCAINE HCL (PF) 1 % IJ SOLN
INTRAMUSCULAR | Status: DC | PRN
  Administered 2016-06-22: 24 mL

## 2016-06-22 MED ORDER — CLOPIDOGREL BISULFATE 75 MG PO TABS
ORAL_TABLET | ORAL | Status: AC
  Filled 2016-06-22: qty 1

## 2016-06-22 MED ORDER — ASPIRIN 81 MG PO CHEW
CHEWABLE_TABLET | ORAL | Status: AC
  Filled 2016-06-22: qty 1

## 2016-06-22 MED ORDER — HYDRALAZINE HCL 20 MG/ML IJ SOLN
INTRAMUSCULAR | Status: DC | PRN
  Administered 2016-06-22: 20 mg via INTRAVENOUS

## 2016-06-22 MED ORDER — ATROPINE SULFATE 1 MG/10ML IJ SOSY
PREFILLED_SYRINGE | INTRAMUSCULAR | Status: DC | PRN
  Administered 2016-06-22: 0.5 mg via INTRAVENOUS

## 2016-06-22 MED ORDER — HEPARIN (PORCINE) IN NACL 2-0.9 UNIT/ML-% IJ SOLN
INTRAMUSCULAR | Status: AC
  Filled 2016-06-22: qty 1000

## 2016-06-22 SURGICAL SUPPLY — 16 items
CATH CROSS OVER TEMPO 5F (CATHETERS) ×2 IMPLANT
CATH INFINITI 5 FR JL3.5 (CATHETERS) ×1 IMPLANT
CATH INFINITI 5 FR MPA2 (CATHETERS) ×1 IMPLANT
DEVICE WIRE ANGIOSEAL 6FR (Vascular Products) ×2 IMPLANT
GUIDEWIRE 3MM J .035 260CM (WIRE) ×1 IMPLANT
KIT HEART LEFT (KITS) ×3 IMPLANT
KIT MICROINTRODUCER STIFF 5F (SHEATH) ×1 IMPLANT
KIT PV (KITS) ×3 IMPLANT
PACK CARDIAC CATHETERIZATION (CUSTOM PROCEDURE TRAY) ×3 IMPLANT
SHEATH PINNACLE 5F 10CM (SHEATH) ×1 IMPLANT
STOPCOCK MORSE 400PSI 3WAY (MISCELLANEOUS) IMPLANT
SYRINGE MEDRAD AVANTA MACH 7 (SYRINGE) IMPLANT
TRANSDUCER W/STOPCOCK (MISCELLANEOUS) ×3 IMPLANT
TRAY PV CATH (CUSTOM PROCEDURE TRAY) ×3 IMPLANT
TUBING CIL FLEX 10 FLL-RA (TUBING) ×1 IMPLANT
WIRE HITORQ VERSACORE ST 145CM (WIRE) ×1 IMPLANT

## 2016-06-22 NOTE — Discharge Instructions (Signed)

## 2016-06-22 NOTE — Interval H&P Note (Signed)
History and Physical Interval Note:  06/22/2016 7:42 AM  Yvonne Patel  has presented today for surgery, with the diagnosis of carotid stenosis, abnormal stress test, sob  The various methods of treatment have been discussed with the patient and family. After consideration of risks, benefits and other options for treatment, the patient has consented to  Procedure(s): Carotid Angiography (N/A) Left Heart Cath and Coronary Angiography (N/A) and possible PCI as a surgical intervention .  The patient's history has been reviewed, patient examined, no change in status, stable for surgery.  I have reviewed the patient's chart and labs.  Questions were answered to the patient's satisfaction.   Ischemic Symptoms? CCS III (Marked limitation of ordinary activity) Anti-ischemic Medical Therapy? Maximal Medical Therapy (2 or more classes of medications) Non-invasive Test Results? Intermediate-risk stress test findings: cardiac mortality 1-3%/year Prior CABG? No Previous CABG   Patient Information:   1-2V CAD, no prox LAD  A (8)  Indication: 17; Score: 8   Patient Information:   CTO of 1 vessel, no other CAD  A (7)  Indication: 27; Score: 7   Patient Information:   1V CAD with prox LAD  A (9)  Indication: 33; Score: 9   Patient Information:   2V-CAD with prox LAD  A (9)  Indication: 39; Score: 9   Patient Information:   3V-CAD without LMCA  A (9)  Indication: 45; Score: 9   Patient Information:   3V-CAD without LMCA With Abnormal LV systolic function  A (9)  Indication: 48; Score: 9   Patient Information:   LMCA-CAD  A (9)  Indication: 49; Score: 9   Patient Information:   2V-CAD with prox LAD PCI  A (7)  Indication: 62; Score: 7   Patient Information:   2V-CAD with prox LAD CABG  A (8)  Indication: 62; Score: 8   Patient Information:   3V-CAD without LMCA With Low CAD burden(i.e., 3 focal stenoses, low SYNTAX score) PCI  A (7)  Indication: 63;  Score: 7   Patient Information:   3V-CAD without LMCA With Low CAD burden(i.e., 3 focal stenoses, low SYNTAX score) CABG  A (9)  Indication: 63; Score: 9   Patient Information:   3V-CAD without LMCA E06c - Intermediate-high CAD burden (i.e., multiple diffuse lesions, presence of CTO, or high SYNTAX score) PCI  U (4)  Indication: 64; Score: 4   Patient Information:   3V-CAD without LMCA E06c - Intermediate-high CAD burden (i.e., multiple diffuse lesions, presence of CTO, or high SYNTAX score) CABG  A (9)  Indication: 64; Score: 9   Patient Information:   LMCA-CAD With Isolated LMCA stenosis  PCI  U (6)  Indication: 65; Score: 6   Patient Information:   LMCA-CAD With Isolated LMCA stenosis  CABG  A (9)  Indication: 65; Score: 9   Patient Information:   LMCA-CAD Additional CAD, low CAD burden (i.e., 1- to 2-vessel additional involvement, low SYNTAX score) PCI  U (5)  Indication: 66; Score: 5   Patient Information:   LMCA-CAD Additional CAD, low CAD burden (i.e., 1- to 2-vessel additional involvement, low SYNTAX score) CABG  A (9)  Indication: 66; Score: 9   Patient Information:   LMCA-CAD Additional CAD, intermediate-high CAD burden (i.e., 3-vessel involvement, presence of CTO, or high SYNTAX score) PCI  I (3)  Indication: 67; Score: 3   Patient Information:   LMCA-CAD Additional CAD, intermediate-high CAD burden (i.e., 3-vessel involvement, presence of CTO, or high SYNTAX score) CABG  A (9)  Indication: 67; Score: 9   Less [place]

## 2016-06-24 ENCOUNTER — Encounter: Payer: Self-pay | Admitting: Vascular Surgery

## 2016-06-24 ENCOUNTER — Telehealth: Payer: Self-pay | Admitting: Vascular Surgery

## 2016-06-24 NOTE — Telephone Encounter (Signed)
Pt carotid angio reviewed.  80-90 percent calcified left ICA stenosis.  70% right SCA stenosis.  50% right ICA stenosis.  High carotid bifurcation.  May have some cranial nerve issues post procedure will discuss with pt preop  Yvonne Hinds, MD [facility] [place]: [phone] Pager: [phone]

## 2016-06-25 ENCOUNTER — Other Ambulatory Visit: Payer: Self-pay

## 2016-06-28 ENCOUNTER — Telehealth: Payer: Self-pay

## 2016-06-28 NOTE — Telephone Encounter (Signed)
Pt. notified to continue taking Plavix and ASA, prior to surgery.  Verb. Understanding.

## 2016-06-28 NOTE — Telephone Encounter (Signed)
-----   Message from Elam Dutch, MD sent at 06/28/2016 10:13 AM EDT ----- Regarding: RE: recommendation on Plavix, pre-op Yes she needs both for a recent cardiac stent  [name] ----- Message ----- From: Denman George, RN Sent: 06/25/2016   4:57 PM To: Elam Dutch, MD Subject: recommendation on Plavix, pre-op               I spoke with her and scheduled surgery for left CEA on 07/05/16.  Do you want her to continue the Plavix pre-op?  (she does also take ASA 81 mg qd)

## 2016-07-01 ENCOUNTER — Other Ambulatory Visit: Payer: Self-pay | Admitting: Cardiology

## 2016-07-01 ENCOUNTER — Encounter (HOSPITAL_COMMUNITY)
Admission: RE | Admit: 2016-07-01 | Discharge: 2016-07-01 | Disposition: A | Discharge status: Home / Self Care | Payer: PPO | Source: Ambulatory Visit | Attending: Vascular Surgery | Admitting: Vascular Surgery

## 2016-07-01 ENCOUNTER — Encounter (HOSPITAL_COMMUNITY): Payer: Self-pay

## 2016-07-01 DIAGNOSIS — I6523 Occlusion and stenosis of bilateral carotid arteries: Secondary | ICD-10-CM | POA: Insufficient documentation

## 2016-07-01 DIAGNOSIS — Z01818 Encounter for other preprocedural examination: Secondary | ICD-10-CM | POA: Diagnosis not present

## 2016-07-01 DIAGNOSIS — Z1231 Encounter for screening mammogram for malignant neoplasm of breast: Secondary | ICD-10-CM

## 2016-07-01 HISTORY — DX: Atherosclerotic heart disease of native coronary artery without angina pectoris: I25.10

## 2016-07-01 HISTORY — DX: Cardiomyopathy, unspecified: I42.9

## 2016-07-01 LAB — PROTIME-INR
INR: 0.98
PROTHROMBIN TIME: 13 s (ref 11.4–15.2)

## 2016-07-01 LAB — CBC
HCT: 34.3 % — ABNORMAL LOW (ref 36.0–46.0)
Hemoglobin: 11.4 g/dL — ABNORMAL LOW (ref 12.0–15.0)
MCH: 30.3 pg (ref 26.0–34.0)
MCHC: 33.2 g/dL (ref 30.0–36.0)
MCV: 91.2 fL (ref 78.0–100.0)
PLATELETS: 164 10*3/uL (ref 150–400)
RBC: 3.76 MIL/uL — AB (ref 3.87–5.11)
RDW: 12.9 % (ref 11.5–15.5)
WBC: 6.7 10*3/uL (ref 4.0–10.5)

## 2016-07-01 LAB — URINALYSIS, ROUTINE W REFLEX MICROSCOPIC
Bilirubin Urine: NEGATIVE
GLUCOSE, UA: 50 mg/dL — AB
HGB URINE DIPSTICK: NEGATIVE
Ketones, ur: NEGATIVE mg/dL
NITRITE: NEGATIVE
PH: 5 (ref 5.0–8.0)
Protein, ur: >=300 mg/dL — AB
Specific Gravity, Urine: 1.019 (ref 1.005–1.030)

## 2016-07-01 LAB — GLUCOSE, CAPILLARY: Glucose-Capillary: 133 mg/dL — ABNORMAL HIGH (ref 65–99)

## 2016-07-01 LAB — COMPREHENSIVE METABOLIC PANEL
ALBUMIN: 3.1 g/dL — AB (ref 3.5–5.0)
ALT: 16 U/L (ref 14–54)
ANION GAP: 9 (ref 5–15)
AST: 20 U/L (ref 15–41)
Alkaline Phosphatase: 99 U/L (ref 38–126)
BILIRUBIN TOTAL: 0.4 mg/dL (ref 0.3–1.2)
BUN: 32 mg/dL — AB (ref 6–20)
CHLORIDE: 104 mmol/L (ref 101–111)
CO2: 27 mmol/L (ref 22–32)
Calcium: 9.2 mg/dL (ref 8.9–10.3)
Creatinine, Ser: 1.61 mg/dL — ABNORMAL HIGH (ref 0.44–1.00)
GFR calc Af Amer: 36 mL/min — ABNORMAL LOW (ref >60)
GFR calc non Af Amer: 31 mL/min — ABNORMAL LOW (ref >60)
GLUCOSE: 127 mg/dL — AB (ref 65–99)
POTASSIUM: 3.1 mmol/L — AB (ref 3.5–5.1)
SODIUM: 140 mmol/L (ref 135–145)
TOTAL PROTEIN: 6.8 g/dL (ref 6.5–8.1)

## 2016-07-01 LAB — SURGICAL PCR SCREEN
MRSA, PCR: NEGATIVE
Staphylococcus aureus: NEGATIVE

## 2016-07-01 LAB — NO BLOOD PRODUCTS

## 2016-07-01 LAB — APTT: APTT: 26 s (ref 24–36)

## 2016-07-01 NOTE — Pre-Procedure Instructions (Signed)
ROSAMAE ROCQUE  07/01/2016      [facility] - [address], [place] [address] Phone: [phone] Fax: [phone]  [facility] [place], [place] - [address] [place] [address] Phone: [phone] Fax: [phone]    Your procedure is scheduled on April 30 @ 1045  Report to [facility] at [place].M.  Call this number if you have problems the morning of surgery:  [phone]   Remember:  Do not eat food or drink liquids after midnight.   Take these medicines the morning of surgery with A SIP OF WATER amLODipine (NORVASC),  labetalol (NORMODYNE, aspirin,   Follow Doctors instructions regarding PLAVIX  7 days prior to surgery STOP taking any  Aleve, Naproxen, Ibuprofen, Motrin, Advil, Goody's, BC's, all herbal medications, fish oil, and all vitamins   WHAT DO I DO ABOUT MY DIABETES MEDICATION?   Marland Kitchen Do not take oral diabetes medicines (pills) the morning of surgery. metFORMIN (GLUCOPHAGE)   Day before surgery continue insulin degludec (TRESIBA FLEXTOUCH) as usual for morning dose    . THE MORNING OF SURGERY, take _15_ units of insulin degludec (TRESIBA FLEXTOUCH)__insulin.  . The day of surgery, do not take other diabetes injectables, including Byetta (exenatide), Bydureon (exenatide ER), Victoza (liraglutide), or Trulicity (dulaglutide).  How to Manage Your Diabetes Before and After Surgery  Why is it important to control my blood sugar before and after surgery? . Improving blood sugar levels before and after surgery helps healing and can limit problems. . A way of improving blood sugar control is eating a healthy diet by: o  Eating less sugar and carbohydrates o  Increasing activity/exercise o  Talking with your doctor about reaching your blood sugar goals . High blood sugars (greater than  180 mg/dL) can raise your risk of infections and slow your recovery, so you will need to focus on controlling your diabetes during the weeks before surgery. . Make sure that the doctor who takes care of your diabetes knows about your planned surgery including the date and location.  How do I manage my blood sugar before surgery? . Check your blood sugar at least 4 times a day, starting 2 days before surgery, to make sure that the level is not too high or low. o Check your blood sugar the morning of your surgery when you wake up and every 2 hours until you get to the Short Stay unit. . If your blood sugar is less than 70 mg/dL, you will need to treat for low blood sugar: o Do not take insulin. o Treat a low blood sugar (less than 70 mg/dL) with  cup of clear juice (cranberry or apple), 4 glucose tablets, OR glucose gel. o Recheck blood sugar in 15 minutes after treatment (to make sure it is greater than 70 mg/dL). If your blood sugar is not greater than 70 mg/dL on recheck, call [phone] for further instructions. . Report your blood sugar to the short stay nurse when you get to Short Stay.  . If you are admitted to the hospital after surgery: o Your blood sugar will be checked by the staff and you will probably be given insulin after surgery (instead of oral diabetes medicines) to make sure you have good blood sugar levels. o The goal for blood sugar control after surgery is 80-180 mg/dL.  Do not wear jewelry, make-up or nail polish.  Do not wear lotions, powders, or perfumes, or deoderant.  Do not shave 48 hours prior to surgery.  Men may shave face and neck.  Do not bring valuables to the hospital.  [facility] is not responsible for any belongings or valuables.  Contacts, dentures or bridgework may not be worn into surgery.  Leave your suitcase in the car.  After surgery it may be brought to your room.  For patients admitted to the hospital, discharge time will be determined by your  treatment team.  Patients discharged the day of surgery will not be allowed to drive home.    Special instructions:   [facility]- Preparing For Surgery  Before surgery, you can play an important role. Because skin is not sterile, your skin needs to be as free of germs as possible. You can reduce the number of germs on your skin by washing with CHG (chlorahexidine gluconate) Soap before surgery.  CHG is an antiseptic cleaner which kills germs and bonds with the skin to continue killing germs even after washing.  Please do not use if you have an allergy to CHG or antibacterial soaps. If your skin becomes reddened/irritated stop using the CHG.  Do not shave (including legs and underarms) for at least 48 hours prior to first CHG shower. It is OK to shave your face.  Please follow these instructions carefully.   1. Shower the NIGHT BEFORE SURGERY and the MORNING OF SURGERY with CHG.   2. If you chose to wash your hair, wash your hair first as usual with your normal shampoo.  3. After you shampoo, rinse your hair and body thoroughly to remove the shampoo.  4. Use CHG as you would any other liquid soap. You can apply CHG directly to the skin and wash gently with a scrungie or a clean washcloth.   5. Apply the CHG Soap to your body ONLY FROM THE NECK DOWN.  Do not use on open wounds or open sores. Avoid contact with your eyes, ears, mouth and genitals (private parts). Wash genitals (private parts) with your normal soap.  6. Wash thoroughly, paying special attention to the area where your surgery will be performed.  7. Thoroughly rinse your body with warm water from the neck down.  8. DO NOT shower/wash with your normal soap after using and rinsing off the CHG Soap.  9. Pat yourself dry with a CLEAN TOWEL.   10. Wear CLEAN PAJAMAS   11. Place CLEAN SHEETS on your bed the night of your first shower and DO NOT SLEEP WITH PETS.    Day of Surgery: Do not apply any deodorants/lotions.  Please wear clean clothes to the hospital/surgery center.      Please read over the following fact sheets that you were given.

## 2016-07-01 NOTE — Progress Notes (Signed)
PCP - Jeanann Lewandowsky Cardiologist - [name]  Chest x-ray - not needed EKG - 06/23/16 Stress Test - denies ECHO - 09/29/11 Cardiac Cath - 06/22/16  Sleep Study -  CPAP -   Fasting Blood Sugar - 100-105 Checks Blood Sugar __3-4___ times a day   Sending to anesthesia for review of cardiac history Dr. Oneida Alar office to make them aware  Patient denies shortness of breath, fever, cough and chest pain at PAT appointment   Patient verbalized understanding of instructions that was given to them at the PAT appointment. Patient expressed that there were no further questions.  Patient was also instructed that they will need to review over the PAT instructions again at home before the surgery.

## 2016-07-02 ENCOUNTER — Encounter (HOSPITAL_COMMUNITY): Payer: Self-pay | Admitting: Emergency Medicine

## 2016-07-02 ENCOUNTER — Encounter (HOSPITAL_COMMUNITY): Payer: Self-pay | Admitting: Certified Registered Nurse Anesthetist

## 2016-07-02 ENCOUNTER — Encounter (HOSPITAL_COMMUNITY): Payer: Self-pay

## 2016-07-02 DIAGNOSIS — E1151 Type 2 diabetes mellitus with diabetic peripheral angiopathy without gangrene: Secondary | ICD-10-CM | POA: Diagnosis not present

## 2016-07-02 DIAGNOSIS — I10 Essential (primary) hypertension: Secondary | ICD-10-CM | POA: Diagnosis not present

## 2016-07-02 DIAGNOSIS — R0609 Other forms of dyspnea: Secondary | ICD-10-CM | POA: Diagnosis not present

## 2016-07-02 DIAGNOSIS — I25119 Atherosclerotic heart disease of native coronary artery with unspecified angina pectoris: Secondary | ICD-10-CM | POA: Diagnosis not present

## 2016-07-02 LAB — HEMOGLOBIN A1C
Hgb A1c MFr Bld: 6.6 % — ABNORMAL HIGH (ref 4.8–5.6)
Mean Plasma Glucose: 143 mg/dL

## 2016-07-02 NOTE — Progress Notes (Signed)
Anesthesia Chart Review:  Note patient refuses all blood products.  Pt is a 71 year old female scheduled for L CEA on 07/05/2016 with [name], M.D.  - PCP is Kelton Pillar, M.D. - Cardiologist is Kela Millin, MD  PMH includes: CAD, cardiomyopathy, HTN, DM, hyperlipidemia, PAD, TIA, syncope, renal artery stenosis (s/p R renal artery stenting 03/11/15). Former smoker. BMI 31.  Medications include: Amlodipine, ASA 81 mg, Lipitor, Plavix, Zetia, tresiba, labetalol, losartan-HCTZ, metformin. Pt to stay on Plavix perioperatively  Preoperative labs reviewed.  - HbA1c 6.6, glucose 127. - CR 1.61, BUN 32. This is slightly elevated from results a year ago (Cr 1.35 03/12/15) but pt recently underwent cath and received contrast.  Recommend renal function be followed carefully post-op.   EKG 06/22/16: NSR. Possible LA enlargement. Incomplete RBBB. Septal infarct, age undetermined. ST & T wave abnormality, consider inferior ischemia. ST & T wave abnormality, consider anterolateral ischemia  Cardiac cath 06/22/16:   Ost RCA lesion, 100 %stenosed; has collaterals  Ost 1st Diag to 1st Diag lesion, 80 %stenosed.  Ost 2nd Mrg to 2nd Mrg lesion, 80 %stenosed.  There is mild left ventricular systolic dysfunction.  LV end diastolic pressure is normal.  The left ventricular ejection fraction is 35-45% by visual estimate. - Recommendation: medical therapy.   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 - Right subclavian artery with a high-grade and complex calcified 90% stenosis at its origin followed by ectasia and a tandem 90% stenosis. Right vertebral occluded. - Right ICA calcific 60-70% stenosis. Left ICA complex calcified 90% stenosis.  If no changes, I anticipate pt can proceed with surgery as scheduled.    Willeen Cass, FNP-BC [facility]/Anesthesiology Phone: [phone] 07/02/2016 2:05 PM

## 2016-07-05 ENCOUNTER — Inpatient Hospital Stay (HOSPITAL_COMMUNITY): Admission: RE | Admit: 2016-07-05 | Payer: PPO | Source: Ambulatory Visit | Admitting: Vascular Surgery

## 2016-07-05 ENCOUNTER — Encounter (HOSPITAL_COMMUNITY): Admission: RE | Payer: Self-pay | Source: Ambulatory Visit

## 2016-07-05 SURGERY — ENDARTERECTOMY, CAROTID
Anesthesia: General | Site: Neck | Laterality: Left

## 2016-07-05 NOTE — Progress Notes (Signed)
Patient called to inform nurse that she is vomiting and has diarrhea.  Dr. Oneida Alar notified and stated that surgery needed to be canceled.  Nurse left voicemail for patient informing that surgery was canceled for today and to contact his office.

## 2016-07-14 ENCOUNTER — Encounter (HOSPITAL_COMMUNITY): Payer: Self-pay | Admitting: Urology

## 2016-07-14 ENCOUNTER — Inpatient Hospital Stay (HOSPITAL_COMMUNITY)
Admission: RE | Admit: 2016-07-14 | Discharge: 2016-07-15 | DRG: 039 | Disposition: A | Discharge status: Home / Self Care | Payer: PPO | Source: Ambulatory Visit | Attending: Vascular Surgery | Admitting: Vascular Surgery

## 2016-07-14 ENCOUNTER — Inpatient Hospital Stay (HOSPITAL_COMMUNITY): Payer: PPO | Admitting: Certified Registered Nurse Anesthetist

## 2016-07-14 ENCOUNTER — Encounter (HOSPITAL_COMMUNITY)
Admission: RE | Disposition: A | Discharge status: Home / Self Care | Payer: Self-pay | Source: Ambulatory Visit | Attending: Vascular Surgery

## 2016-07-14 DIAGNOSIS — Z79899 Other long term (current) drug therapy: Secondary | ICD-10-CM

## 2016-07-14 DIAGNOSIS — E1151 Type 2 diabetes mellitus with diabetic peripheral angiopathy without gangrene: Secondary | ICD-10-CM | POA: Diagnosis not present

## 2016-07-14 DIAGNOSIS — Z95828 Presence of other vascular implants and grafts: Secondary | ICD-10-CM

## 2016-07-14 DIAGNOSIS — Z87891 Personal history of nicotine dependence: Secondary | ICD-10-CM | POA: Diagnosis not present

## 2016-07-14 DIAGNOSIS — Z8673 Personal history of transient ischemic attack (TIA), and cerebral infarction without residual deficits: Secondary | ICD-10-CM

## 2016-07-14 DIAGNOSIS — E785 Hyperlipidemia, unspecified: Secondary | ICD-10-CM | POA: Diagnosis not present

## 2016-07-14 DIAGNOSIS — E11319 Type 2 diabetes mellitus with unspecified diabetic retinopathy without macular edema: Secondary | ICD-10-CM | POA: Diagnosis not present

## 2016-07-14 DIAGNOSIS — Z7982 Long term (current) use of aspirin: Secondary | ICD-10-CM | POA: Diagnosis not present

## 2016-07-14 DIAGNOSIS — Z7902 Long term (current) use of antithrombotics/antiplatelets: Secondary | ICD-10-CM | POA: Diagnosis not present

## 2016-07-14 DIAGNOSIS — G629 Polyneuropathy, unspecified: Secondary | ICD-10-CM | POA: Diagnosis not present

## 2016-07-14 DIAGNOSIS — I2510 Atherosclerotic heart disease of native coronary artery without angina pectoris: Secondary | ICD-10-CM | POA: Diagnosis not present

## 2016-07-14 DIAGNOSIS — Z794 Long term (current) use of insulin: Secondary | ICD-10-CM

## 2016-07-14 DIAGNOSIS — I6522 Occlusion and stenosis of left carotid artery: Principal | ICD-10-CM | POA: Diagnosis present

## 2016-07-14 DIAGNOSIS — I10 Essential (primary) hypertension: Secondary | ICD-10-CM | POA: Diagnosis present

## 2016-07-14 DIAGNOSIS — I6529 Occlusion and stenosis of unspecified carotid artery: Secondary | ICD-10-CM | POA: Diagnosis present

## 2016-07-14 DIAGNOSIS — I739 Peripheral vascular disease, unspecified: Secondary | ICD-10-CM | POA: Diagnosis not present

## 2016-07-14 DIAGNOSIS — H359 Unspecified retinal disorder: Secondary | ICD-10-CM | POA: Diagnosis present

## 2016-07-14 DIAGNOSIS — I708 Atherosclerosis of other arteries: Secondary | ICD-10-CM | POA: Diagnosis present

## 2016-07-14 DIAGNOSIS — E1140 Type 2 diabetes mellitus with diabetic neuropathy, unspecified: Secondary | ICD-10-CM | POA: Diagnosis present

## 2016-07-14 HISTORY — PX: PATCH ANGIOPLASTY: SHX6230

## 2016-07-14 HISTORY — PX: ENDARTERECTOMY: SHX5162

## 2016-07-14 LAB — CBC
HEMATOCRIT: 27.9 % — AB (ref 36.0–46.0)
Hemoglobin: 9.2 g/dL — ABNORMAL LOW (ref 12.0–15.0)
MCH: 30 pg (ref 26.0–34.0)
MCHC: 33 g/dL (ref 30.0–36.0)
MCV: 90.9 fL (ref 78.0–100.0)
PLATELETS: 125 10*3/uL — AB (ref 150–400)
RBC: 3.07 MIL/uL — ABNORMAL LOW (ref 3.87–5.11)
RDW: 12.9 % (ref 11.5–15.5)
WBC: 9.3 10*3/uL (ref 4.0–10.5)

## 2016-07-14 LAB — CREATININE, SERUM
Creatinine, Ser: 1.31 mg/dL — ABNORMAL HIGH (ref 0.44–1.00)
GFR, EST AFRICAN AMERICAN: 46 mL/min — AB (ref >60)
GFR, EST NON AFRICAN AMERICAN: 40 mL/min — AB (ref >60)

## 2016-07-14 LAB — GLUCOSE, CAPILLARY
GLUCOSE-CAPILLARY: 132 mg/dL — AB (ref 65–99)
Glucose-Capillary: 168 mg/dL — ABNORMAL HIGH (ref 65–99)
Glucose-Capillary: 143 mg/dL — ABNORMAL HIGH (ref 65–99)
Glucose-Capillary: 188 mg/dL — ABNORMAL HIGH (ref 65–99)

## 2016-07-14 LAB — NO BLOOD PRODUCTS

## 2016-07-14 SURGERY — ENDARTERECTOMY, CAROTID
Anesthesia: General | Site: Neck | Laterality: Left

## 2016-07-14 MED ORDER — ENOXAPARIN SODIUM 30 MG/0.3ML ~~LOC~~ SOLN
30.0000 mg | SUBCUTANEOUS | Status: DC
  Administered 2016-07-15: 30 mg via SUBCUTANEOUS
  Filled 2016-07-14: qty 0.3

## 2016-07-14 MED ORDER — PHENYLEPHRINE HCL 10 MG/ML IJ SOLN
INTRAVENOUS | Status: DC | PRN
  Administered 2016-07-14: 40 ug/min via INTRAVENOUS

## 2016-07-14 MED ORDER — LOSARTAN POTASSIUM 50 MG PO TABS
100.0000 mg | ORAL_TABLET | Freq: Every day | ORAL | Status: DC
  Administered 2016-07-14: 100 mg via ORAL
  Filled 2016-07-14: qty 2

## 2016-07-14 MED ORDER — FENTANYL CITRATE (PF) 250 MCG/5ML IJ SOLN
INTRAMUSCULAR | Status: AC
  Filled 2016-07-14: qty 5

## 2016-07-14 MED ORDER — GUAIFENESIN-DM 100-10 MG/5ML PO SYRP: 15.0000 mL | ORAL_SOLUTION | ORAL | Status: DC | PRN

## 2016-07-14 MED ORDER — ONDANSETRON HCL 4 MG/2ML IJ SOLN
4.0000 mg | Freq: Four times a day (QID) | INTRAMUSCULAR | Status: DC | PRN
  Administered 2016-07-15: 4 mg via INTRAVENOUS
  Filled 2016-07-14: qty 2

## 2016-07-14 MED ORDER — PROPOFOL 10 MG/ML IV BOLUS
INTRAVENOUS | Status: AC
  Filled 2016-07-14: qty 20

## 2016-07-14 MED ORDER — DEXTROSE 5 % IV SOLN
1.5000 g | Freq: Two times a day (BID) | INTRAVENOUS | Status: AC
  Administered 2016-07-14 – 2016-07-15 (×2): 1.5 g via INTRAVENOUS
  Filled 2016-07-14 (×2): qty 1.5

## 2016-07-14 MED ORDER — HEMOSTATIC AGENTS (NO CHARGE) OPTIME
TOPICAL | Status: DC | PRN
  Administered 2016-07-14: 3 via TOPICAL

## 2016-07-14 MED ORDER — HYDROMORPHONE HCL 1 MG/ML IJ SOLN
INTRAMUSCULAR | Status: AC
  Filled 2016-07-14: qty 0.5

## 2016-07-14 MED ORDER — POTASSIUM CHLORIDE CRYS ER 20 MEQ PO TBCR: 20.0000 meq | EXTENDED_RELEASE_TABLET | Freq: Every day | ORAL | Status: DC | PRN

## 2016-07-14 MED ORDER — MEPERIDINE HCL 25 MG/ML IJ SOLN: 6.2500 mg | INTRAMUSCULAR | Status: DC | PRN

## 2016-07-14 MED ORDER — LIDOCAINE HCL 1 % IJ SOLN
INTRAMUSCULAR | Status: AC
  Filled 2016-07-14: qty 20

## 2016-07-14 MED ORDER — METOPROLOL TARTRATE 5 MG/5ML IV SOLN: 2.0000 mg | INTRAVENOUS | Status: DC | PRN

## 2016-07-14 MED ORDER — AMLODIPINE BESYLATE 10 MG PO TABS
10.0000 mg | ORAL_TABLET | Freq: Every day | ORAL | Status: DC
  Administered 2016-07-15: 10 mg via ORAL
  Filled 2016-07-14: qty 1

## 2016-07-14 MED ORDER — VITAMIN D (ERGOCALCIFEROL) 1.25 MG (50000 UNIT) PO CAPS: 50000.0000 [IU] | ORAL_CAPSULE | ORAL | Status: DC

## 2016-07-14 MED ORDER — METFORMIN HCL 500 MG PO TABS
1000.0000 mg | ORAL_TABLET | Freq: Two times a day (BID) | ORAL | Status: DC
  Administered 2016-07-14 – 2016-07-15 (×3): 1000 mg via ORAL
  Filled 2016-07-14 (×3): qty 2

## 2016-07-14 MED ORDER — DEXTRAN 40 IN SALINE 10-0.9 % IV SOLN
INTRAVENOUS | Status: DC | PRN
  Administered 2016-07-14: 25 mL/h

## 2016-07-14 MED ORDER — MAGNESIUM SULFATE 2 GM/50ML IV SOLN: 2.0000 g | Freq: Every day | INTRAVENOUS | Status: DC | PRN

## 2016-07-14 MED ORDER — POLYETHYLENE GLYCOL 3350 17 G PO PACK: 17.0000 g | PACK | Freq: Every day | ORAL | Status: DC | PRN

## 2016-07-14 MED ORDER — FENTANYL CITRATE (PF) 100 MCG/2ML IJ SOLN
INTRAMUSCULAR | Status: DC | PRN
  Administered 2016-07-14 (×2): 25 ug via INTRAVENOUS
  Administered 2016-07-14: 50 ug via INTRAVENOUS
  Administered 2016-07-14: 150 ug via INTRAVENOUS
  Administered 2016-07-14: 50 ug via INTRAVENOUS

## 2016-07-14 MED ORDER — 0.9 % SODIUM CHLORIDE (POUR BTL) OPTIME
TOPICAL | Status: DC | PRN
  Administered 2016-07-14: 2000 mL

## 2016-07-14 MED ORDER — PREDNISOLONE ACETATE 1 % OP SUSP
1.0000 [drp] | Freq: Four times a day (QID) | OPHTHALMIC | Status: DC
  Administered 2016-07-14 – 2016-07-15 (×4): 1 [drp] via OPHTHALMIC
  Filled 2016-07-14: qty 1

## 2016-07-14 MED ORDER — ALUM & MAG HYDROXIDE-SIMETH 200-200-20 MG/5ML PO SUSP: 15.0000 mL | ORAL | Status: DC | PRN

## 2016-07-14 MED ORDER — DEXTROSE 5 % IV SOLN
INTRAVENOUS | Status: AC
  Filled 2016-07-14: qty 1.5

## 2016-07-14 MED ORDER — DOCUSATE SODIUM 100 MG PO CAPS
100.0000 mg | ORAL_CAPSULE | Freq: Every day | ORAL | Status: DC
  Administered 2016-07-15: 100 mg via ORAL
  Filled 2016-07-14: qty 1

## 2016-07-14 MED ORDER — MIDAZOLAM HCL 5 MG/5ML IJ SOLN
INTRAMUSCULAR | Status: DC | PRN
  Administered 2016-07-14: 2 mg via INTRAVENOUS

## 2016-07-14 MED ORDER — SUGAMMADEX SODIUM 200 MG/2ML IV SOLN
INTRAVENOUS | Status: DC | PRN
Start: 2016-07-14 — End: 2016-07-14
  Administered 2016-07-14: 400 mg via INTRAVENOUS

## 2016-07-14 MED ORDER — ONDANSETRON HCL 4 MG/2ML IJ SOLN
INTRAMUSCULAR | Status: DC | PRN
  Administered 2016-07-14: 4 mg via INTRAVENOUS

## 2016-07-14 MED ORDER — EZETIMIBE 10 MG PO TABS
10.0000 mg | ORAL_TABLET | Freq: Every evening | ORAL | Status: DC
  Administered 2016-07-14 – 2016-07-15 (×2): 10 mg via ORAL
  Filled 2016-07-14 (×2): qty 1

## 2016-07-14 MED ORDER — ACETAMINOPHEN 325 MG PO TABS: 325.0000 mg | ORAL_TABLET | ORAL | Status: DC | PRN

## 2016-07-14 MED ORDER — PHENOL 1.4 % MT LIQD: 1.0000 | OROMUCOSAL | Status: DC | PRN

## 2016-07-14 MED ORDER — HYDRALAZINE HCL 20 MG/ML IJ SOLN: 5.0000 mg | INTRAMUSCULAR | Status: DC | PRN

## 2016-07-14 MED ORDER — LABETALOL HCL 200 MG PO TABS
200.0000 mg | ORAL_TABLET | Freq: Two times a day (BID) | ORAL | Status: DC
  Administered 2016-07-14 – 2016-07-15 (×2): 200 mg via ORAL
  Filled 2016-07-14 (×2): qty 1

## 2016-07-14 MED ORDER — ONDANSETRON HCL 4 MG/2ML IJ SOLN: 4.0000 mg | Freq: Once | INTRAMUSCULAR | Status: DC | PRN

## 2016-07-14 MED ORDER — PHENYLEPHRINE HCL 10 MG/ML IJ SOLN
INTRAMUSCULAR | Status: DC | PRN
  Administered 2016-07-14 (×2): 80 ug via INTRAVENOUS
  Administered 2016-07-14: 40 ug via INTRAVENOUS
  Administered 2016-07-14 (×3): 80 ug via INTRAVENOUS

## 2016-07-14 MED ORDER — HEPARIN SODIUM (PORCINE) 1000 UNIT/ML IJ SOLN
INTRAMUSCULAR | Status: DC | PRN
  Administered 2016-07-14: 5000 [IU] via INTRAVENOUS
  Administered 2016-07-14: 8000 [IU] via INTRAVENOUS

## 2016-07-14 MED ORDER — INSULIN GLARGINE 100 UNIT/ML ~~LOC~~ SOLN
30.0000 [IU] | Freq: Every day | SUBCUTANEOUS | Status: DC
  Administered 2016-07-15: 30 [IU] via SUBCUTANEOUS
  Filled 2016-07-14: qty 0.3

## 2016-07-14 MED ORDER — PROTAMINE SULFATE 10 MG/ML IV SOLN
INTRAVENOUS | Status: DC | PRN
  Administered 2016-07-14: 30 mg via INTRAVENOUS
  Administered 2016-07-14: 20 mg via INTRAVENOUS

## 2016-07-14 MED ORDER — LACTATED RINGERS IV SOLN
INTRAVENOUS | Status: DC | PRN
  Administered 2016-07-14 (×3): via INTRAVENOUS

## 2016-07-14 MED ORDER — PANTOPRAZOLE SODIUM 40 MG PO TBEC
40.0000 mg | DELAYED_RELEASE_TABLET | Freq: Every day | ORAL | Status: DC
  Administered 2016-07-14 – 2016-07-15 (×2): 40 mg via ORAL
  Filled 2016-07-14 (×2): qty 1

## 2016-07-14 MED ORDER — DEXTRAN 40 IN SALINE 10-0.9 % IV SOLN
INTRAVENOUS | Status: AC
  Filled 2016-07-14: qty 500

## 2016-07-14 MED ORDER — HYDROCHLOROTHIAZIDE 12.5 MG PO CAPS
12.5000 mg | ORAL_CAPSULE | Freq: Every day | ORAL | Status: DC
  Administered 2016-07-14: 12.5 mg via ORAL
  Filled 2016-07-14: qty 1

## 2016-07-14 MED ORDER — EPHEDRINE SULFATE 50 MG/ML IJ SOLN
INTRAMUSCULAR | Status: DC | PRN
  Administered 2016-07-14: 10 mg via INTRAVENOUS
  Administered 2016-07-14 (×2): 5 mg via INTRAVENOUS

## 2016-07-14 MED ORDER — LABETALOL HCL 5 MG/ML IV SOLN: 10.0000 mg | INTRAVENOUS | Status: DC | PRN

## 2016-07-14 MED ORDER — MIDAZOLAM HCL 2 MG/2ML IJ SOLN
INTRAMUSCULAR | Status: AC
  Filled 2016-07-14: qty 2

## 2016-07-14 MED ORDER — SODIUM CHLORIDE 0.9 % IV SOLN: 500.0000 mL | Freq: Once | INTRAVENOUS | Status: DC | PRN

## 2016-07-14 MED ORDER — ROCURONIUM BROMIDE 100 MG/10ML IV SOLN
INTRAVENOUS | Status: DC | PRN
  Administered 2016-07-14: 20 mg via INTRAVENOUS
  Administered 2016-07-14: 30 mg via INTRAVENOUS
  Administered 2016-07-14: 50 mg via INTRAVENOUS

## 2016-07-14 MED ORDER — LIDOCAINE HCL (CARDIAC) 20 MG/ML IV SOLN
INTRAVENOUS | Status: DC | PRN
  Administered 2016-07-14: 100 mg via INTRAVENOUS

## 2016-07-14 MED ORDER — ATORVASTATIN CALCIUM 80 MG PO TABS
80.0000 mg | ORAL_TABLET | Freq: Every day | ORAL | Status: DC
  Administered 2016-07-14 – 2016-07-15 (×2): 80 mg via ORAL
  Filled 2016-07-14 (×2): qty 1

## 2016-07-14 MED ORDER — MORPHINE SULFATE (PF) 4 MG/ML IV SOLN: 2.0000 mg | INTRAVENOUS | Status: DC | PRN

## 2016-07-14 MED ORDER — PROTAMINE SULFATE 10 MG/ML IV SOLN
INTRAVENOUS | Status: AC
  Filled 2016-07-14: qty 5

## 2016-07-14 MED ORDER — ASPIRIN EC 81 MG PO TBEC: 81.0000 mg | DELAYED_RELEASE_TABLET | Freq: Every day | ORAL | Status: DC

## 2016-07-14 MED ORDER — CLOPIDOGREL BISULFATE 75 MG PO TABS: 75.0000 mg | ORAL_TABLET | Freq: Every day | ORAL | Status: DC

## 2016-07-14 MED ORDER — HEPARIN SODIUM (PORCINE) 1000 UNIT/ML IJ SOLN
INTRAMUSCULAR | Status: AC
  Filled 2016-07-14: qty 2

## 2016-07-14 MED ORDER — SODIUM CHLORIDE 0.9 % IV SOLN
INTRAVENOUS | Status: DC
  Administered 2016-07-14 – 2016-07-15 (×2): via INTRAVENOUS

## 2016-07-14 MED ORDER — LOSARTAN POTASSIUM-HCTZ 100-25 MG PO TABS: 1.0000 | ORAL_TABLET | Freq: Every evening | ORAL | Status: DC

## 2016-07-14 MED ORDER — BISACODYL 5 MG PO TBEC: 5.0000 mg | DELAYED_RELEASE_TABLET | Freq: Every day | ORAL | Status: DC | PRN

## 2016-07-14 MED ORDER — SODIUM CHLORIDE 0.9 % IV SOLN
INTRAVENOUS | Status: DC | PRN
  Administered 2016-07-14: 10:00:00

## 2016-07-14 MED ORDER — PROPOFOL 10 MG/ML IV BOLUS
INTRAVENOUS | Status: DC | PRN
  Administered 2016-07-14: 150 mg via INTRAVENOUS

## 2016-07-14 MED ORDER — ACETAMINOPHEN 650 MG RE SUPP: 325.0000 mg | RECTAL | Status: DC | PRN

## 2016-07-14 MED ORDER — INSULIN ASPART 100 UNIT/ML ~~LOC~~ SOLN
0.0000 [IU] | Freq: Three times a day (TID) | SUBCUTANEOUS | Status: DC
  Administered 2016-07-14: 2 [IU] via SUBCUTANEOUS
  Administered 2016-07-15 (×2): 1 [IU] via SUBCUTANEOUS

## 2016-07-14 MED ORDER — HYDROMORPHONE HCL 1 MG/ML IJ SOLN
0.2500 mg | INTRAMUSCULAR | Status: DC | PRN
  Administered 2016-07-14 (×2): 0.25 mg via INTRAVENOUS

## 2016-07-14 MED ORDER — OXYCODONE-ACETAMINOPHEN 5-325 MG PO TABS
1.0000 | ORAL_TABLET | ORAL | Status: DC | PRN
  Administered 2016-07-15: 2 via ORAL
  Filled 2016-07-14: qty 2

## 2016-07-14 MED ORDER — ASPIRIN EC 325 MG PO TBEC
325.0000 mg | DELAYED_RELEASE_TABLET | Freq: Every day | ORAL | Status: DC
  Administered 2016-07-14 – 2016-07-15 (×2): 325 mg via ORAL
  Filled 2016-07-14 (×2): qty 1

## 2016-07-14 MED ORDER — DEXTROSE 5 % IV SOLN
INTRAVENOUS | Status: DC | PRN
  Administered 2016-07-14: 1.5 g via INTRAVENOUS

## 2016-07-14 SURGICAL SUPPLY — 51 items
ADH SKN CLS APL DERMABOND .7 (GAUZE/BANDAGES/DRESSINGS) ×1
AGENT HMST SPONGE THK3/8 (HEMOSTASIS)
CANISTER SUCT 3000ML PPV (MISCELLANEOUS) ×2 IMPLANT
CANNULA VESSEL 3MM 2 BLNT TIP (CANNULA) ×4 IMPLANT
CATH ROBINSON RED A/P 18FR (CATHETERS) ×2 IMPLANT
CLIP TI MEDIUM 6 (CLIP) ×2 IMPLANT
CLIP TI WIDE RED SMALL 6 (CLIP) ×2 IMPLANT
CRADLE DONUT ADULT HEAD (MISCELLANEOUS) ×2 IMPLANT
DECANTER SPIKE VIAL GLASS SM (MISCELLANEOUS) IMPLANT
DERMABOND ADVANCED (GAUZE/BANDAGES/DRESSINGS) ×1
DERMABOND ADVANCED .7 DNX12 (GAUZE/BANDAGES/DRESSINGS) ×1 IMPLANT
DRAIN CHANNEL 10F 3/8 F FF (DRAIN) ×1 IMPLANT
DRAIN HEMOVAC 1/8 X 5 (WOUND CARE) IMPLANT
DRSG COVADERM 4X6 (GAUZE/BANDAGES/DRESSINGS) ×1 IMPLANT
ELECT REM PT RETURN 9FT ADLT (ELECTROSURGICAL) ×2
ELECTRODE REM PT RTRN 9FT ADLT (ELECTROSURGICAL) ×1 IMPLANT
EVACUATOR SILICONE 100CC (DRAIN) ×1 IMPLANT
GLOVE BIO SURGEON STRL SZ 6.5 (GLOVE) ×1 IMPLANT
GLOVE BIO SURGEON STRL SZ7 (GLOVE) ×1 IMPLANT
GLOVE BIO SURGEON STRL SZ7.5 (GLOVE) ×2 IMPLANT
GLOVE BIOGEL PI IND STRL 6.5 (GLOVE) IMPLANT
GLOVE BIOGEL PI IND STRL 7.0 (GLOVE) IMPLANT
GLOVE BIOGEL PI INDICATOR 6.5 (GLOVE) ×2
GLOVE BIOGEL PI INDICATOR 7.0 (GLOVE) ×3
GLOVE ECLIPSE 6.5 STRL STRAW (GLOVE) ×1 IMPLANT
GLOVE ECLIPSE 7.0 STRL STRAW (GLOVE) ×1 IMPLANT
GOWN STRL REUS W/ TWL LRG LVL3 (GOWN DISPOSABLE) ×3 IMPLANT
GOWN STRL REUS W/TWL LRG LVL3 (GOWN DISPOSABLE) ×10
HEMOSTAT SPONGE AVITENE ULTRA (HEMOSTASIS) IMPLANT
KIT BASIN OR (CUSTOM PROCEDURE TRAY) ×2 IMPLANT
KIT ROOM TURNOVER OR (KITS) ×2 IMPLANT
KIT SHUNT ARGYLE CAROTID ART 6 (VASCULAR PRODUCTS) ×1 IMPLANT
NDL HYPO 25GX1X1/2 BEV (NEEDLE) IMPLANT
NEEDLE HYPO 25GX1X1/2 BEV (NEEDLE) IMPLANT
NS IRRIG 1000ML POUR BTL (IV SOLUTION) ×4 IMPLANT
PACK CAROTID (CUSTOM PROCEDURE TRAY) ×2 IMPLANT
PAD ARMBOARD 7.5X6 YLW CONV (MISCELLANEOUS) ×4 IMPLANT
PATCH HEMASHIELD 8X150 (Vascular Products) ×1 IMPLANT
SHUNT CAROTID BYPASS 10 (VASCULAR PRODUCTS) IMPLANT
SHUNT CAROTID BYPASS 12FRX15.5 (VASCULAR PRODUCTS) IMPLANT
SUT ETHILON 3 0 PS 1 (SUTURE) ×1 IMPLANT
SUT PROLENE 6 0 CC (SUTURE) ×4 IMPLANT
SUT PROLENE 7 0 BV 1 (SUTURE) ×1 IMPLANT
SUT SILK 3 0 TIES 17X18 (SUTURE)
SUT SILK 3-0 18XBRD TIE BLK (SUTURE) IMPLANT
SUT VIC AB 3-0 SH 27 (SUTURE) ×2
SUT VIC AB 3-0 SH 27X BRD (SUTURE) ×1 IMPLANT
SUT VICRYL 4-0 PS2 18IN ABS (SUTURE) ×2 IMPLANT
SYR 20CC LL (SYRINGE) ×1 IMPLANT
SYR CONTROL 10ML LL (SYRINGE) IMPLANT
WATER STERILE IRR 1000ML POUR (IV SOLUTION) ×2 IMPLANT

## 2016-07-14 NOTE — H&P (Signed)
Referring Physician: Clayton Lefort, MD   Patient name: Yvonne Patel           MRN: [id number]        DOB: 1945-09-12          Sex: female   REASON FOR CONSULT: carotid stenosis   HPI: Yvonne Patel is a 71 y.o. female referred by Dr. Nadyne Coombes for evaluation of left internal carotid artery stenosis. 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 She is currently asymptomatic. She denies any symptoms of TIA amaurosis or stroke within the last year. The carotid stenosis was picked up on surveillance. This was confirmed on recent carotid angio at the time of cardiac cath.  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  Cardiac cath showed multivessel distal disease with occluded RCA and EF 35-45% medical management recommended.  The patient does has a history of peripheral arterial disease and has previously had lower extremity stenting and renal artery stenting by Dr. Nadyne Coombes. She did smoke in the past but quit about 30 years ago. She currently is able to walk 1-2 blocks without experiencing lower extremity symptoms. She is on aspirin Plavix and a statin.       Past Medical History:  Diagnosis Date  . Hyperlipidemia    . Hypertension    . Neuromuscular disorder (HCC)      neuropathy  . PAD (peripheral artery disease) ([place])    . Peripheral vascular disease ([place])    . Refusal of blood transfusions as patient is Jehovah's Witness    . Retinal disease    . Syncope    . TIA (transient ischemic attack) 2010; 09/2011    denies residual on 03/11/2015  . Type II diabetes mellitus ([place])           Past Surgical History:  Procedure Laterality Date  . DILATION AND CURETTAGE OF UTERUS      . FEMORAL ARTERY STENT      . LOWER EXTREMITY ANGIOGRAM N/A 06/05/2012    Procedure: LOWER EXTREMITY ANGIOGRAM;  Surgeon: [name], MD;  Location: [facility] CATH LAB;  Service: Cardiovascular;  Laterality: N/A;  . LOWER EXTREMITY ANGIOGRAM N/A 07/11/2012    Procedure:  LOWER EXTREMITY ANGIOGRAM;  Surgeon: [name], MD;  Location: [facility] CATH LAB;  Service: Cardiovascular;  Laterality: N/A;  . LOWER EXTREMITY ANGIOGRAM N/A 09/19/2012    Procedure: LOWER EXTREMITY ANGIOGRAM;  Surgeon: [name], MD;  Location: [facility] CATH LAB;  Service: Cardiovascular;  Laterality: N/A;  . PERIPHERAL VASCULAR CATHETERIZATION N/A 03/11/2015    Procedure: Renal Angiography;  Surgeon: [name], MD;  Location: [facility] CV LAB;  Service: Cardiovascular;  Laterality: N/A;  . RENAL ANGIOGRAM Left 06/05/2012    Procedure: RENAL ANGIOGRAM;  Surgeon: [name], MD;  Location: [facility] CATH LAB;  Service: Cardiovascular;  Laterality: Left;  . RENAL ARTERY ANGIOPLASTY Right 03/11/2015  . TUBAL LIGATION      . VAGINAL HYSTERECTOMY          No family history on file.   SOCIAL HISTORY: Social History         Social History  . Marital status: Divorced      Spouse name: N/A  . Number of children: N/A  . Years of education: N/A       Occupational History  . Not on file.          Social  History Main Topics  . Smoking status: Former Smoker      Packs/day: 0.50      Years: 10.00      Types: Cigarettes      Start date: 03/28/1965      Quit date: 03/31/1975  . Smokeless tobacco: Never Used  . Alcohol use Yes        Comment: 03/11/2015 'drink at gatherings a couple times/year"  . Drug use: No  . Sexual activity: No        Other Topics Concern  . Not on file       Social History Narrative  . No narrative on file      No Known Allergies         Current Outpatient Prescriptions  Medication Sig Dispense Refill  . amLODipine (NORVASC) 10 MG tablet Take 10 mg by mouth daily.      Marland Kitchen aspirin EC 81 MG tablet Take 81 mg by mouth daily.      Marland Kitchen atorvastatin (LIPITOR) 40 MG tablet Take 40 mg by mouth daily.      . clopidogrel (PLAVIX) 75 MG tablet Take 75 mg by mouth daily.      . insulin detemir (LEVEMIR) 100 UNIT/ML injection Inject 34 Units into the skin at bedtime.  Add 2 units if over 150      . labetalol (NORMODYNE) 200 MG tablet Take 200 mg by mouth 2 (two) times daily.      Marland Kitchen losartan-hydrochlorothiazide (HYZAAR) 100-25 MG tablet Take 1 tablet by mouth daily.      . metFORMIN (GLUCOPHAGE) 500 MG tablet Take 1,000 mg by mouth 2 (two) times daily.      . timolol (TIMOPTIC-XR) 0.5 % ophthalmic gel-forming Place 1 drop into both eyes daily.        No current facility-administered medications for this visit.       ROS:    General:  No weight loss, Fever, chills   HEENT: No recent headaches, no nasal bleeding, no visual changes, no sore throat   Neurologic: No dizziness, blackouts, seizures. No recent symptoms of stroke or mini- stroke. No recent episodes of slurred speech, or temporary blindness.   Cardiac: No recent episodes of chest pain/pressure, no shortness of breath at rest.  No shortness of breath with exertion.  Denies history of atrial fibrillation or irregular heartbeat   Vascular: No history of rest pain in feet.  + history of claudication.  No history of non-healing ulcer, No history of DVT    Pulmonary: No home oxygen, no productive cough, no hemoptysis,  No asthma or wheezing   Musculoskeletal:  [ ]  Arthritis, [ ]  Low back pain,  [ ]  Joint pain   Hematologic:No history of hypercoagulable state.  No history of easy bleeding.  No history of anemia   Gastrointestinal: No hematochezia or melena,  No gastroesophageal reflux, no trouble swallowing   Urinary: [ ]  chronic Kidney disease, [ ]  on HD - [ ]  MWF or [ ]  TTHS, [ ]  Burning with urination, [ ]  Frequent urination, [ ]  Difficulty urinating;    Skin: No rashes   Psychological: No history of anxiety,  No history of depression     Physical Examination    Vitals:   07/14/16 0724  BP: 121/62  Pulse: 60  Resp: 20  Temp: 97.7 F (36.5 C)  TempSrc: Oral  SpO2: 100%   General:  Alert and oriented, no acute distress HEENT: Normal Neck: Soft left carotid bruit Pulmonary: Clear  to auscultation bilaterally Cardiac: Regular Rate and Rhythm without murmur Abdomen: Soft, non-tender, non-distended, no mass Skin: No rash Extremity Pulses:  2+ radial, brachial, femoral pulses bilaterally Musculoskeletal: No deformity or edema      Neurologic: Upper and lower extremity motor 5/5 and symmetric   DATA:  Carotid duplex exam performed 6 weeks ago showed on the left side 60-80% stenosis but this was a difficult study due to calcific plaque and tortuosity. Right side was 40-60%. The patient had bilateral external carotid artery high-grade stenosis. She also had findings suggestive of right subclavian stenosis.   ASSESSMENT: #1 chronic medical problems including elevated cholesterol hypertension and diabetes are all currently stable.   #2 left internal carotid artery stenosis our duplex exam today did not show is high velocity but the previous duplex exam at Dr. Mila Palmer office showed a very high velocity consistent with high-grade carotid stenosis. I believe the patient will need a carotid angiogram to define which of these studies is more accurate. If the patient truly has greater than 80% stenosis on her carotid angiogram we will proceed with carotid endarterectomy in the near future. Risks benefits possible complications and procedure details a carotid angiogram were explained the patient and her daughter today. These include but are not limited to bleeding infection stroke risk of 1-2%. She wishes to proceed.   #3 peripheral arterial disease bilateral common femoral and superficial femoral artery occlusive disease by recent duplex exam in Dr. Mila Palmer office. Her overall symptoms have been stable and are followed by Dr. Nadyne Coombes.   #4 Renal vascular hypertension she did have some evidence of recurrent stenosis is also followed by Dr. Nadyne Coombes.   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 She needs left side aline    PLAN:  Arch and  carotid angiogram scheduled for 06/11/2016.   [name], MD [facility] Office: [phone] Pager: [phone]

## 2016-07-14 NOTE — Progress Notes (Signed)
  [facility] Day of Surgery Note  Subjective:  Sleepy, but does follow commands.   Vitals:   07/14/16 1432 07/14/16 1445  BP: (!) 134/46   Pulse: 74 76  Resp: (!) 9   Temp:  98.5 F (36.9 C)   Left neck without hematoma. JP drain with scant drainage. Slight left marginal mandibular neuropraxia. Tongue midline.  5/5 strength upper and lower extremities bilaterally.    Assessment/Plan:  This is a 71 y.o. female who is s/p left carotid endarterectomy  Neuro exam intact except for slight left marginal mandibular neuropraxia.  BP stable.  Hold pain meds until patient more alert.    Virgina Jock, [place] Pager: [phone] 07/14/2016 3:32 PM

## 2016-07-14 NOTE — Anesthesia Procedure Notes (Signed)
Procedure Name: Intubation Date/Time: 07/14/2016 8:49 AM Performed by: Ollen Bowl Pre-anesthesia Checklist: Patient identified, Emergency Drugs available, Suction available, Patient being monitored and Timeout performed Patient Re-evaluated:Patient Re-evaluated prior to inductionOxygen Delivery Method: Circle system utilized and Simple face mask Preoxygenation: Pre-oxygenation with 100% oxygen Intubation Type: IV induction Ventilation: Mask ventilation without difficulty Laryngoscope Size: Miller and 2 Grade View: Grade I Tube type: Oral Tube size: 7.5 mm Number of attempts: 1 Airway Equipment and Method: Patient positioned with wedge pillow and Stylet Placement Confirmation: ETT inserted through vocal cords under direct vision,  positive ETCO2 and breath sounds checked- equal and bilateral Secured at: 22 cm Tube secured with: Tape Dental Injury: Teeth and Oropharynx as per pre-operative assessment

## 2016-07-14 NOTE — Anesthesia Preprocedure Evaluation (Addendum)
Anesthesia Evaluation  Patient identified by MRN, date of birth, ID band Patient awake    Reviewed: Allergy & Precautions, NPO status , Patient's Chart, lab work & pertinent test results, reviewed documented beta blocker date and time   Airway Mallampati: I  TM Distance: >3 FB Neck ROM: Full    Dental  (+) Edentulous Lower, Edentulous Upper, Dental Advisory Given   Pulmonary former smoker,    Pulmonary exam normal        Cardiovascular hypertension, Pt. on medications + CAD  Normal cardiovascular exam     Neuro/Psych    GI/Hepatic   Endo/Other  diabetes  Renal/GU      Musculoskeletal   Abdominal   Peds  Hematology   Anesthesia Other Findings   Reproductive/Obstetrics                            Anesthesia Physical Anesthesia Plan  ASA: III  Anesthesia Plan: General   Post-op Pain Management:    Induction: Intravenous  Airway Management Planned: Oral ETT  Additional Equipment:   Intra-op Plan:   Post-operative Plan: Extubation in OR  Informed Consent: I have reviewed the patients History and Physical, chart, labs and discussed the procedure including the risks, benefits and alternatives for the proposed anesthesia with the patient or authorized representative who has indicated his/her understanding and acceptance.     Plan Discussed with: CRNA and Surgeon  Anesthesia Plan Comments:         Anesthesia Quick Evaluation

## 2016-07-14 NOTE — Op Note (Signed)
Procedure Left carotid endarterectomy  Preoperative diagnosis: Asymptomatic left internal carotid artery stenosis  Postoperative diagnosis: Same  Anesthesia General  Asst.: Gerri Lins, [facility]  Operative findings: #1 greater than 90% left internal carotid stenosis heavily calcified extending about 5 cm into the common carotid, high left carotid bifurcation                                #2long  Dacron patch extending about 7-8 cm          #3 long 10 Fr shunt  Operative details: After obtaining informed consent, the patient was taken to the operating room. The patient was placed in a supine position on the operating room table. After induction of general anesthesia the patient's entire neck and chest was prepped and draped in the usual sterile fashion. An oblique incision was made on the left aspect of the patient's neck anterior to the border the left sternocleidomastoid muscle. The incision was carried into the subcutaneous tissues and through the platysma. The sternocleidomastoid muscle was identified and reflected laterally.  The common carotid artery was then found at the base of the incision this was dissected free circumferentially.  It was severely circumferentially calcified and I had to divide the omohyoid and dissect out several centimeters below the omohyoid to find a spot were it was fairly soft on palpation.  The vagus nerve was identified and protected. Dissection was then carried up to the level carotid bifurcation.   The pt had a high carotid bifurcation.  I did ligate and divide several venous and arterial branches over the distal internal carotid to mobilize the hypoglossal nerve. It was identified and protected.  The internal carotid artery was dissected free circumferentially just below the level of the hypoglossal nerve and it was soft in character at this location and above any palpable disease. A vessel loop was placed around this. Next the external carotid and superior thyroid  arteries were dissected free circumferentially and vessel loops were placed around these. The patient was given 8000 units of intravenous heparin and an additional 5000 units of heparin during the case.  After 2 minutes of circulation time and raising the mean arterial pressure to 90 mm mercury, the distal internal carotid artery was controlled with a kitzmiller clamp. The external carotid and superior thyroid arteries were controlled with vessel loops. The common carotid artery was controlled with a peripheral DeBakey clamp. A longitudinal opening was made in the common carotid artery just below the bifurcation. The arteriotomy was extended distally up into the internal carotid with Potts scissors. There was a large calcified plaque with greater than 90% stenosis in the internal carotid.  A 10 Fr shunt was brought onto the field and fashioned to fit the patient's artery.  A long shunt was used due to the extensive length of the plaque.  This was threaded into the distal internal carotid artery and allowed to backbleed thoroughly.  There was pulsatile backbleeding.  This was then threaded into the common carotid and secured with a Rummel tourniquet.   There was no air at this point and flow was restored to the brain.  Attention was then turned to the common carotid artery once again. A suitable endarterectomy plane was obtained and endarterectomy was begun in the common carotid artery and a good proximal endpoint was obtained. An eversion endarterectomy was performed on the external carotid artery and a good endpoint was obtained.  This  again was severely calcified. The plaque was then elevated in the internal carotid artery and a nice feathered distal endpoint was also obtained.  The plaque was passed off the table. All loose debris was then removed from the carotid bed and everything was thoroughly irrigated with heparinized saline. A  Long Dacron patch was then brought on to the operative field and this was sewn  on as a patch angioplasty using a running 6-0 Prolene suture. The patch was about 7-8 cm in length.  The shunt was occluded and removed.  The arteries were again controlled sequentially internal followed by common carotid.  Prior to completion of the anastomosis the internal carotid artery was thoroughly backbled. This was then controlled again with a fine bulldog clamp.  The common carotid was thoroughly flushed forward. The external carotid was also thoroughly backbled.  The remainder of the patch was completed and the anastomosis was secured. Flow was then restored first retrograde from the external carotid into the carotid bed then antegrade from the common carotid to the external carotid artery and after approximately 5 cardiac cycles to the internal carotid artery. Doppler was used to evaluate the external/internal and common carotid arteries and these all had good Doppler flow. Hemostasis was obtained with avitene and direct pressure. The patient was also given 50 mg of Protamine.  There was inititial diffuse needle hold bleeding thought to be due to the patient's Plavix and aspirin which did finally stop with direct pressure after about 30 minutes.  A 10 flat JP drain was brought out through a separate stab incision at  Base of the neck and secured with a nylon stitch.    The platysma muscle was reapproximated using a running 3-0 Vicryl suture. The skin was closed with 4 0 Vicryl subcuticular stitch.  The patient was awakened in the operating room and was moving upper and lower extremities symmetrically and following commands.  The patient was stable on arrival to the PACU.  [name], MD [facility] [place] Office: [phone] Pager: [phone]

## 2016-07-14 NOTE — Transfer of Care (Signed)
Immediate Anesthesia Transfer of Care Note  Patient: Yvonne Patel  Procedure(s) Performed: Procedure(s): ENDARTERECTOMY CAROTID LEFT (Left) PATCH ANGIOPLASTY (Left)  Patient Location: PACU  Anesthesia Type:General  Level of Consciousness: awake, alert  and oriented  Airway & Oxygen Therapy: Patient Spontanous Breathing and Patient connected to nasal cannula oxygen  Post-op Assessment: Report given to RN, Post -op Vital signs reviewed and stable and Patient moving all extremities X 4  Post vital signs: Reviewed and stable  Last Vitals:  Vitals:   07/14/16 0724 07/14/16 1318  BP: 121/62   Pulse: 60   Resp: 20   Temp: 36.5 C (P) 37.1 C    Last Pain:  Vitals:   07/14/16 0724  TempSrc: Oral         Complications: No apparent anesthesia complications

## 2016-07-14 NOTE — Anesthesia Postprocedure Evaluation (Signed)
Anesthesia Post Note  Patient: Yvonne Patel  Procedure(s) Performed: Procedure(s) (LRB): ENDARTERECTOMY CAROTID LEFT (Left) PATCH ANGIOPLASTY (Left)  Patient location during evaluation: PACU Anesthesia Type: General Level of consciousness: awake and alert Pain management: pain level controlled Vital Signs Assessment: post-procedure vital signs reviewed and stable Respiratory status: spontaneous breathing, nonlabored ventilation, respiratory function stable and patient connected to nasal cannula oxygen Cardiovascular status: blood pressure returned to baseline and stable Postop Assessment: no signs of nausea or vomiting Anesthetic complications: no       Last Vitals:  Vitals:   07/14/16 1500 07/14/16 1600  BP: (!) 126/53 (!) 133/43  Pulse: 67 64  Resp: 12 (!) 8  Temp: 37 C     Last Pain:  Vitals:   07/14/16 1500  TempSrc: Oral  PainSc:                  Jewett Mcgann DAVID

## 2016-07-15 ENCOUNTER — Encounter (HOSPITAL_COMMUNITY): Payer: Self-pay | Admitting: Vascular Surgery

## 2016-07-15 LAB — BASIC METABOLIC PANEL
Anion gap: 9 (ref 5–15)
BUN: 27 mg/dL — AB (ref 6–20)
CALCIUM: 8.4 mg/dL — AB (ref 8.9–10.3)
CHLORIDE: 109 mmol/L (ref 101–111)
CO2: 22 mmol/L (ref 22–32)
Creatinine, Ser: 1.44 mg/dL — ABNORMAL HIGH (ref 0.44–1.00)
GFR calc non Af Amer: 36 mL/min — ABNORMAL LOW (ref >60)
GFR, EST AFRICAN AMERICAN: 41 mL/min — AB (ref >60)
GLUCOSE: 103 mg/dL — AB (ref 65–99)
Potassium: 3.7 mmol/L (ref 3.5–5.1)
Sodium: 140 mmol/L (ref 135–145)

## 2016-07-15 LAB — CBC
HEMATOCRIT: 24.6 % — AB (ref 36.0–46.0)
Hemoglobin: 8.1 g/dL — ABNORMAL LOW (ref 12.0–15.0)
MCH: 30.2 pg (ref 26.0–34.0)
MCHC: 32.9 g/dL (ref 30.0–36.0)
MCV: 91.8 fL (ref 78.0–100.0)
Platelets: 109 10*3/uL — ABNORMAL LOW (ref 150–400)
RBC: 2.68 MIL/uL — ABNORMAL LOW (ref 3.87–5.11)
RDW: 13.1 % (ref 11.5–15.5)
WBC: 8.4 10*3/uL (ref 4.0–10.5)

## 2016-07-15 LAB — GLUCOSE, CAPILLARY
GLUCOSE-CAPILLARY: 139 mg/dL — AB (ref 65–99)
Glucose-Capillary: 142 mg/dL — ABNORMAL HIGH (ref 65–99)
Glucose-Capillary: 109 mg/dL — ABNORMAL HIGH (ref 65–99)

## 2016-07-15 MED ORDER — OXYCODONE-ACETAMINOPHEN 5-325 MG PO TABS: 1.0000 | ORAL_TABLET | Freq: Four times a day (QID) | ORAL | 0 refills | Status: DC | PRN

## 2016-07-15 NOTE — Progress Notes (Signed)
   07/15/16 1400  Vitals  BP (!) 95/49  MAP (mmHg) (!) 64  Pulse Rate 64  ECG Heart Rate 64  Resp 13  Oxygen Therapy  SpO2 94 %

## 2016-07-15 NOTE — Progress Notes (Signed)
   07/15/16 1348  Vitals  BP (!) 88/52  MAP (mmHg) (!) 62  ECG Heart Rate 64  Repeat vitals

## 2016-07-15 NOTE — Progress Notes (Signed)
Patient ambulated with 2 people assist, denies dizziness or SOB.

## 2016-07-15 NOTE — Progress Notes (Addendum)
[facility]  Subjective  - Doing well.   Objective (!) 108/45 69 98.8 F (37.1 C) (Oral) (!) 5 99%  Intake/Output Summary (Last 24 hours) at 07/15/16 0747 Last data filed at 07/15/16 0400  Gross per 24 hour  Intake          3011.25 ml  Output             1135 ml  Net          1876.25 ml    Palpable radial pulses B Left neck incision clean and dry without hematoma Drain 35 total watery Lungs non labored breathing  Assessment/Planning: POD # 1 left CEA  Plan to discharge drain D/C home today after she ambulates and is tolerating breakfast. F/U in 2-3 weeks with Dr. Carvel Getting, [facility] 07/15/2016 7:47 AM -- No hematoma Neuro intact D/c home  [name], MD [facility] [place]: [phone] Pager: [phone]  Laboratory Lab Results:  Recent Labs  07/14/16 1600 07/15/16 0424  WBC 9.3 8.4  HGB 9.2* 8.1*  HCT 27.9* 24.6*  PLT 125* 109*   BMET  Recent Labs  07/14/16 1600 07/15/16 0424  NA  --  140  K  --  3.7  CL  --  109  CO2  --  22  GLUCOSE  --  103*  BUN  --  27*  CREATININE 1.31* 1.44*  CALCIUM  --  8.4*    COAG Lab Results  Component Value Date   INR 0.98 07/01/2016   INR 1.03 02/17/2009   INR 1.0 11/13/2008   No results found for: PTT

## 2016-07-15 NOTE — Progress Notes (Addendum)
JP drain DC per order. Discharge instructions given to both patient and patient daughter. Verbalized understanding. No bleeding noted after drain removal Patient walked to bathroom using walker without dizziness.

## 2016-07-15 NOTE — Progress Notes (Addendum)
   07/15/16 1346  Vitals  BP (!) 66/47  MAP (mmHg) (!) 54  ECG Heart Rate 63  Attempted to ambulated patiet but c/o dizziness and almost passed out. Returned to bed and checked vital.(see above) MD paged to be informed. Pablo Ledger  PA  Returned page

## 2016-07-15 NOTE — Care Management Note (Signed)
Case Management Note  Patient Details  Name: Yvonne Patel MRN: [id number] Date of Birth: Sep 15, 1945  Subjective/Objective:     POD # 1 left CEA.              Action/Plan: NCM will follow for dc needs.  Expected Discharge Date:  07/15/16               Expected Discharge Plan:  Home/Self Care  In-House Referral:     Discharge planning Services  CM Consult  Post Acute Care Choice:    Choice offered to:     DME Arranged:    DME Agency:     HH Arranged:    HH Agency:     Status of Service:  Completed, signed off  If discussed at [employer] of Stay Meetings, dates discussed:    Additional Comments:  [name], RN 07/15/2016, 9:55 AM

## 2016-07-16 ENCOUNTER — Telehealth: Payer: Self-pay | Admitting: Vascular Surgery

## 2016-07-16 LAB — HEMOGLOBIN A1C
HEMOGLOBIN A1C: 6.6 % — AB (ref 4.8–5.6)
MEAN PLASMA GLUCOSE: 143 mg/dL

## 2016-07-16 NOTE — Telephone Encounter (Signed)
-----   Message from Mena Goes, RN sent at 07/15/2016 12:55 PM EDT ----- Regarding: 2-3 weeks   ----- Message ----- From: Ulyses Amor, PA-C Sent: 07/15/2016   7:52 AM To: [facility] Charge Pool  S/P left CEA f/u with Dr. Oneida Alar in 2-3 weeks

## 2016-07-16 NOTE — Telephone Encounter (Signed)
spoke to pt for appt, also verified address, will mail letter for appt on 5/31

## 2016-07-17 ENCOUNTER — Encounter (HOSPITAL_COMMUNITY): Payer: Self-pay | Admitting: Emergency Medicine

## 2016-07-17 ENCOUNTER — Inpatient Hospital Stay (HOSPITAL_COMMUNITY)
Admission: EM | Admit: 2016-07-17 | Discharge: 2016-07-24 | DRG: 193 | Disposition: A | Discharge status: Home Health | Payer: PPO | Attending: Family Medicine | Admitting: Family Medicine

## 2016-07-17 ENCOUNTER — Telehealth: Payer: Self-pay | Admitting: Vascular Surgery

## 2016-07-17 DIAGNOSIS — Z8673 Personal history of transient ischemic attack (TIA), and cerebral infarction without residual deficits: Secondary | ICD-10-CM

## 2016-07-17 DIAGNOSIS — R1310 Dysphagia, unspecified: Secondary | ICD-10-CM | POA: Diagnosis present

## 2016-07-17 DIAGNOSIS — R931 Abnormal findings on diagnostic imaging of heart and coronary circulation: Secondary | ICD-10-CM | POA: Diagnosis not present

## 2016-07-17 DIAGNOSIS — Z531 Procedure and treatment not carried out because of patient's decision for reasons of belief and group pressure: Secondary | ICD-10-CM | POA: Diagnosis present

## 2016-07-17 DIAGNOSIS — I739 Peripheral vascular disease, unspecified: Secondary | ICD-10-CM | POA: Diagnosis present

## 2016-07-17 DIAGNOSIS — N183 Chronic kidney disease, stage 3 unspecified: Secondary | ICD-10-CM

## 2016-07-17 DIAGNOSIS — E119 Type 2 diabetes mellitus without complications: Secondary | ICD-10-CM

## 2016-07-17 DIAGNOSIS — I2510 Atherosclerotic heart disease of native coronary artery without angina pectoris: Secondary | ICD-10-CM | POA: Diagnosis present

## 2016-07-17 DIAGNOSIS — E785 Hyperlipidemia, unspecified: Secondary | ICD-10-CM | POA: Diagnosis present

## 2016-07-17 DIAGNOSIS — E1151 Type 2 diabetes mellitus with diabetic peripheral angiopathy without gangrene: Secondary | ICD-10-CM | POA: Diagnosis not present

## 2016-07-17 DIAGNOSIS — E1122 Type 2 diabetes mellitus with diabetic chronic kidney disease: Secondary | ICD-10-CM | POA: Diagnosis not present

## 2016-07-17 DIAGNOSIS — I255 Ischemic cardiomyopathy: Secondary | ICD-10-CM | POA: Diagnosis present

## 2016-07-17 DIAGNOSIS — I130 Hypertensive heart and chronic kidney disease with heart failure and stage 1 through stage 4 chronic kidney disease, or unspecified chronic kidney disease: Secondary | ICD-10-CM | POA: Diagnosis present

## 2016-07-17 DIAGNOSIS — R410 Disorientation, unspecified: Secondary | ICD-10-CM

## 2016-07-17 DIAGNOSIS — Y95 Nosocomial condition: Secondary | ICD-10-CM | POA: Diagnosis not present

## 2016-07-17 DIAGNOSIS — I5033 Acute on chronic diastolic (congestive) heart failure: Secondary | ICD-10-CM | POA: Diagnosis not present

## 2016-07-17 DIAGNOSIS — R778 Other specified abnormalities of plasma proteins: Secondary | ICD-10-CM | POA: Diagnosis present

## 2016-07-17 DIAGNOSIS — R799 Abnormal finding of blood chemistry, unspecified: Secondary | ICD-10-CM | POA: Diagnosis not present

## 2016-07-17 DIAGNOSIS — R4182 Altered mental status, unspecified: Secondary | ICD-10-CM

## 2016-07-17 DIAGNOSIS — D508 Other iron deficiency anemias: Secondary | ICD-10-CM | POA: Diagnosis not present

## 2016-07-17 DIAGNOSIS — I959 Hypotension, unspecified: Secondary | ICD-10-CM | POA: Diagnosis not present

## 2016-07-17 DIAGNOSIS — E118 Type 2 diabetes mellitus with unspecified complications: Secondary | ICD-10-CM

## 2016-07-17 DIAGNOSIS — R0603 Acute respiratory distress: Secondary | ICD-10-CM | POA: Diagnosis not present

## 2016-07-17 DIAGNOSIS — I10 Essential (primary) hypertension: Secondary | ICD-10-CM | POA: Diagnosis present

## 2016-07-17 DIAGNOSIS — D638 Anemia in other chronic diseases classified elsewhere: Secondary | ICD-10-CM | POA: Diagnosis not present

## 2016-07-17 DIAGNOSIS — R7989 Other specified abnormal findings of blood chemistry: Secondary | ICD-10-CM

## 2016-07-17 DIAGNOSIS — E0800 Diabetes mellitus due to underlying condition with hyperosmolarity without nonketotic hyperglycemic-hyperosmolar coma (NKHHC): Secondary | ICD-10-CM | POA: Diagnosis not present

## 2016-07-17 DIAGNOSIS — J189 Pneumonia, unspecified organism: Principal | ICD-10-CM | POA: Diagnosis present

## 2016-07-17 DIAGNOSIS — Z87891 Personal history of nicotine dependence: Secondary | ICD-10-CM | POA: Diagnosis not present

## 2016-07-17 DIAGNOSIS — E1140 Type 2 diabetes mellitus with diabetic neuropathy, unspecified: Secondary | ICD-10-CM | POA: Diagnosis present

## 2016-07-17 DIAGNOSIS — R509 Fever, unspecified: Secondary | ICD-10-CM

## 2016-07-17 DIAGNOSIS — D509 Iron deficiency anemia, unspecified: Secondary | ICD-10-CM | POA: Diagnosis present

## 2016-07-17 DIAGNOSIS — E876 Hypokalemia: Secondary | ICD-10-CM | POA: Diagnosis not present

## 2016-07-17 DIAGNOSIS — R0600 Dyspnea, unspecified: Secondary | ICD-10-CM

## 2016-07-17 DIAGNOSIS — R400 Somnolence: Secondary | ICD-10-CM | POA: Diagnosis not present

## 2016-07-17 DIAGNOSIS — R9431 Abnormal electrocardiogram [ECG] [EKG]: Secondary | ICD-10-CM

## 2016-07-17 DIAGNOSIS — Z90710 Acquired absence of both cervix and uterus: Secondary | ICD-10-CM | POA: Diagnosis not present

## 2016-07-17 DIAGNOSIS — Z95820 Peripheral vascular angioplasty status with implants and grafts: Secondary | ICD-10-CM | POA: Diagnosis not present

## 2016-07-17 DIAGNOSIS — R404 Transient alteration of awareness: Secondary | ICD-10-CM | POA: Diagnosis not present

## 2016-07-17 DIAGNOSIS — R748 Abnormal levels of other serum enzymes: Secondary | ICD-10-CM | POA: Diagnosis not present

## 2016-07-17 LAB — CBG MONITORING, ED: GLUCOSE-CAPILLARY: 94 mg/dL (ref 65–99)

## 2016-07-17 MED ORDER — SODIUM CHLORIDE 0.9 % IV BOLUS (SEPSIS)
1000.0000 mL | Freq: Once | INTRAVENOUS | Status: AC
  Administered 2016-07-18: 1000 mL via INTRAVENOUS

## 2016-07-17 NOTE — ED Notes (Signed)
Pt had a carotid endarterectomy on Wednesday. Since then the pt's daughter stated the pt has been weak, lack of appetite, not responsive and not making much sense. Today, she was doing much better, but after taking medications she was the same as before.

## 2016-07-17 NOTE — ED Provider Notes (Signed)
[facility] DEPT Provider Note   CSN: [id number] Arrival date & time: 07/17/16  2253  By signing my name below, I, [name], attest that this documentation has been prepared under the direction and in the presence of [name], MD. Electronically Signed: [name], Scribe. 07/18/16. 3:36 AM.   History   Chief Complaint Chief Complaint  Patient presents with  . Altered Mental Status    HPI Yvonne Patel is a 71 y.o. female with history of HTN, HLD, diabetes, PAD, and CAD requiring carotid endarterectomy on 5/9 who presents to the ED with multiple complaints. The patient states that since her surgery 3 days ago she has been globally weak and less conversational as well as decreased appetite. In the last 24 hours she has also developed a constant headache without any particularly modifying factors. She has not been taking fluids or food at home in her usual amount. No neck pain or fever currently. The family brought the patient to the emergency department after she became confused with subjective slurred speech; made nonsensical statement at home. She is reporting a dry cough at interview. No nausea or vomiting. No abdominal pain. No urinary complaints including dysuria or frequency.  The history is provided by the patient. No language interpreter was used.  Altered Mental Status   This is a new problem. The current episode started more than 2 days ago. The problem has not changed since onset.Associated symptoms include confusion and weakness (global).    Past Medical History:  Diagnosis Date  . Cardiomyopathy ([place])   . Coronary artery disease   . Hyperlipidemia   . Hypertension   . Neuromuscular disorder ([facility])    neuropathy  . PAD (peripheral artery disease) ([place])   . Peripheral vascular disease ([place])   . Refusal of blood transfusions as patient is Jehovah's Witness   . Retinal disease   . Syncope   . TIA (transient ischemic attack) 2010; 09/2011   denies  residual on 03/11/2015  . Type II diabetes mellitus [facility])     Patient Active Problem List   Diagnosis Date Noted  . Altered mental status 07/18/2016  . Carotid stenosis 07/14/2016  . Abnormal stress test 06/21/2016  . 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 H/O: CVA (cerebrovascular accident) 09/30/2011  . URINALYSIS, ABNORMAL 11/28/2009  . MICROALBUMINURIA 08/26/2009  . ANEMIA, IRON DEFICIENCY 12/29/2008  . ANEMIA, FOLIC ACID DEFICIENCY 59/16/3846  . DIABETIC MACULAR EDEMA 12/11/2008  . PERIPHERAL VASCULAR DISEASE 10/01/2008  . KNEE PAIN, RIGHT 05/07/2008  . NOCTURIA 05/07/2008  . CAROTID BRUITS, BILATERAL 12/13/2007  . DIABETES MELLITUS, TYPE II, UNCONTROLLED 11/02/2007  . DIABETIC PERIPHERAL NEUROPATHY 11/02/2007  . HYPERLIPIDEMIA 11/02/2007  . ESSENTIAL HYPERTENSION 11/02/2007    Past Surgical History:  Procedure Laterality Date  . AORTIC ARCH ANGIOGRAPHY N/A 06/22/2016   Procedure: Aortic Arch Angiography;  Surgeon: [name], MD;  Location: [facility] CV LAB;  Service: Cardiovascular;  Laterality: N/A;  . CAROTID ANGIOGRAPHY N/A 06/22/2016   Procedure: Carotid Angiography;  Surgeon: [name], MD;  Location: [place] CV LAB;  Service: Cardiovascular;  Laterality: N/A;  . DILATION AND CURETTAGE OF UTERUS    . ENDARTERECTOMY Left 07/14/2016   Procedure: ENDARTERECTOMY CAROTID LEFT;  Surgeon: Elam Dutch, MD;  Location: [facility];  Service: Vascular;  Laterality: Left;  . FEMORAL ARTERY STENT    . LEFT HEART CATH AND CORONARY ANGIOGRAPHY N/A 06/22/2016   Procedure: Left Heart Cath and Coronary Angiography;  Surgeon: [name], MD;  Location: [facility]  INVASIVE CV LAB;  Service: Cardiovascular;  Laterality: N/A;  . LOWER EXTREMITY ANGIOGRAM N/A 06/05/2012   Procedure: LOWER EXTREMITY ANGIOGRAM;  Surgeon: [name], MD;  Location: [facility] CATH LAB;  Service: Cardiovascular;  Laterality: N/A;  . LOWER EXTREMITY ANGIOGRAM N/A 07/11/2012   Procedure: LOWER EXTREMITY ANGIOGRAM;   Surgeon: [name], MD;  Location: [facility] CATH LAB;  Service: Cardiovascular;  Laterality: N/A;  . LOWER EXTREMITY ANGIOGRAM N/A 09/19/2012   Procedure: LOWER EXTREMITY ANGIOGRAM;  Surgeon: [name], MD;  Location: [facility] CATH LAB;  Service: Cardiovascular;  Laterality: N/A;  . PATCH ANGIOPLASTY Left 07/14/2016   Procedure: PATCH ANGIOPLASTY;  Surgeon: Elam Dutch, MD;  Location: [place];  Service: Vascular;  Laterality: Left;  . PERIPHERAL VASCULAR CATHETERIZATION N/A 03/11/2015   Procedure: Renal Angiography;  Surgeon: [name], MD;  Location: [place] CV LAB;  Service: Cardiovascular;  Laterality: N/A;  . RENAL ANGIOGRAM Left 06/05/2012   Procedure: RENAL ANGIOGRAM;  Surgeon: [name], MD;  Location: [facility] CATH LAB;  Service: Cardiovascular;  Laterality: Left;  . RENAL ANGIOGRAPHY  06/22/2016   Procedure: Renal Angiography;  Surgeon: [name], MD;  Location: [place] CV LAB;  Service: Cardiovascular;;  . RENAL ARTERY ANGIOPLASTY Right 03/11/2015  . TUBAL LIGATION    . VAGINAL HYSTERECTOMY      OB History    No data available       Home Medications    Prior to Admission medications   Medication Sig Start Date End Date Taking? Authorizing Provider  amLODipine (NORVASC) 10 MG tablet Take 10 mg by mouth daily at 12 noon.    Yes [provider]  aspirin EC 81 MG tablet Take 81 mg by mouth at bedtime.    Yes [provider]  atorvastatin (LIPITOR) 80 MG tablet Take 80 mg by mouth daily at 6 PM.  06/01/16  Yes [provider]  clopidogrel (PLAVIX) 75 MG tablet Take 75 mg by mouth at bedtime.    Yes [provider]  ezetimibe (ZETIA) 10 MG tablet Take 10 mg by mouth every evening. 06/01/16  Yes [provider]  insulin degludec (TRESIBA FLEXTOUCH) 100 UNIT/ML SOPN FlexTouch Pen Inject 30 Units into the skin daily.   Yes [provider]  labetalol (NORMODYNE) 200 MG tablet Take 200 mg by mouth 2 (two) times daily.    Yes  [provider]  losartan-hydrochlorothiazide (HYZAAR) 100-25 MG tablet Take 1 tablet by mouth every evening.  12/10/14  Yes [provider]  metFORMIN (GLUCOPHAGE) 500 MG tablet Take 2 tablets (1,000 mg total) by mouth 2 (two) times daily. 06/24/16  Yes [name], MD  oxyCODONE-acetaminophen (PERCOCET/ROXICET) 5-325 MG tablet Take 1 tablet by mouth every 6 (six) hours as needed for moderate pain. 07/15/16  Yes Laurence Slate M, PA-C  prednisoLONE acetate (PRED FORTE) 1 % ophthalmic suspension Place 1 drop into the left eye 4 (four) times daily.   Yes [provider]  Vitamin D, Ergocalciferol, (DRISDOL) 50000 units CAPS capsule Take 50,000 Units by mouth 2 (two) times a week. Sunday and Thursday 04/16/16  Yes [provider]    Family History History reviewed. No pertinent family history.  Social History Social History  Substance Use Topics  . Smoking status: Former Smoker    Packs/day: 0.50    Years: 10.00    Types: Cigarettes    Start date: 03/28/1965    Quit date: 03/31/1975  . Smokeless tobacco: Never Used  .  Alcohol use Yes     Comment: 03/11/2015 'drink at gatherings a couple times/year"     Allergies   No known allergies   Review of Systems Review of Systems  Constitutional: Negative for chills and fever.  HENT: Negative for congestion and rhinorrhea.   Eyes: Negative for redness and visual disturbance.  Respiratory: Negative for shortness of breath and wheezing.   Cardiovascular: Negative for chest pain and palpitations.  Gastrointestinal: Negative for nausea and vomiting.  Genitourinary: Negative for dysuria and urgency.  Musculoskeletal: Negative for arthralgias and myalgias.  Skin: Negative for pallor and wound.  Neurological: Positive for weakness (global). Negative for dizziness and headaches.  Psychiatric/Behavioral: Positive for confusion.     Physical Exam Updated Vital Signs BP (!) 143/84   Pulse 63   Temp 98 F (36.7 C)  (Oral)   Resp 13   Ht 5\' 6"  (1.676 m)   Wt 179 lb (81.2 kg)   SpO2 95%   BMI 28.89 kg/m   Physical Exam  Constitutional: She is oriented to person, place, and time. She appears well-developed and well-nourished. No distress.  HENT:  Head: Normocephalic and atraumatic.  Eyes: EOM are normal. Pupils are equal, round, and reactive to light.  Pupils are 11mm and reactive.  Neck: Normal range of motion. Neck supple.  Surgical scar over the L neck. No surrounding erythema, drainage, or warmth.  Cardiovascular: Normal rate and regular rhythm.  Exam reveals no gallop and no friction rub.   No murmur heard. Pulmonary/Chest: Effort normal. She has no wheezes. She has no rales.  Diminished breath sounds. Otherwise clear.  Abdominal: Soft. She exhibits no distension. There is no tenderness.  Musculoskeletal: She exhibits no edema or tenderness.  Neurological: She is alert and oriented to person, place, and time.  CN II - XII intact. No focal weaknesses. No cerebellar signs.  Skin: Skin is warm and dry. She is not diaphoretic.  Psychiatric: She has a normal mood and affect. Her behavior is normal.  Nursing note and vitals reviewed.    ED Treatments / Results  Labs (all labs ordered are listed, but only abnormal results are displayed) Labs Reviewed  COMPREHENSIVE METABOLIC PANEL - Abnormal; Notable for the following:       Result Value   BUN 27 (*)    Creatinine, Ser 1.68 (*)    Total Protein 6.1 (*)    Albumin 2.9 (*)    GFR calc non Af Amer 30 (*)    GFR calc Af Amer 34 (*)    All other components within normal limits  CBC WITH DIFFERENTIAL/PLATELET - Abnormal; Notable for the following:    RBC 2.24 (*)    Hemoglobin 7.0 (*)    HCT 20.8 (*)    Platelets 109 (*)    All other components within normal limits  URINALYSIS, ROUTINE W REFLEX MICROSCOPIC - Abnormal; Notable for the following:    Protein, ur 100 (*)    Bacteria, UA RARE (*)    Squamous Epithelial / LPF 0-5 (*)    All  other components within normal limits  BLOOD GAS, VENOUS - Abnormal; Notable for the following:    pCO2, Ven 43.6 (*)    All other components within normal limits  I-STAT CHEM 8, ED - Abnormal; Notable for the following:    BUN 26 (*)    Creatinine, Ser 1.60 (*)    Hemoglobin 7.5 (*)    HCT 22.0 (*)    All other components within normal  limits  I-STAT CG4 LACTIC ACID, ED - Abnormal; Notable for the following:    Lactic Acid, Venous 2.83 (*)    All other components within normal limits  I-STAT TROPOININ, ED - Abnormal; Notable for the following:    Troponin i, poc 1.15 (*)    All other components within normal limits  URINE CULTURE  AMMONIA  ETHANOL  BASIC METABOLIC PANEL  CBC  CBG MONITORING, ED    EKG  EKG Interpretation  Date/Time:  Saturday Jul 17 2016 23:40:20 EDT Ventricular Rate:  64 PR Interval:    QRS Duration: 109 QT Interval:  462 QTC Calculation: 477 R Axis:   51 Text Interpretation:  Sinus or ectopic atrial rhythm RSR' in V1 or V2, probably normal variant Repol abnrm, severe global ischemia (LM/MVD) v3 now flipped, likely lead placement Otherwise no significant change Confirmed by Tyrone Nine MD, DANIEL [phone]) on 07/18/2016 12:30:09 AM       Radiology Dg Chest 2 View  Result Date: 07/18/2016 CLINICAL DATA:  71 y/o  F; weakness. EXAM: CHEST  2 VIEW COMPARISON:  01/03/2010 chest radiograph FINDINGS: Mild cardiomegaly increased from prior radiographs. Aortic calcification. Pulmonary venous hypertension. No consolidation, effusion, or pneumothorax. Mild multilevel degenerative changes of thoracic spine. IMPRESSION: Mild cardiomegaly increased from prior radiographs. Pulmonary venous hypertension. No focal consolidation. Calcific aortic atherosclerosis. Electronically Signed   By: Kristine Garbe M.D.   On: 07/18/2016 01:12   Ct Head Wo Contrast  Result Date: 07/18/2016 CLINICAL DATA:  Carotid endarterectomy on 07/14/2016. Increasing confusion today. Altered  mental status. Increased weakness. EXAM: CT HEAD WITHOUT CONTRAST TECHNIQUE: Contiguous axial images were obtained from the base of the skull through the vertex without intravenous contrast. COMPARISON:  MRI brain 09/30/2011.  CT head 09/28/2011. FINDINGS: Brain: Focal low-attenuation area is demonstrated in the deep white matter bilaterally in the region of the basal ganglia. This is unchanged since previous study and likely represents areas of small vessel ischemia. No ventricular dilatation. No mass-effect or midline shift. No abnormal extra-axial fluid collections. Gray-white matter junctions are distinct. Basal cisterns are not effaced. No acute intracranial hemorrhage. Vascular: No hyperdense vessel or unexpected calcification. Skull: Calvarium appears intact. Sinuses/Orbits: No acute finding. Other: No significant changes since previous study. IMPRESSION: No acute intracranial abnormalities. Chronic white matter changes likely representing small vessel ischemia. No change since prior study. Electronically Signed   By: Lucienne Capers M.D.   On: 07/18/2016 00:19    Procedures Procedures (including critical care time)  Medications Ordered in ED Medications  heparin injection 5,000 Units (not administered)  sodium chloride 0.9 % bolus 1,000 mL (0 mLs Intravenous Stopped 07/18/16 0222)  aspirin chewable tablet 324 mg (324 mg Oral Given 07/18/16 0100)     Initial Impression / Assessment and Plan / ED Course  I have reviewed the triage vital signs and the nursing notes.  Pertinent labs & imaging results that were available during my care of the patient were reviewed by me and considered in my medical decision making (see chart for details).     71 yo F With a chief complaint of altered mental status. By history it sounds most likely likely area. Patient will have episodes where she repeats phrases and then will be completely lucid. Going on since she was discharged from the hospital after having  a left carotid endarterectomy. She also was noted to have an abnormal EKG and had a left heart cath with a complete RCA occlusion. No central lesions were identified. She denies  chest pain shortness of breath. Has been extremely weak over the past few days. Troponin was positive. Her EKG is slightly different than previous. On my exam the patient was hypotensive with a blood pressure in the 70s. Her lactate was elevated as well. She has been afebrile without source of infection I do not feel that this is a septic etiology. I discussed the case with Dr. Doylene Canard, I explained my concern about this potentially being Cardiogenic shock. He evaluated the patient and will admit to stepdown.  CRITICAL CARE Performed by: Cecilio Asper   Total critical care time: 80 minutes  Critical care time was exclusive of separately billable procedures and treating other patients.  Critical care was necessary to treat or prevent imminent or life-threatening deterioration.  Critical care was time spent personally by me on the following activities: development of treatment plan with patient and/or surrogate as well as nursing, discussions with consultants, evaluation of patient's response to treatment, examination of patient, obtaining history from patient or surrogate, ordering and performing treatments and interventions, ordering and review of laboratory studies, ordering and review of radiographic studies, pulse oximetry and re-evaluation of patient's condition.   Final Clinical Impressions(s) / ED Diagnoses   Final diagnoses:  Delirium  Elevated troponin  Electrocardiogram abnormal    New Prescriptions New Prescriptions   No medications on file  I personally performed the services described in this documentation, which was scribed in my presence. The recorded information has been reviewed and is accurate.     Deno Etienne, DO 07/18/16 [phone]

## 2016-07-17 NOTE — ED Triage Notes (Signed)
Pt family reports that pt had cardid endarderectomy on 07/14/16 and today began having increasing confusion today. Pt family states that she has been more week today and was not answering appropriately to family. When getting pt to do neurological testing pt was not able to follow certain commands. Pt had equal grip strength but when asking pt to do check visual fields pt had difficulty following commands.

## 2016-07-18 ENCOUNTER — Emergency Department (HOSPITAL_COMMUNITY): Payer: PPO

## 2016-07-18 ENCOUNTER — Encounter (HOSPITAL_COMMUNITY): Payer: Self-pay

## 2016-07-18 DIAGNOSIS — D638 Anemia in other chronic diseases classified elsewhere: Secondary | ICD-10-CM | POA: Diagnosis not present

## 2016-07-18 DIAGNOSIS — D508 Other iron deficiency anemias: Secondary | ICD-10-CM | POA: Diagnosis not present

## 2016-07-18 DIAGNOSIS — E119 Type 2 diabetes mellitus without complications: Secondary | ICD-10-CM | POA: Diagnosis not present

## 2016-07-18 DIAGNOSIS — Z95820 Peripheral vascular angioplasty status with implants and grafts: Secondary | ICD-10-CM | POA: Diagnosis not present

## 2016-07-18 DIAGNOSIS — R1310 Dysphagia, unspecified: Secondary | ICD-10-CM | POA: Diagnosis not present

## 2016-07-18 DIAGNOSIS — Z87891 Personal history of nicotine dependence: Secondary | ICD-10-CM | POA: Diagnosis not present

## 2016-07-18 DIAGNOSIS — E785 Hyperlipidemia, unspecified: Secondary | ICD-10-CM | POA: Diagnosis not present

## 2016-07-18 DIAGNOSIS — I517 Cardiomegaly: Secondary | ICD-10-CM | POA: Diagnosis not present

## 2016-07-18 DIAGNOSIS — I255 Ischemic cardiomyopathy: Secondary | ICD-10-CM | POA: Diagnosis not present

## 2016-07-18 DIAGNOSIS — Z531 Procedure and treatment not carried out because of patient's decision for reasons of belief and group pressure: Secondary | ICD-10-CM | POA: Diagnosis not present

## 2016-07-18 DIAGNOSIS — Z8673 Personal history of transient ischemic attack (TIA), and cerebral infarction without residual deficits: Secondary | ICD-10-CM | POA: Diagnosis not present

## 2016-07-18 DIAGNOSIS — E0800 Diabetes mellitus due to underlying condition with hyperosmolarity without nonketotic hyperglycemic-hyperosmolar coma (NKHHC): Secondary | ICD-10-CM | POA: Diagnosis not present

## 2016-07-18 DIAGNOSIS — R0603 Acute respiratory distress: Secondary | ICD-10-CM | POA: Diagnosis not present

## 2016-07-18 DIAGNOSIS — I6523 Occlusion and stenosis of bilateral carotid arteries: Secondary | ICD-10-CM | POA: Diagnosis not present

## 2016-07-18 DIAGNOSIS — I130 Hypertensive heart and chronic kidney disease with heart failure and stage 1 through stage 4 chronic kidney disease, or unspecified chronic kidney disease: Secondary | ICD-10-CM | POA: Diagnosis not present

## 2016-07-18 DIAGNOSIS — Y95 Nosocomial condition: Secondary | ICD-10-CM | POA: Diagnosis not present

## 2016-07-18 DIAGNOSIS — Z90710 Acquired absence of both cervix and uterus: Secondary | ICD-10-CM | POA: Diagnosis not present

## 2016-07-18 DIAGNOSIS — R404 Transient alteration of awareness: Secondary | ICD-10-CM | POA: Diagnosis not present

## 2016-07-18 DIAGNOSIS — R400 Somnolence: Secondary | ICD-10-CM | POA: Diagnosis not present

## 2016-07-18 DIAGNOSIS — I5033 Acute on chronic diastolic (congestive) heart failure: Secondary | ICD-10-CM | POA: Diagnosis not present

## 2016-07-18 DIAGNOSIS — R748 Abnormal levels of other serum enzymes: Secondary | ICD-10-CM | POA: Diagnosis not present

## 2016-07-18 DIAGNOSIS — I639 Cerebral infarction, unspecified: Secondary | ICD-10-CM | POA: Diagnosis not present

## 2016-07-18 DIAGNOSIS — R4182 Altered mental status, unspecified: Secondary | ICD-10-CM | POA: Diagnosis not present

## 2016-07-18 DIAGNOSIS — R778 Other specified abnormalities of plasma proteins: Secondary | ICD-10-CM | POA: Diagnosis not present

## 2016-07-18 DIAGNOSIS — E876 Hypokalemia: Secondary | ICD-10-CM | POA: Diagnosis not present

## 2016-07-18 DIAGNOSIS — I10 Essential (primary) hypertension: Secondary | ICD-10-CM | POA: Diagnosis not present

## 2016-07-18 DIAGNOSIS — I739 Peripheral vascular disease, unspecified: Secondary | ICD-10-CM | POA: Diagnosis not present

## 2016-07-18 DIAGNOSIS — E118 Type 2 diabetes mellitus with unspecified complications: Secondary | ICD-10-CM | POA: Diagnosis not present

## 2016-07-18 DIAGNOSIS — I959 Hypotension, unspecified: Secondary | ICD-10-CM | POA: Diagnosis not present

## 2016-07-18 DIAGNOSIS — I2510 Atherosclerotic heart disease of native coronary artery without angina pectoris: Secondary | ICD-10-CM | POA: Diagnosis not present

## 2016-07-18 DIAGNOSIS — E1140 Type 2 diabetes mellitus with diabetic neuropathy, unspecified: Secondary | ICD-10-CM | POA: Diagnosis not present

## 2016-07-18 DIAGNOSIS — E1122 Type 2 diabetes mellitus with diabetic chronic kidney disease: Secondary | ICD-10-CM | POA: Diagnosis not present

## 2016-07-18 DIAGNOSIS — J189 Pneumonia, unspecified organism: Secondary | ICD-10-CM | POA: Diagnosis not present

## 2016-07-18 DIAGNOSIS — R531 Weakness: Secondary | ICD-10-CM | POA: Diagnosis not present

## 2016-07-18 DIAGNOSIS — D500 Iron deficiency anemia secondary to blood loss (chronic): Secondary | ICD-10-CM | POA: Diagnosis not present

## 2016-07-18 DIAGNOSIS — R0602 Shortness of breath: Secondary | ICD-10-CM | POA: Diagnosis not present

## 2016-07-18 DIAGNOSIS — N183 Chronic kidney disease, stage 3 (moderate): Secondary | ICD-10-CM | POA: Diagnosis not present

## 2016-07-18 DIAGNOSIS — E1151 Type 2 diabetes mellitus with diabetic peripheral angiopathy without gangrene: Secondary | ICD-10-CM | POA: Diagnosis not present

## 2016-07-18 LAB — URINALYSIS, ROUTINE W REFLEX MICROSCOPIC
Bilirubin Urine: NEGATIVE
Bilirubin Urine: NEGATIVE
GLUCOSE, UA: NEGATIVE mg/dL
Glucose, UA: 50 mg/dL — AB
HGB URINE DIPSTICK: NEGATIVE
KETONES UR: NEGATIVE mg/dL
KETONES UR: NEGATIVE mg/dL
LEUKOCYTES UA: NEGATIVE
Leukocytes, UA: NEGATIVE
NITRITE: NEGATIVE
Nitrite: NEGATIVE
PH: 5 (ref 5.0–8.0)
Protein, ur: 100 mg/dL — AB
Specific Gravity, Urine: 1.009 (ref 1.005–1.030)
Specific Gravity, Urine: 1.016 (ref 1.005–1.030)
pH: 5 (ref 5.0–8.0)

## 2016-07-18 LAB — CBC WITH DIFFERENTIAL/PLATELET
Basophils Absolute: 0 10*3/uL (ref 0.0–0.1)
Basophils Relative: 0 %
EOS ABS: 0.1 10*3/uL (ref 0.0–0.7)
Eosinophils Relative: 1 %
HEMATOCRIT: 20.8 % — AB (ref 36.0–46.0)
HEMOGLOBIN: 7 g/dL — AB (ref 12.0–15.0)
LYMPHS ABS: 1.5 10*3/uL (ref 0.7–4.0)
Lymphocytes Relative: 17 %
MCH: 31.3 pg (ref 26.0–34.0)
MCHC: 33.7 g/dL (ref 30.0–36.0)
MCV: 92.9 fL (ref 78.0–100.0)
MONOS PCT: 5 %
Monocytes Absolute: 0.5 10*3/uL (ref 0.1–1.0)
NEUTROS PCT: 77 %
Neutro Abs: 6.7 10*3/uL (ref 1.7–7.7)
Platelets: 109 10*3/uL — ABNORMAL LOW (ref 150–400)
RBC: 2.24 MIL/uL — AB (ref 3.87–5.11)
RDW: 13.3 % (ref 11.5–15.5)
WBC: 8.7 10*3/uL (ref 4.0–10.5)

## 2016-07-18 LAB — I-STAT TROPONIN, ED: Troponin i, poc: 1.15 ng/mL (ref 0.00–0.08)

## 2016-07-18 LAB — BASIC METABOLIC PANEL
Anion gap: 11 (ref 5–15)
BUN: 20 mg/dL (ref 6–20)
CHLORIDE: 107 mmol/L (ref 101–111)
CO2: 21 mmol/L — AB (ref 22–32)
Calcium: 8.4 mg/dL — ABNORMAL LOW (ref 8.9–10.3)
Creatinine, Ser: 1.56 mg/dL — ABNORMAL HIGH (ref 0.44–1.00)
GFR calc Af Amer: 37 mL/min — ABNORMAL LOW (ref >60)
GFR calc non Af Amer: 32 mL/min — ABNORMAL LOW (ref >60)
GLUCOSE: 128 mg/dL — AB (ref 65–99)
POTASSIUM: 3.6 mmol/L (ref 3.5–5.1)
Sodium: 139 mmol/L (ref 135–145)

## 2016-07-18 LAB — IRON AND TIBC
Iron: 18 ug/dL — ABNORMAL LOW (ref 28–170)
Saturation Ratios: 9 % — ABNORMAL LOW (ref 10.4–31.8)
TIBC: 207 ug/dL — AB (ref 250–450)
UIBC: 189 ug/dL

## 2016-07-18 LAB — BLOOD GAS, VENOUS
Acid-Base Excess: 0.2 mmol/L (ref 0.0–2.0)
BICARBONATE: 25 mmol/L (ref 20.0–28.0)
FIO2: 21
O2 Saturation: 41.1 %
PCO2 VEN: 43.6 mmHg — AB (ref 44.0–60.0)
PH VEN: 7.375 (ref 7.250–7.430)
Patient temperature: 98

## 2016-07-18 LAB — I-STAT CHEM 8, ED
BUN: 26 mg/dL — ABNORMAL HIGH (ref 6–20)
CHLORIDE: 106 mmol/L (ref 101–111)
Calcium, Ion: 1.15 mmol/L (ref 1.15–1.40)
Creatinine, Ser: 1.6 mg/dL — ABNORMAL HIGH (ref 0.44–1.00)
GLUCOSE: 91 mg/dL (ref 65–99)
HEMATOCRIT: 22 % — AB (ref 36.0–46.0)
Hemoglobin: 7.5 g/dL — ABNORMAL LOW (ref 12.0–15.0)
POTASSIUM: 3.7 mmol/L (ref 3.5–5.1)
Sodium: 140 mmol/L (ref 135–145)
TCO2: 21 mmol/L (ref 0–100)

## 2016-07-18 LAB — CBC
HEMATOCRIT: 22.9 % — AB (ref 36.0–46.0)
HEMOGLOBIN: 7.7 g/dL — AB (ref 12.0–15.0)
MCH: 31.3 pg (ref 26.0–34.0)
MCHC: 33.6 g/dL (ref 30.0–36.0)
MCV: 93.1 fL (ref 78.0–100.0)
Platelets: 114 10*3/uL — ABNORMAL LOW (ref 150–400)
RBC: 2.46 MIL/uL — ABNORMAL LOW (ref 3.87–5.11)
RDW: 13.5 % (ref 11.5–15.5)
WBC: 10.3 10*3/uL (ref 4.0–10.5)

## 2016-07-18 LAB — COMPREHENSIVE METABOLIC PANEL
ALK PHOS: 86 U/L (ref 38–126)
ALT: 17 U/L (ref 14–54)
AST: 32 U/L (ref 15–41)
Albumin: 2.9 g/dL — ABNORMAL LOW (ref 3.5–5.0)
Anion gap: 10 (ref 5–15)
BUN: 27 mg/dL — AB (ref 6–20)
CALCIUM: 8.9 mg/dL (ref 8.9–10.3)
CO2: 23 mmol/L (ref 22–32)
CREATININE: 1.68 mg/dL — AB (ref 0.44–1.00)
Chloride: 105 mmol/L (ref 101–111)
GFR calc non Af Amer: 30 mL/min — ABNORMAL LOW (ref >60)
GFR, EST AFRICAN AMERICAN: 34 mL/min — AB (ref >60)
GLUCOSE: 95 mg/dL (ref 65–99)
Potassium: 3.6 mmol/L (ref 3.5–5.1)
SODIUM: 138 mmol/L (ref 135–145)
Total Bilirubin: 0.5 mg/dL (ref 0.3–1.2)
Total Protein: 6.1 g/dL — ABNORMAL LOW (ref 6.5–8.1)

## 2016-07-18 LAB — GLUCOSE, CAPILLARY: GLUCOSE-CAPILLARY: 105 mg/dL — AB (ref 65–99)

## 2016-07-18 LAB — I-STAT CG4 LACTIC ACID, ED: Lactic Acid, Venous: 2.83 mmol/L (ref 0.5–1.9)

## 2016-07-18 LAB — FERRITIN: Ferritin: 67 ng/mL (ref 11–307)

## 2016-07-18 LAB — ETHANOL

## 2016-07-18 LAB — AMMONIA: AMMONIA: 26 umol/L (ref 9–35)

## 2016-07-18 MED ORDER — INSULIN ASPART 100 UNIT/ML ~~LOC~~ SOLN
0.0000 [IU] | Freq: Three times a day (TID) | SUBCUTANEOUS | Status: DC
  Administered 2016-07-19: 3 [IU] via SUBCUTANEOUS
  Administered 2016-07-19: 2 [IU] via SUBCUTANEOUS
  Administered 2016-07-19: 3 [IU] via SUBCUTANEOUS
  Administered 2016-07-20 – 2016-07-21 (×3): 2 [IU] via SUBCUTANEOUS
  Administered 2016-07-21: 3 [IU] via SUBCUTANEOUS
  Administered 2016-07-21: 2 [IU] via SUBCUTANEOUS
  Administered 2016-07-22: 3 [IU] via SUBCUTANEOUS
  Administered 2016-07-22 – 2016-07-23 (×3): 2 [IU] via SUBCUTANEOUS
  Administered 2016-07-23: 3 [IU] via SUBCUTANEOUS
  Administered 2016-07-23: 2 [IU] via SUBCUTANEOUS
  Administered 2016-07-24: 3 [IU] via SUBCUTANEOUS
  Administered 2016-07-24: 2 [IU] via SUBCUTANEOUS

## 2016-07-18 MED ORDER — ASPIRIN EC 81 MG PO TBEC
81.0000 mg | DELAYED_RELEASE_TABLET | Freq: Every day | ORAL | Status: DC
  Administered 2016-07-18 – 2016-07-23 (×6): 81 mg via ORAL
  Filled 2016-07-18 (×6): qty 1

## 2016-07-18 MED ORDER — ASPIRIN 81 MG PO CHEW
324.0000 mg | CHEWABLE_TABLET | Freq: Once | ORAL | Status: AC
  Administered 2016-07-18: 324 mg via ORAL
  Filled 2016-07-18: qty 4

## 2016-07-18 MED ORDER — HEPARIN SODIUM (PORCINE) 5000 UNIT/ML IJ SOLN
5000.0000 [IU] | Freq: Three times a day (TID) | INTRAMUSCULAR | Status: DC
  Administered 2016-07-18 – 2016-07-24 (×18): 5000 [IU] via SUBCUTANEOUS
  Filled 2016-07-18 (×18): qty 1

## 2016-07-18 MED ORDER — ACETAMINOPHEN 325 MG PO TABS
650.0000 mg | ORAL_TABLET | Freq: Four times a day (QID) | ORAL | Status: DC | PRN
  Administered 2016-07-18 – 2016-07-19 (×2): 650 mg via ORAL
  Filled 2016-07-18 (×2): qty 2

## 2016-07-18 MED ORDER — FE FUMARATE-B12-VIT C-FA-IFC PO CAPS
1.0000 | ORAL_CAPSULE | Freq: Two times a day (BID) | ORAL | Status: DC
  Administered 2016-07-18 – 2016-07-20 (×5): 1 via ORAL
  Filled 2016-07-18 (×6): qty 1

## 2016-07-18 MED ORDER — PREDNISOLONE ACETATE 1 % OP SUSP
1.0000 [drp] | Freq: Four times a day (QID) | OPHTHALMIC | Status: DC
  Administered 2016-07-18 – 2016-07-24 (×16): 1 [drp] via OPHTHALMIC
  Filled 2016-07-18: qty 1
  Filled 2016-07-18 (×3): qty 5
  Filled 2016-07-18 (×2): qty 1

## 2016-07-18 MED ORDER — DEXTROSE 5 % IV SOLN
1.0000 g | INTRAVENOUS | Status: DC
  Administered 2016-07-18 – 2016-07-19 (×2): 1 g via INTRAVENOUS
  Filled 2016-07-18 (×4): qty 10

## 2016-07-18 MED ORDER — ATORVASTATIN CALCIUM 80 MG PO TABS
80.0000 mg | ORAL_TABLET | Freq: Every day | ORAL | Status: DC
  Administered 2016-07-18 – 2016-07-23 (×6): 80 mg via ORAL
  Filled 2016-07-18 (×6): qty 1

## 2016-07-18 MED ORDER — CLOPIDOGREL BISULFATE 75 MG PO TABS
75.0000 mg | ORAL_TABLET | Freq: Every day | ORAL | Status: DC
  Administered 2016-07-18 – 2016-07-23 (×6): 75 mg via ORAL
  Filled 2016-07-18 (×6): qty 1

## 2016-07-18 MED ORDER — EZETIMIBE 10 MG PO TABS
10.0000 mg | ORAL_TABLET | Freq: Every evening | ORAL | Status: DC
  Administered 2016-07-18 – 2016-07-23 (×6): 10 mg via ORAL
  Filled 2016-07-18 (×8): qty 1

## 2016-07-18 NOTE — H&P (Addendum)
Referring Physician: Clayton Lefort, MD  Yvonne Patel is an 71 y.o. female.                       Chief Complaint: Altered mental status  HPI: 71 year old female with PMH of hypertension, hyperlipidemia, type II DM, PAD, CAD, S/P carotid endarterectomy on 5/9 has headache, confusion, weakness and EKG changes of diffuse ischemia along with mildly elevated Troponin-I without chest pain or fever.  Past Medical History:  Diagnosis Date  . Cardiomyopathy ([place])   . Coronary artery disease   . Hyperlipidemia   . Hypertension   . Neuromuscular disorder (HCC)    neuropathy  . PAD (peripheral artery disease) ([place])   . Peripheral vascular disease ([place])   . Refusal of blood transfusions as patient is Jehovah's Witness   . Retinal disease   . Syncope   . TIA (transient ischemic attack) 2010; 09/2011   denies residual on 03/11/2015  . Type II diabetes mellitus ([place])       Past Surgical History:  Procedure Laterality Date  . AORTIC ARCH ANGIOGRAPHY N/A 06/22/2016   Procedure: Aortic Arch Angiography;  Surgeon: [name], MD;  Location: [place] [facility];  Service: Cardiovascular;  Laterality: N/A;  . CAROTID ANGIOGRAPHY N/A 06/22/2016   Procedure: Carotid Angiography;  Surgeon: [name], MD;  Location: [place] [facility];  Service: Cardiovascular;  Laterality: N/A;  . DILATION AND CURETTAGE OF UTERUS    . ENDARTERECTOMY Left 07/14/2016   Procedure: ENDARTERECTOMY CAROTID LEFT;  Surgeon: Elam Dutch, MD;  Location: [place];  Service: Vascular;  Laterality: Left;  . FEMORAL ARTERY STENT    . LEFT HEART CATH AND CORONARY ANGIOGRAPHY N/A 06/22/2016   Procedure: Left Heart Cath and Coronary Angiography;  Surgeon: [name], MD;  Location: [place] [facility];  Service: Cardiovascular;  Laterality: N/A;  . LOWER EXTREMITY ANGIOGRAM N/A 06/05/2012   Procedure: LOWER EXTREMITY ANGIOGRAM;  Surgeon: [name], MD;  Location: [facility] CATH LAB;  Service: Cardiovascular;  Laterality: N/A;  . LOWER EXTREMITY  ANGIOGRAM N/A 07/11/2012   Procedure: LOWER EXTREMITY ANGIOGRAM;  Surgeon: [name], MD;  Location: [facility] CATH LAB;  Service: Cardiovascular;  Laterality: N/A;  . LOWER EXTREMITY ANGIOGRAM N/A 09/19/2012   Procedure: LOWER EXTREMITY ANGIOGRAM;  Surgeon: [name], MD;  Location: [facility] C[facility] LAB;  Service: Cardiovascular;  Laterality: N/A;  . PATCH ANGIOPLASTY Left 07/14/2016   Procedure: PATCH ANGIOPLASTY;  Surgeon: Elam Dutch, MD;  Location: [place];  Service: Vascular;  Laterality: Left;  . PERIPHERAL VASCULAR CATHETERIZATION N/A 03/11/2015   Procedure: Renal Angiography;  Surgeon: [name], MD;  Location: [place] [facility];  Service: Cardiovascular;  Laterality: N/A;  . RENAL ANGIOGRAM Left 06/05/2012   Procedure: RENAL ANGIOGRAM;  Surgeon: [name], MD;  Location: [facility] CATH LAB;  Service: Cardiovascular;  Laterality: Left;  . RENAL ANGIOGRAPHY  06/22/2016   Procedure: Renal Angiography;  Surgeon: [name], MD;  Location: [place] [facility];  Service: Cardiovascular;;  . RENAL ARTERY ANGIOPLASTY Right 03/11/2015  . TUBAL LIGATION    . VAGINAL HYSTERECTOMY      History reviewed. No pertinent family history. Social History:  reports that she quit smoking about 41 years ago. Her smoking use included Cigarettes. She started smoking about 51 years ago. She has a 5.00 pack-year smoking history. She has never used smokeless tobacco. She reports that she drinks alcohol. She reports that she does  not use drugs.  Allergies:  Allergies  Allergen Reactions  . No Known Allergies      (Not in a hospital admission)  Results for orders placed or performed during the hospital encounter of 07/17/16 (from the past 48 hour(s))  CBG monitoring, ED     Status: None   Collection Time: 07/17/16 11:29 PM  Result Value Ref Range   Glucose-Capillary 94 65 - 99 mg/dL  Ammonia     Status: None   Collection Time: 07/18/16 12:24 AM  Result Value Ref Range   Ammonia 26 9 - 35 umol/L   Comprehensive metabolic panel     Status: Abnormal   Collection Time: 07/18/16 12:24 AM  Result Value Ref Range   Sodium 138 135 - 145 mmol/L   Potassium 3.6 3.5 - 5.1 mmol/L   Chloride 105 101 - 111 mmol/L   CO2 23 22 - 32 mmol/L   Glucose, Bld 95 65 - 99 mg/dL   BUN 27 (H) 6 - 20 mg/dL   Creatinine, Ser 1.68 (H) 0.44 - 1.00 mg/dL   Calcium 8.9 8.9 - 10.3 mg/dL   Total Protein 6.1 (L) 6.5 - 8.1 g/dL   Albumin 2.9 (L) 3.5 - 5.0 g/dL   AST 32 15 - 41 U/L   ALT 17 14 - 54 U/L   Alkaline Phosphatase 86 38 - 126 U/L   Total Bilirubin 0.5 0.3 - 1.2 mg/dL   GFR calc non Af Amer 30 (L) >60 mL/min   GFR calc Af Amer 34 (L) >60 mL/min    Comment: (NOTE) The eGFR has been calculated using the CKD EPI equation. This calculation has not been validated in all clinical situations. eGFR's persistently <60 mL/min signify possible Chronic Kidney Disease.    Anion gap 10 5 - 15  CBC WITH DIFFERENTIAL     Status: Abnormal   Collection Time: 07/18/16 12:24 AM  Result Value Ref Range   WBC 8.7 4.0 - 10.5 K/uL   RBC 2.24 (L) 3.87 - 5.11 MIL/uL   Hemoglobin 7.0 (L) 12.0 - 15.0 g/dL   HCT 20.8 (L) 36.0 - 46.0 %   MCV 92.9 78.0 - 100.0 fL   MCH 31.3 26.0 - 34.0 pg   MCHC 33.7 30.0 - 36.0 g/dL   RDW 13.3 11.5 - 15.5 %   Platelets 109 (L) 150 - 400 K/uL    Comment: REPEATED TO VERIFY SPECIMEN CHECKED FOR CLOTS PLATELET COUNT CONFIRMED BY SMEAR    Neutrophils Relative % 77 %   Neutro Abs 6.7 1.7 - 7.7 K/uL   Lymphocytes Relative 17 %   Lymphs Abs 1.5 0.7 - 4.0 K/uL   Monocytes Relative 5 %   Monocytes Absolute 0.5 0.1 - 1.0 K/uL   Eosinophils Relative 1 %   Eosinophils Absolute 0.1 0.0 - 0.7 K/uL   Basophils Relative 0 %   Basophils Absolute 0.0 0.0 - 0.1 K/uL  Ethanol     Status: None   Collection Time: 07/18/16 12:24 AM  Result Value Ref Range   Alcohol, Ethyl (B) <5 <5 mg/dL    Comment:        LOWEST DETECTABLE LIMIT FOR SERUM ALCOHOL IS 5 mg/dL FOR MEDICAL PURPOSES ONLY    I-stat troponin, ED     Status: Abnormal   Collection Time: 07/18/16 12:30 AM  Result Value Ref Range   Troponin i, poc 1.15 (HH) 0.00 - 0.08 ng/mL   Comment NOTIFIED PHYSICIAN    Comment 3  Comment: Due to the release kinetics of cTnI, a negative result within the first hours of the onset of symptoms does not rule out myocardial infarction with certainty. If myocardial infarction is still suspected, repeat the test at appropriate intervals.   I-Stat Chem 8, ED     Status: Abnormal   Collection Time: 07/18/16 12:31 AM  Result Value Ref Range   Sodium 140 135 - 145 mmol/L   Potassium 3.7 3.5 - 5.1 mmol/L   Chloride 106 101 - 111 mmol/L   BUN 26 (H) 6 - 20 mg/dL   Creatinine, Ser 1.60 (H) 0.44 - 1.00 mg/dL   Glucose, Bld 91 65 - 99 mg/dL   Calcium, Ion 1.15 1.15 - 1.40 mmol/L   TCO2 21 0 - 100 mmol/L   Hemoglobin 7.5 (L) 12.0 - 15.0 g/dL   HCT 22.0 (L) 36.0 - 46.0 %  Blood gas, venous     Status: Abnormal   Collection Time: 07/18/16 12:31 AM  Result Value Ref Range   FIO2 21.00    pH, Ven 7.375 7.250 - 7.430   pCO2, Ven 43.6 (L) 44.0 - 60.0 mmHg   pO2, Ven BELOW REPORTABLE RANGE. 32.0 - 45.0 mmHg    Comment: CRITICAL RESULT CALLED TO, READ BACK BY AND VERIFIED WITH: D. FLOYD, DO AT 0040 ON 07/18/16 BY D. JONES, RRT, RCP    Bicarbonate 25.0 20.0 - 28.0 mmol/L   Acid-Base Excess 0.2 0.0 - 2.0 mmol/L   O2 Saturation 41.1 %   Patient temperature 98.0    Collection site VEIN    Drawn by DRAWN BY RN    Sample type VENOUS   I-Stat CG4 Lactic Acid, ED     Status: Abnormal   Collection Time: 07/18/16 12:33 AM  Result Value Ref Range   Lactic Acid, Venous 2.83 (HH) 0.5 - 1.9 mmol/L   Comment NOTIFIED PHYSICIAN    Dg Chest 2 View  Result Date: 07/18/2016 CLINICAL DATA:  71 y/o  F; weakness. EXAM: CHEST  2 VIEW COMPARISON:  01/03/2010 chest radiograph FINDINGS: Mild cardiomegaly increased from prior radiographs. Aortic calcification. Pulmonary venous hypertension. No  consolidation, effusion, or pneumothorax. Mild multilevel degenerative changes of thoracic spine. IMPRESSION: Mild cardiomegaly increased from prior radiographs. Pulmonary venous hypertension. No focal consolidation. Calcific aortic atherosclerosis. Electronically Signed   By: Kristine Garbe M.D.   On: 07/18/2016 01:12   Ct Head Wo Contrast  Result Date: 07/18/2016 CLINICAL DATA:  Carotid endarterectomy on 07/14/2016. Increasing confusion today. Altered mental status. Increased weakness. EXAM: CT HEAD WITHOUT CONTRAST TECHNIQUE: Contiguous axial images were obtained from the base of the skull through the vertex without intravenous contrast. COMPARISON:  MRI brain 09/30/2011.  CT head 09/28/2011. FINDINGS: Brain: Focal low-attenuation area is demonstrated in the deep white matter bilaterally in the region of the basal ganglia. This is unchanged since previous study and likely represents areas of small vessel ischemia. No ventricular dilatation. No mass-effect or midline shift. No abnormal extra-axial fluid collections. Gray-white matter junctions are distinct. Basal cisterns are not effaced. No acute intracranial hemorrhage. Vascular: No hyperdense vessel or unexpected calcification. Skull: Calvarium appears intact. Sinuses/Orbits: No acute finding. Other: No significant changes since previous study. IMPRESSION: No acute intracranial abnormalities. Chronic white matter changes likely representing small vessel ischemia. No change since prior study. Electronically Signed   By: Lucienne Capers M.D.   On: 07/18/2016 00:19    Review Of Systems Constitutional: No fever, chills, weight loss or gain. Eyes: No vision change, wears glasses.  No discharge or pain. Ears: No hearing loss, No tinnitus. Respiratory: No asthma, COPD, pneumonias. No shortness of breath. No hemoptysis. Cardiovascular: No chest pain, palpitation, leg edema. Gastrointestinal: No nausea, vomiting, diarrhea, constipation. No GI  bleed. No hepatitis. Genitourinary: No dysuria, hematuria, kidney stone. No incontinance. Neurological: Positive headache, no stroke, seizures.  Psychiatry: No psych facility admission for anxiety, depression, suicide. No detox. Skin: No rash. Musculoskeletal: No joint pain, fibromyalgia. No neck pain, back pain. Lymphadenopathy: No lymphadenopathy. Hematology: Positive anemia or easy bruising.   Blood pressure (!) 134/55, pulse 63, temperature 98 F (36.7 C), temperature source Oral, resp. rate 18, height 5' 6" (1.676 m), weight 81.2 kg (179 lb), SpO2 98 %. Body mass index is 28.89 kg/m. General appearance: alert, cooperative, appears stated age and no distress Head: Normocephalic, atraumatic. Eyes: Brown, pale conjunctiva, corneas clear. PERRL, EOM's intact. Neck: No adenopathy, no carotid bruit, no JVD, supple, symmetrical, trachea midline and thyroid not enlarged. Resp: Clear to auscultation bilaterally. Cardio: Regular rate and rhythm, S1, S2 normal, II/VI systolic murmur, no click, rub or gallop GI: Soft, non-tender; bowel sounds normal; no organomegaly. Extremities: No edema, cyanosis or clubbing. Skin: Warm and dry.  Neurologic: Alert and oriented X 2, normal strength. Movesall 4 extremities..  Assessment/Plan Altered mental status Weakness Symptomatic anemia, anemia of blood loss CAD Ischemic cardiomyopathy PAD PVD TIA DM, II  Admit to step down. IV fluids. Echocardiogram. Blood transfusion if patient accepts(Jehovah's Witness).  Birdie Riddle, MD  07/18/2016, 1:50 AM

## 2016-07-18 NOTE — Progress Notes (Signed)
Pt in the bed, daughters at bedside. Pt denies pain but states she is hungry and wants to know if she can have solid food. Will page MD to ask. Consuelo Pandy RN

## 2016-07-18 NOTE — Progress Notes (Signed)
   07/18/16 2036  Vitals  Temp (!) 101.9 F (38.8 C)  Temp Source Oral  BP 131/77  MAP (mmHg) 92  BP Location Left Arm  BP Method Automatic  Patient Position (if appropriate) Lying  Pulse Rate 80  Pulse Rate Source Monitor  ECG Heart Rate 79  Cardiac Rhythm NSR  Resp (!) 29   Pt had temp of 101.9 paged Dr Doylene Canard who ordered prn  tylenol

## 2016-07-18 NOTE — Plan of Care (Signed)
Problem: Skin Integrity: Goal: Risk for impaired skin integrity will decrease Outcome: Progressing Skin is wdl, pt turns skin dry and warm

## 2016-07-18 NOTE — Progress Notes (Signed)
Pt very upset that she's on clear liquid diet. Paged Purcell Nails NP, NP stated she's not covering for the pt and don't know anything about the pt. I called the office, office paged Purcell Nails, NP.  Pt daughters are very upset, thinks mom is having altered mental status, and she's different from this morning. I did neuro assessment, Pt has not changed since she come from ICU.  I was able to get in touch with Dr. Doylene Canard. Told MD about pt situation and fever of 101.4 with blood sugar of 105. MD gave verbal ordered BMET, CBC, blood culture x2, IV Rocefin 1g daily, and urine analysis. Will continue to monitor pt.

## 2016-07-18 NOTE — ED Notes (Signed)
[facility] here to transport pt to [facility].

## 2016-07-18 NOTE — ED Notes (Signed)
Notified EDP,Floyd,MD., pt. I-stat CG4 Lactic acid results 2.83 and RN,Allan made aware.

## 2016-07-18 NOTE — Progress Notes (Signed)
Daughter contacted per patient request. Patient has requested she bring several items from home. Daughter will bring items and will be on her way to visit shortly. Gave brief update on condition per patients request.

## 2016-07-18 NOTE — ED Notes (Signed)
[facility] was called and notified of pt's need of transportation to [facility].

## 2016-07-18 NOTE — Progress Notes (Signed)
Report called to 4E to [facility].

## 2016-07-19 ENCOUNTER — Other Ambulatory Visit (HOSPITAL_COMMUNITY): Payer: PPO

## 2016-07-19 LAB — LACTIC ACID, PLASMA: LACTIC ACID, VENOUS: 1 mmol/L (ref 0.5–1.9)

## 2016-07-19 LAB — GLUCOSE, CAPILLARY
GLUCOSE-CAPILLARY: 191 mg/dL — AB (ref 65–99)
Glucose-Capillary: 156 mg/dL — ABNORMAL HIGH (ref 65–99)
Glucose-Capillary: 124 mg/dL — ABNORMAL HIGH (ref 65–99)
Glucose-Capillary: 151 mg/dL — ABNORMAL HIGH (ref 65–99)

## 2016-07-19 LAB — URINE CULTURE: Culture: <10000 — AB

## 2016-07-19 NOTE — Plan of Care (Signed)
Problem: Activity: Goal: Risk for activity intolerance will decrease Outcome: Progressing Pt oob with assist, is steal weak but improving

## 2016-07-19 NOTE — Care Management Note (Addendum)
Case Management Note  Patient Details  Name: Yvonne Patel MRN: [id number] Date of Birth: March 17, 1945  Subjective/Objective:  From home, s/p CEA, then had AMS, went to [facility] now at [facility].  Patient plans to stay will daughter at discharge.    5/17 [place], BSN - per pt eval rec HHPT,  NCM offered choice for HHPT, patient states she would like to wait until tomorrow to decide, NCM also informed her that with [employer] she may have a 25.00 co pay.  She wanted me to check with her insurance to see what her co pay would be.  NCM informed her , would let her know later today.    PCP listed as Kelton Pillar:                Action/Plan: NCM will follow for dc needs.  Expected Discharge Date:  07/22/16               Expected Discharge Plan:  Home/Self Care  In-House Referral:     Discharge planning Services  CM Consult  Post Acute Care Choice:    Choice offered to:     DME Arranged:    DME Agency:     HH Arranged:    HH Agency:     Status of Service:  In process, will continue to follow  If discussed at Long Length of Stay Meetings, dates discussed:    Additional Comments:  [name], RN 07/19/2016, 3:28 PM

## 2016-07-19 NOTE — Discharge Summary (Signed)
[facility] Discharge Summary   Patient ID:  Yvonne Patel MRN: [id number] DOB/AGE: 04-14-1945 71 y.o.  Admit date: 07/14/2016 Discharge date: 07/15/2016 Date of Surgery: 07/14/2016 Surgeon: Surgeon(s): Yvonne Dutch, MD  Admission Diagnosis: Left carotid artery stenosis I65.22  Discharge Diagnoses:  Left carotid artery stenosis I65.22  Secondary Diagnoses: Past Medical History:  Diagnosis Date  . Cardiomyopathy ([place])   . Coronary artery disease   . Hyperlipidemia   . Hypertension   . Neuromuscular disorder (HCC)    neuropathy  . PAD (peripheral artery disease) ([place])   . Peripheral vascular disease ([place])   . Refusal of blood transfusions as patient is Jehovah's Witness   . Retinal disease   . Syncope   . TIA (transient ischemic attack) 2010; 09/2011   denies residual on 03/11/2015  . Type II diabetes mellitus (HCC)     Procedure(s): ENDARTERECTOMY CAROTID LEFT PATCH ANGIOPLASTY  Discharged Condition: good  HPI: carotid stenosis. Yvonne Patel a 70 y.o.femalereferred by Dr. Nadyne Patel for evaluation of left internal carotid artery stenosis. 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 She is currently asymptomatic. She denies any symptoms of TIA amaurosis or stroke within the last year. The carotid stenosis was picked up on surveillance. This was confirmed on recent carotid angio at the time of cardiac cath.  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  Cardiac cath showed multivessel distal disease with occluded RCA and EF 35-45% medical management recommended.  The patient does has a history of peripheral arterial disease and has previously had lower extremity stenting and renal artery stenting by Dr. Nadyne Patel. She did smoke in the past but quit about 30 years ago. She currently is able to walk 1-2 blocks without experiencing lower extremity symptoms. She is on aspirin Plavix and a statin.   Hospital  Course:  Yvonne Patel is a 71 y.o. female is S/P Procedure(s): ENDARTERECTOMY CAROTID LEFT PATCH ANGIOPLASTY POD # 1 left CEA  Plan to discharge drain D/C home today after she ambulates and is tolerating breakfast. F/U in 2-3 weeks with Dr. Carvel Patel, Yvonne Patel [facility] 07/15/2016 7:47 AM -- No hematoma Neuro intact D/c home  Yvonne Hinds, MD   Significant Diagnostic Studies: CBC Lab Results  Component Value Date   WBC 10.3 07/18/2016   HGB 7.7 (L) 07/18/2016   HCT 22.9 (L) 07/18/2016   MCV 93.1 07/18/2016   PLT 114 (L) 07/18/2016    BMET    Component Value Date/Time   NA 139 07/18/2016 1651   K 3.6 07/18/2016 1651   CL 107 07/18/2016 1651   CO2 21 (L) 07/18/2016 1651   GLUCOSE 128 (H) 07/18/2016 1651   BUN 20 07/18/2016 1651   CREATININE 1.56 (H) 07/18/2016 1651   CALCIUM 8.4 (L) 07/18/2016 1651   GFRNONAA 32 (L) 07/18/2016 1651   GFRAA 37 (L) 07/18/2016 1651   COAG Lab Results  Component Value Date   INR 0.98 07/01/2016   INR 1.03 02/17/2009   INR 1.0 11/13/2008     Disposition:  Discharge to :Home Discharge Instructions    Call MD for:  redness, tenderness, or signs of infection (pain, swelling, bleeding, redness, odor or green/yellow discharge around incision site)    Complete by:  As directed    Call MD for:  severe or increased pain, loss or decreased feeling  in affected limb(s)    Complete by:  As directed  Call MD for:  temperature >100.5    Complete by:  As directed    Discharge instructions    Complete by:  As directed    Keep the dressing in place for 24 hours, then you can remove them and shower daily as needed   Driving Restrictions    Complete by:  As directed    No driving for 1 week   Increase activity slowly    Complete by:  As directed    Walk with assistance use walker or cane as needed   Lifting restrictions    Complete by:  As directed    No lifting for 3 weeks   Resume previous diet    Complete by:  As  directed      Allergies as of 07/15/2016      Reactions   No Known Allergies       Medication List    TAKE these medications   amLODipine 10 MG tablet Commonly known as:  NORVASC Take 10 mg by mouth daily at 12 noon.   aspirin EC 81 MG tablet Take 81 mg by mouth at bedtime.   atorvastatin 80 MG tablet Commonly known as:  LIPITOR Take 80 mg by mouth daily at 6 PM.   clopidogrel 75 MG tablet Commonly known as:  PLAVIX Take 75 mg by mouth at bedtime.   ezetimibe 10 MG tablet Commonly known as:  ZETIA Take 10 mg by mouth every evening.   labetalol 200 MG tablet Commonly known as:  NORMODYNE Take 200 mg by mouth 2 (two) times daily.   losartan-hydrochlorothiazide 100-25 MG tablet Commonly known as:  HYZAAR Take 1 tablet by mouth every evening.   metFORMIN 500 MG tablet Commonly known as:  GLUCOPHAGE Take 2 tablets (1,000 mg total) by mouth 2 (two) times daily.   oxyCODONE-acetaminophen 5-325 MG tablet Commonly known as:  PERCOCET/ROXICET Take 1 tablet by mouth every 6 (six) hours as needed for moderate pain.   prednisoLONE acetate 1 % ophthalmic suspension Commonly known as:  PRED FORTE Place 1 drop into the left eye 4 (four) times daily.   TRESIBA FLEXTOUCH 100 UNIT/ML Sopn FlexTouch Pen Generic drug:  insulin degludec Inject 30 Units into the skin daily.   Vitamin D (Ergocalciferol) 50000 units Caps capsule Commonly known as:  DRISDOL Take 50,000 Units by mouth 2 (two) times a week. Sunday and Thursday      Verbal and written Discharge instructions given to the patient. Wound care per Discharge AVS Follow-up Information    Yvonne Dutch, MD Follow up in 2 week(s).   Specialties:  Vascular Surgery, Cardiology Why:  office will call Contact information: [address] [place] [address] [phone]           Signed: Laurence Patel [facility] 07/19/2016, 10:54 AM --- For VQI Registry use --- Instructions: Press F2 to tab through selections.   Delete question if not applicable.   Modified Rankin score at D/C (0-6): Rankin Score=0  IV medication needed for:  1. Hypertension: No 2. Hypotension: No  Post-op Complications: No  1. Post-op CVA or TIA: No  If yes: Event classification (right eye, left eye, right cortical, left cortical, verterobasilar, other):   If yes: Timing of event (intra-op, <6 hrs post-op, >=6 hrs post-op, unknown):   2. CN injury: No  If yes: CN  injuried   3. Myocardial infarction: No  If yes: Dx by (EKG or clinical, Troponin):   4.  CHF: No  5.  Dysrhythmia (new): No  6. Wound infection: No  7. Reperfusion symptoms: No  8. Return to OR: No  If yes: return to OR for (bleeding, neurologic, other CEA incision, other):   Discharge medications: Statin use:  Yes ASA use:  Yes Beta blocker use:  Yes ACE-Inhibitor use:  No  for medical reason   P2Y12 Antagonist use: [ ]  None, [x ] Plavix, [ ]  Plasugrel, [ ]  Ticlopinine, [ ]  Ticagrelor, [ ]  Other, [ ]  No for medical reason, [ ]  Non-compliant, [ ]  Not-indicated Anti-coagulant use:  [x ] None, [ ]  Warfarin, [ ]  Rivaroxaban, [ ]  Dabigatran, [ ]  Other, [ ]  No for medical reason, [ ]  Non-compliant, [ ]  Not-indicated

## 2016-07-19 NOTE — Progress Notes (Addendum)
[facility] [place]  Subjective  - Doing OK over all.  Alert and oriented to people and place.  HPI: 71 y/o female had left CEA performed by Dr. Camelia Eng on 07/14/2016.  He stay over night was uneventful.  She was discharged to following day.  N/M/V intact.  She reported to the ED5/13/2018 with complaints of confusion/ altered mental status.  Work up for confusion and r/o CVA post left CEA. Her EKG showed diffuse ischemia and mildly elevated Troponin with out chest pain.     Objective (!) 152/59 68 98.7 F (37.1 C) (Oral) 20 100%  Intake/Output Summary (Last 24 hours) at 07/19/16 1108 Last data filed at 07/19/16 0338  Gross per 24 hour  Intake                0 ml  Output              350 ml  Net             -350 ml   CT of brain 07/18/2016 IMPRESSION: No acute intracranial abnormalities. Chronic white matter changes likely representing small vessel ischemia. No change since prior study.  Strength grossly 5/5 in all 4 extremities. Left carotid neck incision soft without hematoma, incision healing well. No tongue deviation, smile is symmetric Heart RRR Lungs non labored breathing   Assessment/Planning: S/P left CEA 07/14/2016 Alerted mental status and EKG changes Anemia 7.0 at admission now 7.7 without transfusion.    Her mental state is back to baseline today.   Had episode of fever. UA was unremarkable. T max 101.9 degree. Blood cultures x 2 done and IV rocephin started.  Neck incision is soft with out erythema and healing well.  There is no evidence of CVA s/p left CEA.   Laurence Slate [facility] 07/19/2016 11:08 AM -- Still some confusion according to nurse and daughter.   ?UTI but fairly unremarkable urine Febrile unknown source, left neck incision clean without erythema non WBC elevation Would consider brain MRI if remains confused and no other explanation  [name], MD [facility] [place]:  [phone] Pager: [phone]  Laboratory Lab Results:  Recent Labs  07/18/16 0024 07/18/16 0031 07/18/16 1651  WBC 8.7  --  10.3  HGB 7.0* 7.5* 7.7*  HCT 20.8* 22.0* 22.9*  PLT 109*  --  114*   BMET  Recent Labs  07/18/16 0024 07/18/16 0031 07/18/16 1651  NA 138 140 139  K 3.6 3.7 3.6  CL 105 106 107  CO2 23  --  21*  GLUCOSE 95 91 128*  BUN 27* 26* 20  CREATININE 1.68* 1.60* 1.56*  CALCIUM 8.9  --  8.4*    COAG Lab Results  Component Value Date   INR 0.98 07/01/2016   INR 1.03 02/17/2009   INR 1.0 11/13/2008   No results found for: PTT

## 2016-07-19 NOTE — Progress Notes (Signed)
Ref: Kelton Pillar, MD   Subjective:  Had episode of fever. UA was unremarkable. T max 101.9 degree. Blood cultures x 2 done and IV rocephin started. Left neck surgical wound tender to touch but appears healing. Awake and less confused today.  Objective:  Vital Signs in the last 24 hours: Temp:  [98 F (36.7 C)-101.9 F (38.8 C)] 98.7 F (37.1 C) (05/14 0804) Pulse Rate:  [59-80] 68 (05/14 0804) Cardiac Rhythm: Normal sinus rhythm (05/14 0830) Resp:  [14-29] 20 (05/14 0804) BP: (125-152)/(48-85) 152/59 (05/14 0804) SpO2:  [94 %-100 %] 100 % (05/14 0804) Weight:  [84.6 kg (186 lb 8.2 oz)] 84.6 kg (186 lb 8.2 oz) (05/14 0500)  Physical Exam: BP Readings from Last 1 Encounters:  07/19/16 (!) 152/59    Wt Readings from Last 1 Encounters:  07/19/16 84.6 kg (186 lb 8.2 oz)    Weight change: 3.406 kg (7 lb 8.2 oz) Body mass index is 31.04 kg/m. HEENT: [name]/AT, Eyes-Brown, PERL, EOMI, Conjunctiva-Pink, Sclera-Non-icteric Neck: No JVD, No bruit, Trachea midline. Left neck anterior scar of recent surgery. Lungs:  Clear, Bilateral. Cardiac:  Regular rhythm, normal S1 and S2, no S3. II/VI systolic murmur. Abdomen:  Soft, non-tender. BS present. Extremities:  No edema present. No cyanosis. No clubbing. CNS: AxOx3, Cranial nerves grossly intact, moves all 4 extremities.  Skin: Warm and dry.   Intake/Output from previous day: 05/13 0701 - 05/14 0700 In: 240 [P.O.:240] Out: 900 [Urine:900]    Lab Results: BMET    Component Value Date/Time   NA 139 07/18/2016 1651   NA 140 07/18/2016 0031   NA 138 07/18/2016 0024   K 3.6 07/18/2016 1651   K 3.7 07/18/2016 0031   K 3.6 07/18/2016 0024   CL 107 07/18/2016 1651   CL 106 07/18/2016 0031   CL 105 07/18/2016 0024   CO2 21 (L) 07/18/2016 1651   CO2 23 07/18/2016 0024   CO2 22 07/15/2016 0424   GLUCOSE 128 (H) 07/18/2016 1651   GLUCOSE 91 07/18/2016 0031   GLUCOSE 95 07/18/2016 0024   BUN 20 07/18/2016 1651   BUN 26 (H)  07/18/2016 0031   BUN 27 (H) 07/18/2016 0024   CREATININE 1.56 (H) 07/18/2016 1651   CREATININE 1.60 (H) 07/18/2016 0031   CREATININE 1.68 (H) 07/18/2016 0024   CALCIUM 8.4 (L) 07/18/2016 1651   CALCIUM 8.9 07/18/2016 0024   CALCIUM 8.4 (L) 07/15/2016 0424   GFRNONAA 32 (L) 07/18/2016 1651   GFRNONAA 30 (L) 07/18/2016 0024   GFRNONAA 36 (L) 07/15/2016 0424   GFRAA 37 (L) 07/18/2016 1651   GFRAA 34 (L) 07/18/2016 0024   GFRAA 41 (L) 07/15/2016 0424   CBC    Component Value Date/Time   WBC 10.3 07/18/2016 1651   RBC 2.46 (L) 07/18/2016 1651   HGB 7.7 (L) 07/18/2016 1651   HCT 22.9 (L) 07/18/2016 1651   PLT 114 (L) 07/18/2016 1651   MCV 93.1 07/18/2016 1651   MCH 31.3 07/18/2016 1651   MCHC 33.6 07/18/2016 1651   RDW 13.5 07/18/2016 1651   LYMPHSABS 1.5 07/18/2016 0024   MONOABS 0.5 07/18/2016 0024   EOSABS 0.1 07/18/2016 0024   BASOSABS 0.0 07/18/2016 0024   HEPATIC Function Panel  Recent Labs  07/01/16 1552 07/18/16 0024  PROT 6.8 6.1*   HEMOGLOBIN A1C No components found for: HGA1C,  MPG CARDIAC ENZYMES Lab Results  Component Value Date   CKTOTAL 91 09/29/2011   CKMB 2.6 09/29/2011   TROPONINI <0.30 09/29/2011  TROPONINI <0.30 09/28/2011   TROPONINI <0.30 09/28/2011   BNP No results for input(s): PROBNP in the last 8760 hours. TSH No results for input(s): TSH in the last 8760 hours. CHOLESTEROL No results for input(s): CHOL in the last 8760 hours.  Scheduled Meds: . aspirin EC  81 mg Oral QHS  . atorvastatin  80 mg Oral q1800  . clopidogrel  75 mg Oral QHS  . ezetimibe  10 mg Oral QPM  . ferrous [id number] C-folic acid  1 capsule Oral BID PC  . heparin  5,000 Units Subcutaneous Q8H  . insulin aspart  0-15 Units Subcutaneous TID WC  . prednisoLONE acetate  1 drop Left Eye QID   Continuous Infusions: . cefTRIAXone (ROCEPHIN)  IV Stopped (07/18/16 2030)   PRN Meds:.acetaminophen  Assessment/Plan: Altered mental  status Weakness Fever Anemia of blood loss CAD Ischemic cardiomyopathy PAD PVD TIA DM, II Recent left carotid endarterectomy  IV antibiotic/Tylenol as needed/Advance diet   LOS: 1 day    [name]  MD  07/19/2016, 10:13 AM

## 2016-07-20 ENCOUNTER — Inpatient Hospital Stay (HOSPITAL_COMMUNITY): Payer: PPO

## 2016-07-20 ENCOUNTER — Inpatient Hospital Stay: Payer: PPO

## 2016-07-20 DIAGNOSIS — R404 Transient alteration of awareness: Secondary | ICD-10-CM

## 2016-07-20 DIAGNOSIS — N183 Chronic kidney disease, stage 3 unspecified: Secondary | ICD-10-CM

## 2016-07-20 DIAGNOSIS — I739 Peripheral vascular disease, unspecified: Secondary | ICD-10-CM

## 2016-07-20 DIAGNOSIS — R7989 Other specified abnormal findings of blood chemistry: Secondary | ICD-10-CM

## 2016-07-20 DIAGNOSIS — E118 Type 2 diabetes mellitus with unspecified complications: Secondary | ICD-10-CM

## 2016-07-20 DIAGNOSIS — R778 Other specified abnormalities of plasma proteins: Secondary | ICD-10-CM

## 2016-07-20 LAB — ECHOCARDIOGRAM COMPLETE
Ao-asc: 30 cm
Area-P 1/2: 4.07 cm2
CHL CUP DOP CALC LVOT VTI: 22.5 cm
CHL CUP MV DEC (S): 257
CHL CUP MV M VEL: 80.4
CHL CUP TV REG PEAK VELOCITY: 362 cm/s
E/e' ratio: 18.59
EWDT: 257 ms
FS: 19 % — AB (ref 28–44)
Height: 65 in
IV/PV OW: 0.93
LA ID, A-P, ES: 44 mm
LA diam end sys: 44 mm
LA diam index: 2.29 cm/m2
LA vol A4C: 91.7 ml
LA vol index: 54.2 mL/m2
LAVOL: 104 mL
LDCA: 3.46 cm2
LV E/e'average: 18.59
LV PW d: 15.2 mm — AB (ref 0.6–1.1)
LV TDI E'MEDIAL: 3.92
LVEEMED: 18.59
LVELAT: 6.94 cm/s
LVOT MV VTI INDEX: 0.9 cm2/m2
LVOT MV VTI: 1.73
LVOT SV: 78 mL
LVOTD: 21 mm
LVOTPV: 95.7 cm/s
MV Annulus VTI: 44.9 cm
MV Peak grad: 7 mmHg
MV VTI: 227 cm
MV pk E vel: 129 m/s
MVPKAVEL: 128 m/s
MVSPHT: 54 ms
Mean grad: 4 mmHg
RV LATERAL S' VELOCITY: 9.64 cm/s
RV TAPSE: 19.4 mm
TDI e' lateral: 6.94
TR max vel: 362 cm/s
Weight: 2984.15 oz

## 2016-07-20 LAB — GLUCOSE, CAPILLARY
GLUCOSE-CAPILLARY: 122 mg/dL — AB (ref 65–99)
GLUCOSE-CAPILLARY: 128 mg/dL — AB (ref 65–99)
Glucose-Capillary: 126 mg/dL — ABNORMAL HIGH (ref 65–99)
Glucose-Capillary: 109 mg/dL — ABNORMAL HIGH (ref 65–99)
Glucose-Capillary: 204 mg/dL — ABNORMAL HIGH (ref 65–99)

## 2016-07-20 LAB — HEMOGLOBIN A1C
Hgb A1c MFr Bld: 6.3 % — ABNORMAL HIGH (ref 4.8–5.6)
Mean Plasma Glucose: 134 mg/dL

## 2016-07-20 LAB — LACTIC ACID, PLASMA: LACTIC ACID, VENOUS: 2 mmol/L — AB (ref 0.5–1.9)

## 2016-07-20 LAB — PROCALCITONIN: Procalcitonin: 0.32 ng/mL

## 2016-07-20 MED ORDER — VANCOMYCIN HCL 10 G IV SOLR
1500.0000 mg | Freq: Once | INTRAVENOUS | Status: AC
  Administered 2016-07-20: 1500 mg via INTRAVENOUS
  Filled 2016-07-20: qty 1500

## 2016-07-20 MED ORDER — SODIUM CHLORIDE 0.9 % IV SOLN
125.0000 mg | Freq: Every day | INTRAVENOUS | Status: AC
  Administered 2016-07-20 – 2016-07-22 (×3): 125 mg via INTRAVENOUS
  Filled 2016-07-20 (×6): qty 10

## 2016-07-20 MED ORDER — VANCOMYCIN HCL 10 G IV SOLR
1250.0000 mg | INTRAVENOUS | Status: DC
  Administered 2016-07-21: 1250 mg via INTRAVENOUS
  Filled 2016-07-20 (×2): qty 1250

## 2016-07-20 MED ORDER — FERROUS FUMARATE 324 (106 FE) MG PO TABS
1.0000 | ORAL_TABLET | Freq: Three times a day (TID) | ORAL | Status: DC
  Administered 2016-07-20: 106 mg via ORAL
  Filled 2016-07-20 (×3): qty 1

## 2016-07-20 MED ORDER — SODIUM CHLORIDE 0.9 % IV BOLUS (SEPSIS)
500.0000 mL | Freq: Once | INTRAVENOUS | Status: AC
  Administered 2016-07-20: 500 mL via INTRAVENOUS

## 2016-07-20 MED ORDER — DEXTROSE 5 % IV SOLN
2.0000 g | INTRAVENOUS | Status: DC
  Administered 2016-07-20 – 2016-07-21 (×2): 2 g via INTRAVENOUS
  Filled 2016-07-20 (×3): qty 2

## 2016-07-20 NOTE — Progress Notes (Signed)
Pharmacy Antibiotic Note KERIA WIDRIG is a 71 y.o. female admitted on 07/17/2016 with concern for PNA.  Pharmacy has been consulted for Cefepime and vancomycin dosing.  Plan: 1. Vancomycin 1250 IV every 24 hours.  Goal trough 15-20 mcg/mL.  2. Cefepime 2 grams IV every 24 hours  3. BMP in am to assess renal function   Height: 5\' 5"  (165.1 cm) Weight: 186 lb 8.2 oz (84.6 kg) IBW/kg (Calculated) : 57  Temp (24hrs), Avg:99.3 F (37.4 C), Min:98.8 F (37.1 C), Max:101.1 F (38.4 C)   Recent Labs Lab 07/14/16 1600 07/15/16 0424 07/18/16 0024 07/18/16 0031 07/18/16 0033 07/18/16 1651 07/19/16 0231  WBC 9.3 8.4 8.7  --   --  10.3  --   CREATININE 1.31* 1.44* 1.68* 1.60*  --  1.56*  --   LATICACIDVEN  --   --   --   --  2.83*  --  1.0    Estimated Creatinine Clearance: 35.5 mL/min (A) (by C-G formula based on SCr of 1.56 mg/dL (H)).    Allergies  Allergen Reactions  . No Known Allergies     Antimicrobials this admission: 5/15 Cefepime  >>  5/15 vancomycin >>   Microbiology results: 5/16 BCx: ngtd 5/16 UCx: insignificant growth     Thank you for allowing pharmacy to be a part of this patient's care.  Vincenza Hews, PharmD, BCPS 07/20/2016, 1:48 PM

## 2016-07-20 NOTE — Consult Note (Signed)
Medical Consultation  Yvonne Patel:[id number] DOB: Jul 29, 1945 DOA: 07/17/2016   PCP: Kelton Pillar, MD   Outpatient Specialists: Dr. Einar Gip - cardiology   Requesting physician: Dr. Einar Gip   Reason for consultation: AMS, weakness, unsteady gait, fever   History of Present Illness:  Yvonne Patel is an 71 y.o. female with h/o HTN, hyperlipidemia, CAD, PAD with carotid arteries involvement - s/p recent left CEA (07/14/2016), DM type II, TIA, CKD stage III who was admitted to the [facility] on 07/18/2016 with c/o headache, weakness, mental status changes with confusion on 07/18/2016  Since her CEA patient developed progressive global weakness and became less conversational than usual, had slurred speech and complained of constant headache and poor appetite.  On admission patient was afebrile, her blood work demonstrated elevated Troponin 1.15, she had abnormal creatinine - 1.60, Hgb 7.0 and Hct 20.8%  Iron panel revealed low Fe - 18 with iron saturation 9% and low TIBC - 207  EKG demonstrated diffuse ischemic changes, but patient was angina free and did not report any increased SOB  UA was not suggestive of UTI  Chest Xray demonstrated mild cardiomegaly and pulmonary venous hypertension, no focal consolidation  Head CT didn't do acute intracranial abnormalities, demonstrated white matter changes consistent with chronic small vessel ischemic disease  She was initially afebrile, but later developed low grade fever of 45F and placed on IV Rocephin. However, despite this she still spiked a fever of 101.80F on 5/14/20118   Review of Systems:  She denied chest pa, SOB, palpitations, dizziness, c/o generalized weakness  As per HPI otherwise 10 point review of systems negative  Past Medical History:      Past Medical History:  Diagnosis Date  . Cardiomyopathy ([place])   . Coronary artery disease   . Hyperlipidemia   . Hypertension   . Neuromuscular disorder (H[facility])    neuropathy  . PAD (peripheral  artery disease) ([place])   . Peripheral vascular disease ([place])   . Refusal of blood transfusions as patient is Jehovah's Witness   . Retinal disease   . Syncope   . TIA (transient ischemic attack) 2010; 09/2011   denies residual on 03/11/2015  . Type II diabetes mellitus ([place])    Past Surgical History:       Past Surgical History:  Procedure Laterality Date  . AORTIC ARCH ANGIOGRAPHY N/A 06/22/2016   Procedure: Aortic Arch Angiography; Surgeon: [name], MD; Location: [place] [facility]; Service: Cardiovascular; Laterality: N/A;  . CAROTID ANGIOGRAPHY N/A 06/22/2016   Procedure: Carotid Angiography; Surgeon: [name], MD; Location: [place] [facility]; Service: Cardiovascular; Laterality: N/A;  . DILATION AND CURETTAGE OF UTERUS    . ENDARTERECTOMY Left 07/14/2016   Procedure: ENDARTERECTOMY CAROTID LEFT; Surgeon: Elam Dutch, MD; Location: [facility]; Service: Vascular; Laterality: Left;  . FEMORAL ARTERY STENT    . LEFT HEART CATH AND CORONARY ANGIOGRAPHY N/A 06/22/2016   Procedure: Left Heart Cath and Coronary Angiography; Surgeon: [name], MD; Location: [place] [facility]; Service: Cardiovascular; Laterality: N/A;  . LOWER EXTREMITY ANGIOGRAM N/A 06/05/2012   Procedure: LOWER EXTREMITY ANGIOGRAM; Surgeon: [name], MD; Location[facility] C[facility]; Service: Cardiovascular; Laterality: N/A;  . LOWER EXTREMITY ANGIOGRAM N/A 07/11/2012   Procedure: LOWER EXTREMITY ANGIOGRAM; Surgeon: [name], MD; Location: [facility] CATH LAB; Service: Cardiovascular; Laterality: N/A;  . LOWER EXTREMITY ANGIOGRAM N/A 09/19/2012   Procedure: LOWER EXTREMITY ANGIOGRAM; Surgeon: [name], MD; Location: [facility] CATH LAB; Service: Cardiovascular; Laterality: N/A;  .  PATCH ANGIOPLASTY Left 07/14/2016   Procedure: PATCH ANGIOPLASTY; Surgeon: Elam Dutch, MD; Location: [place]; Service: Vascular; Laterality: Left;  . PERIPHERAL VASCULAR CATHETERIZATION N/A 03/11/2015   Procedure: Renal Angiography; Surgeon: [name], MD; Location: [place] [facility]; Service: Cardiovascular; Laterality: N/A;  . RENAL ANGIOGRAM Left 06/05/2012   Procedure: RENAL ANGIOGRAM; Surgeon: [name], MD; Location: [facility] CATH LAB; Service: Cardiovascular; Laterality: Left;  . RENAL ANGIOGRAPHY  06/22/2016   Procedure: Renal Angiography; Surgeon: [name], MD; Location: [place] [facility]; Service: Cardiovascular;;  . RENAL ARTERY ANGIOPLASTY Right 03/11/2015  . TUBAL LIGATION    . VAGINAL HYSTERECTOMY     Allergies:      Allergies  Allergen Reactions  . No Known Allergies    Social History:  reports that she quit smoking about 41 years ago. Her smoking use included Cigarettes. She started smoking about 51 years ago. She has a 15.00 pack-year smoking history. She has never used smokeless tobacco. She reports that she drinks alcohol. She reports that she does not use drugs.   Family History:  History reviewed. No pertinent family history.   Physical Exam:        Vitals:   07/19/16 1935 07/19/16 2236 07/20/16 0358 07/20/16 0807  BP: 112/73 (!) 142/44 (!) 135/44 137/66  Pulse: (!) 53 67 64 68  Resp: (!) 29 17 16  (!) 24  Temp: 99 F (37.2 C) 99 F (37.2 C) 98.9 F (37.2 C) 99.1 F (37.3 C)  TempSrc: Oral Oral Oral Oral  SpO2: 97% 97% 94% 97%  Weight:      Height:       Constitutional: Appearance, Alert and awake, oriented x3, not in any acute distress.  Eyes: PERLA, EOMI, irises appear normal, anicteric sclera,  ENMT: external ears and nose appear normal, normal hearing or hard of hearing. Lips appears normal, oropharynx mucosa, tongue, posterior pharynx appear normal  Neck: neck appears normal, no masses, normal ROM, no thyromegaly, no JVD, left sided incision is healing without signs of infection  CVS: S1-S2 clear, no murmur rubs or gallops, no LE edema, normal pedal pulses  Respiratory: diminished BS at the right, no wheezing, rales or rhonchi. Respiratory effort normal. No accessory muscle use.  Abdomen:  soft nontender, nondistended, normal bowel sounds, no hepatosplenomegaly, no hernias  Musculoskeletal:  no cyanosis, clubbing or edema noted bilaterally, no joint contractures or muscle atrophy  Neuro: Cranial nerves II-XII intact, strength, sensation, reflexes  Psych: judgement and insight appear normal, stable mood and affect, mental status  Skin: no rashes or lesions or ulcers, no induration or nodules   GFR  Estimated Creatinine Clearance: 35.5 mL/min (A) (by C-G formula based on SCr of 1.56 mg/dL (H)).          Recent Results (from the past 240 hour(s))  Urine culture Status: Abnormal   Collection Time: 07/18/16 2:31 AM  Result Value Ref Range Status   Specimen Description URINE, RANDOM  Final   Special Requests NONE  Final   Culture (A)  Final    <10,000 COLONIES/mL INSIGNIFICANT GROWTH  Performed at [facility], [address] [place], [place] [address]    Report Status 07/19/2016 FINAL  Final  Culture, blood (routine x 2) Status: None (Preliminary result)   Collection Time: 07/18/16 4:52 PM  Result Value Ref Range Status   Specimen Description BLOOD LEFT ANTECUBITAL  Final   Special Requests   Final    BOTTLES DRAWN AEROBIC AND ANAEROBIC Blood  Culture adequate volume   Culture NO GROWTH 2 DAYS  Final   Report Status PENDING  Incomplete  Culture, blood (routine x 2) Status: None (Preliminary result)   Collection Time: 07/18/16 5:23 PM  Result Value Ref Range Status   Specimen Description BLOOD RIGHT ANTECUBITAL  Final   Special Requests IN PEDIATRIC BOTTLE Blood Culture adequate volume  Final   Culture NO GROWTH 2 DAYS  Final   Report Status PENDING  Incomplete   Inpatient Medications:  Scheduled Meds:  . aspirin EC 81 mg Oral QHS  . atorvastatin 80 mg Oral q1800  . clopidogrel 75 mg Oral QHS  . ezetimibe 10 mg Oral QPM  . Ferrous Fumarate 1 tablet Oral TID WC  . heparin 5,000 Units Subcutaneous Q8H  . insulin aspart 0-15 Units Subcutaneous TID WC  .  prednisoLONE acetate 1 drop Left Eye QID   Continuous Infusions:  . cefTRIAXone (ROCEPHIN) IV Stopped (07/19/16 1810)   Radiological Exams on Admission:  Imaging Results (Last 48 hours)     Impression/Recommendations   Principal Problem:  Altered mental status  Active Problems:  Diabetes mellitus ([place])  Hyperlipidemia  ANEMIA, IRON DEFICIENCY  HTN (hypertension)  PERIPHERAL VASCULAR DISEASE  CKD (chronic kidney disease), stage III   Altered mental status - multifactorial, TIA/CVA post CEA vs. metabolic encephalopathy secondary to hypoxia associated with profound anemia and/or underlying infection or transitory cardiac ischemia  Will obtain blood cultures, brain MRI without contrast, cycle troponin to assure return to normal, repeat UA, lactic acid Will ask PT to evaluate patient  HCAP - repeat chest Xray revealed right base opacity c/w pneumonia Will change antibiotic to Cefepime and Vancomycin   Anemia - chronic Iron deficiency  Patient is Jehovah's witness and does not accept blood transfusions  Will start IV Ferrlicet as her Iron level is low  Will obtain fresh Hgb /Hct next morning, but avoid frequent check ups to prevent worsening associated with phlebotomy   Acute on chronic CKD stage III  Baseline creatinine 1.35-1.40  Today creatinine is 1.56  Will give gentle hydration given CM with EF 40-45% by recent cath on 06/22/2016   Diabetes Mellitus type II - continue   Hyperlipidemia - continue Lipitor and Zetia  HTN - continue current medications and adjust doses as needed   Thank you for this consultation.  Our [facility] hospitalist team will follow the patient with you.    Time Spent: 75 minutes   York Grice M.D.  [facility]  07/20/2016, 11:20 AM

## 2016-07-20 NOTE — Progress Notes (Signed)
Notified NP of patient's accelerated junctional rhythm. Will continue to monitor.

## 2016-07-20 NOTE — Progress Notes (Signed)
Subjective:  Feels better, nursing notice that  She is having difficulty in swallowing capsules, lack of apatite and still weak in her legs. Patient feels fatigued and in general weak. Daughter tells me she gets confused at times. BS have been good (no hypoglycemia) Had no recurrence of fever since yesterday afternoon.  Objective:  Vital Signs in the last 24 hours: Temp:  [98.8 F (37.1 C)-101.1 F (38.4 C)] 99 F (37.2 C) (05/15 1100) Pulse Rate:  [53-78] 65 (05/15 1100) Resp:  [16-29] 27 (05/15 1100) BP: (109-142)/(44-79) 109/69 (05/15 1100) SpO2:  [94 %-97 %] 97 % (05/15 1100)  Intake/Output from previous day: 05/14 0701 - 05/15 0700 In: 410 [P.O.:360; IV Piggyback:50] Out: 500 [Urine:500]  Physical Exam:  General appearance: alert, cooperative, appears stated age, no distress and mildly obese Eyes: negative findings: lids and lashes normal, conjunctivae and sclerae normal and normal eyeball movement Neck: no adenopathy, no JVD, supple, symmetrical, trachea midline, thyroid not enlarged, symmetric, no tenderness/mass/nodules and Left carotid endarterectomy scar appears healthy. Bilateral carotid bruit present. Neck: JVP - normal, carotids 2+= without bruits Resp: clear to auscultation bilaterally Chest wall: no tenderness Cardio: regular rate and rhythm, S1, S2 normal, no murmur, click, rub or gallop GI: soft, non-tender; bowel sounds normal; no masses,  no organomegaly Extremities: extremities normal, atraumatic, no cyanosis or edema Vascular exam: Left carotid endarterectomy site appears healthy without hematoma or discharge. Bilateral femoral bruit present, faint pulses. Absent popliteal and pedal pulses.   Lab Results: BMP  Recent Labs  07/15/16 0424 07/18/16 0024 07/18/16 0031 07/18/16 1651  NA 140 138 140 139  K 3.7 3.6 3.7 3.6  CL 109 105 106 107  CO2 22 23  --  21*  GLUCOSE 103* 95 91 128*  BUN 27* 27* 26* 20  CREATININE 1.44* 1.68* 1.60* 1.56*  CALCIUM  8.4* 8.9  --  8.4*  GFRNONAA 36* 30*  --  32*  GFRAA 41* 34*  --  37*    CBC  Recent Labs Lab 07/18/16 0024  07/18/16 1651  WBC 8.7  --  10.3  RBC 2.24*  --  2.46*  HGB 7.0*  < > 7.7*  HCT 20.8*  < > 22.9*  PLT 109*  --  114*  MCV 92.9  --  93.1  MCH 31.3  --  31.3  MCHC 33.7  --  33.6  RDW 13.3  --  13.5  LYMPHSABS 1.5  --   --   MONOABS 0.5  --   --   EOSABS 0.1  --   --   BASOSABS 0.0  --   --   < > = values in this interval not displayed.  HEMOGLOBIN A1C Lab Results  Component Value Date   HGBA1C 6.3 (H) 07/19/2016   MPG 134 07/19/2016   Lipid Panel     Component Value Date/Time   CHOL 181 10/01/2011 0436   TRIG 120 10/01/2011 0436   HDL 42 10/01/2011 0436   CHOLHDL 4.3 10/01/2011 0436   VLDL 24 10/01/2011 0436   LDLCALC 115 (H) 10/01/2011 0436     Recent Labs  07/01/16 1552 07/18/16 0024  PROT 6.8 6.1*  ALBUMIN 3.1* 2.9*  AST 20 32  ALT 16 17  ALKPHOS 99 86  BILITOT 0.4 0.5    Imaging: Imaging studies have been reviewed.  Cardiac Studies:  EKG 07/17/2016: Normal sinus rhythm at the rate of 64 bpm, left little abnormality, diffuse T-wave inversion, cannot exclude LVH with repolarization abnormality  versus inferior and anterolateral ischemia. Compared to the EKG done on 06/22/2016: T-wave inversions slightly more pronounced but not significantly different.  ECHO: Pending from today.  Assessment/Plan:  1. Altered mental status, etiology unknown at this time, it may be related to microemboli versus reperfusion injury to the brain. CT scan does not reveal any large infarct. 2. Diabetes mellitus with vascular and renal complication, stage III chronic kidney disease 3. Chronic anemia of chronic disease. Microcytic indicis. 4. Hypertension 5. 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 6. Peripheral arterial disease and carotid artery disease, history of recent left carotid endarterectomy.  Recommendation: Patient has  not had any recurrence of fever. Her artery mental status could be related to microemboli versus reperfusion injury in view of very high-grade left carotid artery stenosis.  Would recommend physical therapy, watch out for recurrence of fever, and when clinically stable condition discharged home. I do not feel that she needs to be on antibiotics right now as I do not have a source of infection. I will have the hospitalist program to evaluate her and see if taking over her care would be more appropriate as from cardiac standpoint she remains stable.   [name], M.D. 07/20/2016, 11:42 AM [facility] Pager: [phone] Office: [phone] If no answer: [phone]

## 2016-07-21 ENCOUNTER — Inpatient Hospital Stay (HOSPITAL_COMMUNITY): Payer: PPO

## 2016-07-21 DIAGNOSIS — E119 Type 2 diabetes mellitus without complications: Secondary | ICD-10-CM

## 2016-07-21 DIAGNOSIS — J189 Pneumonia, unspecified organism: Principal | ICD-10-CM

## 2016-07-21 DIAGNOSIS — R4182 Altered mental status, unspecified: Secondary | ICD-10-CM

## 2016-07-21 LAB — VAS US CAROTID
LEFT ECA DIAS: -4 cm/s
Left CCA dist dias: -14 cm/s
Left CCA dist sys: -68 cm/s
Left CCA prox dias: 16 cm/s
Left CCA prox sys: 96 cm/s
Left ICA dist dias: -38 cm/s
Left ICA dist sys: -209 cm/s
Left ICA prox dias: -47 cm/s
Left ICA prox sys: -203 cm/s

## 2016-07-21 LAB — URINALYSIS, ROUTINE W REFLEX MICROSCOPIC
Bilirubin Urine: NEGATIVE
Glucose, UA: NEGATIVE mg/dL
Ketones, ur: NEGATIVE mg/dL
Leukocytes, UA: NEGATIVE
Nitrite: NEGATIVE
Protein, ur: 100 mg/dL — AB
SPECIFIC GRAVITY, URINE: 1.011 (ref 1.005–1.030)
pH: 6 (ref 5.0–8.0)

## 2016-07-21 LAB — GLUCOSE, CAPILLARY
GLUCOSE-CAPILLARY: 163 mg/dL — AB (ref 65–99)
GLUCOSE-CAPILLARY: 148 mg/dL — AB (ref 65–99)
Glucose-Capillary: 145 mg/dL — ABNORMAL HIGH (ref 65–99)

## 2016-07-21 LAB — BASIC METABOLIC PANEL
Anion gap: 8 (ref 5–15)
BUN: 31 mg/dL — AB (ref 6–20)
CO2: 20 mmol/L — AB (ref 22–32)
CREATININE: 1.86 mg/dL — AB (ref 0.44–1.00)
Calcium: 8.3 mg/dL — ABNORMAL LOW (ref 8.9–10.3)
Chloride: 107 mmol/L (ref 101–111)
GFR calc Af Amer: 30 mL/min — ABNORMAL LOW (ref >60)
GFR calc non Af Amer: 26 mL/min — ABNORMAL LOW (ref >60)
Glucose, Bld: 217 mg/dL — ABNORMAL HIGH (ref 65–99)
Potassium: 3.9 mmol/L (ref 3.5–5.1)
SODIUM: 135 mmol/L (ref 135–145)

## 2016-07-21 LAB — VITAMIN B12: Vitamin B-12: 533 pg/mL (ref 180–914)

## 2016-07-21 MED ORDER — FERROUS FUMARATE 324 (106 FE) MG PO TABS
1.0000 | ORAL_TABLET | Freq: Three times a day (TID) | ORAL | Status: DC
  Administered 2016-07-23 – 2016-07-24 (×4): 106 mg via ORAL
  Filled 2016-07-21 (×7): qty 1

## 2016-07-21 NOTE — Progress Notes (Signed)
*  PRELIMINARY RESULTS* Vascular Ultrasound Carotid Duplex (Doppler) has been completed.  Preliminary findings: Left: Proximal ICA is patent with velocities in normal limits. Mid ICA is tortuous with protruding plaque/ echogenic focus and elevated velocities in the high 40-59% range. (velocities in mid ICA are increased compared to previous study 05/2016)   Landry Mellow, [place], RVT  07/21/2016, 2:13 PM

## 2016-07-21 NOTE — Progress Notes (Addendum)
Progress Note    07/21/2016 8:12 AM 7 Days Post-Op  Subjective:  Pt says she feels better this morning.  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  Denies any trouble swallowing.  Low grade fever 99-99.2 past 24 hours HR 60's-70's NSR/junctional 174'B-449'Q systolic 75% RA  Vitals:   07/21/16 0244 07/21/16 0805  BP: (!) 149/83 (!) 106/54  Pulse: 66 63  Resp: 19 20  Temp: 99.2 F (37.3 C) 99 F (37.2 C)    Physical Exam: Cardiac:  regular Lungs:  Non labored Incisions:  Clean and dry Extremities:  Moving all extremities equally Neuro:  In tact; tongue midline  CBC    Component Value Date/Time   WBC 10.3 07/18/2016 1651   RBC 2.46 (L) 07/18/2016 1651   HGB 7.7 (L) 07/18/2016 1651   HCT 22.9 (L) 07/18/2016 1651   PLT 114 (L) 07/18/2016 1651   MCV 93.1 07/18/2016 1651   MCH 31.3 07/18/2016 1651   MCHC 33.6 07/18/2016 1651   RDW 13.5 07/18/2016 1651   LYMPHSABS 1.5 07/18/2016 0024   MONOABS 0.5 07/18/2016 0024   EOSABS 0.1 07/18/2016 0024   BASOSABS 0.0 07/18/2016 0024    BMET    Component Value Date/Time   NA 135 07/21/2016 0241   K 3.9 07/21/2016 0241   CL 107 07/21/2016 0241   CO2 20 (L) 07/21/2016 0241   GLUCOSE 217 (H) 07/21/2016 0241   BUN 31 (H) 07/21/2016 0241   CREATININE 1.86 (H) 07/21/2016 0241   CALCIUM 8.3 (L) 07/21/2016 0241   GFRNONAA 26 (L) 07/21/2016 0241   GFRAA 30 (L) 07/21/2016 0241    INR    Component Value Date/Time   INR 0.98 07/01/2016 1552     Intake/Output Summary (Last 24 hours) at 07/21/16 0812 Last data filed at 07/21/16 0303  Gross per 24 hour  Intake             2490 ml  Output                0 ml  Net             2490 ml   MRI Brain 07/20/16: IMPRESSION: Numerous acute supra- and infratentorial subcentimeter infarcts suggests in embolic phenomena or, sequela of patient's recent cardiogenic shock.  Increasing chronic appearing micro hemorrhages associated  with chronic hypertension.  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   Assessment:  71 y.o. female is s/p:  Left CEA 07/14/16 and discharged on 07/15/16 and readmitted on 07/17/16 for AMS and EKG changes. 7 Days Post-Op  Plan: -pt alert this morning and oriented to year/place.  Neuro intact. -low grade fever past 24hrs-incision looks fine.  WBC normal but trended up slightly Pt on Vancomycin & Maxipime -creatinine up from yesterday (no UOP recorded).  PT on ARB and Metformin at home, but these are not ordered here in the hospital -anemia up slightly - stable.  Per RN, family is concerned about the blood draws given pt is Jehovah's Witness. -DVT prophylaxis:  SQ heparin -carotid duplex ordered yesterday    Leontine Locket, PA-C [facility] [phone] 07/21/2016 8:12 AM  Less confused today. Some nausea with iron pills.  MRI consistent with reperfusion injury. Unsure of fever etiology but most likely atelectasis since work up not really showing anything Would avoid all blood draws if possible to prevent worsening anemia She is going to check to see if  she can take EPO. Best treatment for reperfusion injury is blood pressure control would keep SBP 100-160.  Would only measure BP in left arm as right will be artificially low due to right subclavian stenosis.  [name], MD [facility] [place] Office: [phone] Pager: [phone]

## 2016-07-21 NOTE — Progress Notes (Signed)
PROGRESS NOTE    Yvonne Patel  FWY:[id number] DOB: 05-11-1945 DOA: 07/17/2016 PCP: Kelton Pillar, MD   Brief Narrative:   71 y.o. female with h/o HTN, hyperlipidemia, CAD, PAD with carotid arteries involvement - s/p recent left CEA (07/14/2016), DM type II, TIA, CKD stage III who was admitted to the [facility] on 07/18/2016 with c/o headache, weakness, mental status changes with confusion on 07/18/2016  Since her CEA patient developed progressive global weakness and became less conversational than usual, had slurred speech and complained of constant headache and poor appetite.   Assessment & Plan:   Principal Problem:   Altered mental status Active Problems:   Diabetes mellitus (HCC)   Hyperlipidemia   ANEMIA, IRON DEFICIENCY   HTN (hypertension)   PERIPHERAL VASCULAR DISEASE   CKD (chronic kidney disease), stage III   Diabetes mellitus with complication (HCC)   Elevated troponin  Altered Mental Status; improved - likely in the setting of CVA/Reperfusion injury vs underlying infection  -cont currently Abx for now as she is doing better -cont neurochecks  -MRI of the brain is consistent = acute supra/infratentorial subcm infarcts. Increasing chronic appearing Micro hemorrhages  - CT head - neg for any acute changes  -Cont ASA, Plavix and statin.  -Order PT/OT  Mild Dysphagia per patient  -will order Speech and swallow eva.   HCAP -Cultures are neg thus far.  -2 view CXR 5/15 - developing opacity in right perihilar region and right base - possible infiltrate with mild cardiomegaly with edema.   Elevated troponins -stable per cardiology, no further work up recommended. Echo from 5/15 shows ef 45-50%, mod hypokinesis, grade 1 DD. Severely dilated left atrium.  Anemia -likely from chronic disease, no active signs of blood loss.  -Iron studies already done - Cont Hemocyte, minimize blood draws. Will check b12 and folate (will see if we can add it to the previous draw)  Acute on CKD  stage III -avoid nephrotoxic drugs, likely from transient injury. Will cont to monitor along with urine output.   DM2 -acccuchecks, ISS  HLD -on statin   HTN -monitor for now   DVT prophylaxis: Heparin  Code Status: Full  Family Communication:  Daughter at bedside.  Disposition Plan: SDU for now.   Consultants:   Vascular surgery   Cardiology   Procedures:   None this admission   Antimicrobials:   Rocephine 5/13 > 5/15  Cefepime 5/15 >  Vanc 5/15 >   Subjective: States she is mentally doing better except she feels like she is having some trouble swallowing pills.  Per daughter who is at the bedside states mentally she appears back to her baseline.   Objective: Vitals:   07/20/16 2318 07/21/16 0244 07/21/16 0620 07/21/16 0805  BP: (!) 108/55 (!) 149/83  (!) 106/54  Pulse: 73 66  63  Resp: (!) 28 19  20   Temp: 99.2 F (37.3 C) 99.2 F (37.3 C)  99 F (37.2 C)  TempSrc: Oral Oral  Oral  SpO2: 97% 97%  93%  Weight:   85.4 kg (188 lb 4.4 oz)   Height:        Intake/Output Summary (Last 24 hours) at 07/21/16 1015 Last data filed at 07/21/16 0303  Gross per 24 hour  Intake             2250 ml  Output                0 ml  Net  2250 ml   Filed Weights   07/18/16 0410 07/19/16 0500 07/21/16 0620  Weight: 84.4 kg (186 lb 1.1 oz) 84.6 kg (186 lb 8.2 oz) 85.4 kg (188 lb 4.4 oz)    Examination:  General exam: Appears calm and comfortable  Respiratory system: Clear to auscultation. Respiratory effort normal. Cardiovascular system: S1 & S2 heard, RRR. No JVD, murmurs, rubs, gallops or clicks. No pedal edema. Gastrointestinal system: Abdomen is nondistended, soft and nontender. No organomegaly or masses felt. Normal bowel sounds heard. Central nervous system: Alert and oriented. No focal neurological deficits. Extremities: Symmetric 5 x 5 power. Skin: No rashes, lesions or ulcers Psychiatry: Judgement and insight appear normal. Mood & affect  appropriate.     Data Reviewed:   CBC:  Recent Labs Lab 07/14/16 1600 07/15/16 0424 07/18/16 0024 07/18/16 0031 07/18/16 1651  WBC 9.3 8.4 8.7  --  10.3  NEUTROABS  --   --  6.7  --   --   HGB 9.2* 8.1* 7.0* 7.5* 7.7*  HCT 27.9* 24.6* 20.8* 22.0* 22.9*  MCV 90.9 91.8 92.9  --  93.1  PLT 125* 109* 109*  --  762*   Basic Metabolic Panel:  Recent Labs Lab 07/15/16 0424 07/18/16 0024 07/18/16 0031 07/18/16 1651 07/21/16 0241  NA 140 138 140 139 135  K 3.7 3.6 3.7 3.6 3.9  CL 109 105 106 107 107  CO2 22 23  --  21* 20*  GLUCOSE 103* 95 91 128* 217*  BUN 27* 27* 26* 20 31*  CREATININE 1.44* 1.68* 1.60* 1.56* 1.86*  CALCIUM 8.4* 8.9  --  8.4* 8.3*   GFR: Estimated Creatinine Clearance: 30 mL/min (A) (by C-G formula based on SCr of 1.86 mg/dL (H)). Liver Function Tests:  Recent Labs Lab 07/18/16 0024  AST 32  ALT 17  ALKPHOS 86  BILITOT 0.5  PROT 6.1*  ALBUMIN 2.9*   No results for input(s): LIPASE, AMYLASE in the last 168 hours.  Recent Labs Lab 07/18/16 0024  AMMONIA 26   Coagulation Profile: No results for input(s): INR, PROTIME in the last 168 hours. Cardiac Enzymes: No results for input(s): CKTOTAL, CKMB, CKMBINDEX, TROPONINI in the last 168 hours. BNP (last 3 results) No results for input(s): PROBNP in the last 8760 hours. HbA1C:  Recent Labs  07/19/16 0231  HGBA1C 6.3*   CBG:  Recent Labs Lab 07/20/16 0806 07/20/16 1247 07/20/16 1628 07/20/16 2112 07/21/16 0803  GLUCAP 126* 128* 109* 204* 145*   Lipid Profile: No results for input(s): CHOL, HDL, LDLCALC, TRIG, CHOLHDL, LDLDIRECT in the last 72 hours. Thyroid Function Tests: No results for input(s): TSH, T4TOTAL, FREET4, T3FREE, THYROIDAB in the last 72 hours. Anemia Panel: No results for input(s): VITAMINB12, FOLATE, FERRITIN, TIBC, IRON, RETICCTPCT in the last 72 hours. Sepsis Labs:  Recent Labs Lab 07/18/16 0033 07/19/16 0231 07/20/16 1839  PROCALCITON  --   --  0.32    LATICACIDVEN 2.83* 1.0 2.0*    Recent Results (from the past 240 hour(s))  Urine culture     Status: Abnormal   Collection Time: 07/18/16  2:31 AM  Result Value Ref Range Status   Specimen Description URINE, RANDOM  Final   Special Requests NONE  Final   Culture (A)  Final    <10,000 COLONIES/mL INSIGNIFICANT GROWTH Performed at [facility], [address] [place], [place] [address]    Report Status 07/19/2016 FINAL  Final  Culture, blood (routine x 2)     Status:  None (Preliminary result)   Collection Time: 07/18/16  4:52 PM  Result Value Ref Range Status   Specimen Description BLOOD LEFT ANTECUBITAL  Final   Special Requests   Final    BOTTLES DRAWN AEROBIC AND ANAEROBIC Blood Culture adequate volume   Culture NO GROWTH 2 DAYS  Final   Report Status PENDING  Incomplete  Culture, blood (routine x 2)     Status: None (Preliminary result)   Collection Time: 07/18/16  5:23 PM  Result Value Ref Range Status   Specimen Description BLOOD RIGHT ANTECUBITAL  Final   Special Requests IN PEDIATRIC BOTTLE Blood Culture adequate volume  Final   Culture NO GROWTH 2 DAYS  Final   Report Status PENDING  Incomplete         Radiology Studies: Dg Chest 2 View  Result Date: 07/20/2016 CLINICAL DATA:  Fever.  Postop. EXAM: CHEST  2 VIEW COMPARISON:  Jul 18, 2016 FINDINGS: No pneumothorax. Cardiomegaly. Diffuse interstitial opacities suggests mild edema. More focal opacity seen in the right perihilar region and the right base could represent developing infiltrate. Tiny effusions. No other acute abnormalities. IMPRESSION: 1. Developing opacities in the right perihilar region and right base could represent developing infiltrate. Recommend clinical correlation and attention on follow-up. 2. Cardiomegaly and mild edema. Electronically Signed   By: Dorise Bullion III M.D   On: 07/20/2016 12:33   Mr Brain Wo Contrast  Result Date: 07/20/2016 CLINICAL DATA:  Altered mental status. Recent  cardiogenic shock. History of hypertension, diabetes, hyperlipidemia. EXAM: MRI HEAD WITHOUT CONTRAST TECHNIQUE: Multiplanar, multiecho pulse sequences of the brain and surrounding structures were obtained without intravenous contrast. COMPARISON:  CT HEAD Jul 18, 2016 and MRI of the head September 30, 2011 FINDINGS: BRAIN: Numerous subcentimeter supratentorial white matter and and single LEFT cerebellum subcentimeter foci of reduced diffusion with low ADC values. Increased number of chronic appearing micro hemorrhages associated with chronic hypertension. 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 Patchy to confluent supratentorial white matter and to lesser extent pontine T2 hyperintensities. VASCULAR: Normal major intracranial vascular flow voids present at skull base. SKULL AND UPPER CERVICAL SPINE: No abnormal sellar expansion. No suspicious calvarial bone marrow signal. Craniocervical junction maintained. SINUSES/ORBITS: The mastoid air-cells and included paranasal sinuses are well-aerated. Bilateral concha lamella. The included ocular globes and orbital contents are non-suspicious. Status post bilateral ocular lens implants. OTHER: Patient is edentulous. IMPRESSION: Numerous acute supra- and infratentorial subcentimeter infarcts suggests in embolic phenomena or, sequela of patient's recent cardiogenic shock. Increasing chronic appearing micro hemorrhages associated with chronic hypertension. 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 Electronically Signed   By: Elon Alas M.D.   On: 07/20/2016 22:19        Scheduled Meds: . aspirin EC  81 mg Oral QHS  . atorvastatin  80 mg Oral q1800  . clopidogrel  75 mg Oral QHS  . ezetimibe  10 mg Oral QPM  . [START ON 07/23/2016] Ferrous Fumarate  1 tablet Oral TID WC  . heparin  5,000 Units Subcutaneous Q8H  . insulin aspart  0-15 Units Subcutaneous TID WC  . prednisoLONE acetate  1 drop Left Eye QID   Continuous  Infusions: . ceFEPime (MAXIPIME) IV Stopped (07/20/16 1547)  . ferric gluconate (FERRLECIT/NULECIT) IV Stopped (07/20/16 1508)  . vancomycin       LOS: 3 days    Time spent: 35 mins     Ankit Arsenio Loader, MD [facility] Pager [phone]   If 7PM-7AM,  please contact night-coverage [email or web address] Password [email or web address] 07/21/2016, 10:15 AM

## 2016-07-21 NOTE — Progress Notes (Signed)
Patient and family members made RN aware that patient is okay with receiving erythropoietin. Fields, MD made aware per MD request.

## 2016-07-22 ENCOUNTER — Inpatient Hospital Stay (HOSPITAL_COMMUNITY): Payer: PPO

## 2016-07-22 DIAGNOSIS — R0603 Acute respiratory distress: Secondary | ICD-10-CM

## 2016-07-22 LAB — GLUCOSE, CAPILLARY
GLUCOSE-CAPILLARY: 155 mg/dL — AB (ref 65–99)
GLUCOSE-CAPILLARY: 143 mg/dL — AB (ref 65–99)
Glucose-Capillary: 129 mg/dL — ABNORMAL HIGH (ref 65–99)
Glucose-Capillary: 166 mg/dL — ABNORMAL HIGH (ref 65–99)
Glucose-Capillary: 149 mg/dL — ABNORMAL HIGH (ref 65–99)
Glucose-Capillary: 138 mg/dL — ABNORMAL HIGH (ref 65–99)

## 2016-07-22 LAB — PROCALCITONIN: PROCALCITONIN: 0.36 ng/mL

## 2016-07-22 LAB — CBC
HCT: 22 % — ABNORMAL LOW (ref 36.0–46.0)
Hemoglobin: 7.2 g/dL — ABNORMAL LOW (ref 12.0–15.0)
MCH: 30 pg (ref 26.0–34.0)
MCHC: 32.7 g/dL (ref 30.0–36.0)
MCV: 91.7 fL (ref 78.0–100.0)
Platelets: 178 10*3/uL (ref 150–400)
RBC: 2.4 MIL/uL — ABNORMAL LOW (ref 3.87–5.11)
RDW: 13.7 % (ref 11.5–15.5)
WBC: 11.4 10*3/uL — ABNORMAL HIGH (ref 4.0–10.5)

## 2016-07-22 LAB — FOLATE RBC
FOLATE, RBC: 1129 ng/mL (ref >498)
Folate, Hemolysate: 252.8 ng/mL
Hematocrit: 22.4 % — ABNORMAL LOW (ref 34.0–46.6)

## 2016-07-22 LAB — TROPONIN I
Troponin I: 0.73 ng/mL (ref <0.03)
Troponin I: 0.67 ng/mL (ref <0.03)
Troponin I: 0.49 ng/mL (ref <0.03)

## 2016-07-22 MED ORDER — AMOXICILLIN-POT CLAVULANATE 875-125 MG PO TABS
1.0000 | ORAL_TABLET | Freq: Two times a day (BID) | ORAL | Status: DC
  Administered 2016-07-22 – 2016-07-24 (×5): 1 via ORAL
  Filled 2016-07-22 (×6): qty 1

## 2016-07-22 MED ORDER — DARBEPOETIN ALFA 25 MCG/0.42ML IJ SOSY
25.0000 ug | PREFILLED_SYRINGE | INTRAMUSCULAR | Status: DC
  Administered 2016-07-22: 25 ug via SUBCUTANEOUS
  Filled 2016-07-22: qty 0.42

## 2016-07-22 MED ORDER — FUROSEMIDE 10 MG/ML IJ SOLN
40.0000 mg | Freq: Two times a day (BID) | INTRAMUSCULAR | Status: AC
  Administered 2016-07-22 (×2): 40 mg via INTRAVENOUS
  Filled 2016-07-22 (×2): qty 4

## 2016-07-22 NOTE — Evaluation (Signed)
Clinical/Bedside Swallow Evaluation Patient Details  Name: Yvonne Patel MRN: [id number] Date of Birth: 10/02/1945  Today's Date: 07/22/2016 Time: SLP Start Time (ACUTE ONLY): 0820 SLP Stop Time (ACUTE ONLY): 0834 SLP Time Calculation (min) (ACUTE ONLY): 14 min  Past Medical History:  Past Medical History:  Diagnosis Date  . Cardiomyopathy ([place])   . Coronary artery disease   . Hyperlipidemia   . Hypertension   . Neuromuscular disorder (HCC)    neuropathy  . PAD (peripheral artery disease) ([place])   . Peripheral vascular disease ([place])   . Refusal of blood transfusions as patient is Jehovah's Witness   . Retinal disease   . Syncope   . TIA (transient ischemic attack) 2010; 09/2011   denies residual on 03/11/2015  . Type II diabetes mellitus ([place])    Past Surgical History:  Past Surgical History:  Procedure Laterality Date  . AORTIC ARCH ANGIOGRAPHY N/A 06/22/2016   Procedure: Aortic Arch Angiography;  Surgeon: [name], MD;  Location: [place] [facility];  Service: Cardiovascular;  Laterality: N/A;  . CAROTID ANGIOGRAPHY N/A 06/22/2016   Procedure: Carotid Angiography;  Surgeon: [name], MD;  Location: [place] [facility];  Service: Cardiovascular;  Laterality: N/A;  . DILATION AND CURETTAGE OF UTERUS    . ENDARTERECTOMY Left 07/14/2016   Procedure: ENDARTERECTOMY CAROTID LEFT;  Surgeon: Elam Dutch, MD;  Location: [place];  Service: Vascular;  Laterality: Left;  . FEMORAL ARTERY STENT    . LEFT HEART CATH AND CORONARY ANGIOGRAPHY N/A 06/22/2016   Procedure: Left Heart Cath and Coronary Angiography;  Surgeon: [name], MD;  Location: [place] [facility];  Service: Cardiovascular;  Laterality: N/A;  . LOWER EXTREMITY ANGIOGRAM N/A 06/05/2012   Procedure: LOWER EXTREMITY ANGIOGRAM;  Surgeon: [name], MD;  Location: [facility] C[facility] LAB;  Service: Cardiovascular;  Laterality: N/A;  . LOWER EXTREMITY ANGIOGRAM N/A 07/11/2012   Procedure: LOWER EXTREMITY ANGIOGRAM;  Surgeon: [name], MD;  Location: [facility] LAB;  Service: Cardiovascular;  Laterality: N/A;  . LOWER EXTREMITY ANGIOGRAM N/A 09/19/2012   Procedure: LOWER EXTREMITY ANGIOGRAM;  Surgeon: [name], MD;  Location: [facility] CATH LAB;  Service: Cardiovascular;  Laterality: N/A;  . PATCH ANGIOPLASTY Left 07/14/2016   Procedure: PATCH ANGIOPLASTY;  Surgeon: Elam Dutch, MD;  Location: [place];  Service: Vascular;  Laterality: Left;  . PERIPHERAL VASCULAR CATHETERIZATION N/A 03/11/2015   Procedure: Renal Angiography;  Surgeon: [name], MD;  Location: [place] [facility];  Service: Cardiovascular;  Laterality: N/A;  . RENAL ANGIOGRAM Left 06/05/2012   Procedure: RENAL ANGIOGRAM;  Surgeon: [name], MD;  Location: [facility] CATH LAB;  Service: Cardiovascular;  Laterality: Left;  . RENAL ANGIOGRAPHY  06/22/2016   Procedure: Renal Angiography;  Surgeon: [name], MD;  Location: [place] [facility];  Service: Cardiovascular;;  . RENAL ARTERY ANGIOPLASTY Right 03/11/2015  . TUBAL LIGATION    . VAGINAL HYSTERECTOMY     HPI:  71 y.o.femalewith h/o HTN, hyperlipidemia, CAD, PAD with carotid arteries involvement - s/p recent leftCEA (07/14/2016), DM type II, TIA, CKD stage III who was admitted to the [facility] on 07/18/2016 with c/o headache, weakness, mental status changes with confusion on 07/18/2016. Since her CEA patient developed progressive global weakness and became less conversational than usual, had slurred speech and complained of constant headache and poor appetite. MRI shows small scattered infarcts, suggestive of emboli. Pt had difficulty taking iron pill.    Assessment / Plan / Recommendation  Clinical Impression  Other than early satiety, pt denies difficulty swallowing and demonstrates no impairment under subjective observation. Denies any difficulty after CEA. Recommend pt continue current diet. No SLP f/u needed.  SLP Visit Diagnosis: Dysphagia, unspecified (R13.10)    Aspiration Risk  Mild aspiration risk     Diet Recommendation Regular;Thin liquid   Liquid Administration via: Cup;Straw Medication Administration: Whole meds with liquid Supervision: Patient able to self feed Postural Changes: Seated upright at 90 degrees    Other  Recommendations Oral Care Recommendations: Oral care BID   Follow up Recommendations None      Frequency and Duration            Prognosis        Swallow Study   General HPI: 71 y.o.femalewith h/o HTN, hyperlipidemia, CAD, PAD with carotid arteries involvement - s/p recent leftCEA (07/14/2016), DM type II, TIA, CKD stage III who was admitted to the [facility] on 07/18/2016 with c/o headache, weakness, mental status changes with confusion on 07/18/2016. Since her CEA patient developed progressive global weakness and became less conversational than usual, had slurred speech and complained of constant headache and poor appetite. MRI shows small scattered infarcts, suggestive of emboli. Pt had difficulty taking iron pill.  Type of Study: Bedside Swallow Evaluation Diet Prior to this Study: Regular;Thin liquids Temperature Spikes Noted: No Respiratory Status: Room air History of Recent Intubation: No Behavior/Cognition: Alert;Cooperative;Pleasant mood Oral Cavity Assessment: Within Functional Limits Oral Care Completed by SLP: No Oral Cavity - Dentition: Edentulous Vision: Functional for self-feeding Self-Feeding Abilities: Able to feed self Patient Positioning: Upright in chair Baseline Vocal Quality: Normal Volitional Cough: Strong Volitional Swallow: Able to elicit    Oral/Motor/Sensory Function Overall Oral Motor/Sensory Function: Within functional limits   Ice Chips     Thin Liquid Thin Liquid: Within functional limits    Nectar Thick Nectar Thick Liquid: Not tested   Honey Thick Honey Thick Liquid: Not tested   Puree Puree: Within functional limits   Solid   GO   Solid: Within functional limits       [facility], MA CCC-SLP [phone]  Lynann Beaver 07/22/2016,10:04 AM

## 2016-07-22 NOTE — Progress Notes (Signed)
Patient transferred to [employer]. Alert and oriented. Family at bedside. Oriented to room. Call bell in reach. No new complaints. Patient mentions right hand remains sore. Elevated on pillow. Ice pack prn.

## 2016-07-22 NOTE — Care Management Important Message (Signed)
Important Message  Patient Details  Name: Yvonne Patel MRN: [id number] Date of Birth: February 25, 1946   Medicare Important Message Given:  Yes    Nathen May 07/22/2016, 11:28 AM

## 2016-07-22 NOTE — Progress Notes (Addendum)
[facility] [place]  Subjective  - Short of breath last night.  Now on O2 2 L SAT 99%.   Objective (!) 143/55 67 98.7 F (37.1 C) (Oral) (!) 22 92%  Intake/Output Summary (Last 24 hours) at 07/22/16 0742 Last data filed at 07/22/16 0718  Gross per 24 hour  Intake             1130 ml  Output             1400 ml  Net             -270 ml    Heart RR Lungs non labored on 2L O 2 SAT 99% Palpable radial pulse B, Active range of motion all 4 extremities A & O x 3  Assessment/Planning: MRI Brain 07/20/16: IMPRESSION: Numerous acute supra- and infratentorial subcentimeter infarcts suggests in embolic phenomena or, sequela of patient's recent cardiogenic shock.  Increasing chronic appearing micro hemorrhages associated with chronic hypertension.  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  07/21/2016 carotid duplex  Summary: High end 40-59% left mid ICA stenosis.  Assessment:  71 y.o. female is s/p:  Left CEA 07/14/16 and discharged on 07/15/16 and readmitted on 07/17/16 for AMS and EKG changes. 8 Days Post-Op  Afebrile this am A & O x 3 last 24 per report from nurses -DVT prophylaxis:  SQ heparin Duplex may represent intimal hyperplasia    COLLINS, EMMA [facility] 07/22/2016 7:42 AM -- Subjectively short of breath.  Desats easily without O2.  Mental status better Will start EPO for anemia approved by pt and family with Jehovah witness Repeat chest xray Check troponin  [name], MD [facility] [place] Office: [phone] Pager: [phone]  Laboratory Lab Results: No results for input(s): WBC, HGB, HCT, PLT in the last 72 hours. BMET  Recent Labs  07/21/16 0241  NA 135  K 3.9  CL 107  CO2 20*  GLUCOSE 217*  BUN 31*  CREATININE 1.86*  CALCIUM 8.3*    COAG Lab Results  Component Value Date   INR 0.98 07/01/2016   INR 1.03 02/17/2009   INR 1.0 11/13/2008    No results found for: PTT

## 2016-07-22 NOTE — Progress Notes (Signed)
CRITICAL VALUE ALERT  Critical value received:  Troponin 0.73  Date of notification: 07/22/16  Time of notification: 0853  Critical value read back: yes  Nurse who received alert:  Phineas Douglas, RN  MD notified (1st page):  Reesa Chew, MD  Time of first page: [phone]  Responding MD:  Reesa Chew, MD    Time MD responded:  [phone] - new orders anticipated from MD

## 2016-07-22 NOTE — Evaluation (Signed)
Physical Therapy Evaluation Patient Details Name: Yvonne Patel MRN: [id number] DOB: 11/09/1945 Today's Date: 07/22/2016   History of Present Illness  Patient is a 71 y/o female who was recently d/ced on 5/9 s/p endarterectomy and readmitted due to AMS, headache, weakness. 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 PMH-HTN, HLD, CAD, PAD with carotid artery involvement, DM type II, TIA, CKD stage III   Clinical Impression  Patient presents with generalized weakness, dyspnea, decreased activity tolerance and impaired mobility s/p above. Per daughter pt is back to cognitive baseline. Pt independent PTA, negotiating stairs and caring for self. Pt now requires Min-Mod A for mobility. Needs to ambulate next session. Pt gets anxious on RA and prefers 02. Will have 24/7 from daughter at d/c. Will follow acutely to maximize independence and mobility prior to return home.     Follow Up Recommendations Home health PT;Supervision for mobility/OOB;Supervision/Assistance - 24 hour    Equipment Recommendations  None recommended by PT    Recommendations for Other Services       Precautions / Restrictions Precautions Precautions: Fall Restrictions Weight Bearing Restrictions: No      Mobility  Bed Mobility Overal bed mobility: Needs Assistance Bed Mobility: Rolling;Sidelying to Sit;Sit to Sidelying Rolling: Min guard Sidelying to sit: HOB elevated;Mod assist     Sit to sidelying: Supervision General bed mobility comments: Pt requires assist to elevate trunk to get to EOB, with use of rail. Laying down x3 without warning due to "feeling weak" All vitals stable. Sp02 read in high 80s but wave form was not correct.  Transfers Overall transfer level: Needs assistance Equipment used: None Transfers: Sit to/from [employer] Sit to Stand: Min assist Stand pivot transfers: Min assist       General transfer comment: Min A to power to standing with cues for hand placement/technique. SPT to chair with Min A for balance.   Ambulation/Gait Ambulation/Gait assistance:  (Declined due to feeling weak. )              Stairs            Wheelchair Mobility    Modified Rankin (Stroke Patients Only)       Balance Overall balance assessment: Needs assistance Sitting-balance support: Feet supported;No upper extremity supported Sitting balance-Leahy Scale: Good     Standing balance support: During functional activity Standing balance-Leahy Scale: Fair Standing balance comment: Requires at least 1 UE support in standing. Reports some dizziness but VSS.                             Pertinent Vitals/Pain Pain Assessment: No/denies pain    Home Living Family/patient expects to be discharged to:: Private residence Living Arrangements: Children;Other relatives Available Help at Discharge: Family;Available 24 hours/day Type of Home: House Home Access: Stairs to enter Entrance Stairs-Rails: Right Entrance Stairs-Number of Steps: 3 Home Layout: Two level;Able to live on main level with bedroom/bathroom Home Equipment: Walker - 4 wheels;Cane - single point      Prior Function Level of Independence: Independent         Comments: Drives, cooks, cleans. Lives with daughter who can be there 24/7. Bedroom is on second level but can live on first level.     Hand Dominance        Extremity/Trunk Assessment  Upper Extremity Assessment Upper Extremity Assessment: Defer to OT evaluation    Lower Extremity Assessment Lower Extremity Assessment: Generalized weakness (during functional tasks but >3+/5 throughout)       Communication   Communication: No difficulties  Cognition Arousal/Alertness: Awake/alert Behavior During Therapy: Anxious (on RA.) Overall Cognitive  Status: Within Functional Limits for tasks assessed Area of Impairment: Problem solving;Following commands                             Problem Solving: Slow processing;Requires verbal cues General Comments: Difficulty describing symptoms but kept laying down without reason and grimacing??  "I feel weak" Per daughter, pt has returned to cognitive baseline.      General Comments General comments (skin integrity, edema, etc.): Daughter present during session. Pt seems to be anxious without supplemental 02 donned but did not wear PTA. Sp02 remained>90% on RA (dropped for a little but wave form not accurate) and pt SOB when laying in supine with MD present, otherwise no SOB noted.    Exercises     Assessment/Plan    PT Assessment Patient needs continued PT services  PT Problem List Decreased strength;Decreased mobility;Decreased activity tolerance;Decreased cognition;Cardiopulmonary status limiting activity;Decreased balance       PT Treatment Interventions Therapeutic activities;Gait training;Therapeutic exercise;Patient/family education;Balance training;Functional mobility training;Stair training;DME instruction    PT Goals (Current goals can be found in the Care Plan section)  Acute Rehab PT Goals Patient Stated Goal: to feel better and go home PT Goal Formulation: With patient Time For Goal Achievement: 08/05/16 Potential to Achieve Goals: Good    Frequency Min 3X/week   Barriers to discharge        Co-evaluation               AM-PAC PT "6 Clicks" Daily Activity  Outcome Measure Difficulty turning over in bed (including adjusting bedclothes, sheets and blankets)?: Total Difficulty moving from lying on back to sitting on the side of the bed? : Total Difficulty sitting down on and standing up from a chair with arms (e.g., wheelchair, bedside commode, etc,.)?: Total Help needed moving to and from a bed to chair (including a wheelchair)?: A Little Help needed  walking in hospital room?: A Little Help needed climbing 3-5 steps with a railing? : A Lot 6 Click Score: 11    End of Session Equipment Utilized During Treatment: Gait belt;Oxygen Activity Tolerance: Patient tolerated treatment well Patient left: in chair;with call bell/phone within reach;with chair alarm set;with family/visitor present Nurse Communication: Mobility status PT Visit Diagnosis: Muscle weakness (generalized) (M62.81);Unsteadiness on feet (R26.81)    Time: 6144-3154 PT Time Calculation (min) (ACUTE ONLY): 24 min   Charges:   PT Evaluation $PT Eval Moderate Complexity: 1 Procedure PT Treatments $Therapeutic Activity: 8-22 mins   PT G Codes:        Wray Kearns, PT, DPT [phone]    Marguarite Arbour A Katya Rolston 07/22/2016, 9:21 AM

## 2016-07-22 NOTE — Progress Notes (Signed)
#   4 S/W [employer]  @ [place] # [place] [employer]  CO-PAY- $ 25.00   [place] #  [phone] FOR BOTH   PREFERRED PROVIDER - YES / [place]

## 2016-07-22 NOTE — Evaluation (Signed)
Occupational Therapy Evaluation Patient Details Name: Yvonne Patel MRN: [id number] DOB: [birth year] Today's Date: 07/22/2016    History of Present Illness Patient is a 71 y/o female who was recently d/ced on 5/9 s/p endarterectomy and readmitted due to AMS, headache, weakness. 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 PMH-HTN, HLD, CAD, PAD with carotid artery involvement, DM type II, TIA, CKD stage III    Clinical Impression   Pt reports she was independent with ADL PTA. Currently pt overall min assist for ADL and functional mobility with the exception of mod assist for LB ADL. VSS throughout; mild SOB with activity. Pt planning to d/c home with 24/7 supervision from family. Recommending HHOT for follow up to maximize independence and safety with ADL and functional mobility upon return home. Pt would benefit from continued skilled OT to address established goals.    Follow Up Recommendations  Home health OT;Supervision/Assistance - 24 hour    Equipment Recommendations  3 in 1 bedside commode    Recommendations for Other Services       Precautions / Restrictions Precautions Precautions: Fall Restrictions Weight Bearing Restrictions: No      Mobility Bed Mobility Overal bed mobility: Needs Assistance Bed Mobility: Rolling;Sidelying to Sit;Sit to Sidelying Rolling: Min guard Sidelying to sit: HOB elevated;Mod assist     Sit to sidelying: Supervision General bed mobility comments: Pt OOB in chair  Transfers Overall transfer level: Needs assistance Equipment used: Rolling walker (2 wheeled) Transfers: Sit to/from Stand Sit to Stand: Min assist Stand pivot transfers: Min assist       General transfer comment: Min assist to boost up from chair and for balance in standing. Cues for hand placement and  technique with RW    Balance Overall balance assessment: Needs assistance Sitting-balance support: Feet supported;No upper extremity supported Sitting balance-Leahy Scale: Good     Standing balance support: Bilateral upper extremity supported Standing balance-Leahy Scale: Poor Standing balance comment: Requires at least 1 UE support in standing. Reports some dizziness but VSS.                           ADL either performed or assessed with clinical judgement   ADL Overall ADL's : Needs assistance/impaired Eating/Feeding: Set up;Sitting   Grooming: Min guard;Sitting   Upper Body Bathing: Minimal assistance;Sitting   Lower Body Bathing: Moderate assistance;Sit to/from stand   Upper Body Dressing : Minimal assistance;Sitting   Lower Body Dressing: Moderate assistance;Sit to/from stand   Toilet Transfer: Minimal assistance;Ambulation;RW Toilet Transfer Details (indicate cue type and reason): Simulated by sit to stand from EOB with functional mobility in room         Functional mobility during ADLs: Minimal assistance;Rolling walker General ADL Comments: Educated pt on pursed lip breathing and energy conservation strategies. VSS throughout, pt with mild SOB during activity.     Vision         Perception     Praxis      Pertinent Vitals/Pain Pain Assessment: No/denies pain     Hand Dominance     Extremity/Trunk Assessment Upper Extremity Assessment Upper Extremity Assessment: Generalized weakness   Lower Extremity Assessment Lower Extremity Assessment: Defer to PT evaluation       Communication Communication Communication: No difficulties   Cognition Arousal/Alertness: Awake/alert Behavior During Therapy: WFL for tasks assessed/performed Overall Cognitive  Status: Within Functional Limits for tasks assessed Area of Impairment: Problem solving;Following commands                             Problem Solving: Slow processing;Requires  verbal cues General Comments: Difficulty describing symptoms but kept laying down without reason and grimacing??  "I feel weak" Per daughter, pt has returned to cognitive baseline.   General Comments  Daughter present during session. Pt seems to be anxious without supplemental 02 donned but did not wear PTA. Sp02 remained>90% on RA (dropped for a little but wave form not accurate) and pt SOB when laying in supine with MD present, otherwise no SOB noted.    Exercises     Shoulder Instructions      Home Living Family/patient expects to be discharged to:: Private residence Living Arrangements: Children;Other relatives Available Help at Discharge: Family;Available 24 hours/day Type of Home: House Home Access: Stairs to enter [employer] of Steps: 3 Entrance Stairs-Rails: Right Home Layout: Two level;Bed/bath upstairs Alternate Level Stairs-Number of Steps: 1 flight   Bathroom Shower/Tub: Occupational psychologist: Standard     Home Equipment: Environmental consultant - 4 wheels;Cane - single point          Prior Functioning/Environment Level of Independence: Independent        Comments: Drives, cooks, cleans. Lives with daughter who can be there 24/7. Bedroom is on second level.        OT Problem List: Decreased strength;Decreased activity tolerance;Impaired balance (sitting and/or standing);Decreased knowledge of use of DME or AE;Obesity      OT Treatment/Interventions: Self-care/ADL training;Therapeutic exercise;Energy conservation;DME and/or AE instruction;Therapeutic activities;Patient/family education;Balance training    OT Goals(Current goals can be found in the care plan section) Acute Rehab OT Goals Patient Stated Goal: to feel better and go home OT Goal Formulation: With patient/family Time For Goal Achievement: 08/05/16 Potential to Achieve Goals: Good ADL Goals Pt Will Perform Grooming: with supervision;standing (x3 tasks) Pt Will Perform Upper Body  Bathing: with supervision;sitting Pt Will Perform Lower Body Bathing: with supervision;sit to/from stand Pt Will Transfer to Toilet: with supervision;ambulating;bedside commode (over toilet) Pt Will Perform Toileting - Clothing Manipulation and hygiene: with supervision;sit to/from stand Pt Will Perform Tub/Shower Transfer: Shower transfer;with supervision;ambulating;3 in 1;rolling walker  OT Frequency: Min 2X/week   Barriers to D/C: Inaccessible home environment  bed/bath upstairs       Co-evaluation              AM-PAC PT "6 Clicks" Daily Activity     Outcome Measure Help from another person eating meals?: None Help from another person taking care of personal grooming?: A Little Help from another person toileting, which includes using toliet, bedpan, or urinal?: A Little Help from another person bathing (including washing, rinsing, drying)?: A Lot Help from another person to put on and taking off regular upper body clothing?: A Little Help from another person to put on and taking off regular lower body clothing?: A Lot 6 Click Score: 17   End of Session Equipment Utilized During Treatment: Gait belt;Rolling walker Nurse Communication: Mobility status  Activity Tolerance: Patient tolerated treatment well Patient left: in chair;with call bell/phone within reach;with family/visitor present  OT Visit Diagnosis: Unsteadiness on feet (R26.81);Muscle weakness (generalized) (M62.81)                Time: 5885-0277 OT Time Calculation (min): 20 min Charges:  OT General Charges $OT Visit: 1  Procedure OT Evaluation $OT Eval Moderate Complexity: 1 Procedure G-Codes:     Diane Hanel A. Ulice Brilliant, M.S., OTR/L Pager: [place] 07/22/2016, 1:06 PM

## 2016-07-22 NOTE — Care Management Note (Signed)
Case Management Note  Patient Details  Name: Yvonne Patel MRN: [id number] Date of Birth: November 22, 1945  Subjective/Objective:   From home, s/p CEA, then had AMS, went to [facility] now at [facility].  Patient plans to stay will daughter at discharge.    5/17 [place], BSN - per pt eval rec HHPT,  NCM offered choice for HHPT, patient states she would like to wait until tomorrow to decide, NCM also informed her that with [employer] she may have a 25.00 co pay.  She wanted me to check with her insurance to see what her co pay would be.  NCM informed her , would let her know later today.   Also OT eval rec HHOt and 3 n 1.  Patient does not want HHOT or 3 n 1.  Per patient she just wants HHPT, with [facility] , referral [facility] to [facility] with [facility] for HHPT.  Soc will begin 24-48 hrs post dc.    PCP listed as Kelton Pillar                  Action/Plan:   Expected Discharge Date:  07/22/16               Expected Discharge Plan:  [place]  In-House Referral:     Discharge planning Services  CM Consult  Post Acute Care Choice:    Choice offered to:  Patient  DME Arranged:    DME Agency:     HH Arranged:  PT [place]:  [place]  Status of Service:  Completed, signed off  If discussed at [place] of Stay Meetings, dates discussed:    Additional Comments:  [name], RN 07/22/2016, 3:55 PM

## 2016-07-22 NOTE — Progress Notes (Signed)
PROGRESS NOTE    Yvonne Patel  PIR:[id number] DOB: 09-Feb-1946 DOA: 07/17/2016 PCP: Kelton Pillar, MD   Brief Narrative:   71 y.o. female with h/o HTN, hyperlipidemia, CAD, PAD with carotid arteries involvement - s/p recent left CEA (07/14/2016), DM type II, TIA, CKD stage III who was admitted to the [facility] on 07/18/2016 with c/o headache, weakness, mental status changes with confusion on 07/18/2016  Since her CEA patient developed progressive global weakness and became less conversational than usual, had slurred speech and complained of constant headache and poor appetite.   Assessment & Plan:   Principal Problem:   Altered mental status Active Problems:   Diabetes mellitus (HCC)   Hyperlipidemia   ANEMIA, IRON DEFICIENCY   HTN (hypertension)   PERIPHERAL VASCULAR DISEASE   CKD (chronic kidney disease), stage III   Diabetes mellitus with complication (HCC)   Elevated troponin  Altered Mental Status;resolved - likely in the setting of CVA/Reperfusion injury vs underlying infection  -cont currently Abx for now as she is doing better. Will switch her over to augment to see how she does.  -cont neurochecks  -MRI of the brain is consistent = acute supra/infratentorial subcm infarcts. Increasing chronic appearing Micro hemorrhages. Per Vasc surgery its reperfusion injury.  - CT head - neg for any acute changes  - Left Carotid US - 40-59% high end ICA stenosis.  -Cont ASA, Plavix and statin.  -PT/OT rec- rehab -Speech and swallow eval - Mild aspiration risk but ok for diet.   HCAP -Cultures are neg thus far. PO Abx starting today - Augmentin  -2 view CXR 5/15 - developing opacity in right perihilar region and right base - possible infiltrate with mild cardiomegaly with edema.   Elevated troponins; trending down  -stable per cardiology. No acute new ekg changes. Its trending down. No need to trend it as we already know it was high few days ago for which she was seen by Cardiology.    Acute Mild sob overnight, improved at this time  -CXR this morning showed possible pulm edema. I will order lasix 40mg  iv x2. Monitor I/Os  Anemia (Patient is Jehovah's witness) -likely from chronic disease, no active signs of blood loss.  -Iron studies already done - Cont Hemocyte, minimize blood draws.  -ok with Epo per patient   Acute on CKD stage III -avoid nephrotoxic drugs, likely from transient injury. Will cont to monitor along with urine output.   DM2 -acccuchecks, ISS  HLD -on statin   HTN -monitor for now   DVT prophylaxis: Heparin  Code Status: Full  Family Communication:  Daughters at bedside. Answered all of their questions  Disposition Plan: Will transfer her to Med-Surg with remote tele.   Consultants:   Vascular surgery   Cardiology   Procedures:   None this admission   Antimicrobials:   Rocephine 5/13 > 5/15  Cefepime 5/15 > 5/17  Vanc 5/15 > 5/17  Augmentin 5/17   Subjective: No complaints this morning but did have sob last night requiring O2. This morning she remain mildly sob therefore cxr was done.  She remains afebrile.   Objective: Vitals:   07/22/16 0126 07/22/16 0555 07/22/16 0612 07/22/16 0810  BP:   (!) 143/55 (!) 158/47  Pulse:   67 70  Resp: (!) 28  (!) 22 (!) 23  Temp:   98.7 F (37.1 C) 98.6 F (37 C)  TempSrc:   Oral Oral  SpO2: 96%  92% 98%  Weight:  86.5 kg (  190 lb 11.2 oz)    Height:        Intake/Output Summary (Last 24 hours) at 07/22/16 1100 Last data filed at 07/22/16 0906  Gross per 24 hour  Intake              890 ml  Output             1400 ml  Net             -510 ml   Filed Weights   07/19/16 0500 07/21/16 0620 07/22/16 0555  Weight: 84.6 kg (186 lb 8.2 oz) 85.4 kg (188 lb 4.4 oz) 86.5 kg (190 lb 11.2 oz)    Examination:  General exam: Appears calm and comfortable  Respiratory system: diffuse diminished BS Cardiovascular system: S1 & S2 heard, RRR. No JVD, murmurs, rubs, gallops or  clicks. No pedal edema. Gastrointestinal system: Abdomen is nondistended, soft and nontender. No organomegaly or masses felt. Normal bowel sounds heard. Central nervous system: Alert and oriented. No focal neurological deficits. Extremities: Symmetric 5 x 5 power. Skin: No rashes, lesions or ulcers Psychiatry: Judgement and insight appear normal. Mood & affect appropriate.     Data Reviewed:   CBC:  Recent Labs Lab 07/18/16 0024 07/18/16 0031 07/18/16 1651  WBC 8.7  --  10.3  NEUTROABS 6.7  --   --   HGB 7.0* 7.5* 7.7*  HCT 20.8* 22.0* 22.9*  MCV 92.9  --  93.1  PLT 109*  --  528*   Basic Metabolic Panel:  Recent Labs Lab 07/18/16 0024 07/18/16 0031 07/18/16 1651 07/21/16 0241  NA 138 140 139 135  K 3.6 3.7 3.6 3.9  CL 105 106 107 107  CO2 23  --  21* 20*  GLUCOSE 95 91 128* 217*  BUN 27* 26* 20 31*  CREATININE 1.68* 1.60* 1.56* 1.86*  CALCIUM 8.9  --  8.4* 8.3*   GFR: Estimated Creatinine Clearance: 30.1 mL/min (A) (by C-G formula based on SCr of 1.86 mg/dL (H)). Liver Function Tests:  Recent Labs Lab 07/18/16 0024  AST 32  ALT 17  ALKPHOS 86  BILITOT 0.5  PROT 6.1*  ALBUMIN 2.9*   No results for input(s): LIPASE, AMYLASE in the last 168 hours.  Recent Labs Lab 07/18/16 0024  AMMONIA 26   Coagulation Profile: No results for input(s): INR, PROTIME in the last 168 hours. Cardiac Enzymes:  Recent Labs Lab 07/22/16 0808  TROPONINI 0.73*   BNP (last 3 results) No results for input(s): PROBNP in the last 8760 hours. HbA1C: No results for input(s): HGBA1C in the last 72 hours. CBG:  Recent Labs Lab 07/21/16 0803 07/21/16 1209 07/21/16 1704 07/21/16 2101 07/22/16 0817  GLUCAP 145* 163* 148* 129* 155*   Lipid Profile: No results for input(s): CHOL, HDL, LDLCALC, TRIG, CHOLHDL, LDLDIRECT in the last 72 hours. Thyroid Function Tests: No results for input(s): TSH, T4TOTAL, FREET4, T3FREE, THYROIDAB in the last 72 hours. Anemia  Panel:  Recent Labs  07/21/16 1056  VITAMINB12 533   Sepsis Labs:  Recent Labs Lab 07/18/16 0033 07/19/16 0231 07/20/16 1839 07/22/16 0619  PROCALCITON  --   --  0.32 0.36  LATICACIDVEN 2.83* 1.0 2.0*  --     Recent Results (from the past 240 hour(s))  Urine culture     Status: Abnormal   Collection Time: 07/18/16  2:31 AM  Result Value Ref Range Status   Specimen Description URINE, RANDOM  Final   Special Requests NONE  Final  Culture (A)  Final    <10,000 COLONIES/mL INSIGNIFICANT GROWTH Performed at [facility] [address], [place], [place] [address]    Report Status 07/19/2016 FINAL  Final  Culture, blood (routine x 2)     Status: None (Preliminary result)   Collection Time: 07/18/16  4:52 PM  Result Value Ref Range Status   Specimen Description BLOOD LEFT ANTECUBITAL  Final   Special Requests   Final    BOTTLES DRAWN AEROBIC AND ANAEROBIC Blood Culture adequate volume   Culture NO GROWTH 4 DAYS  Final   Report Status PENDING  Incomplete  Culture, blood (routine x 2)     Status: None (Preliminary result)   Collection Time: 07/18/16  5:23 PM  Result Value Ref Range Status   Specimen Description BLOOD RIGHT ANTECUBITAL  Final   Special Requests IN PEDIATRIC BOTTLE Blood Culture adequate volume  Final   Culture NO GROWTH 4 DAYS  Final   Report Status PENDING  Incomplete  Culture, blood (routine x 2)     Status: None (Preliminary result)   Collection Time: 07/20/16  6:43 PM  Result Value Ref Range Status   Specimen Description BLOOD LEFT ANTECUBITAL  Final   Special Requests IN PEDIATRIC BOTTLE Blood Culture adequate volume  Final   Culture NO GROWTH 2 DAYS  Final   Report Status PENDING  Incomplete  Culture, blood (routine x 2)     Status: None (Preliminary result)   Collection Time: 07/20/16  6:55 PM  Result Value Ref Range Status   Specimen Description BLOOD LEFT HAND  Final   Special Requests IN PEDIATRIC BOTTLE Blood Culture adequate volume   Final   Culture NO GROWTH 2 DAYS  Final   Report Status PENDING  Incomplete         Radiology Studies: Dg Chest 2 View  Result Date: 07/22/2016 CLINICAL DATA:  Shortness of breath, dyspnea, history of peripheral vascular disease and coronary artery disease with cardiomyopathy. EXAM: CHEST  2 VIEW COMPARISON:  Chest x-ray of Jul 20 2016 FINDINGS: Since the earlier study there developed confluent alveolar opacities bilaterally. The pulmonary vascularity is engorged. The cardiac silhouette remains enlarged. Small pleural effusions layer posteriorly. There is calcification in the wall of the thoracic aorta. IMPRESSION: Interval development of fluffy alveolar opacities most compatible with pulmonary edema though bilateral pneumonia is not excluded. Small bilateral pleural effusions are present and more conspicuous today. Stable cardiomegaly with pulmonary vascular congestion. Thoracic aortic atherosclerosis. Electronically Signed   By: David  Martinique M.D.   On: 07/22/2016 08:53   Dg Chest 2 View  Result Date: 07/20/2016 CLINICAL DATA:  Fever.  Postop. EXAM: CHEST  2 VIEW COMPARISON:  Jul 18, 2016 FINDINGS: No pneumothorax. Cardiomegaly. Diffuse interstitial opacities suggests mild edema. More focal opacity seen in the right perihilar region and the right base could represent developing infiltrate. Tiny effusions. No other acute abnormalities. IMPRESSION: 1. Developing opacities in the right perihilar region and right base could represent developing infiltrate. Recommend clinical correlation and attention on follow-up. 2. Cardiomegaly and mild edema. Electronically Signed   By: Dorise Bullion III M.D   On: 07/20/2016 12:33   Mr Brain Wo Contrast  Result Date: 07/20/2016 CLINICAL DATA:  Altered mental status. Recent cardiogenic shock. History of hypertension, diabetes, hyperlipidemia. EXAM: MRI HEAD WITHOUT CONTRAST TECHNIQUE: Multiplanar, multiecho pulse sequences of the brain and surrounding  structures were obtained without intravenous contrast. COMPARISON:  CT HEAD Jul 18, 2016 and MRI of the head  September 30, 2011 FINDINGS: BRAIN: Numerous subcentimeter supratentorial white matter and and single LEFT cerebellum subcentimeter foci of reduced diffusion with low ADC values. Increased number of chronic appearing micro hemorrhages associated with chronic hypertension. 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 Patchy to confluent supratentorial white matter and to lesser extent pontine T2 hyperintensities. VASCULAR: Normal major intracranial vascular flow voids present at skull base. SKULL AND UPPER CERVICAL SPINE: No abnormal sellar expansion. No suspicious calvarial bone marrow signal. Craniocervical junction maintained. SINUSES/ORBITS: The mastoid air-cells and included paranasal sinuses are well-aerated. Bilateral concha lamella. The included ocular globes and orbital contents are non-suspicious. Status post bilateral ocular lens implants. OTHER: Patient is edentulous. IMPRESSION: Numerous acute supra- and infratentorial subcentimeter infarcts suggests in embolic phenomena or, sequela of patient's recent cardiogenic shock. Increasing chronic appearing micro hemorrhages associated with chronic hypertension. 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 Electronically Signed   By: Elon Alas M.D.   On: 07/20/2016 22:19        Scheduled Meds: . aspirin EC  81 mg Oral QHS  . atorvastatin  80 mg Oral q1800  . clopidogrel  75 mg Oral QHS  . Darbepoetin Alfa  25 mcg Subcutaneous Q7 days  . ezetimibe  10 mg Oral QPM  . [START ON 07/23/2016] Ferrous Fumarate  1 tablet Oral TID WC  . furosemide  40 mg Intravenous BID  . heparin  5,000 Units Subcutaneous Q8H  . insulin aspart  0-15 Units Subcutaneous TID WC  . prednisoLONE acetate  1 drop Left Eye QID   Continuous Infusions: . ceFEPime (MAXIPIME) IV Stopped (07/21/16 1646)  . ferric gluconate (FERRLECIT/NULECIT)  IV 125 mg (07/22/16 1047)  . vancomycin Stopped (07/21/16 1911)     LOS: 4 days    Time spent: 35 mins     Tashaya Ancrum Arsenio Loader, MD [facility] Pager [phone]   If 7PM-7AM, please contact night-coverage [email or web address] Password [email or web address] 07/22/2016, 11:00 AM

## 2016-07-23 ENCOUNTER — Encounter: Payer: Self-pay | Admitting: Vascular Surgery

## 2016-07-23 DIAGNOSIS — E0800 Diabetes mellitus due to underlying condition with hyperosmolarity without nonketotic hyperglycemic-hyperosmolar coma (NKHHC): Secondary | ICD-10-CM

## 2016-07-23 LAB — BASIC METABOLIC PANEL
Anion gap: 10 (ref 5–15)
BUN: 24 mg/dL — AB (ref 6–20)
CHLORIDE: 107 mmol/L (ref 101–111)
CO2: 22 mmol/L (ref 22–32)
CREATININE: 1.67 mg/dL — AB (ref 0.44–1.00)
Calcium: 8.3 mg/dL — ABNORMAL LOW (ref 8.9–10.3)
GFR calc Af Amer: 34 mL/min — ABNORMAL LOW (ref >60)
GFR calc non Af Amer: 30 mL/min — ABNORMAL LOW (ref >60)
Glucose, Bld: 117 mg/dL — ABNORMAL HIGH (ref 65–99)
POTASSIUM: 3.3 mmol/L — AB (ref 3.5–5.1)
SODIUM: 139 mmol/L (ref 135–145)

## 2016-07-23 LAB — GLUCOSE, CAPILLARY
GLUCOSE-CAPILLARY: 129 mg/dL — AB (ref 65–99)
GLUCOSE-CAPILLARY: 130 mg/dL — AB (ref 65–99)
Glucose-Capillary: 194 mg/dL — ABNORMAL HIGH (ref 65–99)
Glucose-Capillary: 137 mg/dL — ABNORMAL HIGH (ref 65–99)

## 2016-07-23 LAB — CULTURE, BLOOD (ROUTINE X 2)
CULTURE: NO GROWTH
Culture: NO GROWTH
SPECIAL REQUESTS: ADEQUATE
Special Requests: ADEQUATE

## 2016-07-23 MED ORDER — PANTOPRAZOLE SODIUM 40 MG PO TBEC
40.0000 mg | DELAYED_RELEASE_TABLET | Freq: Every day | ORAL | Status: DC
  Administered 2016-07-23 – 2016-07-24 (×2): 40 mg via ORAL
  Filled 2016-07-23 (×2): qty 1

## 2016-07-23 MED ORDER — FUROSEMIDE 10 MG/ML IJ SOLN
60.0000 mg | Freq: Once | INTRAMUSCULAR | Status: AC
  Administered 2016-07-23: 60 mg via INTRAVENOUS
  Filled 2016-07-23: qty 6

## 2016-07-23 MED ORDER — ONDANSETRON HCL 4 MG/2ML IJ SOLN: 4.0000 mg | Freq: Four times a day (QID) | INTRAMUSCULAR | Status: DC | PRN

## 2016-07-23 MED ORDER — FUROSEMIDE 10 MG/ML IJ SOLN: 40.0000 mg | Freq: Once | INTRAMUSCULAR | Status: DC

## 2016-07-23 MED ORDER — POTASSIUM CHLORIDE CRYS ER 20 MEQ PO TBCR
60.0000 meq | EXTENDED_RELEASE_TABLET | Freq: Two times a day (BID) | ORAL | Status: AC
  Administered 2016-07-23 (×2): 60 meq via ORAL
  Filled 2016-07-23 (×2): qty 3

## 2016-07-23 NOTE — Progress Notes (Addendum)
[facility] Progress Note  Subjective    Still reporting some shortness of breath.   Objective Vitals:   07/22/16 1804 07/22/16 2220  BP: (!) 138/50 (!) 124/55  Pulse: 67 76  Resp: (!) 22 20  Temp: 98.4 F (36.9 C) 98.6 F (37 C)    Intake/Output Summary (Last 24 hours) at 07/23/16 0932 Last data filed at 07/23/16 4098  Gross per 24 hour  Intake                0 ml  Output             1500 ml  Net            -1500 ml  . Alert and oriented.  Left neck incision intact. 5/5 strength upper and lower extremities bilaterally.   Assessment/Planning: 71 y.o. female admitted with AMS possibly from reperfusion injury s/p recent left CEA  Mental status improved. EPO started for anemia. Had CXR yesterday showing pulmonary edema and was diuresed.  Troponins trending down.   Alvia Grove 07/23/2016 9:32 AM --  Laboratory CBC    Component Value Date/Time   WBC 11.4 (H) 07/22/2016 2037   HGB 7.2 (L) 07/22/2016 2037   HCT 22.0 (L) 07/22/2016 2037   HCT 22.4 (L) 07/21/2016 1056   PLT 178 07/22/2016 2037    BMET    Component Value Date/Time   NA 139 07/23/2016 0617   K 3.3 (L) 07/23/2016 0617   CL 107 07/23/2016 0617   CO2 22 07/23/2016 0617   GLUCOSE 117 (H) 07/23/2016 0617   BUN 24 (H) 07/23/2016 0617   CREATININE 1.67 (H) 07/23/2016 0617   CALCIUM 8.3 (L) 07/23/2016 0617   GFRNONAA 30 (L) 07/23/2016 0617   GFRAA 34 (L) 07/23/2016 0617    COAG Lab Results  Component Value Date   INR 0.98 07/01/2016   INR 1.03 02/17/2009   INR 1.0 11/13/2008   No results found for: PTT  Antibiotics Anti-infectives    Start     Dose/Rate Route Frequency Ordered Stop   07/22/16 1115  amoxicillin-clavulanate (AUGMENTIN) 875-125 MG per tablet 1 tablet     1 tablet Oral Every 12 hours 07/22/16 1103     07/21/16 1800  vancomycin (VANCOCIN) 1,250 mg in sodium chloride 0.9 % 250 mL IVPB  Status:  Discontinued     1,250 mg 166.7 mL/hr over 90 Minutes  Intravenous Every 24 hours 07/20/16 1341 07/22/16 1103   07/20/16 1345  ceFEPIme (MAXIPIME) 2 g in dextrose 5 % 50 mL IVPB  Status:  Discontinued     2 g 100 mL/hr over 30 Minutes Intravenous Every 24 hours 07/20/16 1339 07/22/16 1103   07/20/16 1345  vancomycin (VANCOCIN) 1,500 mg in sodium chloride 0.9 % 500 mL IVPB     1,500 mg 250 mL/hr over 120 Minutes Intravenous  Once 07/20/16 1340 07/20/16 1823   07/18/16 1730  cefTRIAXone (ROCEPHIN) 1 g in dextrose 5 % 50 mL IVPB  Status:  Discontinued     1 g 100 mL/hr over 30 Minutes Intravenous Every 24 hours 07/18/16 1638 07/20/16 [place], PA-C [facility] Office: [phone] Pager: [phone] 07/23/2016 9:32 AM   Addendum  I have independently interviewed and examined the patient, and I agree with the physician assistant's findings.    Adele Barthel, MD, FACS [facility] [place] Office: [phone] Pager: [phone]  07/23/2016, 4:33 PM

## 2016-07-23 NOTE — Progress Notes (Addendum)
PROGRESS NOTE    Yvonne Patel  DXI:[id number] DOB: 1945-07-05 DOA: 07/17/2016 PCP: Kelton Pillar, MD   Brief Narrative:   71 y.o. female with h/o HTN, hyperlipidemia, CAD, PAD with carotid arteries involvement - s/p recent left CEA (07/14/2016), DM type II, TIA, CKD stage III who was admitted to the [facility] on 07/18/2016 with c/o headache, weakness, mental status changes with confusion on 07/18/2016  Since her CEA patient developed progressive global weakness and became less conversational than usual, had slurred speech and complained of constant headache and poor appetite.   Assessment & Plan:   Principal Problem:   Altered mental status Active Problems:   Diabetes mellitus (HCC)   Hyperlipidemia   ANEMIA, IRON DEFICIENCY   HTN (hypertension)   PERIPHERAL VASCULAR DISEASE   CKD (chronic kidney disease), stage III   Diabetes mellitus with complication (HCC)   Elevated troponin   Altered Mental Status;resolved - likely in the setting of CVA/Reperfusion injury vs underlying infection  -cont currently Abx for now as she is doing better. Will switch her over to augment to see how she does.  -cont neurochecks  -MRI of the brain is consistent = acute supra/infratentorial subcm infarcts. Increasing chronic appearing Micro hemorrhages. Per Vasc surgery its reperfusion injury s/p recent left CEA .  - CT head - neg for any acute changes  - Left Carotid US - 40-59% high end ICA stenosis.  -Cont ASA, Plavix and statin.  -PT/OT rec- rehab -Speech and swallow eval - Mild aspiration risk but ok for diet.   HCAP -Cultures are neg thus far. Currently on PO Abx  - Augmentin  -2 view CXR 5/15 - developing opacity in right perihilar region and right base - possible infiltrate with mild cardiomegaly with edema.   Elevated troponins; trending down  -stable per cardiology. No acute new ekg changes. Its trending down. No need to trend it as we already know it was high few days ago for which she was seen  by Cardiology.   Acute Mild sob could be secondary to acute on chronic systolic heart failure chest x-ray 5/18-interval development of alveolar opacities compatible with pulmonary edema, cannot rule out bilateral pneumonia, small pleural effusions Needs further diuresis with IV Lasix Most recent echo shows EF of 45-50% on 07/20/16  Anemia (Patient is Jehovah's witness) -likely from chronic disease, no active signs of blood loss. Baseline hemoglobin 7.0-9.2 -Iron studies already done - received  Hemocyte, minimize blood draws.  -ok with Epo per patient  Patient on Plavix, start concomitant PPI    Acute on CKD stage III-baseline creatinine around 1.4-1.6 -avoid nephrotoxic drugs, likely from transient injury. Will cont to monitor along with urine output. Renal function in the setting of ongoing diuretic use  DM2 -acccuchecks, ISS, hold metformin, hemoglobin A1c 6.3 on 07/19/16  HLD -on statin   HTN -monitor for now   Hypokalemia-replete   DVT prophylaxis: Heparin  Code Status: Full  Family Communication:  Daughter  at bedside. Answered all of their questions  Disposition Plan:  Continue telemetry   Consultants:   Vascular surgery   Cardiology   Procedures:   None this admission   Antimicrobials:   Rocephine 5/13 > 5/15  Cefepime 5/15 > 5/17  Vanc 5/15 > 5/17  Augmentin 5/17   Subjective:  Complaining of shortness of breath, no chest pain She remains afebrile.   Objective: Vitals:   07/22/16 1704 07/22/16 1804 07/22/16 2220 07/23/16 0500  BP: (!) 114/59 (!) 138/50 (!) 124/55  Pulse: 70 67 76   Resp: (!) 23 (!) 22 20   Temp: 98.7 F (37.1 C) 98.4 F (36.9 C) 98.6 F (37 C)   TempSrc: Oral Oral Oral   SpO2: 97% 99% 94%   Weight:    86 kg (189 lb 8 oz)  Height:        Intake/Output Summary (Last 24 hours) at 07/23/16 1100 Last data filed at 07/23/16 0611  Gross per 24 hour  Intake                0 ml  Output             1500 ml  Net             -1500 ml   Filed Weights   07/21/16 0620 07/22/16 0555 07/23/16 0500  Weight: 85.4 kg (188 lb 4.4 oz) 86.5 kg (190 lb 11.2 oz) 86 kg (189 lb 8 oz)    Examination:  General exam: Appears calm and comfortable  Respiratory system: diffuse diminished BS Cardiovascular system: S1 & S2 heard, RRR. No JVD, murmurs, rubs, gallops or clicks. No pedal edema. Gastrointestinal system: Abdomen is nondistended, soft and nontender. No organomegaly or masses felt. Normal bowel sounds heard. Central nervous system: Alert and oriented. No focal neurological deficits. Extremities: Symmetric 5 x 5 power. Skin: No rashes, lesions or ulcers Psychiatry: Judgement and insight appear normal. Mood & affect appropriate.     Data Reviewed:   CBC:  Recent Labs Lab 07/18/16 0024 07/18/16 0031 07/18/16 1651 07/21/16 1056 07/22/16 2037  WBC 8.7  --  10.3  --  11.4*  NEUTROABS 6.7  --   --   --   --   HGB 7.0* 7.5* 7.7*  --  7.2*  HCT 20.8* 22.0* 22.9* 22.4* 22.0*  MCV 92.9  --  93.1  --  91.7  PLT 109*  --  114*  --  263   Basic Metabolic Panel:  Recent Labs Lab 07/18/16 0024 07/18/16 0031 07/18/16 1651 07/21/16 0241 07/23/16 0617  NA 138 140 139 135 139  K 3.6 3.7 3.6 3.9 3.3*  CL 105 106 107 107 107  CO2 23  --  21* 20* 22  GLUCOSE 95 91 128* 217* 117*  BUN 27* 26* 20 31* 24*  CREATININE 1.68* 1.60* 1.56* 1.86* 1.67*  CALCIUM 8.9  --  8.4* 8.3* 8.3*   GFR: Estimated Creatinine Clearance: 33.5 mL/min (A) (by C-G formula based on SCr of 1.67 mg/dL (H)). Liver Function Tests:  Recent Labs Lab 07/18/16 0024  AST 32  ALT 17  ALKPHOS 86  BILITOT 0.5  PROT 6.1*  ALBUMIN 2.9*   No results for input(s): LIPASE, AMYLASE in the last 168 hours.  Recent Labs Lab 07/18/16 0024  AMMONIA 26   Coagulation Profile: No results for input(s): INR, PROTIME in the last 168 hours. Cardiac Enzymes:  Recent Labs Lab 07/22/16 0808 07/22/16 1446 07/22/16 2029  TROPONINI 0.73* 0.67* 0.49*     BNP (last 3 results) No results for input(s): PROBNP in the last 8760 hours. HbA1C: No results for input(s): HGBA1C in the last 72 hours. CBG:  Recent Labs Lab 07/22/16 1151 07/22/16 1709 07/22/16 1840 07/22/16 2006 07/23/16 0752  GLUCAP 143* 166* 149* 138* 129*   Lipid Profile: No results for input(s): CHOL, HDL, LDLCALC, TRIG, CHOLHDL, LDLDIRECT in the last 72 hours. Thyroid Function Tests: No results for input(s): TSH, T4TOTAL, FREET4, T3FREE, THYROIDAB in the last 72 hours. Anemia Panel:  Recent Labs  07/21/16 1056  VITAMINB12 533   Sepsis Labs:  Recent Labs Lab 07/18/16 0033 07/19/16 0231 07/20/16 1839 07/22/16 0619  PROCALCITON  --   --  0.32 0.36  LATICACIDVEN 2.83* 1.0 2.0*  --     Recent Results (from the past 240 hour(s))  Urine culture     Status: Abnormal   Collection Time: 07/18/16  2:31 AM  Result Value Ref Range Status   Specimen Description URINE, RANDOM  Final   Special Requests NONE  Final   Culture (A)  Final    <10,000 COLONIES/mL INSIGNIFICANT GROWTH Performed at [facility], [place] [address], [place], [place] [address]    Report Status 07/19/2016 FINAL  Final  Culture, blood (routine x 2)     Status: None (Preliminary result)   Collection Time: 07/18/16  4:52 PM  Result Value Ref Range Status   Specimen Description BLOOD LEFT ANTECUBITAL  Final   Special Requests   Final    BOTTLES DRAWN AEROBIC AND ANAEROBIC Blood Culture adequate volume   Culture NO GROWTH 4 DAYS  Final   Report Status PENDING  Incomplete  Culture, blood (routine x 2)     Status: None (Preliminary result)   Collection Time: 07/18/16  5:23 PM  Result Value Ref Range Status   Specimen Description BLOOD RIGHT ANTECUBITAL  Final   Special Requests IN PEDIATRIC BOTTLE Blood Culture adequate volume  Final   Culture NO GROWTH 4 DAYS  Final   Report Status PENDING  Incomplete  Culture, blood (routine x 2)     Status: None (Preliminary result)   Collection Time:  07/20/16  6:43 PM  Result Value Ref Range Status   Specimen Description BLOOD LEFT ANTECUBITAL  Final   Special Requests IN PEDIATRIC BOTTLE Blood Culture adequate volume  Final   Culture NO GROWTH 2 DAYS  Final   Report Status PENDING  Incomplete  Culture, blood (routine x 2)     Status: None (Preliminary result)   Collection Time: 07/20/16  6:55 PM  Result Value Ref Range Status   Specimen Description BLOOD LEFT HAND  Final   Special Requests IN PEDIATRIC BOTTLE Blood Culture adequate volume  Final   Culture NO GROWTH 2 DAYS  Final   Report Status PENDING  Incomplete         Radiology Studies: Dg Chest 2 View  Result Date: 07/22/2016 CLINICAL DATA:  Shortness of breath, dyspnea, history of peripheral vascular disease and coronary artery disease with cardiomyopathy. EXAM: CHEST  2 VIEW COMPARISON:  Chest x-ray of Jul 20 2016 FINDINGS: Since the earlier study there developed confluent alveolar opacities bilaterally. The pulmonary vascularity is engorged. The cardiac silhouette remains enlarged. Small pleural effusions layer posteriorly. There is calcification in the wall of the thoracic aorta. IMPRESSION: Interval development of fluffy alveolar opacities most compatible with pulmonary edema though bilateral pneumonia is not excluded. Small bilateral pleural effusions are present and more conspicuous today. Stable cardiomegaly with pulmonary vascular congestion. Thoracic aortic atherosclerosis. Electronically Signed   By: David  Martinique M.D.   On: 07/22/2016 08:53        Scheduled Meds: . amoxicillin-clavulanate  1 tablet Oral Q12H  . aspirin EC  81 mg Oral QHS  . atorvastatin  80 mg Oral q1800  . clopidogrel  75 mg Oral QHS  . Darbepoetin Alfa  25 mcg Subcutaneous Q7 days  . ezetimibe  10 mg Oral QPM  . Ferrous Fumarate  1 tablet Oral TID  WC  . heparin  5,000 Units Subcutaneous Q8H  . insulin aspart  0-15 Units Subcutaneous TID WC  . prednisoLONE acetate  1 drop Left Eye QID    Continuous Infusions:    LOS: 5 days    Time spent: 35 mins     Reyne Dumas, MD [facility] Pager [phone]  If 7PM-7AM, please contact night-coverage [email or web address] Password [email or web address] 07/23/2016, 11:00 AM

## 2016-07-23 NOTE — Progress Notes (Signed)
Physical Therapy Treatment Patient Details Name: Yvonne Patel MRN: [id number] DOB: 1946-01-31 Today's Date: 07/23/2016    History of Present Illness Patient is a 71 y/o female who was recently d/ced on 5/9 s/p endarterectomy and readmitted due to AMS, headache, weakness. 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 PMH-HTN, HLD, CAD, PAD with carotid artery involvement, DM type II, TIA, CKD stage III     PT Comments    Pt performed short gait trials to and from the commode during session today.  Pt with flexed posture in standing.  DOE 2/4 SPO2 on RA 95%.  Pt agreeable to supine and seated exercises.  Plan to return home with support from daughter remains appropriate.    Follow Up Recommendations  Home health PT;Supervision for mobility/OOB;Supervision/Assistance - 24 hour     Equipment Recommendations  None recommended by PT    Recommendations for Other Services       Precautions / Restrictions Precautions Precautions: Fall Restrictions Weight Bearing Restrictions: No    Mobility  Bed Mobility Overal bed mobility: Needs Assistance Bed Mobility: Supine to Sit;Sit to Supine     Supine to sit: Supervision;HOB elevated Sit to supine: Supervision;HOB elevated   General bed mobility comments: + use of rail and max VCs for technique to improve ease of transfer.  Pt required cues for boosting up to head of bed.    Transfers Overall transfer level: Needs assistance Equipment used: Rolling walker (2 wheeled) Transfers: Sit to/from Stand Sit to Stand: Min guard Stand pivot transfers: Min guard       General transfer comment: Cues for forward weight shifting and pushing through B LEs to achieve standing.  Pt required cues for upper trunk control.  Pt has difficulty with using R hand due to pain.  Pt with  quick turn to toilet as she began to urinate into the floor.    Ambulation/Gait Ambulation/Gait assistance: Min guard Ambulation Distance (Feet): 15 Feet (x2 to and from bathroom.  Noticeable DOE 2/4.  ) Assistive device: None Gait Pattern/deviations: Step-to pattern;Shuffle;Trunk flexed;Decreased stride length   Gait velocity interpretation: Below normal speed for age/gender General Gait Details: Cues for upper trunk control, increasing, stride length and for turns and backing.     Stairs            Wheelchair Mobility    Modified Rankin (Stroke Patients Only)       Balance Overall balance assessment: Needs assistance   Sitting balance-Leahy Scale: Good       Standing balance-Leahy Scale: Poor                              Cognition Arousal/Alertness: Awake/alert Behavior During Therapy: WFL for tasks assessed/performed Overall Cognitive Status: Within Functional Limits for tasks assessed Area of Impairment: Problem solving;Following commands                       Following Commands: Follows one step commands inconsistently     Problem Solving: Slow processing;Requires verbal cues        Exercises General Exercises - Lower Extremity Ankle Circles/Pumps: AROM;Both;10 reps;Supine Long Arc Quad: AROM;Both;10 reps;Seated Hip ABduction/ADduction: AROM;Both;10 reps;Supine Straight Leg Raises: AROM;Both;10 reps;Supine Hip Flexion/Marching: AROM;Both;10 reps;Seated    General Comments        Pertinent Vitals/Pain Pain  Assessment: 0-10 Pain Score: 5  Pain Location: R dorsal aspect of hand Pain Descriptors / Indicators: Tender;Tightness Pain Intervention(s): Monitored during session;Heat applied    Home Living                      Prior Function            PT Goals (current goals can now be found in the care plan section) Acute Rehab PT Goals Patient Stated Goal: to feel better and go home Potential to Achieve Goals:  Good Progress towards PT goals: Progressing toward goals    Frequency    Min 3X/week      PT Plan Current plan remains appropriate    Co-evaluation              AM-PAC PT "6 Clicks" Daily Activity  Outcome Measure  Difficulty turning over in bed (including adjusting bedclothes, sheets and blankets)?: A Lot Difficulty moving from lying on back to sitting on the side of the bed? : A Lot Difficulty sitting down on and standing up from a chair with arms (e.g., wheelchair, bedside commode, etc,.)?: A Little Help needed moving to and from a bed to chair (including a wheelchair)?: A Little Help needed walking in hospital room?: A Little Help needed climbing 3-5 steps with a railing? : A Little 6 Click Score: 16    End of Session Equipment Utilized During Treatment: Gait belt Activity Tolerance: Patient tolerated treatment well Patient left: in chair;with call bell/phone within reach;with chair alarm set;with family/visitor present Nurse Communication: Mobility status PT Visit Diagnosis: Muscle weakness (generalized) (M62.81);Unsteadiness on feet (R26.81)     Time: 7829-5621 PT Time Calculation (min) (ACUTE ONLY): 25 min  Charges:  $Gait Training: 8-22 mins $Therapeutic Exercise: 8-22 mins                    G Codes:       Governor Rooks, PTA pager [place] 07/23/2016, 1:49 PM

## 2016-07-24 DIAGNOSIS — D508 Other iron deficiency anemias: Secondary | ICD-10-CM

## 2016-07-24 DIAGNOSIS — E785 Hyperlipidemia, unspecified: Secondary | ICD-10-CM

## 2016-07-24 DIAGNOSIS — R748 Abnormal levels of other serum enzymes: Secondary | ICD-10-CM

## 2016-07-24 DIAGNOSIS — R400 Somnolence: Secondary | ICD-10-CM

## 2016-07-24 DIAGNOSIS — E118 Type 2 diabetes mellitus with unspecified complications: Secondary | ICD-10-CM

## 2016-07-24 DIAGNOSIS — I10 Essential (primary) hypertension: Secondary | ICD-10-CM

## 2016-07-24 LAB — COMPREHENSIVE METABOLIC PANEL
ALK PHOS: 126 U/L (ref 38–126)
ALT: 65 U/L — ABNORMAL HIGH (ref 14–54)
ANION GAP: 9 (ref 5–15)
AST: 31 U/L (ref 15–41)
Albumin: 2.2 g/dL — ABNORMAL LOW (ref 3.5–5.0)
BUN: 20 mg/dL (ref 6–20)
CALCIUM: 8.4 mg/dL — AB (ref 8.9–10.3)
CO2: 25 mmol/L (ref 22–32)
Chloride: 105 mmol/L (ref 101–111)
Creatinine, Ser: 1.56 mg/dL — ABNORMAL HIGH (ref 0.44–1.00)
GFR calc non Af Amer: 32 mL/min — ABNORMAL LOW (ref >60)
GFR, EST AFRICAN AMERICAN: 37 mL/min — AB (ref >60)
Glucose, Bld: 122 mg/dL — ABNORMAL HIGH (ref 65–99)
Potassium: 3.7 mmol/L (ref 3.5–5.1)
SODIUM: 139 mmol/L (ref 135–145)
TOTAL PROTEIN: 5.9 g/dL — AB (ref 6.5–8.1)
Total Bilirubin: 0.6 mg/dL (ref 0.3–1.2)

## 2016-07-24 LAB — GLUCOSE, CAPILLARY
GLUCOSE-CAPILLARY: 137 mg/dL — AB (ref 65–99)
GLUCOSE-CAPILLARY: 180 mg/dL — AB (ref 65–99)

## 2016-07-24 LAB — CBC
HCT: 22.8 % — ABNORMAL LOW (ref 36.0–46.0)
HEMOGLOBIN: 7.4 g/dL — AB (ref 12.0–15.0)
MCH: 30.8 pg (ref 26.0–34.0)
MCHC: 32.5 g/dL (ref 30.0–36.0)
MCV: 95 fL (ref 78.0–100.0)
Platelets: 239 10*3/uL (ref 150–400)
RBC: 2.4 MIL/uL — AB (ref 3.87–5.11)
RDW: 14.4 % (ref 11.5–15.5)
WBC: 10.4 10*3/uL (ref 4.0–10.5)

## 2016-07-24 LAB — PROCALCITONIN: PROCALCITONIN: 0.17 ng/mL

## 2016-07-24 MED ORDER — FERROUS FUMARATE 324 (106 FE) MG PO TABS: 1.0000 | ORAL_TABLET | Freq: Two times a day (BID) | ORAL | 1 refills | Status: AC

## 2016-07-24 MED ORDER — AMOXICILLIN-POT CLAVULANATE 875-125 MG PO TABS: 1.0000 | ORAL_TABLET | Freq: Two times a day (BID) | ORAL | 0 refills | Status: DC

## 2016-07-24 NOTE — Progress Notes (Signed)
Notified by RN that patient will DC today needing HH PT. Previous Case Management note states HH has already been arranged. No further CM needs.

## 2016-07-24 NOTE — Discharge Summary (Addendum)
Physician Discharge Summary  Yvonne Patel PQZ:[id number] DOB: 13-Oct-1945 DOA: 07/17/2016  PCP: Kelton Pillar, MD  Admit date: 07/17/2016 Discharge date: 07/24/2016  Time spent: 25* minutes  Recommendations for Outpatient Follow-up:  1. Follow-up PCP in 2 weeks 2. Follow up vascular surgery in 2 weeks 3. We need to check serum iron, ferritin, TIBC in 3 months 4. Patient will be discharged on ferrous fumarate 1 tablet by mouth twice a day 5. Patient will be discharged on home health physical therapy 6. Continue antibiotic Augmentin 1 tablet by mouth twice a day for 4 more days   Discharge Diagnoses:  Principal Problem:   Altered mental status Active Problems:   Diabetes mellitus (HCC)   Hyperlipidemia   ANEMIA, IRON DEFICIENCY   HTN (hypertension)   PERIPHERAL VASCULAR DISEASE   CKD (chronic kidney disease), stage III   Diabetes mellitus with complication (HCC)   Elevated troponin   Discharge Condition: Stable  Diet recommendation: Heart healthy diet  Filed Weights   07/21/16 0620 07/22/16 0555 07/23/16 0500  Weight: 85.4 kg (188 lb 4.4 oz) 86.5 kg (190 lb 11.2 oz) 86 kg (189 lb 8 oz)    History of present illness:  71 y.o.femalewith h/o HTN, hyperlipidemia, CAD, PAD with carotid arteries involvement - s/p recent leftCEA (07/14/2016), DM type II, TIA, CKD stage III who was admitted to the [facility] on 07/18/2016 with c/o headache, weakness, mental status changes with confusion on 07/18/2016  Since her CEA patient developed progressive global weakness and became less conversational than usual, had slurred speech and complained of constant headache and poor appetite.    Hospital Course:  Altered mental status- resolved, likely from reperfusion injury after carotid endarterectomy. Patient was empirically started on antibiotics for healthcare associated pneumonia.-MRI of the brain is consistent = acute supra/infratentorial subcm infarcts. Increasing chronic appearing Micro  hemorrhages. Left carotid ultrasound showed 40-50% ICA stenosis. Continue aspirin, Plavix, statin. Patient will be discharged home on home health physical therapy. Space therapy showed only mild aspiration risk but okay for regular diet.  ? Healthcare associated pneumonia- patient's chest x-ray showed opacity in right perihilar region and right base possible infiltrate with mild cardia medically and pulmonary edema. Patient was started empirically on antibiotics and has completed 6 days of therapy in the hospital. Will send home on 4 more days of Augmentin to complete 10 days of therapy. Blood cultures have remained negative thus far.  Elevated troponin- which is trending down, cardiology was following patient in the hospital. And recommend no intervention at this time.  Acute on chronic diastolic CHF-resolved, chest x-ray showed pulmonary edema, and received IV Lasix. At this time patient is not having shortness of breath, and not requiring oxygen. She is currently at baseline. She will continue to take her HCTZ 25 mg by mouth daily at home.  Anemia-chronic, patient is a Sales promotion account executive Witness, no blood transfusion. Hemoglobin is stable at 7.4. Iron level was found to be significantly low at 18, with saturation 9%. Patient has been started on ferrous fumarate will discharge on 1 tablet by mouth twice a day. We will need to check iron level and ferritin in 3 months.  Diabetes mellitus-  hemoglobin A1c was 6.3 on 07/19/2016. Continue metformin  Hypertension-blood pressure stable, continue home medications   Addendum- patient's daughter called, and had concerns about pulmonary edema and anemia. Patient's hemoglobin today is 7.4 which was 7.0 on 07/18/2016 at the time of admission. Hemoglobin has gone up from 7.2 yesterday to 7.4 today. Patient is  Jehovah's Witness, so no blood transfusion. She has been started on ferrous fumarate which we continued as outpatient. I have asked the family to have PCP check  CBC in 2 weeks and also check serum iron, ferritin and TIBC in 2-3 months. Regarding pulmonary edema, I called Dr. Einar Gip  and confirmed with him that patient can be discharged on Hyzaar 100/25, which has 25 mg HCTZ. This should be enough for patients pulmonary edema. At this time she is not requiring oxygen her O2 sats around 97% on room air. Patient received IV Lasix yesterday and has diuresed well. Dr. Einar Gip  will see patient in 3 days, on 07/27/2016. This was conveyed to patient's daughter, and she is agreeable with above plan.     Procedures:  None   Consultations:  Cardiology  Vascular surgery  Discharge Exam: Vitals:   07/23/16 2217 07/24/16 0621  BP: 134/80 137/64  Pulse: 73 72  Resp: 18 18  Temp: 99.5 F (37.5 C) 98.8 F (37.1 C)    General: Appears in no acute distress Cardiovascular: RRR, S1-S2 Respiratory: Clear to auscultation bilaterally  Discharge Instructions   Discharge Instructions    Diet - low sodium heart healthy    Complete by:  As directed    Discharge instructions    Complete by:  As directed    Need to check Iron level in 3 months at PCP office   Increase activity slowly    Complete by:  As directed      Current Discharge Medication List    START taking these medications   Details  amoxicillin-clavulanate (AUGMENTIN) 875-125 MG tablet Take 1 tablet by mouth every 12 (twelve) hours. Qty: 8 tablet, Refills: 0    Ferrous Fumarate (HEMOCYTE - 106 MG FE) 324 (106 Fe) MG TABS tablet Take 1 tablet (106 mg of iron total) by mouth 2 (two) times daily. Qty: 60 tablet, Refills: 1      CONTINUE these medications which have NOT CHANGED   Details  amLODipine (NORVASC) 10 MG tablet Take 10 mg by mouth daily at 12 noon.     aspirin EC 81 MG tablet Take 81 mg by mouth at bedtime.     atorvastatin (LIPITOR) 80 MG tablet Take 80 mg by mouth daily at 6 PM.  Refills: 3    clopidogrel (PLAVIX) 75 MG tablet Take 75 mg by mouth at bedtime.      ezetimibe (ZETIA) 10 MG tablet Take 10 mg by mouth every evening. Refills: 2    insulin degludec (TRESIBA FLEXTOUCH) 100 UNIT/ML SOPN FlexTouch Pen Inject 30 Units into the skin daily.    labetalol (NORMODYNE) 200 MG tablet Take 200 mg by mouth 2 (two) times daily.     losartan-hydrochlorothiazide (HYZAAR) 100-25 MG tablet Take 1 tablet by mouth every evening.     metFORMIN (GLUCOPHAGE) 500 MG tablet Take 2 tablets (1,000 mg total) by mouth 2 (two) times daily.    oxyCODONE-acetaminophen (PERCOCET/ROXICET) 5-325 MG tablet Take 1 tablet by mouth every 6 (six) hours as needed for moderate pain. Qty: 6 tablet, Refills: 0    prednisoLONE acetate (PRED FORTE) 1 % ophthalmic suspension Place 1 drop into the left eye 4 (four) times daily.    Vitamin D, Ergocalciferol, (DRISDOL) 50000 units CAPS capsule Take 50,000 Units by mouth 2 (two) times a week. Sunday and Thursday Refills: 0       Allergies  Allergen Reactions  . No Known Allergies       The results of  significant diagnostics from this hospitalization (including imaging, microbiology, ancillary and laboratory) are listed below for reference.    Significant Diagnostic Studies: Dg Chest 2 View  Result Date: 07/22/2016 CLINICAL DATA:  Shortness of breath, dyspnea, history of peripheral vascular disease and coronary artery disease with cardiomyopathy. EXAM: CHEST  2 VIEW COMPARISON:  Chest x-ray of Jul 20 2016 FINDINGS: Since the earlier study there developed confluent alveolar opacities bilaterally. The pulmonary vascularity is engorged. The cardiac silhouette remains enlarged. Small pleural effusions layer posteriorly. There is calcification in the wall of the thoracic aorta. IMPRESSION: Interval development of fluffy alveolar opacities most compatible with pulmonary edema though bilateral pneumonia is not excluded. Small bilateral pleural effusions are present and more conspicuous today. Stable cardiomegaly with pulmonary vascular  congestion. Thoracic aortic atherosclerosis. Electronically Signed   By: David  Martinique M.D.   On: 07/22/2016 08:53   Dg Chest 2 View  Result Date: 07/20/2016 CLINICAL DATA:  Fever.  Postop. EXAM: CHEST  2 VIEW COMPARISON:  Jul 18, 2016 FINDINGS: No pneumothorax. Cardiomegaly. Diffuse interstitial opacities suggests mild edema. More focal opacity seen in the right perihilar region and the right base could represent developing infiltrate. Tiny effusions. No other acute abnormalities. IMPRESSION: 1. Developing opacities in the right perihilar region and right base could represent developing infiltrate. Recommend clinical correlation and attention on follow-up. 2. Cardiomegaly and mild edema. Electronically Signed   By: Dorise Bullion III M.D   On: 07/20/2016 12:33   Dg Chest 2 View  Result Date: 07/18/2016 CLINICAL DATA:  71 y/o  F; weakness. EXAM: CHEST  2 VIEW COMPARISON:  01/03/2010 chest radiograph FINDINGS: Mild cardiomegaly increased from prior radiographs. Aortic calcification. Pulmonary venous hypertension. No consolidation, effusion, or pneumothorax. Mild multilevel degenerative changes of thoracic spine. IMPRESSION: Mild cardiomegaly increased from prior radiographs. Pulmonary venous hypertension. No focal consolidation. Calcific aortic atherosclerosis. Electronically Signed   By: Kristine Garbe M.D.   On: 07/18/2016 01:12   Ct Head Wo Contrast  Result Date: 07/18/2016 CLINICAL DATA:  Carotid endarterectomy on 07/14/2016. Increasing confusion today. Altered mental status. Increased weakness. EXAM: CT HEAD WITHOUT CONTRAST TECHNIQUE: Contiguous axial images were obtained from the base of the skull through the vertex without intravenous contrast. COMPARISON:  MRI brain 09/30/2011.  CT head 09/28/2011. FINDINGS: Brain: Focal low-attenuation area is demonstrated in the deep white matter bilaterally in the region of the basal ganglia. This is unchanged since previous study and likely  represents areas of small vessel ischemia. No ventricular dilatation. No mass-effect or midline shift. No abnormal extra-axial fluid collections. Gray-white matter junctions are distinct. Basal cisterns are not effaced. No acute intracranial hemorrhage. Vascular: No hyperdense vessel or unexpected calcification. Skull: Calvarium appears intact. Sinuses/Orbits: No acute finding. Other: No significant changes since previous study. IMPRESSION: No acute intracranial abnormalities. Chronic white matter changes likely representing small vessel ischemia. No change since prior study. Electronically Signed   By: Lucienne Capers M.D.   On: 07/18/2016 00:19   Mr Brain Wo Contrast  Result Date: 07/20/2016 CLINICAL DATA:  Altered mental status. Recent cardiogenic shock. History of hypertension, diabetes, hyperlipidemia. EXAM: MRI HEAD WITHOUT CONTRAST TECHNIQUE: Multiplanar, multiecho pulse sequences of the brain and surrounding structures were obtained without intravenous contrast. COMPARISON:  CT HEAD Jul 18, 2016 and MRI of the head September 30, 2011 FINDINGS: BRAIN: Numerous subcentimeter supratentorial white matter and and single LEFT cerebellum subcentimeter foci of reduced diffusion with low ADC values. Increased number of chronic appearing micro hemorrhages associated with chronic hypertension. Old  bilateral basal ganglia infarcts. Patchy to confluent supratentorial white matter and to lesser extent pontine T2 hyperintensities. VASCULAR: Normal major intracranial vascular flow voids present at skull base. SKULL AND UPPER CERVICAL SPINE: No abnormal sellar expansion. No suspicious calvarial bone marrow signal. Craniocervical junction maintained. SINUSES/ORBITS: The mastoid air-cells and included paranasal sinuses are well-aerated. Bilateral concha lamella. The included ocular globes and orbital contents are non-suspicious. Status post bilateral ocular lens implants. OTHER: Patient is edentulous. IMPRESSION: Numerous  acute supra- and infratentorial subcentimeter infarcts suggests in embolic phenomena or, sequela of patient's recent cardiogenic shock. Increasing chronic appearing micro hemorrhages associated with chronic hypertension. 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 Electronically Signed   By: Elon Alas M.D.   On: 07/20/2016 22:19    Microbiology: Recent Results (from the past 240 hour(s))  Urine culture     Status: Abnormal   Collection Time: 07/18/16  2:31 AM  Result Value Ref Range Status   Specimen Description URINE, RANDOM  Final   Special Requests NONE  Final   Culture (A)  Final    <10,000 COLONIES/mL INSIGNIFICANT GROWTH Performed at [facility], [address], [place], [place] [address]    Report Status 07/19/2016 FINAL  Final  Culture, blood (routine x 2)     Status: None   Collection Time: 07/18/16  4:52 PM  Result Value Ref Range Status   Specimen Description BLOOD LEFT ANTECUBITAL  Final   Special Requests   Final    BOTTLES DRAWN AEROBIC AND ANAEROBIC Blood Culture adequate volume   Culture NO GROWTH 5 DAYS  Final   Report Status 07/23/2016 FINAL  Final  Culture, blood (routine x 2)     Status: None   Collection Time: 07/18/16  5:23 PM  Result Value Ref Range Status   Specimen Description BLOOD RIGHT ANTECUBITAL  Final   Special Requests IN PEDIATRIC BOTTLE Blood Culture adequate volume  Final   Culture NO GROWTH 5 DAYS  Final   Report Status 07/23/2016 FINAL  Final  Culture, blood (routine x 2)     Status: None (Preliminary result)   Collection Time: 07/20/16  6:43 PM  Result Value Ref Range Status   Specimen Description BLOOD LEFT ANTECUBITAL  Final   Special Requests IN PEDIATRIC BOTTLE Blood Culture adequate volume  Final   Culture NO GROWTH 3 DAYS  Final   Report Status PENDING  Incomplete  Culture, blood (routine x 2)     Status: None (Preliminary result)   Collection Time: 07/20/16  6:55 PM  Result Value Ref  Range Status   Specimen Description BLOOD LEFT HAND  Final   Special Requests IN PEDIATRIC BOTTLE Blood Culture adequate volume  Final   Culture NO GROWTH 3 DAYS  Final   Report Status PENDING  Incomplete     Labs: Basic Metabolic Panel:  Recent Labs Lab 07/18/16 0024 07/18/16 0031 07/18/16 1651 07/21/16 0241 07/23/16 0617 07/24/16 0519  NA 138 140 139 135 139 139  K 3.6 3.7 3.6 3.9 3.3* 3.7  CL 105 106 107 107 107 105  CO2 23  --  21* 20* 22 25  GLUCOSE 95 91 128* 217* 117* 122*  BUN 27* 26* 20 31* 24* 20  CREATININE 1.68* 1.60* 1.56* 1.86* 1.67* 1.56*  CALCIUM 8.9  --  8.4* 8.3* 8.3* 8.4*   Liver Function Tests:  Recent Labs Lab 07/18/16 0024 07/24/16 0519  AST 32 31  ALT 17 65*  ALKPHOS  86 126  BILITOT 0.5 0.6  PROT 6.1* 5.9*  ALBUMIN 2.9* 2.2*   No results for input(s): LIPASE, AMYLASE in the last 168 hours.  Recent Labs Lab 07/18/16 0024  AMMONIA 26   CBC:  Recent Labs Lab 07/18/16 0024 07/18/16 0031 07/18/16 1651 07/21/16 1056 07/22/16 2037 07/24/16 0519  WBC 8.7  --  10.3  --  11.4* 10.4  NEUTROABS 6.7  --   --   --   --   --   HGB 7.0* 7.5* 7.7*  --  7.2* 7.4*  HCT 20.8* 22.0* 22.9* 22.4* 22.0* 22.8*  MCV 92.9  --  93.1  --  91.7 95.0  PLT 109*  --  114*  --  178 239   Cardiac Enzymes:  Recent Labs Lab 07/22/16 0808 07/22/16 1446 07/22/16 2029  TROPONINI 0.73* 0.67* 0.49*     CBG:  Recent Labs Lab 07/23/16 0752 07/23/16 1223 07/23/16 1651 07/23/16 2226 07/24/16 0759  GLUCAP 129* 130* 194* 137* 137*       Signed:  Eleonore Chiquito S MD.  [facility] 07/24/2016, 10:49 AM

## 2016-07-25 LAB — CULTURE, BLOOD (ROUTINE X 2)
CULTURE: NO GROWTH
CULTURE: NO GROWTH
Special Requests: ADEQUATE
Special Requests: ADEQUATE

## 2016-07-26 DIAGNOSIS — I130 Hypertensive heart and chronic kidney disease with heart failure and stage 1 through stage 4 chronic kidney disease, or unspecified chronic kidney disease: Secondary | ICD-10-CM | POA: Diagnosis not present

## 2016-07-26 DIAGNOSIS — J189 Pneumonia, unspecified organism: Secondary | ICD-10-CM | POA: Diagnosis not present

## 2016-07-26 DIAGNOSIS — E785 Hyperlipidemia, unspecified: Secondary | ICD-10-CM | POA: Diagnosis not present

## 2016-07-26 DIAGNOSIS — Z79891 Long term (current) use of opiate analgesic: Secondary | ICD-10-CM | POA: Diagnosis not present

## 2016-07-26 DIAGNOSIS — I5033 Acute on chronic diastolic (congestive) heart failure: Secondary | ICD-10-CM | POA: Diagnosis not present

## 2016-07-26 DIAGNOSIS — E1151 Type 2 diabetes mellitus with diabetic peripheral angiopathy without gangrene: Secondary | ICD-10-CM | POA: Diagnosis not present

## 2016-07-26 DIAGNOSIS — D509 Iron deficiency anemia, unspecified: Secondary | ICD-10-CM | POA: Diagnosis not present

## 2016-07-26 DIAGNOSIS — Z794 Long term (current) use of insulin: Secondary | ICD-10-CM | POA: Diagnosis not present

## 2016-07-26 DIAGNOSIS — I2510 Atherosclerotic heart disease of native coronary artery without angina pectoris: Secondary | ICD-10-CM | POA: Diagnosis not present

## 2016-07-26 DIAGNOSIS — E1140 Type 2 diabetes mellitus with diabetic neuropathy, unspecified: Secondary | ICD-10-CM | POA: Diagnosis not present

## 2016-07-26 DIAGNOSIS — N183 Chronic kidney disease, stage 3 (moderate): Secondary | ICD-10-CM | POA: Diagnosis not present

## 2016-07-26 DIAGNOSIS — Z7902 Long term (current) use of antithrombotics/antiplatelets: Secondary | ICD-10-CM | POA: Diagnosis not present

## 2016-07-26 DIAGNOSIS — Z7982 Long term (current) use of aspirin: Secondary | ICD-10-CM | POA: Diagnosis not present

## 2016-07-26 DIAGNOSIS — E1122 Type 2 diabetes mellitus with diabetic chronic kidney disease: Secondary | ICD-10-CM | POA: Diagnosis not present

## 2016-07-27 DIAGNOSIS — R0609 Other forms of dyspnea: Secondary | ICD-10-CM | POA: Diagnosis not present

## 2016-07-27 DIAGNOSIS — I10 Essential (primary) hypertension: Secondary | ICD-10-CM | POA: Diagnosis not present

## 2016-07-27 DIAGNOSIS — I25119 Atherosclerotic heart disease of native coronary artery with unspecified angina pectoris: Secondary | ICD-10-CM | POA: Diagnosis not present

## 2016-07-27 DIAGNOSIS — E1151 Type 2 diabetes mellitus with diabetic peripheral angiopathy without gangrene: Secondary | ICD-10-CM | POA: Diagnosis not present

## 2016-08-05 ENCOUNTER — Ambulatory Visit (INDEPENDENT_AMBULATORY_CARE_PROVIDER_SITE_OTHER): Payer: Self-pay | Admitting: Vascular Surgery

## 2016-08-05 ENCOUNTER — Encounter: Payer: Self-pay | Admitting: Vascular Surgery

## 2016-08-05 VITALS — BP 123/67 | HR 79 | Temp 97.9°F | Ht 65.0 in | Wt 167.0 lb

## 2016-08-05 DIAGNOSIS — I130 Hypertensive heart and chronic kidney disease with heart failure and stage 1 through stage 4 chronic kidney disease, or unspecified chronic kidney disease: Secondary | ICD-10-CM | POA: Diagnosis not present

## 2016-08-05 DIAGNOSIS — J189 Pneumonia, unspecified organism: Secondary | ICD-10-CM | POA: Diagnosis not present

## 2016-08-05 DIAGNOSIS — E1122 Type 2 diabetes mellitus with diabetic chronic kidney disease: Secondary | ICD-10-CM | POA: Diagnosis not present

## 2016-08-05 DIAGNOSIS — I5033 Acute on chronic diastolic (congestive) heart failure: Secondary | ICD-10-CM | POA: Diagnosis not present

## 2016-08-05 DIAGNOSIS — I6522 Occlusion and stenosis of left carotid artery: Secondary | ICD-10-CM

## 2016-08-05 DIAGNOSIS — E1140 Type 2 diabetes mellitus with diabetic neuropathy, unspecified: Secondary | ICD-10-CM | POA: Diagnosis not present

## 2016-08-05 DIAGNOSIS — E785 Hyperlipidemia, unspecified: Secondary | ICD-10-CM | POA: Diagnosis not present

## 2016-08-05 DIAGNOSIS — Z79891 Long term (current) use of opiate analgesic: Secondary | ICD-10-CM | POA: Diagnosis not present

## 2016-08-05 DIAGNOSIS — E1151 Type 2 diabetes mellitus with diabetic peripheral angiopathy without gangrene: Secondary | ICD-10-CM | POA: Diagnosis not present

## 2016-08-05 DIAGNOSIS — N183 Chronic kidney disease, stage 3 (moderate): Secondary | ICD-10-CM | POA: Diagnosis not present

## 2016-08-05 DIAGNOSIS — I739 Peripheral vascular disease, unspecified: Secondary | ICD-10-CM

## 2016-08-05 DIAGNOSIS — I2510 Atherosclerotic heart disease of native coronary artery without angina pectoris: Secondary | ICD-10-CM | POA: Diagnosis not present

## 2016-08-05 DIAGNOSIS — Z7902 Long term (current) use of antithrombotics/antiplatelets: Secondary | ICD-10-CM | POA: Diagnosis not present

## 2016-08-05 DIAGNOSIS — Z794 Long term (current) use of insulin: Secondary | ICD-10-CM | POA: Diagnosis not present

## 2016-08-05 DIAGNOSIS — Z7982 Long term (current) use of aspirin: Secondary | ICD-10-CM | POA: Diagnosis not present

## 2016-08-05 DIAGNOSIS — D509 Iron deficiency anemia, unspecified: Secondary | ICD-10-CM | POA: Diagnosis not present

## 2016-08-05 NOTE — Progress Notes (Signed)
Patient is a 71 year old female who returns for follow-up today after left carotid endarterectomy. This was complicated post procedure by a reperfusion syndrome with readmission to the hospital. She states that her confusion has improved significantly. She still feels weak overall. She is anemic. She was not transfused secondary to being a Jehovah's Witness. I discussed with her today several dietary additions that are high and iron. She is on iron tablets. She also has a known right subclavian artery stenosis but currently does not really experience any symptoms in her right arm. She measures her blood pressure in her left arm. She states her diabetes is well-controlled. She also has a history of renal artery stenting. She states her blood pressure has recently been systolic low 381O.  She has known lower extremity PAD which is followed chronically by Dr. Nadyne Coombes. She is on Plavix aspirin on a statin.  Current Outpatient Prescriptions on File Prior to Visit  Medication Sig Dispense Refill  . amLODipine (NORVASC) 10 MG tablet Take 10 mg by mouth daily at 12 noon.     Marland Kitchen amoxicillin-clavulanate (AUGMENTIN) 875-125 MG tablet Take 1 tablet by mouth every 12 (twelve) hours. 8 tablet 0  . aspirin EC 81 MG tablet Take 81 mg by mouth at bedtime.     Marland Kitchen atorvastatin (LIPITOR) 80 MG tablet Take 80 mg by mouth daily at 6 PM.   3  . clopidogrel (PLAVIX) 75 MG tablet Take 75 mg by mouth at bedtime.     Marland Kitchen ezetimibe (ZETIA) 10 MG tablet Take 10 mg by mouth every evening.  2  . Ferrous Fumarate (HEMOCYTE - 106 MG FE) 324 (106 Fe) MG TABS tablet Take 1 tablet (106 mg of iron total) by mouth 2 (two) times daily. 60 tablet 1  . insulin degludec (TRESIBA FLEXTOUCH) 100 UNIT/ML SOPN FlexTouch Pen Inject 30 Units into the skin daily.    Marland Kitchen labetalol (NORMODYNE) 200 MG tablet Take 200 mg by mouth 2 (two) times daily.     Marland Kitchen losartan-hydrochlorothiazide (HYZAAR) 100-25 MG tablet Take 1 tablet by mouth every evening.     .  metFORMIN (GLUCOPHAGE) 500 MG tablet Take 2 tablets (1,000 mg total) by mouth 2 (two) times daily.    . prednisoLONE acetate (PRED FORTE) 1 % ophthalmic suspension Place 1 drop into the left eye 4 (four) times daily.    . Vitamin D, Ergocalciferol, (DRISDOL) 50000 units CAPS capsule Take 50,000 Units by mouth 2 (two) times a week. Sunday and Thursday  0   No current facility-administered medications on file prior to visit.      Physical exam:  Vitals:   08/05/16 1202  BP: 123/67  Pulse: 79  Temp: 97.9 F (36.6 C)  TempSrc: Oral  SpO2: 98%  Weight: 167 lb (75.8 kg)  Height: 5\' 5"  (1.651 m)    Neck: Well-healed left neck incision no drainage no erythema no hematoma  Neuro: Symmetric upper and lower motor strength 5 over 5 no facial asymmetry  Assessment: Doing well status post left carotid endarterectomy overall recovered except for weakness best likely secondary to her anemia which should improve over the next few weeks.  Plan: The patient will return for follow-up carotid duplex scan in 6 months time and see our nurse practitioner that visit. If she develops symptoms of exertional fatigue in the right arm dizziness or nonhealing wounds on the right hand we would consider a subclavian intervention. She had a very difficult time with a carotid endarterectomy so I would reserve  this for severe symptoms rather than electively repairing this.  [name], MD [facility] Office: [phone] Pager: [phone]

## 2016-08-10 DIAGNOSIS — J189 Pneumonia, unspecified organism: Secondary | ICD-10-CM | POA: Diagnosis not present

## 2016-08-10 DIAGNOSIS — Z79891 Long term (current) use of opiate analgesic: Secondary | ICD-10-CM | POA: Diagnosis not present

## 2016-08-10 DIAGNOSIS — Z7902 Long term (current) use of antithrombotics/antiplatelets: Secondary | ICD-10-CM | POA: Diagnosis not present

## 2016-08-10 DIAGNOSIS — E1151 Type 2 diabetes mellitus with diabetic peripheral angiopathy without gangrene: Secondary | ICD-10-CM | POA: Diagnosis not present

## 2016-08-10 DIAGNOSIS — I5033 Acute on chronic diastolic (congestive) heart failure: Secondary | ICD-10-CM | POA: Diagnosis not present

## 2016-08-10 DIAGNOSIS — I130 Hypertensive heart and chronic kidney disease with heart failure and stage 1 through stage 4 chronic kidney disease, or unspecified chronic kidney disease: Secondary | ICD-10-CM | POA: Diagnosis not present

## 2016-08-10 DIAGNOSIS — Z7982 Long term (current) use of aspirin: Secondary | ICD-10-CM | POA: Diagnosis not present

## 2016-08-10 DIAGNOSIS — Z794 Long term (current) use of insulin: Secondary | ICD-10-CM | POA: Diagnosis not present

## 2016-08-10 DIAGNOSIS — E785 Hyperlipidemia, unspecified: Secondary | ICD-10-CM | POA: Diagnosis not present

## 2016-08-10 DIAGNOSIS — N183 Chronic kidney disease, stage 3 (moderate): Secondary | ICD-10-CM | POA: Diagnosis not present

## 2016-08-10 DIAGNOSIS — I2510 Atherosclerotic heart disease of native coronary artery without angina pectoris: Secondary | ICD-10-CM | POA: Diagnosis not present

## 2016-08-10 DIAGNOSIS — D509 Iron deficiency anemia, unspecified: Secondary | ICD-10-CM | POA: Diagnosis not present

## 2016-08-10 DIAGNOSIS — E1140 Type 2 diabetes mellitus with diabetic neuropathy, unspecified: Secondary | ICD-10-CM | POA: Diagnosis not present

## 2016-08-10 DIAGNOSIS — E1122 Type 2 diabetes mellitus with diabetic chronic kidney disease: Secondary | ICD-10-CM | POA: Diagnosis not present

## 2016-08-13 NOTE — Addendum Note (Signed)
Addended by: Lianne Cure A on: 08/13/2016 02:04 PM   Modules accepted: Orders

## 2016-08-18 DIAGNOSIS — Z7982 Long term (current) use of aspirin: Secondary | ICD-10-CM | POA: Diagnosis not present

## 2016-08-18 DIAGNOSIS — E1140 Type 2 diabetes mellitus with diabetic neuropathy, unspecified: Secondary | ICD-10-CM | POA: Diagnosis not present

## 2016-08-18 DIAGNOSIS — Z794 Long term (current) use of insulin: Secondary | ICD-10-CM | POA: Diagnosis not present

## 2016-08-18 DIAGNOSIS — E1122 Type 2 diabetes mellitus with diabetic chronic kidney disease: Secondary | ICD-10-CM | POA: Diagnosis not present

## 2016-08-18 DIAGNOSIS — Z7902 Long term (current) use of antithrombotics/antiplatelets: Secondary | ICD-10-CM | POA: Diagnosis not present

## 2016-08-18 DIAGNOSIS — E785 Hyperlipidemia, unspecified: Secondary | ICD-10-CM | POA: Diagnosis not present

## 2016-08-18 DIAGNOSIS — D509 Iron deficiency anemia, unspecified: Secondary | ICD-10-CM | POA: Diagnosis not present

## 2016-08-18 DIAGNOSIS — E1151 Type 2 diabetes mellitus with diabetic peripheral angiopathy without gangrene: Secondary | ICD-10-CM | POA: Diagnosis not present

## 2016-08-18 DIAGNOSIS — J189 Pneumonia, unspecified organism: Secondary | ICD-10-CM | POA: Diagnosis not present

## 2016-08-18 DIAGNOSIS — I5033 Acute on chronic diastolic (congestive) heart failure: Secondary | ICD-10-CM | POA: Diagnosis not present

## 2016-08-18 DIAGNOSIS — I2510 Atherosclerotic heart disease of native coronary artery without angina pectoris: Secondary | ICD-10-CM | POA: Diagnosis not present

## 2016-08-18 DIAGNOSIS — I130 Hypertensive heart and chronic kidney disease with heart failure and stage 1 through stage 4 chronic kidney disease, or unspecified chronic kidney disease: Secondary | ICD-10-CM | POA: Diagnosis not present

## 2016-08-18 DIAGNOSIS — Z79891 Long term (current) use of opiate analgesic: Secondary | ICD-10-CM | POA: Diagnosis not present

## 2016-08-18 DIAGNOSIS — N183 Chronic kidney disease, stage 3 (moderate): Secondary | ICD-10-CM | POA: Diagnosis not present

## 2016-08-19 DIAGNOSIS — E1151 Type 2 diabetes mellitus with diabetic peripheral angiopathy without gangrene: Secondary | ICD-10-CM | POA: Diagnosis not present

## 2016-08-19 DIAGNOSIS — E785 Hyperlipidemia, unspecified: Secondary | ICD-10-CM | POA: Diagnosis not present

## 2016-08-19 DIAGNOSIS — N183 Chronic kidney disease, stage 3 (moderate): Secondary | ICD-10-CM | POA: Diagnosis not present

## 2016-08-19 DIAGNOSIS — D509 Iron deficiency anemia, unspecified: Secondary | ICD-10-CM | POA: Diagnosis not present

## 2016-08-19 DIAGNOSIS — I2510 Atherosclerotic heart disease of native coronary artery without angina pectoris: Secondary | ICD-10-CM | POA: Diagnosis not present

## 2016-08-19 DIAGNOSIS — Z7902 Long term (current) use of antithrombotics/antiplatelets: Secondary | ICD-10-CM | POA: Diagnosis not present

## 2016-08-19 DIAGNOSIS — Z79891 Long term (current) use of opiate analgesic: Secondary | ICD-10-CM | POA: Diagnosis not present

## 2016-08-19 DIAGNOSIS — E1122 Type 2 diabetes mellitus with diabetic chronic kidney disease: Secondary | ICD-10-CM | POA: Diagnosis not present

## 2016-08-19 DIAGNOSIS — I130 Hypertensive heart and chronic kidney disease with heart failure and stage 1 through stage 4 chronic kidney disease, or unspecified chronic kidney disease: Secondary | ICD-10-CM | POA: Diagnosis not present

## 2016-08-19 DIAGNOSIS — Z7982 Long term (current) use of aspirin: Secondary | ICD-10-CM | POA: Diagnosis not present

## 2016-08-19 DIAGNOSIS — J189 Pneumonia, unspecified organism: Secondary | ICD-10-CM | POA: Diagnosis not present

## 2016-08-19 DIAGNOSIS — Z794 Long term (current) use of insulin: Secondary | ICD-10-CM | POA: Diagnosis not present

## 2016-08-19 DIAGNOSIS — E1140 Type 2 diabetes mellitus with diabetic neuropathy, unspecified: Secondary | ICD-10-CM | POA: Diagnosis not present

## 2016-08-19 DIAGNOSIS — I5033 Acute on chronic diastolic (congestive) heart failure: Secondary | ICD-10-CM | POA: Diagnosis not present

## 2016-08-20 ENCOUNTER — Ambulatory Visit
Admission: RE | Admit: 2016-08-20 | Discharge: 2016-08-20 | Disposition: A | Discharge status: Home / Self Care | Payer: PPO | Source: Ambulatory Visit | Attending: Cardiology | Admitting: Cardiology

## 2016-08-20 DIAGNOSIS — Z1231 Encounter for screening mammogram for malignant neoplasm of breast: Secondary | ICD-10-CM | POA: Diagnosis not present

## 2016-08-26 ENCOUNTER — Other Ambulatory Visit: Payer: Self-pay | Admitting: Internal Medicine

## 2016-08-27 ENCOUNTER — Other Ambulatory Visit: Payer: Self-pay | Admitting: Internal Medicine

## 2016-09-16 DIAGNOSIS — E113313 Type 2 diabetes mellitus with moderate nonproliferative diabetic retinopathy with macular edema, bilateral: Secondary | ICD-10-CM | POA: Diagnosis not present

## 2016-09-20 DIAGNOSIS — E119 Type 2 diabetes mellitus without complications: Secondary | ICD-10-CM | POA: Diagnosis not present

## 2016-09-20 DIAGNOSIS — E559 Vitamin D deficiency, unspecified: Secondary | ICD-10-CM | POA: Diagnosis not present

## 2016-09-20 DIAGNOSIS — E7800 Pure hypercholesterolemia, unspecified: Secondary | ICD-10-CM | POA: Diagnosis not present

## 2016-09-22 DIAGNOSIS — E113312 Type 2 diabetes mellitus with moderate nonproliferative diabetic retinopathy with macular edema, left eye: Secondary | ICD-10-CM | POA: Diagnosis not present

## 2016-09-22 DIAGNOSIS — E113311 Type 2 diabetes mellitus with moderate nonproliferative diabetic retinopathy with macular edema, right eye: Secondary | ICD-10-CM | POA: Diagnosis not present

## 2016-09-30 DIAGNOSIS — E113312 Type 2 diabetes mellitus with moderate nonproliferative diabetic retinopathy with macular edema, left eye: Secondary | ICD-10-CM | POA: Diagnosis not present

## 2016-09-30 DIAGNOSIS — E113311 Type 2 diabetes mellitus with moderate nonproliferative diabetic retinopathy with macular edema, right eye: Secondary | ICD-10-CM | POA: Diagnosis not present

## 2016-10-11 DIAGNOSIS — I4891 Unspecified atrial fibrillation: Secondary | ICD-10-CM | POA: Diagnosis not present

## 2016-10-11 DIAGNOSIS — Z5181 Encounter for therapeutic drug level monitoring: Secondary | ICD-10-CM | POA: Diagnosis not present

## 2016-10-21 ENCOUNTER — Other Ambulatory Visit: Payer: Self-pay | Admitting: Internal Medicine

## 2016-10-21 DIAGNOSIS — Z1231 Encounter for screening mammogram for malignant neoplasm of breast: Secondary | ICD-10-CM

## 2016-11-09 DIAGNOSIS — E113311 Type 2 diabetes mellitus with moderate nonproliferative diabetic retinopathy with macular edema, right eye: Secondary | ICD-10-CM | POA: Diagnosis not present

## 2016-11-09 DIAGNOSIS — E113312 Type 2 diabetes mellitus with moderate nonproliferative diabetic retinopathy with macular edema, left eye: Secondary | ICD-10-CM | POA: Diagnosis not present

## 2016-11-24 DIAGNOSIS — E113312 Type 2 diabetes mellitus with moderate nonproliferative diabetic retinopathy with macular edema, left eye: Secondary | ICD-10-CM | POA: Diagnosis not present

## 2016-11-24 DIAGNOSIS — E113311 Type 2 diabetes mellitus with moderate nonproliferative diabetic retinopathy with macular edema, right eye: Secondary | ICD-10-CM | POA: Diagnosis not present

## 2016-12-15 DIAGNOSIS — E113311 Type 2 diabetes mellitus with moderate nonproliferative diabetic retinopathy with macular edema, right eye: Secondary | ICD-10-CM | POA: Diagnosis not present

## 2016-12-15 DIAGNOSIS — E113312 Type 2 diabetes mellitus with moderate nonproliferative diabetic retinopathy with macular edema, left eye: Secondary | ICD-10-CM | POA: Diagnosis not present

## 2016-12-20 DIAGNOSIS — E782 Mixed hyperlipidemia: Secondary | ICD-10-CM | POA: Diagnosis not present

## 2016-12-20 DIAGNOSIS — I25119 Atherosclerotic heart disease of native coronary artery with unspecified angina pectoris: Secondary | ICD-10-CM | POA: Diagnosis not present

## 2016-12-20 DIAGNOSIS — I255 Ischemic cardiomyopathy: Secondary | ICD-10-CM | POA: Diagnosis not present

## 2016-12-20 DIAGNOSIS — I10 Essential (primary) hypertension: Secondary | ICD-10-CM | POA: Diagnosis not present

## 2017-02-03 DIAGNOSIS — E113311 Type 2 diabetes mellitus with moderate nonproliferative diabetic retinopathy with macular edema, right eye: Secondary | ICD-10-CM | POA: Diagnosis not present

## 2017-02-03 DIAGNOSIS — E113312 Type 2 diabetes mellitus with moderate nonproliferative diabetic retinopathy with macular edema, left eye: Secondary | ICD-10-CM | POA: Diagnosis not present

## 2017-02-08 DIAGNOSIS — I10 Essential (primary) hypertension: Secondary | ICD-10-CM | POA: Diagnosis not present

## 2017-02-08 DIAGNOSIS — E119 Type 2 diabetes mellitus without complications: Secondary | ICD-10-CM | POA: Diagnosis not present

## 2017-02-08 DIAGNOSIS — E559 Vitamin D deficiency, unspecified: Secondary | ICD-10-CM | POA: Diagnosis not present

## 2017-02-08 DIAGNOSIS — M25569 Pain in unspecified knee: Secondary | ICD-10-CM | POA: Diagnosis not present

## 2017-02-10 ENCOUNTER — Encounter (HOSPITAL_COMMUNITY): Payer: PPO

## 2017-02-10 ENCOUNTER — Ambulatory Visit: Payer: PPO | Admitting: Family

## 2017-02-10 DIAGNOSIS — E113312 Type 2 diabetes mellitus with moderate nonproliferative diabetic retinopathy with macular edema, left eye: Secondary | ICD-10-CM | POA: Diagnosis not present

## 2017-02-10 DIAGNOSIS — E113311 Type 2 diabetes mellitus with moderate nonproliferative diabetic retinopathy with macular edema, right eye: Secondary | ICD-10-CM | POA: Diagnosis not present

## 2017-02-11 DIAGNOSIS — E113312 Type 2 diabetes mellitus with moderate nonproliferative diabetic retinopathy with macular edema, left eye: Secondary | ICD-10-CM | POA: Diagnosis not present

## 2017-02-11 DIAGNOSIS — Z961 Presence of intraocular lens: Secondary | ICD-10-CM | POA: Diagnosis not present

## 2017-03-14 DIAGNOSIS — I10 Essential (primary) hypertension: Secondary | ICD-10-CM | POA: Diagnosis not present

## 2017-03-14 DIAGNOSIS — Z1231 Encounter for screening mammogram for malignant neoplasm of breast: Secondary | ICD-10-CM | POA: Diagnosis not present

## 2017-03-14 DIAGNOSIS — E1165 Type 2 diabetes mellitus with hyperglycemia: Secondary | ICD-10-CM | POA: Diagnosis not present

## 2017-03-14 DIAGNOSIS — E785 Hyperlipidemia, unspecified: Secondary | ICD-10-CM | POA: Diagnosis not present

## 2017-03-14 DIAGNOSIS — Z794 Long term (current) use of insulin: Secondary | ICD-10-CM | POA: Diagnosis not present

## 2017-03-14 DIAGNOSIS — Z6830 Body mass index (BMI) 30.0-30.9, adult: Secondary | ICD-10-CM | POA: Diagnosis not present

## 2017-03-14 DIAGNOSIS — E2839 Other primary ovarian failure: Secondary | ICD-10-CM | POA: Diagnosis not present

## 2017-03-14 DIAGNOSIS — E1169 Type 2 diabetes mellitus with other specified complication: Secondary | ICD-10-CM | POA: Diagnosis not present

## 2017-03-14 DIAGNOSIS — E1159 Type 2 diabetes mellitus with other circulatory complications: Secondary | ICD-10-CM | POA: Diagnosis not present

## 2017-03-14 DIAGNOSIS — E6609 Other obesity due to excess calories: Secondary | ICD-10-CM | POA: Diagnosis not present

## 2017-03-15 DIAGNOSIS — Z9889 Other specified postprocedural states: Secondary | ICD-10-CM | POA: Diagnosis not present

## 2017-03-15 DIAGNOSIS — I6529 Occlusion and stenosis of unspecified carotid artery: Secondary | ICD-10-CM | POA: Diagnosis not present

## 2017-03-17 DIAGNOSIS — E113311 Type 2 diabetes mellitus with moderate nonproliferative diabetic retinopathy with macular edema, right eye: Secondary | ICD-10-CM | POA: Diagnosis not present

## 2017-03-17 DIAGNOSIS — E113312 Type 2 diabetes mellitus with moderate nonproliferative diabetic retinopathy with macular edema, left eye: Secondary | ICD-10-CM | POA: Diagnosis not present

## 2017-03-23 DIAGNOSIS — E113312 Type 2 diabetes mellitus with moderate nonproliferative diabetic retinopathy with macular edema, left eye: Secondary | ICD-10-CM | POA: Diagnosis not present

## 2017-03-23 DIAGNOSIS — E113311 Type 2 diabetes mellitus with moderate nonproliferative diabetic retinopathy with macular edema, right eye: Secondary | ICD-10-CM | POA: Diagnosis not present

## 2017-04-20 DIAGNOSIS — E113312 Type 2 diabetes mellitus with moderate nonproliferative diabetic retinopathy with macular edema, left eye: Secondary | ICD-10-CM | POA: Diagnosis not present

## 2017-04-20 DIAGNOSIS — E113311 Type 2 diabetes mellitus with moderate nonproliferative diabetic retinopathy with macular edema, right eye: Secondary | ICD-10-CM | POA: Diagnosis not present

## 2017-05-10 DIAGNOSIS — E113312 Type 2 diabetes mellitus with moderate nonproliferative diabetic retinopathy with macular edema, left eye: Secondary | ICD-10-CM | POA: Diagnosis not present

## 2017-05-10 DIAGNOSIS — E113311 Type 2 diabetes mellitus with moderate nonproliferative diabetic retinopathy with macular edema, right eye: Secondary | ICD-10-CM | POA: Diagnosis not present

## 2017-05-26 DIAGNOSIS — R062 Wheezing: Secondary | ICD-10-CM | POA: Diagnosis not present

## 2017-05-30 ENCOUNTER — Ambulatory Visit: Payer: PPO | Admitting: Family Medicine

## 2017-05-30 ENCOUNTER — Encounter (HOSPITAL_COMMUNITY): Payer: Self-pay | Admitting: Emergency Medicine

## 2017-05-30 ENCOUNTER — Other Ambulatory Visit: Payer: Self-pay

## 2017-05-30 DIAGNOSIS — E0800 Diabetes mellitus due to underlying condition with hyperosmolarity without nonketotic hyperglycemic-hyperosmolar coma (NKHHC): Secondary | ICD-10-CM | POA: Diagnosis not present

## 2017-05-30 DIAGNOSIS — I2510 Atherosclerotic heart disease of native coronary artery without angina pectoris: Secondary | ICD-10-CM | POA: Diagnosis present

## 2017-05-30 DIAGNOSIS — I5023 Acute on chronic systolic (congestive) heart failure: Secondary | ICD-10-CM | POA: Diagnosis not present

## 2017-05-30 DIAGNOSIS — Z7902 Long term (current) use of antithrombotics/antiplatelets: Secondary | ICD-10-CM | POA: Diagnosis not present

## 2017-05-30 DIAGNOSIS — D508 Other iron deficiency anemias: Secondary | ICD-10-CM | POA: Diagnosis not present

## 2017-05-30 DIAGNOSIS — N183 Chronic kidney disease, stage 3 (moderate): Secondary | ICD-10-CM | POA: Diagnosis not present

## 2017-05-30 DIAGNOSIS — I5022 Chronic systolic (congestive) heart failure: Secondary | ICD-10-CM | POA: Diagnosis not present

## 2017-05-30 DIAGNOSIS — I130 Hypertensive heart and chronic kidney disease with heart failure and stage 1 through stage 4 chronic kidney disease, or unspecified chronic kidney disease: Secondary | ICD-10-CM | POA: Diagnosis not present

## 2017-05-30 DIAGNOSIS — E1122 Type 2 diabetes mellitus with diabetic chronic kidney disease: Secondary | ICD-10-CM | POA: Diagnosis not present

## 2017-05-30 DIAGNOSIS — E876 Hypokalemia: Secondary | ICD-10-CM | POA: Diagnosis present

## 2017-05-30 DIAGNOSIS — Z531 Procedure and treatment not carried out because of patient's decision for reasons of belief and group pressure: Secondary | ICD-10-CM | POA: Diagnosis not present

## 2017-05-30 DIAGNOSIS — I255 Ischemic cardiomyopathy: Secondary | ICD-10-CM | POA: Diagnosis present

## 2017-05-30 DIAGNOSIS — Z794 Long term (current) use of insulin: Secondary | ICD-10-CM | POA: Diagnosis not present

## 2017-05-30 DIAGNOSIS — Z79899 Other long term (current) drug therapy: Secondary | ICD-10-CM | POA: Diagnosis not present

## 2017-05-30 DIAGNOSIS — I5033 Acute on chronic diastolic (congestive) heart failure: Secondary | ICD-10-CM | POA: Diagnosis present

## 2017-05-30 DIAGNOSIS — I420 Dilated cardiomyopathy: Secondary | ICD-10-CM | POA: Diagnosis not present

## 2017-05-30 DIAGNOSIS — E785 Hyperlipidemia, unspecified: Secondary | ICD-10-CM | POA: Diagnosis present

## 2017-05-30 DIAGNOSIS — Z7982 Long term (current) use of aspirin: Secondary | ICD-10-CM | POA: Diagnosis not present

## 2017-05-30 DIAGNOSIS — Z8673 Personal history of transient ischemic attack (TIA), and cerebral infarction without residual deficits: Secondary | ICD-10-CM

## 2017-05-30 DIAGNOSIS — R748 Abnormal levels of other serum enzymes: Secondary | ICD-10-CM | POA: Diagnosis not present

## 2017-05-30 DIAGNOSIS — E1151 Type 2 diabetes mellitus with diabetic peripheral angiopathy without gangrene: Secondary | ICD-10-CM | POA: Diagnosis present

## 2017-05-30 DIAGNOSIS — D638 Anemia in other chronic diseases classified elsewhere: Secondary | ICD-10-CM | POA: Diagnosis not present

## 2017-05-30 DIAGNOSIS — I429 Cardiomyopathy, unspecified: Secondary | ICD-10-CM | POA: Diagnosis not present

## 2017-05-30 DIAGNOSIS — Z87891 Personal history of nicotine dependence: Secondary | ICD-10-CM

## 2017-05-30 DIAGNOSIS — R7989 Other specified abnormal findings of blood chemistry: Secondary | ICD-10-CM | POA: Diagnosis not present

## 2017-05-30 DIAGNOSIS — D509 Iron deficiency anemia, unspecified: Secondary | ICD-10-CM | POA: Diagnosis present

## 2017-05-30 DIAGNOSIS — I110 Hypertensive heart disease with heart failure: Secondary | ICD-10-CM | POA: Diagnosis not present

## 2017-05-30 DIAGNOSIS — J9600 Acute respiratory failure, unspecified whether with hypoxia or hypercapnia: Secondary | ICD-10-CM | POA: Diagnosis present

## 2017-05-30 DIAGNOSIS — I10 Essential (primary) hypertension: Secondary | ICD-10-CM | POA: Diagnosis not present

## 2017-05-30 DIAGNOSIS — I509 Heart failure, unspecified: Secondary | ICD-10-CM | POA: Diagnosis not present

## 2017-05-30 DIAGNOSIS — R0602 Shortness of breath: Secondary | ICD-10-CM | POA: Diagnosis not present

## 2017-05-30 LAB — CBC
HCT: 33.1 % — ABNORMAL LOW (ref 36.0–46.0)
HEMOGLOBIN: 11 g/dL — AB (ref 12.0–15.0)
MCH: 31.3 pg (ref 26.0–34.0)
MCHC: 33.2 g/dL (ref 30.0–36.0)
MCV: 94.3 fL (ref 78.0–100.0)
Platelets: 130 10*3/uL — ABNORMAL LOW (ref 150–400)
RBC: 3.51 MIL/uL — AB (ref 3.87–5.11)
RDW: 13.6 % (ref 11.5–15.5)
WBC: 6.1 10*3/uL (ref 4.0–10.5)

## 2017-05-30 NOTE — ED Notes (Signed)
Pt moved to [facility]

## 2017-05-30 NOTE — Patient Outreach (Signed)
[facility]) Care Management  05/30/2017  Yvonne Patel 05/20/1945 [id number]  Nurse call line Nurse call line date; 05/30/17 REFERRAL REASON; wheezing, shortness of breath Nurse call line recommendation: see physician within 4 hours.  Sent to urgent care Attempt #1  Telephone call to patient regarding nurse call line referral. Unable to reach patient. HIPAA compliant voice message left with call back phone number.   PLAN: RNCM will attempt 2nd telephone call within 4 business days. RNCM will send outreach letter to attempt contact.   Quinn Plowman RN,BSN,CCM [facility] Telephonic  [phone]

## 2017-05-30 NOTE — ED Triage Notes (Signed)
Pt reports SOB when she lies down.  Not experiencing it at this time "b/c I'm sitting up. "  Reports "they could not find anything at [place]"   Pt is not displaying any respiratory distress at this time.

## 2017-05-31 ENCOUNTER — Emergency Department (HOSPITAL_COMMUNITY): Payer: PPO

## 2017-05-31 ENCOUNTER — Encounter (HOSPITAL_COMMUNITY): Payer: Self-pay | Admitting: Physician Assistant

## 2017-05-31 ENCOUNTER — Other Ambulatory Visit: Payer: Self-pay

## 2017-05-31 ENCOUNTER — Inpatient Hospital Stay (HOSPITAL_COMMUNITY)
Admission: EM | Admit: 2017-05-31 | Discharge: 2017-06-03 | DRG: 291 | Disposition: A | Discharge status: Home / Self Care | Payer: PPO | Attending: Internal Medicine | Admitting: Internal Medicine

## 2017-05-31 DIAGNOSIS — E119 Type 2 diabetes mellitus without complications: Secondary | ICD-10-CM

## 2017-05-31 DIAGNOSIS — Z8673 Personal history of transient ischemic attack (TIA), and cerebral infarction without residual deficits: Secondary | ICD-10-CM

## 2017-05-31 DIAGNOSIS — R748 Abnormal levels of other serum enzymes: Secondary | ICD-10-CM

## 2017-05-31 DIAGNOSIS — I10 Essential (primary) hypertension: Secondary | ICD-10-CM | POA: Diagnosis not present

## 2017-05-31 DIAGNOSIS — I509 Heart failure, unspecified: Secondary | ICD-10-CM | POA: Diagnosis not present

## 2017-05-31 DIAGNOSIS — I5023 Acute on chronic systolic (congestive) heart failure: Secondary | ICD-10-CM

## 2017-05-31 DIAGNOSIS — R7989 Other specified abnormal findings of blood chemistry: Secondary | ICD-10-CM

## 2017-05-31 DIAGNOSIS — I5022 Chronic systolic (congestive) heart failure: Secondary | ICD-10-CM

## 2017-05-31 DIAGNOSIS — I420 Dilated cardiomyopathy: Secondary | ICD-10-CM

## 2017-05-31 DIAGNOSIS — I429 Cardiomyopathy, unspecified: Secondary | ICD-10-CM

## 2017-05-31 DIAGNOSIS — N183 Chronic kidney disease, stage 3 unspecified: Secondary | ICD-10-CM | POA: Diagnosis present

## 2017-05-31 DIAGNOSIS — R778 Other specified abnormalities of plasma proteins: Secondary | ICD-10-CM | POA: Diagnosis present

## 2017-05-31 DIAGNOSIS — E0800 Diabetes mellitus due to underlying condition with hyperosmolarity without nonketotic hyperglycemic-hyperosmolar coma (NKHHC): Secondary | ICD-10-CM | POA: Diagnosis not present

## 2017-05-31 DIAGNOSIS — E785 Hyperlipidemia, unspecified: Secondary | ICD-10-CM | POA: Diagnosis present

## 2017-05-31 DIAGNOSIS — D508 Other iron deficiency anemias: Secondary | ICD-10-CM

## 2017-05-31 DIAGNOSIS — D509 Iron deficiency anemia, unspecified: Secondary | ICD-10-CM | POA: Diagnosis present

## 2017-05-31 LAB — BASIC METABOLIC PANEL
Anion gap: 10 (ref 5–15)
BUN: 23 mg/dL — ABNORMAL HIGH (ref 6–20)
CO2: 23 mmol/L (ref 22–32)
CREATININE: 1.83 mg/dL — AB (ref 0.44–1.00)
Calcium: 8.8 mg/dL — ABNORMAL LOW (ref 8.9–10.3)
Chloride: 106 mmol/L (ref 101–111)
GFR, EST AFRICAN AMERICAN: 31 mL/min — AB (ref >60)
GFR, EST NON AFRICAN AMERICAN: 26 mL/min — AB (ref >60)
Glucose, Bld: 190 mg/dL — ABNORMAL HIGH (ref 65–99)
Potassium: 3.6 mmol/L (ref 3.5–5.1)
SODIUM: 139 mmol/L (ref 135–145)

## 2017-05-31 LAB — GLUCOSE, CAPILLARY: GLUCOSE-CAPILLARY: 138 mg/dL — AB (ref 65–99)

## 2017-05-31 LAB — I-STAT TROPONIN, ED: TROPONIN I, POC: 0.11 ng/mL — AB (ref 0.00–0.08)

## 2017-05-31 LAB — BRAIN NATRIURETIC PEPTIDE: B NATRIURETIC PEPTIDE 5: 1260.9 pg/mL — AB (ref 0.0–100.0)

## 2017-05-31 LAB — TSH: TSH: 3.154 u[IU]/mL (ref 0.350–4.500)

## 2017-05-31 LAB — TROPONIN I
TROPONIN I: 0.08 ng/mL — AB (ref <0.03)
TROPONIN I: 0.07 ng/mL — AB (ref <0.03)

## 2017-05-31 LAB — CBG MONITORING, ED: Glucose-Capillary: 82 mg/dL (ref 65–99)

## 2017-05-31 LAB — HEMOGLOBIN A1C
Hgb A1c MFr Bld: 6.7 % — ABNORMAL HIGH (ref 4.8–5.6)
Mean Plasma Glucose: 145.59 mg/dL

## 2017-05-31 LAB — D-DIMER, QUANTITATIVE: D-Dimer, Quant: 1.15 ug/mL-FEU — ABNORMAL HIGH (ref 0.00–0.50)

## 2017-05-31 MED ORDER — FERROUS FUMARATE 324 (106 FE) MG PO TABS
1.0000 | ORAL_TABLET | Freq: Two times a day (BID) | ORAL | Status: DC
  Filled 2017-05-31: qty 1

## 2017-05-31 MED ORDER — EZETIMIBE 10 MG PO TABS
10.0000 mg | ORAL_TABLET | Freq: Every evening | ORAL | Status: DC
  Administered 2017-06-01 – 2017-06-02 (×2): 10 mg via ORAL
  Filled 2017-05-31 (×2): qty 1

## 2017-05-31 MED ORDER — FUROSEMIDE 10 MG/ML IJ SOLN
20.0000 mg | Freq: Once | INTRAMUSCULAR | Status: AC
  Administered 2017-05-31: 20 mg via INTRAVENOUS

## 2017-05-31 MED ORDER — CARVEDILOL 12.5 MG PO TABS
6.2500 mg | ORAL_TABLET | Freq: Two times a day (BID) | ORAL | Status: DC
  Filled 2017-05-31: qty 1

## 2017-05-31 MED ORDER — FUROSEMIDE 10 MG/ML IJ SOLN: 20.0000 mg | Freq: Two times a day (BID) | INTRAMUSCULAR | Status: DC

## 2017-05-31 MED ORDER — CLOPIDOGREL BISULFATE 75 MG PO TABS
75.0000 mg | ORAL_TABLET | Freq: Every day | ORAL | Status: DC
  Administered 2017-05-31 – 2017-06-02 (×3): 75 mg via ORAL
  Filled 2017-05-31 (×3): qty 1

## 2017-05-31 MED ORDER — ONDANSETRON HCL 4 MG/2ML IJ SOLN: 4.0000 mg | Freq: Four times a day (QID) | INTRAMUSCULAR | Status: DC | PRN

## 2017-05-31 MED ORDER — HEPARIN SODIUM (PORCINE) 5000 UNIT/ML IJ SOLN
5000.0000 [IU] | Freq: Three times a day (TID) | INTRAMUSCULAR | Status: DC
  Administered 2017-05-31 – 2017-06-03 (×7): 5000 [IU] via SUBCUTANEOUS
  Filled 2017-05-31 (×7): qty 1

## 2017-05-31 MED ORDER — FE FUMARATE-B12-VIT C-FA-IFC PO CAPS
1.0000 | ORAL_CAPSULE | Freq: Two times a day (BID) | ORAL | Status: DC
  Administered 2017-05-31 – 2017-06-03 (×6): 1 via ORAL
  Filled 2017-05-31 (×7): qty 1

## 2017-05-31 MED ORDER — ACETAMINOPHEN 325 MG PO TABS: 650.0000 mg | ORAL_TABLET | ORAL | Status: DC | PRN

## 2017-05-31 MED ORDER — INSULIN ASPART 100 UNIT/ML ~~LOC~~ SOLN
0.0000 [IU] | Freq: Three times a day (TID) | SUBCUTANEOUS | Status: DC
Start: 2017-05-31 — End: 2017-06-03
  Administered 2017-06-01: 1 [IU] via SUBCUTANEOUS
  Administered 2017-06-01: 3 [IU] via SUBCUTANEOUS
  Administered 2017-06-01: 2 [IU] via SUBCUTANEOUS
  Administered 2017-06-02 (×2): 1 [IU] via SUBCUTANEOUS
  Administered 2017-06-02: 3 [IU] via SUBCUTANEOUS
  Administered 2017-06-03: 1 [IU] via SUBCUTANEOUS
  Administered 2017-06-03: 5 [IU] via SUBCUTANEOUS

## 2017-05-31 MED ORDER — SODIUM CHLORIDE 0.9% FLUSH
3.0000 mL | Freq: Two times a day (BID) | INTRAVENOUS | Status: DC
Start: 2017-05-31 — End: 2017-06-03
  Administered 2017-05-31 – 2017-06-03 (×5): 3 mL via INTRAVENOUS

## 2017-05-31 MED ORDER — FUROSEMIDE 10 MG/ML IJ SOLN
20.0000 mg | Freq: Two times a day (BID) | INTRAMUSCULAR | Status: DC
  Administered 2017-05-31 – 2017-06-01 (×3): 20 mg via INTRAVENOUS
  Filled 2017-05-31: qty 4
  Filled 2017-05-31 (×3): qty 2

## 2017-05-31 MED ORDER — VITAMIN D 1000 UNITS PO TABS
1000.0000 [IU] | ORAL_TABLET | Freq: Every day | ORAL | Status: DC
  Administered 2017-06-01 – 2017-06-03 (×3): 1000 [IU] via ORAL
  Filled 2017-05-31 (×3): qty 1

## 2017-05-31 MED ORDER — SODIUM CHLORIDE 0.9 % IV SOLN: 250.0000 mL | INTRAVENOUS | Status: DC | PRN

## 2017-05-31 MED ORDER — LABETALOL HCL 200 MG PO TABS
200.0000 mg | ORAL_TABLET | Freq: Two times a day (BID) | ORAL | Status: DC
  Filled 2017-05-31: qty 1

## 2017-05-31 MED ORDER — AMLODIPINE BESYLATE 5 MG PO TABS: 10.0000 mg | ORAL_TABLET | Freq: Every day | ORAL | Status: DC

## 2017-05-31 MED ORDER — FUROSEMIDE 10 MG/ML IJ SOLN
40.0000 mg | Freq: Once | INTRAMUSCULAR | Status: DC
  Filled 2017-05-31: qty 4

## 2017-05-31 MED ORDER — LOSARTAN POTASSIUM-HCTZ 100-25 MG PO TABS: 1.0000 | ORAL_TABLET | Freq: Every evening | ORAL | Status: DC

## 2017-05-31 MED ORDER — ATORVASTATIN CALCIUM 80 MG PO TABS
80.0000 mg | ORAL_TABLET | Freq: Every day | ORAL | Status: DC
  Administered 2017-05-31 – 2017-06-02 (×3): 80 mg via ORAL
  Filled 2017-05-31 (×3): qty 1

## 2017-05-31 MED ORDER — ASPIRIN EC 81 MG PO TBEC
81.0000 mg | DELAYED_RELEASE_TABLET | Freq: Every day | ORAL | Status: DC
  Administered 2017-05-31 – 2017-06-02 (×3): 81 mg via ORAL
  Filled 2017-05-31 (×3): qty 1

## 2017-05-31 NOTE — ED Notes (Signed)
Family at bedside. 

## 2017-05-31 NOTE — ED Provider Notes (Signed)
[facility] EMERGENCY DEPARTMENT Provider Note   CSN: [id number] Arrival date & time: 05/30/17  2151     History   Chief Complaint Chief Complaint  Patient presents with  . Shortness of Breath    HPI Yvonne Patel is a 72 y.o. female.  Pt does not generally have trouble with shortness of breath until the last 4 days.  She felt fine during the day but at night she would feel short of breath.  She heard a wheezing in her lungs.  Tonight however it did not occur.  Patient unfortunately had to wait several hours overnight before she was seen in the emergency room.  Patient was actually able to lie down and did not experience her symptoms again.  The history is provided by the patient.  Shortness of Breath  This is a new problem. The average episode lasts 5 days. The problem has been gradually worsening. Associated symptoms include wheezing. Pertinent negatives include no fever, no cough, no chest pain and no leg swelling. Risk factors: no smoking, DVT or PE. She has tried nothing for the symptoms. Associated medical issues do not include COPD, PE or DVT.  Patient went to an [place] walk-in clinic last evening.  She had a physical exam that was reassuring and was discharged.  Patient states she did not have any additional diagnostic testing.  Patient still concerned about her symptoms and called the nursing advice line.  They suggested she come to the emergency room. Patient does not have a history of chronic lung problems.  She does not have COPD or CHF.  No known history of coronary artery disease.  She has not had any recent leg swelling or tenderness.  Past Medical History:  Diagnosis Date  . Cardiomyopathy ([place])   . Coronary artery disease   . Hyperlipidemia   . Hypertension   . Neuromuscular disorder (HCC)    neuropathy  . PAD (peripheral artery disease) ([place])   . Peripheral vascular disease ([place])   . Refusal of blood transfusions as patient is Jehovah's Witness    . Retinal disease   . Syncope   . TIA (transient ischemic attack) 2010; 09/2011   denies residual on 03/11/2015  . Type II diabetes mellitus [facility])     Patient Active Problem List   Diagnosis Date Noted  . CKD (chronic kidney disease), stage III ([place]) 07/20/2016  . Diabetes mellitus with complication ([place])   . Elevated troponin   . Altered mental status 07/18/2016  . Carotid stenosis 07/14/2016  . Abnormal stress test 06/21/2016  . 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 H/O: CVA (cerebrovascular accident) 09/30/2011  . URINALYSIS, ABNORMAL 11/28/2009  . MICROALBUMINURIA 08/26/2009  . ANEMIA, IRON DEFICIENCY 12/29/2008  . Anemia 12/11/2008  . DIABETIC MACULAR EDEMA 12/11/2008  . PERIPHERAL VASCULAR DISEASE 10/01/2008  . KNEE PAIN, RIGHT 05/07/2008  . NOCTURIA 05/07/2008  . CAROTID BRUITS, BILATERAL 12/13/2007  . Diabetes mellitus ([place]) 11/02/2007  . DIABETIC PERIPHERAL NEUROPATHY 11/02/2007  . Hyperlipidemia 11/02/2007  . HTN (hypertension) 11/02/2007    Past Surgical History:  Procedure Laterality Date  . AORTIC ARCH ANGIOGRAPHY N/A 06/22/2016   Procedure: Aortic Arch Angiography;  Surgeon: [name], MD;  Location: [facility] CV LAB;  Service: Cardiovascular;  Laterality: N/A;  . CAROTID ANGIOGRAPHY N/A 06/22/2016   Procedure: Carotid Angiography;  Surgeon: [name], MD;  Location: [place] [facility];  Service: Cardiovascular;  Laterality: N/A;  . DILATION AND CURETTAGE OF UTERUS    . ENDARTERECTOMY  Left 07/14/2016   Procedure: ENDARTERECTOMY CAROTID LEFT;  Surgeon: Elam Dutch, MD;  Location: [facility];  Service: Vascular;  Laterality: Left;  . FEMORAL ARTERY STENT    . LEFT HEART CATH AND CORONARY ANGIOGRAPHY N/A 06/22/2016   Procedure: Left Heart Cath and Coronary Angiography;  Surgeon: [name], MD;  Location: [facility];  Service: Cardiovascular;  Laterality: N/A;  . LOWER EXTREMITY ANGIOGRAM N/A 06/05/2012   Procedure: LOWER EXTREMITY ANGIOGRAM;  Surgeon:  [name], MD;  Location[facility] CATH LAB;  Service: Cardiovascular;  Laterality: N/A;  . LOWER EXTREMITY ANGIOGRAM N/A 07/11/2012   Procedure: LOWER EXTREMITY ANGIOGRAM;  Surgeon: [name], MD;  Location: [facility] CATH LAB;  Service: Cardiovascular;  Laterality: N/A;  . LOWER EXTREMITY ANGIOGRAM N/A 09/19/2012   Procedure: LOWER EXTREMITY ANGIOGRAM;  Surgeon: [name], MD;  Location: [facility] CATH LAB;  Service: Cardiovascular;  Laterality: N/A;  . PATCH ANGIOPLASTY Left 07/14/2016   Procedure: PATCH ANGIOPLASTY;  Surgeon: Elam Dutch, MD;  Location: [facility];  Service: Vascular;  Laterality: Left;  . PERIPHERAL VASCULAR CATHETERIZATION N/A 03/11/2015   Procedure: Renal Angiography;  Surgeon: [name], MD;  Location: [place] CV LAB;  Service: Cardiovascular;  Laterality: N/A;  . RENAL ANGIOGRAM Left 06/05/2012   Procedure: RENAL ANGIOGRAM;  Surgeon: [name], MD;  Location: [facility] CATH LAB;  Service: Cardiovascular;  Laterality: Left;  . RENAL ANGIOGRAPHY  06/22/2016   Procedure: Renal Angiography;  Surgeon: [name], MD;  Location: [place] CV LAB;  Service: Cardiovascular;;  . RENAL ARTERY ANGIOPLASTY Right 03/11/2015  . TUBAL LIGATION    . VAGINAL HYSTERECTOMY       OB History   None      Home Medications    Prior to Admission medications   Medication Sig Start Date End Date Taking? Authorizing Provider  amLODipine (NORVASC) 10 MG tablet Take 10 mg by mouth daily at 12 noon.    Yes [provider]  aspirin EC 81 MG tablet Take 81 mg by mouth at bedtime.    Yes [provider]  atorvastatin (LIPITOR) 80 MG tablet Take 80 mg by mouth daily at 6 PM.  06/01/16  Yes [provider]  carvedilol (COREG) 6.25 MG tablet Take 6.25 mg by mouth 2 (two) times daily with a meal.   Yes [provider]  cholecalciferol (VITAMIN D) 1000 units tablet Take 1,000 Units by mouth daily.   Yes [provider]  clopidogrel (PLAVIX) 75 MG tablet  Take 75 mg by mouth at bedtime.    Yes [provider]  ezetimibe (ZETIA) 10 MG tablet Take 10 mg by mouth every evening. 06/01/16  Yes [provider]  Ferrous Fumarate (HEMOCYTE - 106 MG FE) 324 (106 Fe) MG TABS tablet Take 1 tablet (106 mg of iron total) by mouth 2 (two) times daily. 07/24/16  Yes Lama, Marge Duncans, MD  insulin degludec (TRESIBA FLEXTOUCH) 100 UNIT/ML SOPN FlexTouch Pen Inject 15 Units into the skin every morning.    Yes [provider]  labetalol (NORMODYNE) 200 MG tablet Take 200 mg by mouth 2 (two) times daily.    Yes [provider]  losartan-hydrochlorothiazide (HYZAAR) 100-25 MG tablet Take 1 tablet by mouth every evening.  12/10/14  Yes [provider]  metFORMIN (GLUCOPHAGE) 500 MG tablet Take 2 tablets (1,000 mg total) by mouth 2 (two) times daily. 06/24/16  Yes [name], MD    Family History Family History  Problem Relation Age of Onset  . Breast cancer Neg Hx     Social History Social History   Tobacco Use  . Smoking status: Former Smoker    Packs/day: 0.50    Years: 30.00    Pack years: 15.00    Types: Cigarettes    Start date: 03/28/1965    Last attempt to quit: 03/31/1975    Years since quitting: 42.1  . Smokeless tobacco: Never Used  Substance Use Topics  . Alcohol use: Yes    Comment: 03/11/2015 'drink at gatherings a couple times/year"  . Drug use: No     Allergies   No known allergies   Review of Systems Review of Systems  Constitutional: Negative for fever.  Respiratory: Positive for shortness of breath and wheezing. Negative for cough.   Cardiovascular: Negative for chest pain and leg swelling.  All other systems reviewed and are negative.    Physical Exam Updated Vital Signs BP (!) 105/53   Pulse 71   Temp 97.9 F (36.6 C) (Oral)   Resp 19   SpO2 100%   Physical Exam  Constitutional: She appears well-developed and well-nourished. No distress.  HENT:  Head: Normocephalic and  atraumatic.  Right Ear: External ear normal.  Left Ear: External ear normal.  Eyes: Conjunctivae are normal. Right eye exhibits no discharge. Left eye exhibits no discharge. No scleral icterus.  Neck: Neck supple. No tracheal deviation present.  Cardiovascular: Normal rate, regular rhythm and intact distal pulses.  Pulmonary/Chest: Effort normal and breath sounds normal. No stridor. No respiratory distress. She has no wheezes. She has no rales.  Abdominal: Soft. Bowel sounds are normal. She exhibits no distension. There is no tenderness. There is no rebound and no guarding.  Musculoskeletal: She exhibits no edema or tenderness.  Neurological: She is alert. She has normal strength. No cranial nerve deficit (no facial droop, extraocular movements intact, no slurred speech) or sensory deficit. She exhibits normal muscle tone. She displays no seizure activity. Coordination normal.  Skin: Skin is warm and dry. No rash noted.  Psychiatric: She has a normal mood and affect.  Nursing note and vitals reviewed.    ED Treatments / Results  Labs (all labs ordered are listed, but only abnormal results are displayed) Labs Reviewed  BASIC METABOLIC PANEL - Abnormal; Notable for the following components:      Result Value   Glucose, Bld 190 (*)    BUN 23 (*)    Creatinine, Ser 1.83 (*)    Calcium 8.8 (*)    GFR calc non Af Amer 26 (*)    GFR calc Af Amer 31 (*)    All other components within normal limits  CBC - Abnormal; Notable for the following components:   RBC 3.51 (*)    Hemoglobin 11.0 (*)    HCT 33.1 (*)    Platelets 130 (*)    All other components within normal limits  BRAIN NATRIURETIC PEPTIDE - Abnormal; Notable for the following components:   B Natriuretic Peptide 1,260.9 (*)    All other components within normal limits  D-DIMER, QUANTITATIVE (NOT AT [facility]) - Abnormal; Notable for the following components:   D-Dimer, Quant 1.15 (*)    All other components within normal limits    I-STAT TROPONIN, ED - Abnormal; Notable for the following components:   Troponin i, poc 0.11 (*)    All other components within normal limits    EKG EKG Interpretation  Date/Time:  Monday May 30 2017 22:14:19  EDT Ventricular Rate:  68 PR Interval:  140 QRS Duration: 102 QT Interval:  432 QTC Calculation: 459 R Axis:   63 Text Interpretation:  Normal sinus rhythm Biatrial enlargement ST & T wave abnormality, consider inferior ischemia ST & T wave abnormality, consider anterolateral ischemia Abnormal ECG increased t wace changes inferior compared to [previous Confirmed by Charlesetta Shanks [phone]) on 05/30/2017 10:21:16 PM   Radiology Dg Chest 2 View  Result Date: 05/31/2017 CLINICAL DATA:  Shortness of breath. EXAM: CHEST - 2 VIEW COMPARISON:  07/22/2016. FINDINGS: Mediastinum and hilar structures normal. Cardiomegaly with mild pulmonary venous congestion. No evidence of pulmonary edema. Previously identified pulmonary edema noted on chest x-ray of 07/22/2016 has cleared. Small bilateral pleural effusions. No pneumothorax. Diffuse thoracic spine osteopenia degenerative change. IMPRESSION: Cardiomegaly with mild pulmonary venous congestion. Small bilateral pleural effusions. No evidence of pulmonary edema. Previously identified pulmonary edema noted on chest x-ray of 07/22/2016 has cleared. Electronically Signed   By: Marcello Moores  Register   On: 05/31/2017 08:27   Nm Pulmonary Vent And Perf (v/q Scan)  Result Date: 05/31/2017 CLINICAL DATA:  Suspected PE. Positive D-dimer. EXAM: NUCLEAR MEDICINE VENTILATION - PERFUSION LUNG SCAN TECHNIQUE: Ventilation images were obtained in multiple projections using inhaled aerosol Tc-61m DTPA. Perfusion images were obtained in multiple projections after intravenous injection of Tc-69m-MAA. RADIOPHARMACEUTICALS:  31.7 mCi of Tc-11m DTPA aerosol inhalation and 4.5 mCi Tc29m-MAA IV COMPARISON:  Chest x-ray 2019. FINDINGS: Ventilation: No focal ventilation defect.  Perfusion: No wedge shaped peripheral perfusion defects to suggest acute pulmonary embolism. IMPRESSION: Negative exam.  No evidence of pulmonary embolus. Electronically Signed   By: Marcello Moores  Register   On: 05/31/2017 14:15    Procedures Procedures (including critical care time)  Medications Ordered in ED Medications  furosemide (LASIX) injection 40 mg (has no administration in time range)     Initial Impression / Assessment and Plan / ED Course  I have reviewed the triage vital signs and the nursing notes.  Pertinent labs & imaging results that were available during my care of the patient were reviewed by me and considered in my medical decision making (see chart for details).  Clinical Course as of May 31 1437  Tue May 31, 2017  0717 Mild anemia, improved from last value  CBC(!) [JK]  1941 Renal insufficiency noted.  NOt significantly changed  Basic metabolic panel(!) [JK]  7408 D-dimer and troponin are elevated.  VQ ordered due to renal insuff   [JK]  1436 VQ scan does not show evidence of a pulmonary embolism.   [XK]  4818 Patient's BNP is significantly elevated at over 1000.  She also has a slight increase in her troponin at 0.11.  Chest x-ray did not show overt pulmonary edema however she does have vascular congestion   [JK]    Clinical Course User Index [JK] Dorie Rank, MD   Patient presented to the emergency room with complaints of orthopnea.  She is workup is suggestive of an acute congestive heart failure exacerbation.  She does have a history of coronary artery disease.  She denies any acute pain.  She does have a slight increase in her troponin but I suspect this is related to her CHF exacerbation rather than acute cardiac ischemia.  Blood pressure has been on the low end.  Think she will benefit from gentle diuresis.  I have ordered a dose of Lasix.  I will consult with medical service for admission. Final Clinical Impressions(s) / ED Diagnoses   Final diagnoses:  Acute on chronic congestive heart failure, unspecified heart failure type [facility])    ED Discharge Orders    None       Dorie Rank, MD 05/31/17 1439

## 2017-05-31 NOTE — ED Notes (Signed)
CARB MODIFIED TRAY ORDERED

## 2017-05-31 NOTE — ED Notes (Signed)
Pt noted to be laying on chairs in the waiting room, no SOB noted

## 2017-05-31 NOTE — ED Notes (Signed)
Re-ordered carb modified meal tray

## 2017-05-31 NOTE — H&P (Signed)
History and Physical    Yvonne Patel TOI:[id number] DOB: Jun 24, 1945 DOA: 05/31/2017   PCP: Vernie Shanks, MD   Patient coming from:  Home    Chief Complaint: Shortness of breath   HPI: Yvonne Patel is a 72 y.o. female with medical history significant for hypertension, hyperlipidemia, CAD, PAD with carotid arteries involvement, status post left CEA in Jul 14, 2016, diabetes, prior history of TIA, CKD stage III, prior history of pneumonia, and history of chronic diastolic heart failure, with hospitalization in Jul 24, 2016, chronic anemia, Jehovah's weakness presenting yesterday to the emergency department with 4-day history of increasing shortness of breath, orthopnea.  Prior to this presentation, she had been seen at [facility] walk-in clinic, with reassuring physical exam, and did not have any diagnostic testing.  Today, her symptoms were worse on presentation. Denies rhinorrhea, productive sputum or hemoptysis. Denies fevers, chills, night sweats or mucositis Denies any chest pain, chest wall pain or palpitations.Denies any sick contacts or recent long distant travels. Denies any abdominal pain, and has increased appetite, admitting to food indiscretion. Denies  Nausea or vomiting . Denies dizziness or vertigo. Denies lower extremity swelling or calf pain  No confusion was reported. Denies any vision changes, double vision or headaches. Denies tobacco or recreational  drug use or ETOH    ED Course:  BP (!) 113/55   Pulse 71   Temp 97.9 F (36.6 C) (Oral)   Resp 16   SpO2 100%   BNP was 1260 VQ scan was negative for PE, despite a d-dimer of 1.15 0.9 was 0.11 in view of oxygen demand. Chest x-ray did not show overt pulmonary edema, however she did have vascular congestion Glucose 190 Creatinine 1.83 Hemoglobin 11, platelets 130 Because she is Lasix nave, she was given Lasix 20 mg IV, and awaiting diuresis.  This was given now.   Review of Systems:  As per HPI otherwise all other  systems reviewed and are negative  Past Medical History:  Diagnosis Date  . Cardiomyopathy ([place])   . Coronary artery disease   . Hyperlipidemia   . Hypertension   . Neuromuscular disorder (HCC)    neuropathy  . PAD (peripheral artery disease) ([place])   . Peripheral vascular disease ([place])   . Refusal of blood transfusions as patient is Jehovah's Witness   . Retinal disease   . Syncope   . TIA (transient ischemic attack) 2010; 09/2011   denies residual on 03/11/2015  . Type II diabetes mellitus ([place])     Past Surgical History:  Procedure Laterality Date  . AORTIC ARCH ANGIOGRAPHY N/A 06/22/2016   Procedure: Aortic Arch Angiography;  Surgeon: [name], MD;  Location: [place] [facility];  Service: Cardiovascular;  Laterality: N/A;  . CAROTID ANGIOGRAPHY N/A 06/22/2016   Procedure: Carotid Angiography;  Surgeon: [name], MD;  Location: [place] [facility];  Service: Cardiovascular;  Laterality: N/A;  . DILATION AND CURETTAGE OF UTERUS    . ENDARTERECTOMY Left 07/14/2016   Procedure: ENDARTERECTOMY CAROTID LEFT;  Surgeon: Elam Dutch, MD;  Location: [place];  Service: Vascular;  Laterality: Left;  . FEMORAL ARTERY STENT    . LEFT HEART CATH AND CORONARY ANGIOGRAPHY N/A 06/22/2016   Procedure: Left Heart Cath and Coronary Angiography;  Surgeon: [name], MD;  Location: [place] [facility];  Service: Cardiovascular;  Laterality: N/A;  . LOWER EXTREMITY ANGIOGRAM N/A 06/05/2012   Procedure: LOWER EXTREMITY ANGIOGRAM;  Surgeon: [name], MD;  Location: [facility]  CATH LAB;  Service: Cardiovascular;  Laterality: N/A;  . LOWER EXTREMITY ANGIOGRAM N/A 07/11/2012   Procedure: LOWER EXTREMITY ANGIOGRAM;  Surgeon: [name], MD;  Location: [facility] CATH LAB;  Service: Cardiovascular;  Laterality: N/A;  . LOWER EXTREMITY ANGIOGRAM N/A 09/19/2012   Procedure: LOWER EXTREMITY ANGIOGRAM;  Surgeon: [name], MD;  Location: [facility] CATH LAB;  Service: Cardiovascular;  Laterality: N/A;  . PATCH  ANGIOPLASTY Left 07/14/2016   Procedure: PATCH ANGIOPLASTY;  Surgeon: Elam Dutch, MD;  Location: [place];  Service: Vascular;  Laterality: Left;  . PERIPHERAL VASCULAR CATHETERIZATION N/A 03/11/2015   Procedure: Renal Angiography;  Surgeon: [name], MD;  Location: [place] [facility];  Service: Cardiovascular;  Laterality: N/A;  . RENAL ANGIOGRAM Left 06/05/2012   Procedure: RENAL ANGIOGRAM;  Surgeon: [name], MD;  Location: [facility] CATH LAB;  Service: Cardiovascular;  Laterality: Left;  . RENAL ANGIOGRAPHY  06/22/2016   Procedure: Renal Angiography;  Surgeon: [name], MD;  Location: [place] [facility];  Service: Cardiovascular;;  . RENAL ARTERY ANGIOPLASTY Right 03/11/2015  . TUBAL LIGATION    . VAGINAL HYSTERECTOMY      Social History Social History   Socioeconomic History  . Marital status: Divorced    Spouse name: Not on file  . Number of children: Not on file  . Years of education: Not on file  . Highest education level: Not on file  Occupational History  . Not on file  Social Needs  . Financial resource strain: Not on file  . Food insecurity:    Worry: Not on file    Inability: Not on file  . Transportation needs:    Medical: Not on file    Non-medical: Not on file  Tobacco Use  . Smoking status: Former Smoker    Packs/day: 0.50    Years: 30.00    Pack years: 15.00    Types: Cigarettes    Start date: 03/28/1965    Last attempt to quit: 03/31/1975    Years since quitting: 42.1  . Smokeless tobacco: Never Used  Substance and Sexual Activity  . Alcohol use: Yes    Comment: 03/11/2015 'drink at gatherings a couple times/year"  . Drug use: No  . Sexual activity: Never    Birth control/protection: Post-menopausal  Lifestyle  . Physical activity:    Days per week: Not on file    Minutes per session: Not on file  . Stress: Not on file  Relationships  . Social connections:    Talks on phone: Not on file    Gets together: Not on file    Attends religious service:  Not on file    Active member of club or organization: Not on file    Attends meetings of clubs or organizations: Not on file    Relationship status: Not on file  . Intimate partner violence:    Fear of current or ex partner: Not on file    Emotionally abused: Not on file    Physically abused: Not on file    Forced sexual activity: Not on file  Other Topics Concern  . Not on file  Social History Narrative  . Not on file     Allergies  Allergen Reactions  . No Known Allergies     Family History  Problem Relation Age of Onset  . Breast cancer Neg Hx       Prior to Admission medications   Medication Sig Start Date End Date Taking? Authorizing  Provider  amLODipine (NORVASC) 10 MG tablet Take 10 mg by mouth daily at 12 noon.    Yes [provider]  aspirin EC 81 MG tablet Take 81 mg by mouth at bedtime.    Yes [provider]  atorvastatin (LIPITOR) 80 MG tablet Take 80 mg by mouth daily at 6 PM.  06/01/16  Yes [provider]  carvedilol (COREG) 6.25 MG tablet Take 6.25 mg by mouth 2 (two) times daily with a meal.   Yes [provider]  cholecalciferol (VITAMIN D) 1000 units tablet Take 1,000 Units by mouth daily.   Yes [provider]  clopidogrel (PLAVIX) 75 MG tablet Take 75 mg by mouth at bedtime.    Yes [provider]  ezetimibe (ZETIA) 10 MG tablet Take 10 mg by mouth every evening. 06/01/16  Yes [provider]  Ferrous Fumarate (HEMOCYTE - 106 MG FE) 324 (106 Fe) MG TABS tablet Take 1 tablet (106 mg of iron total) by mouth 2 (two) times daily. 07/24/16  Yes Lama, Marge Duncans, MD  insulin degludec (TRESIBA FLEXTOUCH) 100 UNIT/ML SOPN FlexTouch Pen Inject 15 Units into the skin every morning.    Yes [provider]  labetalol (NORMODYNE) 200 MG tablet Take 200 mg by mouth 2 (two) times daily.    Yes [provider]  losartan-hydrochlorothiazide (HYZAAR) 100-25 MG tablet Take 1 tablet by mouth every  evening.  12/10/14  Yes [provider]  metFORMIN (GLUCOPHAGE) 500 MG tablet Take 2 tablets (1,000 mg total) by mouth 2 (two) times daily. 06/24/16  Yes [name], MD    Physical Exam:  Vitals:   05/31/17 0930 05/31/17 1115 05/31/17 1330 05/31/17 1445  BP: 114/63 127/77 (!) 105/53 (!) 113/55  Pulse: 72 71 71   Resp: 17 (!) 21 19 16   Temp:      TempSrc:      SpO2: 100% 100% 100%    Constitutional: NAD, calm, comfortable   Eyes: PERRL, lids and conjunctivae normal ENMT: Mucous membranes are moist, without exudate or lesions  Neck: normal, supple, no masses, no thyromegaly Respiratory: clear to auscultation bilaterally, no wheezing, positive crackles. Normal respiratory effort  Cardiovascular: Regular rate and rhythm,  Soft 1/6 murmur, rubs or gallops. No extremity edema. 2+ pedal pulses. No carotid bruits.  Abdomen: Soft, non tender, No hepatosplenomegaly. Bowel sounds positive.  Musculoskeletal: no clubbing / cyanosis. Moves all extremities Skin: no jaundice, No lesions.  Neurologic: Sensation intact  Strength equal in all extremities Psychiatric:   Alert and oriented x 3. Normal mood.     Labs on Admission: I have personally reviewed following labs and imaging studies  CBC: Recent Labs  Lab 05/30/17 2327  WBC 6.1  HGB 11.0*  HCT 33.1*  MCV 94.3  PLT 130*    Basic Metabolic Panel: Recent Labs  Lab 05/30/17 2327  NA 139  K 3.6  CL 106  CO2 23  GLUCOSE 190*  BUN 23*  CREATININE 1.83*  CALCIUM 8.8*    GFR: CrCl cannot be calculated (Unknown ideal weight.).  Liver Function Tests: No results for input(s): AST, ALT, ALKPHOS, BILITOT, PROT, ALBUMIN in the last 168 hours. No results for input(s): LIPASE, AMYLASE in the last 168 hours. No results for input(s): AMMONIA in the last 168 hours.  Coagulation Profile: No results for input(s): INR, PROTIME in the last 168 hours.  Cardiac Enzymes: No results for input(s): CKTOTAL, CKMB, CKMBINDEX, TROPONINI  in the last 168 hours.  BNP (last 3  results) No results for input(s): PROBNP in the last 8760 hours.  HbA1C: No results for input(s): HGBA1C in the last 72 hours.  CBG: No results for input(s): GLUCAP in the last 168 hours.  Lipid Profile: No results for input(s): CHOL, HDL, LDLCALC, TRIG, CHOLHDL, LDLDIRECT in the last 72 hours.  Thyroid Function Tests: No results for input(s): TSH, T4TOTAL, FREET4, T3FREE, THYROIDAB in the last 72 hours.  Anemia Panel: No results for input(s): VITAMINB12, FOLATE, FERRITIN, TIBC, IRON, RETICCTPCT in the last 72 hours.  Urine analysis:    Component Value Date/Time   COLORURINE YELLOW 07/21/2016 2243   APPEARANCEUR HAZY (A) 07/21/2016 2243   LABSPEC 1.011 07/21/2016 2243   PHURINE 6.0 07/21/2016 2243   GLUCOSEU NEGATIVE 07/21/2016 2243   HGBUR SMALL (A) 07/21/2016 2243   HGBUR trace-intact 12/18/2009 0836   BILIRUBINUR NEGATIVE 07/21/2016 2243   KETONESUR NEGATIVE 07/21/2016 2243   PROTEINUR 100 (A) 07/21/2016 2243   UROBILINOGEN 0.2 01/22/2012 1412   NITRITE NEGATIVE 07/21/2016 2243   LEUKOCYTESUR NEGATIVE 07/21/2016 2243    Sepsis Labs: @LABRCNTIP (procalcitonin:4,lacticidven:4) )No results found for this or any previous visit (from the past 240 hour(s)).   Radiological Exams on Admission: Dg Chest 2 View  Result Date: 05/31/2017 CLINICAL DATA:  Shortness of breath. EXAM: CHEST - 2 VIEW COMPARISON:  07/22/2016. FINDINGS: Mediastinum and hilar structures normal. Cardiomegaly with mild pulmonary venous congestion. No evidence of pulmonary edema. Previously identified pulmonary edema noted on chest x-ray of 07/22/2016 has cleared. Small bilateral pleural effusions. No pneumothorax. Diffuse thoracic spine osteopenia degenerative change. IMPRESSION: Cardiomegaly with mild pulmonary venous congestion. Small bilateral pleural effusions. No evidence of pulmonary edema. Previously identified pulmonary edema noted on chest x-ray of 07/22/2016 has  cleared. Electronically Signed   By: Marcello Moores  Register   On: 05/31/2017 08:27   Nm Pulmonary Vent And Perf (v/q Scan)  Result Date: 05/31/2017 CLINICAL DATA:  Suspected PE. Positive D-dimer. EXAM: NUCLEAR MEDICINE VENTILATION - PERFUSION LUNG SCAN TECHNIQUE: Ventilation images were obtained in multiple projections using inhaled aerosol Tc-43m DTPA. Perfusion images were obtained in multiple projections after intravenous injection of Tc-50m-MAA. RADIOPHARMACEUTICALS:  31.7 mCi of Tc-74m DTPA aerosol inhalation and 4.5 mCi Tc69m-MAA IV COMPARISON:  Chest x-ray 2019. FINDINGS: Ventilation: No focal ventilation defect. Perfusion: No wedge shaped peripheral perfusion defects to suggest acute pulmonary embolism. IMPRESSION: Negative exam.  No evidence of pulmonary embolus. Electronically Signed   By: Marcello Moores  Register   On: 05/31/2017 14:15    EKG: Independently reviewed.  Assessment/Plan Active Problems:   Diabetes mellitus (HCC)   Hyperlipidemia   ANEMIA, IRON DEFICIENCY   HTN (hypertension)   H/O: CVA (cerebrovascular accident)   CKD (chronic kidney disease), stage III (HCC)   Elevated troponin   Cardiomyopathy (HCC)   CHF exacerbation (HCC)      Acute respiratory failure due to  Acute on chronic Diastolic CHF: History of ICM   Weight 167 lbs BNP was 1260  Tn was 0.11 in view of oxygen demand.Chest x-ray did not show overt pulmonary edema, however she did have vascular congestion. VQ scan was negative for PE, despite a d-dimer of 1.15 Because she is Lasix nave, she was given Lasix 20 mg IV, and awaiting diuresis.  This was given now. Tele obs  Continue Lasix 20 mg bid for now  Obtain daily weights Monitor intake and output CHF order set  2 D echo  Last EF 45-50%, with really reduced systolic function, and grade 1 diastolic The patient will need to  follow-up with Dr. Einar Gip, after discharge Check TSH   Hypertension BP  134/91 Pulse 72    Continue home anti-hypertensive medications in am,  as BP prior to the visit was running low and she is requiring diuresis    Hyperlipidemia Continue home statins  Type II Diabetes Current blood sugar level is 190 Lab Results  Component Value Date   HGBA1C 6.3 (H) 07/19/2016  Hold oral agents  Hgb A1C SSI   Chronic kidney disease stage 3   baseline creatinine 1.5-1.6, today 1.83     Lab Results  Component Value Date   CREATININE 1.83 (H) 05/30/2017   CREATININE 1.56 (H) 07/24/2016   CREATININE 1.67 (H) 07/23/2016  HoldHCTZ and ACEI Repeat BMET in am  Hold NSAIDS  Anemia of chronic disease Hemoglobin on admission 11 at baseline   Repeat CBC in am  No transfusion is indicated as patient is Jehova's witness  Continue Iron supplements      DVT prophylaxis:  Heparin sq Code Status:    Full  Family Communication:  Discussed with patient Disposition Plan: Expect patient to be discharged to home after condition improves Consults called:    None  Admission status: Tele Obs    Sharene Butters, PA-C [facility]   Amion text  [phone]   05/31/2017, 3:23 PM

## 2017-05-31 NOTE — ED Notes (Signed)
Pt returned from radiology.

## 2017-05-31 NOTE — ED Notes (Addendum)
Pt received tray  Will draw labs after pt is done eating

## 2017-06-01 ENCOUNTER — Observation Stay (HOSPITAL_COMMUNITY): Payer: PPO

## 2017-06-01 DIAGNOSIS — Z8673 Personal history of transient ischemic attack (TIA), and cerebral infarction without residual deficits: Secondary | ICD-10-CM | POA: Diagnosis not present

## 2017-06-01 DIAGNOSIS — Z7982 Long term (current) use of aspirin: Secondary | ICD-10-CM | POA: Diagnosis not present

## 2017-06-01 DIAGNOSIS — I509 Heart failure, unspecified: Secondary | ICD-10-CM

## 2017-06-01 DIAGNOSIS — I130 Hypertensive heart and chronic kidney disease with heart failure and stage 1 through stage 4 chronic kidney disease, or unspecified chronic kidney disease: Secondary | ICD-10-CM | POA: Diagnosis present

## 2017-06-01 DIAGNOSIS — Z87891 Personal history of nicotine dependence: Secondary | ICD-10-CM | POA: Diagnosis not present

## 2017-06-01 DIAGNOSIS — E785 Hyperlipidemia, unspecified: Secondary | ICD-10-CM | POA: Diagnosis present

## 2017-06-01 DIAGNOSIS — Z7902 Long term (current) use of antithrombotics/antiplatelets: Secondary | ICD-10-CM | POA: Diagnosis not present

## 2017-06-01 DIAGNOSIS — Z79899 Other long term (current) drug therapy: Secondary | ICD-10-CM | POA: Diagnosis not present

## 2017-06-01 DIAGNOSIS — R748 Abnormal levels of other serum enzymes: Secondary | ICD-10-CM | POA: Diagnosis present

## 2017-06-01 DIAGNOSIS — E1151 Type 2 diabetes mellitus with diabetic peripheral angiopathy without gangrene: Secondary | ICD-10-CM | POA: Diagnosis present

## 2017-06-01 DIAGNOSIS — J9600 Acute respiratory failure, unspecified whether with hypoxia or hypercapnia: Secondary | ICD-10-CM | POA: Diagnosis present

## 2017-06-01 DIAGNOSIS — I2510 Atherosclerotic heart disease of native coronary artery without angina pectoris: Secondary | ICD-10-CM | POA: Diagnosis present

## 2017-06-01 DIAGNOSIS — I255 Ischemic cardiomyopathy: Secondary | ICD-10-CM | POA: Diagnosis present

## 2017-06-01 DIAGNOSIS — D638 Anemia in other chronic diseases classified elsewhere: Secondary | ICD-10-CM | POA: Diagnosis present

## 2017-06-01 DIAGNOSIS — R0602 Shortness of breath: Secondary | ICD-10-CM | POA: Diagnosis present

## 2017-06-01 DIAGNOSIS — Z794 Long term (current) use of insulin: Secondary | ICD-10-CM | POA: Diagnosis not present

## 2017-06-01 DIAGNOSIS — N183 Chronic kidney disease, stage 3 (moderate): Secondary | ICD-10-CM | POA: Diagnosis present

## 2017-06-01 DIAGNOSIS — Z531 Procedure and treatment not carried out because of patient's decision for reasons of belief and group pressure: Secondary | ICD-10-CM | POA: Diagnosis present

## 2017-06-01 DIAGNOSIS — E1122 Type 2 diabetes mellitus with diabetic chronic kidney disease: Secondary | ICD-10-CM | POA: Diagnosis present

## 2017-06-01 DIAGNOSIS — I429 Cardiomyopathy, unspecified: Secondary | ICD-10-CM | POA: Diagnosis present

## 2017-06-01 DIAGNOSIS — I5023 Acute on chronic systolic (congestive) heart failure: Secondary | ICD-10-CM | POA: Diagnosis not present

## 2017-06-01 DIAGNOSIS — I5033 Acute on chronic diastolic (congestive) heart failure: Secondary | ICD-10-CM | POA: Diagnosis present

## 2017-06-01 DIAGNOSIS — D509 Iron deficiency anemia, unspecified: Secondary | ICD-10-CM | POA: Diagnosis present

## 2017-06-01 DIAGNOSIS — E876 Hypokalemia: Secondary | ICD-10-CM | POA: Diagnosis present

## 2017-06-01 LAB — BASIC METABOLIC PANEL
Anion gap: 10 (ref 5–15)
BUN: 29 mg/dL — ABNORMAL HIGH (ref 6–20)
CALCIUM: 8.8 mg/dL — AB (ref 8.9–10.3)
CHLORIDE: 104 mmol/L (ref 101–111)
CO2: 25 mmol/L (ref 22–32)
CREATININE: 1.68 mg/dL — AB (ref 0.44–1.00)
GFR, EST AFRICAN AMERICAN: 34 mL/min — AB (ref >60)
GFR, EST NON AFRICAN AMERICAN: 29 mL/min — AB (ref >60)
Glucose, Bld: 133 mg/dL — ABNORMAL HIGH (ref 65–99)
Potassium: 3.2 mmol/L — ABNORMAL LOW (ref 3.5–5.1)
SODIUM: 139 mmol/L (ref 135–145)

## 2017-06-01 LAB — GLUCOSE, CAPILLARY
GLUCOSE-CAPILLARY: 134 mg/dL — AB (ref 65–99)
GLUCOSE-CAPILLARY: 136 mg/dL — AB (ref 65–99)
Glucose-Capillary: 230 mg/dL — ABNORMAL HIGH (ref 65–99)
Glucose-Capillary: 155 mg/dL — ABNORMAL HIGH (ref 65–99)

## 2017-06-01 LAB — CBC
HCT: 34.2 % — ABNORMAL LOW (ref 36.0–46.0)
Hemoglobin: 11.2 g/dL — ABNORMAL LOW (ref 12.0–15.0)
MCH: 30.4 pg (ref 26.0–34.0)
MCHC: 32.7 g/dL (ref 30.0–36.0)
MCV: 92.9 fL (ref 78.0–100.0)
PLATELETS: 135 10*3/uL — AB (ref 150–400)
RBC: 3.68 MIL/uL — ABNORMAL LOW (ref 3.87–5.11)
RDW: 13.1 % (ref 11.5–15.5)
WBC: 6.8 10*3/uL (ref 4.0–10.5)

## 2017-06-01 LAB — TROPONIN I: Troponin I: 0.08 ng/mL (ref <0.03)

## 2017-06-01 MED ORDER — LABETALOL HCL 200 MG PO TABS
200.0000 mg | ORAL_TABLET | Freq: Two times a day (BID) | ORAL | Status: DC
  Administered 2017-06-01: 200 mg via ORAL

## 2017-06-01 MED ORDER — POTASSIUM CHLORIDE CRYS ER 20 MEQ PO TBCR
40.0000 meq | EXTENDED_RELEASE_TABLET | Freq: Once | ORAL | Status: AC
  Administered 2017-06-01: 40 meq via ORAL
  Filled 2017-06-01: qty 2

## 2017-06-01 NOTE — Progress Notes (Addendum)
[facility] Progress Note  Patient: Yvonne Patel CVE:[id number]   PCP: Vernie Shanks, MD DOB: 1945/10/26   DOA: 05/31/2017   DOS: 06/01/2017   Date of Service: the patient was seen and examined on 06/01/2017  Subjective: Denies to have shortness of breath.  No dizziness no chest pain.  No nausea no vomiting.  Has leg swelling.  Although better than yesterday.  Brief hospital course: Pt. with PMH of HTN, HLD, CAD, PAD, type II DM, TIA, CKD stage III, chronic CHF; admitted on 05/31/2017, presented with complaint of shortness of breath, was found to have acute on chronic diastolic CHF. Currently further plan is continue IV Lasix.  Assessment and Plan:  Acute respiratory failure due to  Acute on chronic Diastolic CHF: History of ICM    VQ scan was negative for PE, Because she is Lasix nave with CKD, she was given Lasix 20 mg IV, Obtain daily weights Monitor intake and output 2 D echo  pending Last EF 45-50%, with really reduced systolic function, and grade 1 diastolic The patient will need to follow-up with Dr. Einar Gip, after discharge, scheduled in middle of April per patient.  Hypertension Continuing most of patient's home regimen other than her beta-blocker. It appears that the patient is actually taking Coreg as well as labetalol both at home. We will continue them pending echocardiogram.  Hyperlipidemia Continue home statins  Type II Diabetes  Continue sliding scale insulin right now hold oral hypoglycemic agent. Globin A1c 6.7, mildly uncontrolled.  Chronic Kidney disease stage 3   baseline creatinine 1.5-1.6, Hold HCTZ and ACEI Repeat BMET in am  Hold NSAIDS  Anemia of chronic disease  Hemoglobin on admission 11 at baseline   No transfusion is indicated as patient is Jehova's witness  Continue Iron supplements  Hypokalemia. Replacing orally.  Diet: Cardiac diet DVT Prophylaxis: subcutaneous Heparin  Advance goals of care discussion: full code  Family  Communication: no family was present at bedside, at the time of interview.  Disposition:  Discharge to home tomrorow.  Consultants: none Procedures: Echocardiogram   Antibiotics: Anti-infectives (From admission, onward)   None       Objective: Physical Exam: Vitals:   05/31/17 2108 06/01/17 0032 06/01/17 0402 06/01/17 1500  BP: 102/68 102/78 (!) 83/61 111/60  Pulse: 82 76 66 74  Resp: 18 18 18 18   Temp: 98.5 F (36.9 C) 99.5 F (37.5 C) 98.6 F (37 C) 99 F (37.2 C)  TempSrc: Oral Oral Oral Oral  SpO2: 100% 97% 100% 97%  Weight: 79.1 kg (174 lb 6.4 oz)  78.7 kg (173 lb 6.4 oz)   Height: 5\' 5"  (1.651 m)       Intake/Output Summary (Last 24 hours) at 06/01/2017 1805 Last data filed at 06/01/2017 1053 Gross per 24 hour  Intake 390 ml  Output 500 ml  Net -110 ml   Filed Weights   05/31/17 2108 06/01/17 0402  Weight: 79.1 kg (174 lb 6.4 oz) 78.7 kg (173 lb 6.4 oz)   General: Alert, Awake and Oriented to Time, Place and Person. Appear in mild distress, affect appropriate Eyes: PERRL, Conjunctiva normal ENT: Oral Mucosa clear moist. Neck: positive JVD, no Abnormal Mass Or lumps Cardiovascular: S1 and S2 Present, no Murmur, Peripheral Pulses Present Respiratory: normal respiratory effort, Bilateral Air entry equal and Decreased, no use of accessory muscle, basal Crackles, no wheezes Abdomen: Bowel Sound present, Soft and no tenderness, no hernia Skin: no redness, no Rash, no induration Extremities: trace Pedal edema,  no calf tenderness Neurologic: Grossly no focal neuro deficit. Bilaterally Equal motor strength  Data Reviewed: CBC: Recent Labs  Lab 05/30/17 2327 06/01/17 0313  WBC 6.1 6.8  HGB 11.0* 11.2*  HCT 33.1* 34.2*  MCV 94.3 92.9  PLT 130* 803*   Basic Metabolic Panel: Recent Labs  Lab 05/30/17 2327 06/01/17 0313  NA 139 139  K 3.6 3.2*  CL 106 104  CO2 23 25  GLUCOSE 190* 133*  BUN 23* 29*  CREATININE 1.83* 1.68*  CALCIUM 8.8* 8.8*     Liver Function Tests: No results for input(s): AST, ALT, ALKPHOS, BILITOT, PROT, ALBUMIN in the last 168 hours. No results for input(s): LIPASE, AMYLASE in the last 168 hours. No results for input(s): AMMONIA in the last 168 hours. Coagulation Profile: No results for input(s): INR, PROTIME in the last 168 hours. Cardiac Enzymes: Recent Labs  Lab 05/31/17 1559 05/31/17 2120 06/01/17 0313  TROPONINI 0.08* 0.07* 0.08*   BNP (last 3 results) No results for input(s): PROBNP in the last 8760 hours. CBG: Recent Labs  Lab 05/31/17 1552 05/31/17 2142 06/01/17 0826 06/01/17 1332 06/01/17 1652  GLUCAP 82 138* 134* 230* 155*   Studies: Dg Chest 2 View  Result Date: 06/01/2017 CLINICAL DATA:  Shortness of breath, wheezing, CHF EXAM: CHEST - 2 VIEW COMPARISON:  05/31/2017 FINDINGS: Cardiomegaly. Diffuse aortic calcifications. No visible aneurysm. Blunting of the right costophrenic angle could reflect small right pleural effusion. No confluent airspace opacity. Biapical scarring. No acute bony abnormality. IMPRESSION: Cardiomegaly. Suspect trace right pleural effusion. Aortic atherosclerosis. Electronically Signed   By: Rolm Baptise M.D.   On: 06/01/2017 09:50    Scheduled Meds: . aspirin EC  81 mg Oral QHS  . atorvastatin  80 mg Oral q1800  . cholecalciferol  1,000 Units Oral Daily  . clopidogrel  75 mg Oral QHS  . ezetimibe  10 mg Oral QPM  . ferrous [id number] C-folic acid  1 capsule Oral BID  . furosemide  20 mg Intravenous BID  . heparin  5,000 Units Subcutaneous Q8H  . insulin aspart  0-9 Units Subcutaneous TID WC  . sodium chloride flush  3 mL Intravenous Q12H   Continuous Infusions: . sodium chloride     PRN Meds: sodium chloride, acetaminophen, ondansetron (ZOFRAN) IV  Time spent: 30 minutes  Author: Berle Mull, MD [facility] Pager: [phone] 06/01/2017 6:05 PM  If 7PM-7AM, please contact night-coverage at [email or web address], password [facility]

## 2017-06-01 NOTE — Consult Note (Signed)
   [facility] Inpatient Consult   06/01/2017  Yvonne Patel 06-30-1945 [id number]  Patient evaluated for community based chronic disease management services with [facility] as a benefit of patient's [employer]. Spoke with patient at bedside to explain [facility]. Visit cut short due to MD in to see.  Primary Care Provider:  Dr. Yaakov Guthrie  Pharmacy: [facility]  Met with the patient again, at the bedside to explain services.  Patient states she has admitted with heart failure and she "does not remember having now. I just know I have been short of breath and can't do the things like I use to do."  Patient states, "I really need some help with this new diagnosis for me.  I know I have to weigh now and my daughter, [name],  has actually gone part time just to help me take my medicines and manage things around the house."  Visit cut short again because patient for 2D ECHO.  Consent form sign and folder with [facility] information was given with contact information.   Patient will receive post hospital discharge call and will be evaluated for monthly needs for assessments and disease process education.    Left contact information and [facility] literature at bedside. Of note, [facility] Care Management services does not replace or interfere with any services that are arranged by inpatient case management or social work.  For additional questions or referrals please contact:     Natividad Brood, RN BSN [facility] Liaison  [phone] business mobile phone Toll free office [phone]

## 2017-06-01 NOTE — Progress Notes (Signed)
  Echocardiogram 2D Echocardiogram has been performed.  Yvonne Patel [place] 06/01/2017, 12:54 PM

## 2017-06-01 NOTE — Plan of Care (Signed)
  Problem: Safety: Goal: Ability to remain free from injury will improve Outcome: Progressing   

## 2017-06-02 ENCOUNTER — Other Ambulatory Visit: Payer: Self-pay

## 2017-06-02 LAB — GLUCOSE, CAPILLARY
Glucose-Capillary: 124 mg/dL — ABNORMAL HIGH (ref 65–99)
Glucose-Capillary: 213 mg/dL — ABNORMAL HIGH (ref 65–99)
Glucose-Capillary: 130 mg/dL — ABNORMAL HIGH (ref 65–99)
Glucose-Capillary: 152 mg/dL — ABNORMAL HIGH (ref 65–99)

## 2017-06-02 LAB — BASIC METABOLIC PANEL
ANION GAP: 7 (ref 5–15)
BUN: 30 mg/dL — ABNORMAL HIGH (ref 6–20)
CALCIUM: 8.7 mg/dL — AB (ref 8.9–10.3)
CO2: 26 mmol/L (ref 22–32)
CREATININE: 1.87 mg/dL — AB (ref 0.44–1.00)
Chloride: 108 mmol/L (ref 101–111)
GFR calc Af Amer: 30 mL/min — ABNORMAL LOW (ref >60)
GFR, EST NON AFRICAN AMERICAN: 26 mL/min — AB (ref >60)
Glucose, Bld: 124 mg/dL — ABNORMAL HIGH (ref 65–99)
Potassium: 3.5 mmol/L (ref 3.5–5.1)
Sodium: 141 mmol/L (ref 135–145)

## 2017-06-02 LAB — CBC
HCT: 34 % — ABNORMAL LOW (ref 36.0–46.0)
Hemoglobin: 11.1 g/dL — ABNORMAL LOW (ref 12.0–15.0)
MCH: 30.5 pg (ref 26.0–34.0)
MCHC: 32.6 g/dL (ref 30.0–36.0)
MCV: 93.4 fL (ref 78.0–100.0)
PLATELETS: 138 10*3/uL — AB (ref 150–400)
RBC: 3.64 MIL/uL — ABNORMAL LOW (ref 3.87–5.11)
RDW: 13 % (ref 11.5–15.5)
WBC: 5.7 10*3/uL (ref 4.0–10.5)

## 2017-06-02 LAB — ECHOCARDIOGRAM COMPLETE
Height: 65 in
WEIGHTICAEL: 2774.4 [oz_av]

## 2017-06-02 LAB — MAGNESIUM: MAGNESIUM: 1.8 mg/dL (ref 1.7–2.4)

## 2017-06-02 MED ORDER — SODIUM CHLORIDE 0.9 % IV BOLUS
500.0000 mL | Freq: Once | INTRAVENOUS | Status: AC
  Administered 2017-06-02: 500 mL via INTRAVENOUS

## 2017-06-02 NOTE — Progress Notes (Signed)
Patient resting quietly.

## 2017-06-02 NOTE — Progress Notes (Signed)
This encounter was created in error - please disregard.

## 2017-06-02 NOTE — Progress Notes (Signed)
Orthostatic BP reported low by student nurse. Manual BP obtained; SBP in 70s. Patient reports no symptoms. MD text paged.

## 2017-06-02 NOTE — Progress Notes (Signed)
[facility] Progress Note  Patient: Yvonne Patel URK:[id number]   PCP: Vernie Shanks, MD DOB: 02-08-1946   DOA: 05/31/2017   DOS: 06/02/2017   Date of Service: the patient was seen and examined on 06/02/2017  Subjective: feeling better, no chest pain, no fever, no shortnes of breath, dizziness, nausea, vomiting, diarrhea.  Brief hospital course: Pt. with PMH of HTN, HLD, CAD, PAD, type II DM, TIA, CKD stage III, chronic CHF; admitted on 05/31/2017, presented with complaint of shortness of breath, was found to have acute on chronic diastolic CHF. Currently further plan is close monitoring for hypotension.  Assessment and Plan: Acute respiratory failure due to Acute on chronic Diastolic GBT:DVVOHYW of ICM  VQ scan was negative for PE, Started on IV Lasix twice daily 20 mg.  renal function about the same, Appears euvolemic for now. 2 D Echocardiogram 40-45% EF with grade 2 diastolic dysfunction. Last EF 45-50%, with really reduced systolic function, and grade 1 diastolic The patient will need to follow-up with Dr. Einar Gip, after discharge, scheduled in middle of April per patient.  Hypertension Hypotension  ?Overdiuresis It appears that the patient is actually taking Coreg as well as labetalol both at home. Currently both are on hold. Also holding all other blood pressure medication given hypotension.  Patient received 500 cc normal saline bolus. Despite blood pressure in 80s patient is asymptomatic and therefore will hold off on further IV fluids.  Hyperlipidemia Continue home statins  Type II Diabetes  Continue sliding scale insulin right now hold oral hypoglycemic agent. Globin A1c 6.7, mildly uncontrolled.  Chronic Kidney disease stage3 baseline creatinine 1.5-1.6, Hold HCTZand ACEI Repeat BMET in am  Hold NSAIDS  Anemia of chronic disease Hemoglobin on admission 11 at baseline No transfusion is indicatedas patient is Jehova's witness Continue Iron  supplements  Hypokalemia. Replaced, recheck tomorrow.  Diet: cardiac diet DVT Prophylaxis: subcutaneous Heparin  Advance goals of care discussion: full code  Family Communication: family was present at bedside, at the time of interview. The pt provided permission to discuss medical plan with the family. Opportunity was given to ask question and all questions were answered satisfactorily.   Disposition:  Discharge to home tomorrow.  Consultants: none Procedures: Echocardiogram   Antibiotics: Anti-infectives (From admission, onward)   None       Objective: Physical Exam: Vitals:   06/02/17 1124 06/02/17 1137 06/02/17 1154 06/02/17 1448  BP: (!) 62/42 (!) 72/52 (!) 79/54 (!) 88/24  Pulse:   70   Resp:   16   Temp:   98.5 F (36.9 C)   TempSrc:   Oral   SpO2:   99%   Weight:      Height:        Intake/Output Summary (Last 24 hours) at 06/02/2017 1601 Last data filed at 06/02/2017 1420 Gross per 24 hour  Intake 960 ml  Output 1400 ml  Net -440 ml   Filed Weights   05/31/17 2108 06/01/17 0402 06/02/17 0536  Weight: 79.1 kg (174 lb 6.4 oz) 78.7 kg (173 lb 6.4 oz) 77.7 kg (171 lb 3.2 oz)   General: Alert, Awake and Oriented to Time, Place and Person. Appear in mild distress, affect appropriate Eyes: PERRL, Conjunctiva normal ENT: Oral Mucosa clear moist. Neck: no JVD, no Abnormal Mass Or lumps Cardiovascular: S1 and S2 Present, no Murmur, Peripheral Pulses Present Respiratory: normal respiratory effort, Bilateral Air entry equal and Decreased, no use of accessory muscle, Clear to Auscultation, no Crackles, no wheezes Abdomen:  Bowel Sound present, Soft and no tenderness, no hernia Skin: no redness, no Rash, no induration Extremities: no Pedal edema, no calf tenderness Neurologic: Grossly no focal neuro deficit. Bilaterally Equal motor strength  Data Reviewed: CBC: Recent Labs  Lab 05/30/17 2327 06/01/17 0313 06/02/17 0627  WBC 6.1 6.8 5.7  HGB 11.0* 11.2*  11.1*  HCT 33.1* 34.2* 34.0*  MCV 94.3 92.9 93.4  PLT 130* 135* 703*   Basic Metabolic Panel: Recent Labs  Lab 05/30/17 2327 06/01/17 0313 06/02/17 0627  NA 139 139 141  K 3.6 3.2* 3.5  CL 106 104 108  CO2 23 25 26   GLUCOSE 190* 133* 124*  BUN 23* 29* 30*  CREATININE 1.83* 1.68* 1.87*  CALCIUM 8.8* 8.8* 8.7*  MG  --   --  1.8    Liver Function Tests: No results for input(s): AST, ALT, ALKPHOS, BILITOT, PROT, ALBUMIN in the last 168 hours. No results for input(s): LIPASE, AMYLASE in the last 168 hours. No results for input(s): AMMONIA in the last 168 hours. Coagulation Profile: No results for input(s): INR, PROTIME in the last 168 hours. Cardiac Enzymes: Recent Labs  Lab 05/31/17 1559 05/31/17 2120 06/01/17 0313  TROPONINI 0.08* 0.07* 0.08*   BNP (last 3 results) No results for input(s): PROBNP in the last 8760 hours. CBG: Recent Labs  Lab 06/01/17 1332 06/01/17 1652 06/01/17 2207 06/02/17 0743 06/02/17 1152  GLUCAP 230* 155* 136* 124* 213*   Studies: No results found.  Scheduled Meds: . aspirin EC  81 mg Oral QHS  . atorvastatin  80 mg Oral q1800  . cholecalciferol  1,000 Units Oral Daily  . clopidogrel  75 mg Oral QHS  . ezetimibe  10 mg Oral QPM  . ferrous [id number] C-folic acid  1 capsule Oral BID  . heparin  5,000 Units Subcutaneous Q8H  . insulin aspart  0-9 Units Subcutaneous TID WC  . sodium chloride flush  3 mL Intravenous Q12H   Continuous Infusions: . sodium chloride     PRN Meds: sodium chloride, acetaminophen, ondansetron (ZOFRAN) IV  Time spent: 35 minutes  Author: Berle Mull, MD [facility] Pager: [phone] 06/02/2017 4:01 PM  If 7PM-7AM, please contact night-coverage at [email or web address], password [facility]

## 2017-06-02 NOTE — Progress Notes (Signed)
Blood pressure checked in both arms. Both are WNL.

## 2017-06-02 NOTE — Patient Outreach (Signed)
[place] [facility]) Care Management  06/02/2017  AGUSTA HACKENBERG 28-Apr-1945 [id number]  Nurse call line Nurse call line date; 05/30/17 REFERRAL REASON; wheezing, shortness of breath Nurse call line recommendation: see physician within 4 hours.  Sent to urgent care   Upon notice patient has been referred to [facility] health coach, Lazaro Arms by hospital liaison, Natividad Brood.  RNCM notified health coach patient has contacted Nurse advise line and requested follow up on nurse line referral once patient is reached.   PLAN; No further follow up needed by this RNCM at this time. Quinn Plowman RN,BSN,CCM [facility] Telephonic  [phone]

## 2017-06-03 ENCOUNTER — Ambulatory Visit: Payer: Self-pay

## 2017-06-03 ENCOUNTER — Other Ambulatory Visit: Payer: Self-pay | Admitting: Licensed Clinical Social Worker

## 2017-06-03 LAB — CBC
HEMATOCRIT: 33.2 % — AB (ref 36.0–46.0)
HEMOGLOBIN: 10.8 g/dL — AB (ref 12.0–15.0)
MCH: 30.6 pg (ref 26.0–34.0)
MCHC: 32.5 g/dL (ref 30.0–36.0)
MCV: 94.1 fL (ref 78.0–100.0)
Platelets: 139 10*3/uL — ABNORMAL LOW (ref 150–400)
RBC: 3.53 MIL/uL — ABNORMAL LOW (ref 3.87–5.11)
RDW: 13.1 % (ref 11.5–15.5)
WBC: 5.7 10*3/uL (ref 4.0–10.5)

## 2017-06-03 LAB — BASIC METABOLIC PANEL
ANION GAP: 7 (ref 5–15)
BUN: 28 mg/dL — AB (ref 6–20)
CO2: 24 mmol/L (ref 22–32)
Calcium: 8.8 mg/dL — ABNORMAL LOW (ref 8.9–10.3)
Chloride: 108 mmol/L (ref 101–111)
Creatinine, Ser: 1.59 mg/dL — ABNORMAL HIGH (ref 0.44–1.00)
GFR calc Af Amer: 36 mL/min — ABNORMAL LOW (ref >60)
GFR calc non Af Amer: 31 mL/min — ABNORMAL LOW (ref >60)
GLUCOSE: 142 mg/dL — AB (ref 65–99)
POTASSIUM: 3.8 mmol/L (ref 3.5–5.1)
Sodium: 139 mmol/L (ref 135–145)

## 2017-06-03 LAB — GLUCOSE, CAPILLARY
Glucose-Capillary: 139 mg/dL — ABNORMAL HIGH (ref 65–99)
Glucose-Capillary: 267 mg/dL — ABNORMAL HIGH (ref 65–99)

## 2017-06-03 MED ORDER — FUROSEMIDE 20 MG PO TABS: 20.0000 mg | ORAL_TABLET | Freq: Every day | ORAL | 0 refills | Status: AC | PRN

## 2017-06-03 MED ORDER — CARVEDILOL 3.125 MG PO TABS: 3.1250 mg | ORAL_TABLET | Freq: Two times a day (BID) | ORAL | 0 refills | Status: AC

## 2017-06-03 NOTE — Progress Notes (Addendum)
Pt got discharged to home, discharge instructions provided and patient showed understanding to it, IV taken out,Telemonitor DC,pt left unit in wheelchair with all of the belongings accompanied with a family member (Daughter)  Raeqwon Lux, RN 

## 2017-06-03 NOTE — Patient Outreach (Signed)
[place] [facility]) Care Management  06/03/2017  Yvonne Patel [birth year] [id number]  Assessment- CSW completed initial outreach attempt after receiving new referral on patient for in home assistance resources. CSW was able to reach patient successfully by phone. HIPPA verifications provided. CSW introduced self, reason for call and of [facility] social work services. Patient shares that she no social work needs at this time other than wanting information on how her daughter can become her caregiver and be paid for it. CSW informed patient that [place] has a long wait list (over a year) for their [employer] program which allows a patient with Medicaid to choose who they want and wish to be their caregiver and DSS will pay them. CSW informed patient that daughter does not need to have their CNA license or have any other certification or experience to do this service. Patient does not currently have Medicaid but has had it in the past and understands that she must re-enroll in service before getting on the wait list for the Cap Choice Program with DSS. Patient understands that she will need to go to DSS to reapply for services and patient feels comfortable with doing so. Patient denies any other social work needs. CSW stated that if patient is not able to get Medicaid then patient can get on the wait list for In [facility] which is a program created for those who cannot afford an aide and do not have Medicaid. Patient requested that CSW mail her out resources on [employer] and In [place]. Patient denies any transportation issues at this time and reports that she is currently at her pharmacy picking up her prescribed medications. Patient denies issues with affording medications. CSW will mail requested resources to patient and follow up within 2 weeks to ensure that resources were successfully gained. CSW will not open case at this time.  [name], BSW, MSW,  [email or web address] Phone: [phone] Fax: [phone]

## 2017-06-03 NOTE — Discharge Instructions (Signed)
Heart Failure °Heart failure means your heart has trouble pumping blood. This makes it hard for your body to work well. Heart failure is usually a long-term (chronic) condition. You must take good care of yourself and follow your doctor's treatment plan. °Follow these instructions at home: °· Take your heart medicine as told by your doctor. °? Do not stop taking medicine unless your doctor tells you to. °? Do not skip any dose of medicine. °? Refill your medicines before they run out. °? Take other medicines only as told by your doctor or pharmacist. °· Stay active if told by your doctor. The elderly and people with severe heart failure should talk with a doctor about physical activity. °· Eat heart-healthy foods. Choose foods that are without trans fat and are low in saturated fat, cholesterol, and salt (sodium). This includes fresh or frozen fruits and vegetables, fish, lean meats, fat-free or low-fat dairy foods, whole grains, and high-fiber foods. Lentils and dried peas and beans (legumes) are also good choices. °· Limit salt if told by your doctor. °· Cook in a healthy way. Roast, grill, broil, bake, poach, steam, or stir-fry foods. °· Limit fluids as told by your doctor. °· Weigh yourself every morning. Do this after you pee (urinate) and before you eat breakfast. Write down your weight to give to your doctor. °· Take your blood pressure and write it down if your doctor tells you to. °· Ask your doctor how to check your pulse. Check your pulse as told. °· Lose weight if told by your doctor. °· Stop smoking or chewing tobacco. Do not use gum or patches that help you quit without your doctor's approval. °· Schedule and go to doctor visits as told. °· Nonpregnant women should have no more than 1 drink a day. Men should have no more than 2 drinks a day. Talk to your doctor about drinking alcohol. °· Stop illegal drug use. °· Stay current with shots (immunizations). °· Manage your health conditions as told by your  doctor. °· Learn to manage your stress. °· Rest when you are tired. °· If it is really hot outside: °? Avoid intense activities. °? Use air conditioning or fans, or get in a cooler place. °? Avoid caffeine and alcohol. °? Wear loose-fitting, lightweight, and light-colored clothing. °· If it is really cold outside: °? Avoid intense activities. °? Layer your clothing. °? Wear mittens or gloves, a hat, and a scarf when going outside. °? Avoid alcohol. °· Learn about heart failure and get support as needed. °· Get help to maintain or improve your quality of life and your ability to care for yourself as needed. °Contact a doctor if: °· You gain weight quickly. °· You are more short of breath than usual. °· You cannot do your normal activities. °· You tire easily. °· You cough more than normal, especially with activity. °· You have any or more puffiness (swelling) in areas such as your hands, feet, ankles, or belly (abdomen). °· You cannot sleep because it is hard to breathe. °· You feel like your heart is beating fast (palpitations). °· You get dizzy or light-headed when you stand up. °Get help right away if: °· You have trouble breathing. °· There is a change in mental status, such as becoming less alert or not being able to focus. °· You have chest pain or discomfort. °· You faint. °This information is not intended to replace advice given to you by your health care provider. Make sure you   discuss any questions you have with your health care provider. °Document Released: 12/02/2007 Document Revised: 07/31/2015 Document Reviewed: 04/10/2012 °[employer]vier Interactive Patient Education © 2017 [employer] ° °

## 2017-06-05 NOTE — Discharge Summary (Signed)
[facility] Discharge Summary   Patient: Yvonne Patel BZJ:[id number]   PCP: Vernie Shanks, MD DOB: September 25, 1945   Date of admission: 05/31/2017   Date of discharge: 06/03/2017    Discharge Diagnoses:  Principal Problem:   CHF exacerbation ([place]) Active Problems:   Diabetes mellitus ([place])   Hyperlipidemia   ANEMIA, IRON DEFICIENCY   HTN (hypertension)   H/O: CVA (cerebrovascular accident)   CKD (chronic kidney disease), stage III (HCC)   Elevated troponin   Cardiomyopathy ([place])   Acute CHF (congestive heart failure) ([place])   Admitted From: home Disposition:  home  Recommendations for Outpatient Follow-up:  1. With PCP in 1 week, cardiology in 2 weeks.  Follow-up Information    Schedule an appointment as soon as possible for a visit with Vernie Shanks, MD.   Specialty:  Family Medicine Contact information: [place] [address] [phone]          Diet recommendation: Cardiac diet  Activity: The patient is advised to gradually reintroduce usual activities.  Discharge Condition: good  Code Status: Full code  History of present illness: As per the H and P dictated on admission, "Yvonne Patel is a 72 y.o. female with medical history significant for hypertension, hyperlipidemia, CAD, PAD with carotid arteries involvement, status post left CEA in Jul 14, 2016, diabetes, prior history of TIA, CKD stage III, prior history of pneumonia, and history of chronic diastolic heart failure, with hospitalization in Jul 24, 2016, chronic anemia, Jehovah's weakness presenting yesterday to the emergency department with 4-day history of increasing shortness of breath, orthopnea.  Prior to this presentation, she had been seen at [facility] walk-in clinic, with reassuring physical exam, and did not have any diagnostic testing.  Today, her symptoms were worse on presentation. Denies rhinorrhea, productive sputum or hemoptysis. Denies fevers, chills, night sweats or  mucositis Denies any chest pain, chest wall pain or palpitations.Denies any sick contacts or recent long distant travels. Denies any abdominal pain, and has increased appetite, admitting to food indiscretion. Denies  Nausea or vomiting . Denies dizziness or vertigo. Denies lower extremity swelling or calf pain  No confusion was reported. Denies any vision changes, double vision or headaches. Denies tobacco or recreational  drug use or ETOH."  Hospital Course:  Summary of her active problems in the hospital is as following. Acute respiratory failure due to Acute on chronic Diastolic UMP:NTIRWER of ICM  VQ scan was negative for PE, Started on IV Lasix twice daily 20 mg.  renal function about the same, Appears euvolemic for now.  Discharging on oral as needed Lasix. 2 D Echocardiogram 40-45% EF with grade 2 diastolic dysfunction. Last EF 45-50%, with really reduced systolic function, and grade 1 diastolic The patient will need to follow-up with Dr. Einar Gip, after discharge, scheduled in middle of April per patient.  Hypertension Hypotension  ?Overdiuresis It appears that the patient is actually taking Coreg as well as labetalol both at home. Currently both are on hold. Also holding all other blood pressure medication given hypotension.  Patient received 500 cc normal saline bolus. Pressure improved.  Discussed with cardiology recommend to discharge the patient on low-dose oral Coreg.  Patient will follow-up with PCP for further adjustment.  Hyperlipidemia Continue home statins  Type II Diabetes Continue sliding scale insulin right now hold oral hypoglycemic agent. Globin A1c 6.7, mildly uncontrolled.  ChronicKidney disease stage3 baseline creatinine 1.5-1.6, Hold HCTZand ACEI Hold NSAIDS  Anemia of chronic disease Hemoglobin on admission  11 at baseline No transfusion is indicatedas patient is Jehova's witness Continue Iron  supplements  Hypokalemia. Replaced  All other chronic medical condition were stable during the hospitalization.  Patient was ambulatory without any assistance. On the day of the discharge the patient's vitals were stable , and no other acute medical condition were reported by patient. the patient was felt safe to be discharge at home with family.  Procedures and Results:  Echocardiogram    Consultations:  none  DISCHARGE MEDICATION: Allergies as of 06/03/2017      Reactions   No Known Allergies       Medication List    STOP taking these medications   amLODipine 10 MG tablet Commonly known as:  NORVASC   cholecalciferol 1000 units tablet Commonly known as:  VITAMIN D   ezetimibe 10 MG tablet Commonly known as:  ZETIA   labetalol 200 MG tablet Commonly known as:  NORMODYNE   losartan-hydrochlorothiazide 100-25 MG tablet Commonly known as:  HYZAAR     TAKE these medications   aspirin EC 81 MG tablet Take 81 mg by mouth at bedtime.   atorvastatin 80 MG tablet Commonly known as:  LIPITOR Take 80 mg by mouth daily at 6 PM.   carvedilol 3.125 MG tablet Commonly known as:  COREG Take 1 tablet (3.125 mg total) by mouth 2 (two) times daily. What changed:    medication strength  how much to take  when to take this   clopidogrel 75 MG tablet Commonly known as:  PLAVIX Take 75 mg by mouth at bedtime.   Ferrous Fumarate 324 (106 Fe) MG Tabs tablet Commonly known as:  HEMOCYTE - 106 mg FE Take 1 tablet (106 mg of iron total) by mouth 2 (two) times daily.   furosemide 20 MG tablet Commonly known as:  LASIX Take 1 tablet (20 mg total) by mouth daily as needed. For weight gain of 3 lbs in 1 day or 5Lbs in 2 days.   metFORMIN 500 MG tablet Commonly known as:  GLUCOPHAGE Take 2 tablets (1,000 mg total) by mouth 2 (two) times daily.   TRESIBA FLEXTOUCH 100 UNIT/ML Sopn FlexTouch Pen Generic drug:  insulin degludec Inject 15 Units into the skin every  morning.      Allergies  Allergen Reactions  . No Known Allergies    Discharge Instructions    AMB Referral to [place] Management   Complete by:  As directed    Please assign patient to Health Coach for management of HF,  HTA member.  Her primary care provider does the transition of care call.  For questions, please contact:  Natividad Brood, RN BSN [facility] Liaison  [phone] business mobile phone Toll free office [phone]   Reason for consult:  Disease Management follow up   Diagnoses of:  Heart Failure   Expected date of contact:  1-3 days (reserved for hospital discharges)   AMB Referral to [place] Management   Complete by:  As directed    Please assign social worker for community resource information, states her daughter is trying to find where to go for information on getting her more assistance with helping her mother.  She has cut her work to part time.  Natividad Brood, RN BSN [facility] Liaison  [phone] business mobile phone Toll free office [phone]   Reason for consult:  Patient requesting resources   Diagnoses of:   Heart Failure Diabetes     Expected date  of contact:  1-3 days (reserved for hospital discharges)   Diet - low sodium heart healthy   Complete by:  As directed    Discharge instructions   Complete by:  As directed    It is important that you read following instructions as well as go over your medication list with RN to help you understand your care after this hospitalization.  Discharge Instructions: Please follow-up with PCP in one week  Please request your primary care physician to go over all Hospital Tests and Procedure/Radiological results at the follow up,  Please get all Hospital records sent to your PCP by signing hospital release before you go home.   Do not take more than prescribed Pain, Sleep and Anxiety Medications. You were cared for by a hospitalist during your hospital  stay. If you have any questions about your discharge medications or the care you received while you were in the hospital after you are discharged, you can call the unit and ask to speak with the hospitalist on call if the hospitalist that took care of you is not available.  Once you are discharged, your primary care physician will handle any further medical issues. Please note that NO REFILLS for any discharge medications will be authorized once you are discharged, as it is imperative that you return to your primary care physician (or establish a relationship with a primary care physician if you do not have one) for your aftercare needs so that they can reassess your need for medications and monitor your lab values. You Must read complete instructions/literature along with all the possible adverse reactions/side effects for all the Medicines you take and that have been prescribed to you. Take any new Medicines after you have completely understood and accept all the possible adverse reactions/side effects. Wear Seat belts while driving. If you have smoked or chewed Tobacco in the last 2 yrs please stop smoking and/or stop any Recreational drug use.   Increase activity slowly   Complete by:  As directed      Discharge Exam: Filed Weights   06/01/17 0402 06/02/17 0536 06/03/17 0405  Weight: 78.7 kg (173 lb 6.4 oz) 77.7 kg (171 lb 3.2 oz) 79.1 kg (174 lb 4.8 oz)   Vitals:   06/03/17 1125 06/03/17 1141  BP: (!) 126/57 (!) 122/58  Pulse: 73 76  Resp: 16   Temp: 98.3 F (36.8 C) 98.3 F (36.8 C)  SpO2: 99% 95%   General: Appear in no distress, no Rash; Oral Mucosa moist. Cardiovascular: S1 and S2 Present, no Murmur, no JVD Respiratory: Bilateral Air entry present and Clear to Auscultation, no Crackles, no wheezes Abdomen: Bowel Sound present, Soft and no tenderness Extremities: no Pedal edema, no calf tenderness Neurology: Grossly no focal neuro deficit.  The results of significant  diagnostics from this hospitalization (including imaging, microbiology, ancillary and laboratory) are listed below for reference.    Significant Diagnostic Studies: Dg Chest 2 View  Result Date: 06/01/2017 CLINICAL DATA:  Shortness of breath, wheezing, CHF EXAM: CHEST - 2 VIEW COMPARISON:  05/31/2017 FINDINGS: Cardiomegaly. Diffuse aortic calcifications. No visible aneurysm. Blunting of the right costophrenic angle could reflect small right pleural effusion. No confluent airspace opacity. Biapical scarring. No acute bony abnormality. IMPRESSION: Cardiomegaly. Suspect trace right pleural effusion. Aortic atherosclerosis. Electronically Signed   By: Rolm Baptise M.D.   On: 06/01/2017 09:50   Dg Chest 2 View  Result Date: 05/31/2017 CLINICAL DATA:  Shortness of breath. EXAM: CHEST - 2 VIEW COMPARISON:  07/22/2016. FINDINGS: Mediastinum and hilar structures normal. Cardiomegaly with mild pulmonary venous congestion. No evidence of pulmonary edema. Previously identified pulmonary edema noted on chest x-ray of 07/22/2016 has cleared. Small bilateral pleural effusions. No pneumothorax. Diffuse thoracic spine osteopenia degenerative change. IMPRESSION: Cardiomegaly with mild pulmonary venous congestion. Small bilateral pleural effusions. No evidence of pulmonary edema. Previously identified pulmonary edema noted on chest x-ray of 07/22/2016 has cleared. Electronically Signed   By: Marcello Moores  Register   On: 05/31/2017 08:27   Nm Pulmonary Vent And Perf (v/q Scan)  Result Date: 05/31/2017 CLINICAL DATA:  Suspected PE. Positive D-dimer. EXAM: NUCLEAR MEDICINE VENTILATION - PERFUSION LUNG SCAN TECHNIQUE: Ventilation images were obtained in multiple projections using inhaled aerosol Tc-78m DTPA. Perfusion images were obtained in multiple projections after intravenous injection of Tc-34m-MAA. RADIOPHARMACEUTICALS:  31.7 mCi of Tc-55m DTPA aerosol inhalation and 4.5 mCi Tc21m-MAA IV COMPARISON:  Chest x-ray 2019.  FINDINGS: Ventilation: No focal ventilation defect. Perfusion: No wedge shaped peripheral perfusion defects to suggest acute pulmonary embolism. IMPRESSION: Negative exam.  No evidence of pulmonary embolus. Electronically Signed   By: Marcello Moores  Register   On: 05/31/2017 14:15    Microbiology: No results found for this or any previous visit (from the past 240 hour(s)).   Labs: CBC: Recent Labs  Lab 05/30/17 2327 06/01/17 0313 06/02/17 0627 06/03/17 0734  WBC 6.1 6.8 5.7 5.7  HGB 11.0* 11.2* 11.1* 10.8*  HCT 33.1* 34.2* 34.0* 33.2*  MCV 94.3 92.9 93.4 94.1  PLT 130* 135* 138* 915*   Basic Metabolic Panel: Recent Labs  Lab 05/30/17 2327 06/01/17 0313 06/02/17 0627 06/03/17 0734  NA 139 139 141 139  K 3.6 3.2* 3.5 3.8  CL 106 104 108 108  CO2 23 25 26 24   GLUCOSE 190* 133* 124* 142*  BUN 23* 29* 30* 28*  CREATININE 1.83* 1.68* 1.87* 1.59*  CALCIUM 8.8* 8.8* 8.7* 8.8*  MG  --   --  1.8  --    Liver Function Tests: No results for input(s): AST, ALT, ALKPHOS, BILITOT, PROT, ALBUMIN in the last 168 hours. No results for input(s): LIPASE, AMYLASE in the last 168 hours. No results for input(s): AMMONIA in the last 168 hours. Cardiac Enzymes: Recent Labs  Lab 05/31/17 1559 05/31/17 2120 06/01/17 0313  TROPONINI 0.08* 0.07* 0.08*   BNP (last 3 results) Recent Labs    05/31/17 0735  BNP 1,260.9*   CBG: Recent Labs  Lab 06/02/17 1152 06/02/17 1628 06/02/17 2126 06/03/17 0717 06/03/17 1123  GLUCAP 213* 130* 152* 139* 267*   Time spent: 35 minutes  Signed:  Berle Mull  [facility] 06/03/2017, 6:22 PM

## 2017-06-06 NOTE — Patient Outreach (Signed)
Request received from Brooke Joyce, LCSW to mail patient personal care resources.  Information mailed today. 

## 2017-06-07 ENCOUNTER — Other Ambulatory Visit: Payer: Self-pay

## 2017-06-07 NOTE — Patient Outreach (Signed)
[facility]) Care Management  06/07/2017  Yvonne Patel 08-23-1945 [id number]   Telephone call placed to the patient for initial assessment.  HIPAA verified. Discussed and offered [facility] care management services with patient. Patient verbally agreed to services. She stated that she was unable to do the assessment today.    Plan:  RN Health Coach will contact the patient in one business day.  Lazaro Arms RN, BSN, [place] Direct Dial:  [phone]  Fax: [phone]

## 2017-06-08 ENCOUNTER — Other Ambulatory Visit: Payer: Self-pay

## 2017-06-08 NOTE — Patient Outreach (Signed)
[facility]) Care Management  06/08/2017  YARISA LYNAM 1945/06/27 [id number]   2nd telephone call placed to the patient for initial assessment. No answer. Unable to leave a message.  Plan: RN Health Coach will send letter for outreach attempt. RN Health Coach will make 3rd telephone outreach attempt in ten business days. No answer I will proceed with case closure.    Lazaro Arms RN, BSN, [place] Direct Dial:  [phone]  Fax: [phone]

## 2017-06-15 DIAGNOSIS — H3582 Retinal ischemia: Secondary | ICD-10-CM | POA: Diagnosis not present

## 2017-06-15 DIAGNOSIS — H35042 Retinal micro-aneurysms, unspecified, left eye: Secondary | ICD-10-CM | POA: Diagnosis not present

## 2017-06-15 DIAGNOSIS — H43813 Vitreous degeneration, bilateral: Secondary | ICD-10-CM | POA: Diagnosis not present

## 2017-06-15 DIAGNOSIS — E113513 Type 2 diabetes mellitus with proliferative diabetic retinopathy with macular edema, bilateral: Secondary | ICD-10-CM | POA: Diagnosis not present

## 2017-06-16 ENCOUNTER — Other Ambulatory Visit: Payer: Self-pay | Admitting: Licensed Clinical Social Worker

## 2017-06-16 NOTE — Patient Outreach (Signed)
[facility]) Care Management  06/16/2017  Yvonne Patel [birth year] [id number]  [facility] CSW completed outreach call to patient to follow up on community resources that were mailed to her on 06/03/17. Patient answered phone call and provided HIPPA verifications. Patient reports that she has not received documents in the mail yet and wishes for [facility] CSW to email her these resources just to ensure that she receives them successfully. [facility] CSW sent secure email with requested resources to patient on 06/16/17. [facility] CSW completed review of resources ([place], Product/process development scientist and [employer]. Patient appreciative of resource review and denies any further social work needs at this time. [facility] CSW will sign off at this time.  Yvonne Patel, BSW, MSW, [email or web address] Phone: [phone] Fax: [phone]

## 2017-06-20 ENCOUNTER — Other Ambulatory Visit: Payer: Self-pay

## 2017-06-20 DIAGNOSIS — I10 Essential (primary) hypertension: Secondary | ICD-10-CM | POA: Diagnosis not present

## 2017-06-20 DIAGNOSIS — I25119 Atherosclerotic heart disease of native coronary artery with unspecified angina pectoris: Secondary | ICD-10-CM | POA: Diagnosis not present

## 2017-06-20 DIAGNOSIS — I5033 Acute on chronic diastolic (congestive) heart failure: Secondary | ICD-10-CM | POA: Diagnosis not present

## 2017-06-20 NOTE — Telephone Encounter (Signed)
This encounter was created in error - please disregard.

## 2017-06-20 NOTE — Patient Outreach (Signed)
[facility]) Care Management  06/20/2017  Yvonne Patel [birth year] [id number]   3rd attempt to outreach the for initial assessment. No answer. Unable to leave a message.  Plan:  RN Health Coach will close the case due to inability to contact the patient.  Lazaro Arms RN, BSN, [place] Direct Dial:  [phone]  Fax: [phone]

## 2017-06-29 DIAGNOSIS — E113513 Type 2 diabetes mellitus with proliferative diabetic retinopathy with macular edema, bilateral: Secondary | ICD-10-CM | POA: Diagnosis not present

## 2017-06-29 DIAGNOSIS — E113512 Type 2 diabetes mellitus with proliferative diabetic retinopathy with macular edema, left eye: Secondary | ICD-10-CM | POA: Diagnosis not present

## 2017-07-04 DIAGNOSIS — Z7984 Long term (current) use of oral hypoglycemic drugs: Secondary | ICD-10-CM | POA: Diagnosis not present

## 2017-07-04 DIAGNOSIS — H409 Unspecified glaucoma: Secondary | ICD-10-CM | POA: Diagnosis not present

## 2017-07-04 DIAGNOSIS — I5042 Chronic combined systolic (congestive) and diastolic (congestive) heart failure: Secondary | ICD-10-CM | POA: Diagnosis not present

## 2017-07-04 DIAGNOSIS — N184 Chronic kidney disease, stage 4 (severe): Secondary | ICD-10-CM | POA: Diagnosis not present

## 2017-07-04 DIAGNOSIS — I129 Hypertensive chronic kidney disease with stage 1 through stage 4 chronic kidney disease, or unspecified chronic kidney disease: Secondary | ICD-10-CM | POA: Diagnosis not present

## 2017-07-04 DIAGNOSIS — E1165 Type 2 diabetes mellitus with hyperglycemia: Secondary | ICD-10-CM | POA: Diagnosis not present

## 2017-07-04 DIAGNOSIS — Z7689 Persons encountering health services in other specified circumstances: Secondary | ICD-10-CM | POA: Diagnosis not present

## 2017-07-04 DIAGNOSIS — E785 Hyperlipidemia, unspecified: Secondary | ICD-10-CM | POA: Diagnosis not present

## 2017-07-04 DIAGNOSIS — I739 Peripheral vascular disease, unspecified: Secondary | ICD-10-CM | POA: Diagnosis not present

## 2017-07-04 DIAGNOSIS — E113593 Type 2 diabetes mellitus with proliferative diabetic retinopathy without macular edema, bilateral: Secondary | ICD-10-CM | POA: Diagnosis not present

## 2017-07-12 DIAGNOSIS — I2510 Atherosclerotic heart disease of native coronary artery without angina pectoris: Secondary | ICD-10-CM | POA: Diagnosis not present

## 2017-07-12 DIAGNOSIS — I5030 Unspecified diastolic (congestive) heart failure: Secondary | ICD-10-CM | POA: Diagnosis not present

## 2017-07-12 DIAGNOSIS — I10 Essential (primary) hypertension: Secondary | ICD-10-CM | POA: Diagnosis not present

## 2017-07-29 DIAGNOSIS — H3582 Retinal ischemia: Secondary | ICD-10-CM | POA: Diagnosis not present

## 2017-07-29 DIAGNOSIS — H35042 Retinal micro-aneurysms, unspecified, left eye: Secondary | ICD-10-CM | POA: Diagnosis not present

## 2017-07-29 DIAGNOSIS — H43813 Vitreous degeneration, bilateral: Secondary | ICD-10-CM | POA: Diagnosis not present

## 2017-07-29 DIAGNOSIS — E113513 Type 2 diabetes mellitus with proliferative diabetic retinopathy with macular edema, bilateral: Secondary | ICD-10-CM | POA: Diagnosis not present

## 2017-08-03 DIAGNOSIS — E113511 Type 2 diabetes mellitus with proliferative diabetic retinopathy with macular edema, right eye: Secondary | ICD-10-CM | POA: Diagnosis not present

## 2017-08-26 DIAGNOSIS — E113513 Type 2 diabetes mellitus with proliferative diabetic retinopathy with macular edema, bilateral: Secondary | ICD-10-CM | POA: Diagnosis not present

## 2017-08-26 DIAGNOSIS — H35042 Retinal micro-aneurysms, unspecified, left eye: Secondary | ICD-10-CM | POA: Diagnosis not present

## 2017-08-26 DIAGNOSIS — H3582 Retinal ischemia: Secondary | ICD-10-CM | POA: Diagnosis not present

## 2017-08-26 DIAGNOSIS — H43813 Vitreous degeneration, bilateral: Secondary | ICD-10-CM | POA: Diagnosis not present

## 2017-09-05 DIAGNOSIS — I129 Hypertensive chronic kidney disease with stage 1 through stage 4 chronic kidney disease, or unspecified chronic kidney disease: Secondary | ICD-10-CM | POA: Diagnosis not present

## 2017-09-05 DIAGNOSIS — Z7984 Long term (current) use of oral hypoglycemic drugs: Secondary | ICD-10-CM | POA: Diagnosis not present

## 2017-09-05 DIAGNOSIS — E1165 Type 2 diabetes mellitus with hyperglycemia: Secondary | ICD-10-CM | POA: Diagnosis not present

## 2017-09-05 DIAGNOSIS — H409 Unspecified glaucoma: Secondary | ICD-10-CM | POA: Diagnosis not present

## 2017-09-05 DIAGNOSIS — E113593 Type 2 diabetes mellitus with proliferative diabetic retinopathy without macular edema, bilateral: Secondary | ICD-10-CM | POA: Diagnosis not present

## 2017-09-05 DIAGNOSIS — I739 Peripheral vascular disease, unspecified: Secondary | ICD-10-CM | POA: Diagnosis not present

## 2017-09-05 DIAGNOSIS — E785 Hyperlipidemia, unspecified: Secondary | ICD-10-CM | POA: Diagnosis not present

## 2017-09-05 DIAGNOSIS — N184 Chronic kidney disease, stage 4 (severe): Secondary | ICD-10-CM | POA: Diagnosis not present

## 2017-09-05 DIAGNOSIS — I5042 Chronic combined systolic (congestive) and diastolic (congestive) heart failure: Secondary | ICD-10-CM | POA: Diagnosis not present

## 2017-09-06 DIAGNOSIS — I129 Hypertensive chronic kidney disease with stage 1 through stage 4 chronic kidney disease, or unspecified chronic kidney disease: Secondary | ICD-10-CM | POA: Diagnosis not present

## 2017-09-06 DIAGNOSIS — N184 Chronic kidney disease, stage 4 (severe): Secondary | ICD-10-CM | POA: Diagnosis not present

## 2017-09-06 DIAGNOSIS — E113593 Type 2 diabetes mellitus with proliferative diabetic retinopathy without macular edema, bilateral: Secondary | ICD-10-CM | POA: Diagnosis not present

## 2017-09-06 DIAGNOSIS — E1165 Type 2 diabetes mellitus with hyperglycemia: Secondary | ICD-10-CM | POA: Diagnosis not present

## 2017-09-06 DIAGNOSIS — H409 Unspecified glaucoma: Secondary | ICD-10-CM | POA: Diagnosis not present

## 2017-09-06 DIAGNOSIS — I5042 Chronic combined systolic (congestive) and diastolic (congestive) heart failure: Secondary | ICD-10-CM | POA: Diagnosis not present

## 2017-09-06 DIAGNOSIS — I739 Peripheral vascular disease, unspecified: Secondary | ICD-10-CM | POA: Diagnosis not present

## 2017-09-06 DIAGNOSIS — E785 Hyperlipidemia, unspecified: Secondary | ICD-10-CM | POA: Diagnosis not present

## 2017-09-13 DIAGNOSIS — I6523 Occlusion and stenosis of bilateral carotid arteries: Secondary | ICD-10-CM | POA: Diagnosis not present

## 2017-09-14 DIAGNOSIS — I2510 Atherosclerotic heart disease of native coronary artery without angina pectoris: Secondary | ICD-10-CM | POA: Diagnosis not present

## 2017-09-14 DIAGNOSIS — E782 Mixed hyperlipidemia: Secondary | ICD-10-CM | POA: Diagnosis not present

## 2017-09-14 DIAGNOSIS — I5030 Unspecified diastolic (congestive) heart failure: Secondary | ICD-10-CM | POA: Diagnosis not present

## 2017-09-14 DIAGNOSIS — I10 Essential (primary) hypertension: Secondary | ICD-10-CM | POA: Diagnosis not present

## 2017-11-15 DIAGNOSIS — H43813 Vitreous degeneration, bilateral: Secondary | ICD-10-CM | POA: Diagnosis not present

## 2017-11-15 DIAGNOSIS — H35042 Retinal micro-aneurysms, unspecified, left eye: Secondary | ICD-10-CM | POA: Diagnosis not present

## 2017-11-15 DIAGNOSIS — H3582 Retinal ischemia: Secondary | ICD-10-CM | POA: Diagnosis not present

## 2017-11-15 DIAGNOSIS — E113513 Type 2 diabetes mellitus with proliferative diabetic retinopathy with macular edema, bilateral: Secondary | ICD-10-CM | POA: Diagnosis not present

## 2017-12-06 DIAGNOSIS — E113512 Type 2 diabetes mellitus with proliferative diabetic retinopathy with macular edema, left eye: Secondary | ICD-10-CM | POA: Diagnosis not present

## 2017-12-27 DIAGNOSIS — E113511 Type 2 diabetes mellitus with proliferative diabetic retinopathy with macular edema, right eye: Secondary | ICD-10-CM | POA: Diagnosis not present

## 2018-01-13 DIAGNOSIS — E1165 Type 2 diabetes mellitus with hyperglycemia: Secondary | ICD-10-CM | POA: Diagnosis not present

## 2018-01-13 DIAGNOSIS — E785 Hyperlipidemia, unspecified: Secondary | ICD-10-CM | POA: Diagnosis not present

## 2018-01-13 DIAGNOSIS — H409 Unspecified glaucoma: Secondary | ICD-10-CM | POA: Diagnosis not present

## 2018-01-13 DIAGNOSIS — N184 Chronic kidney disease, stage 4 (severe): Secondary | ICD-10-CM | POA: Diagnosis not present

## 2018-01-13 DIAGNOSIS — I739 Peripheral vascular disease, unspecified: Secondary | ICD-10-CM | POA: Diagnosis not present

## 2018-01-13 DIAGNOSIS — I129 Hypertensive chronic kidney disease with stage 1 through stage 4 chronic kidney disease, or unspecified chronic kidney disease: Secondary | ICD-10-CM | POA: Diagnosis not present

## 2018-01-13 DIAGNOSIS — I5042 Chronic combined systolic (congestive) and diastolic (congestive) heart failure: Secondary | ICD-10-CM | POA: Diagnosis not present

## 2018-01-13 DIAGNOSIS — Z1211 Encounter for screening for malignant neoplasm of colon: Secondary | ICD-10-CM | POA: Diagnosis not present

## 2018-01-13 DIAGNOSIS — E113593 Type 2 diabetes mellitus with proliferative diabetic retinopathy without macular edema, bilateral: Secondary | ICD-10-CM | POA: Diagnosis not present

## 2018-01-15 DIAGNOSIS — Z1211 Encounter for screening for malignant neoplasm of colon: Secondary | ICD-10-CM | POA: Diagnosis not present

## 2018-01-16 DIAGNOSIS — H409 Unspecified glaucoma: Secondary | ICD-10-CM | POA: Diagnosis not present

## 2018-01-16 DIAGNOSIS — I739 Peripheral vascular disease, unspecified: Secondary | ICD-10-CM | POA: Diagnosis not present

## 2018-01-16 DIAGNOSIS — I129 Hypertensive chronic kidney disease with stage 1 through stage 4 chronic kidney disease, or unspecified chronic kidney disease: Secondary | ICD-10-CM | POA: Diagnosis not present

## 2018-01-16 DIAGNOSIS — I5042 Chronic combined systolic (congestive) and diastolic (congestive) heart failure: Secondary | ICD-10-CM | POA: Diagnosis not present

## 2018-01-16 DIAGNOSIS — E785 Hyperlipidemia, unspecified: Secondary | ICD-10-CM | POA: Diagnosis not present

## 2018-01-16 DIAGNOSIS — N184 Chronic kidney disease, stage 4 (severe): Secondary | ICD-10-CM | POA: Diagnosis not present

## 2018-01-16 DIAGNOSIS — E113593 Type 2 diabetes mellitus with proliferative diabetic retinopathy without macular edema, bilateral: Secondary | ICD-10-CM | POA: Diagnosis not present

## 2018-01-16 DIAGNOSIS — E1165 Type 2 diabetes mellitus with hyperglycemia: Secondary | ICD-10-CM | POA: Diagnosis not present

## 2018-01-16 DIAGNOSIS — Z1211 Encounter for screening for malignant neoplasm of colon: Secondary | ICD-10-CM | POA: Diagnosis not present

## 2018-01-17 DIAGNOSIS — E113513 Type 2 diabetes mellitus with proliferative diabetic retinopathy with macular edema, bilateral: Secondary | ICD-10-CM | POA: Diagnosis not present

## 2018-01-17 DIAGNOSIS — H3582 Retinal ischemia: Secondary | ICD-10-CM | POA: Diagnosis not present

## 2018-01-17 DIAGNOSIS — H35042 Retinal micro-aneurysms, unspecified, left eye: Secondary | ICD-10-CM | POA: Diagnosis not present

## 2018-01-17 DIAGNOSIS — H43813 Vitreous degeneration, bilateral: Secondary | ICD-10-CM | POA: Diagnosis not present

## 2018-03-02 ENCOUNTER — Other Ambulatory Visit: Payer: Self-pay | Admitting: *Deleted

## 2018-03-02 NOTE — Patient Outreach (Signed)
[facility]) Care Management  03/02/2018  Yvonne Patel [birth year] [id number]   Telephone Screen  Referral Date: 03/02/18 Referral Source:Nurse call center Referral Reason: vomiting, on 03/01/18 q 30 minutes since 2 am, wanting to avoid dehydration per her daughter, Edwena Blow. Has urinated in 8 hrs, states took all her meds in the middle of the night because she missed her dose. Then vomited immediately. No more vomiting per Pt. Wants to know if she can retake. Takes these medications twice a day, in morning and evening,  Retake if see pills, Do not retake if can't see pills, await next schedule dose time - L taylor Rn recommended call pcp (Dr Jacelyn Grip) within 24 hours  Insurance: HTA    Outreach attempt # 1 successful to the home number  Patient is able to verify HIPAA Reviewed and addressed referral to [facility] with patient  Ms Salas confirms she is "fine now" She denies further vomiting nor need to contact her primary for follow up appointment. She reports waiting to take medication at the next scheduled dose time with success. She denies dehydration and vomiting today   Unsuccessful Attempst to engage this patient has been made previously by [facility]  telephonic and health coach. Successful interactions have been made by [facility] to assist with information for assisting her daughter to become a caregiver via DSS/CAPS Today when [facility] RN CM reviewed [facility] various services she denies any needs    Social: Ms Kutsch lives with her daughter and denies issue with ADLs, iADLs and transportation to medical appointments    Conditions: HTN, CHF, HDL, DM on insulin/pills, CKD HDL, right knee pain , iron deficiency anemia, hx of CVA, Diabetic neuropathy and macular edema, PVD    Medications: denies concerns with taking medications as prescribed, affording medications, side effects of medications and questions about medications  Advance Directives: Denies need for assist with advance directives     Consent: [facility] RN CM reviewed [facility] services with patient. Patient gave verbal consent for services.  Plan: [facility] RN CM will close case at this time as patient has been assessed and no needs identified.   [facility] RN CM sent a successful outreach letter as discussed with [facility] brochure enclosed for review  Pt encouraged to return a call to [facility] RN CM prn   Kingstyn Deruiter L. Lavina Hamman, RN, BSN, [place] Coordinator Office number [phone] Mobile number [phone]  Main [facility] number [phone] Fax number [phone]

## 2018-03-08 ENCOUNTER — Inpatient Hospital Stay (HOSPITAL_COMMUNITY): Payer: PPO

## 2018-03-08 ENCOUNTER — Inpatient Hospital Stay (HOSPITAL_COMMUNITY): Payer: PPO | Admitting: Certified Registered Nurse Anesthetist

## 2018-03-08 ENCOUNTER — Emergency Department (HOSPITAL_COMMUNITY): Payer: PPO

## 2018-03-08 ENCOUNTER — Inpatient Hospital Stay (HOSPITAL_COMMUNITY)
Admission: EM | Admit: 2018-03-08 | Discharge: 2018-04-08 | DRG: 023 | Disposition: E | Payer: PPO | Attending: Neurology | Admitting: Neurology

## 2018-03-08 ENCOUNTER — Encounter (HOSPITAL_COMMUNITY): Admission: EM | Disposition: E | Payer: Self-pay | Source: Home / Self Care | Attending: Neurology

## 2018-03-08 ENCOUNTER — Encounter (HOSPITAL_COMMUNITY): Payer: Self-pay | Admitting: Interventional Radiology

## 2018-03-08 DIAGNOSIS — R29723 NIHSS score 23: Secondary | ICD-10-CM | POA: Diagnosis present

## 2018-03-08 DIAGNOSIS — R001 Bradycardia, unspecified: Secondary | ICD-10-CM | POA: Diagnosis not present

## 2018-03-08 DIAGNOSIS — R402342 Coma scale, best motor response, flexion withdrawal, at arrival to emergency department: Secondary | ICD-10-CM | POA: Diagnosis not present

## 2018-03-08 DIAGNOSIS — R7989 Other specified abnormal findings of blood chemistry: Secondary | ICD-10-CM | POA: Diagnosis not present

## 2018-03-08 DIAGNOSIS — Z01818 Encounter for other preprocedural examination: Secondary | ICD-10-CM

## 2018-03-08 DIAGNOSIS — R945 Abnormal results of liver function studies: Secondary | ICD-10-CM | POA: Diagnosis not present

## 2018-03-08 DIAGNOSIS — Z531 Procedure and treatment not carried out because of patient's decision for reasons of belief and group pressure: Secondary | ICD-10-CM | POA: Diagnosis not present

## 2018-03-08 DIAGNOSIS — J9600 Acute respiratory failure, unspecified whether with hypoxia or hypercapnia: Secondary | ICD-10-CM | POA: Diagnosis not present

## 2018-03-08 DIAGNOSIS — R739 Hyperglycemia, unspecified: Secondary | ICD-10-CM | POA: Diagnosis not present

## 2018-03-08 DIAGNOSIS — I614 Nontraumatic intracerebral hemorrhage in cerebellum: Secondary | ICD-10-CM | POA: Diagnosis not present

## 2018-03-08 DIAGNOSIS — I255 Ischemic cardiomyopathy: Secondary | ICD-10-CM | POA: Diagnosis not present

## 2018-03-08 DIAGNOSIS — N184 Chronic kidney disease, stage 4 (severe): Secondary | ICD-10-CM | POA: Diagnosis present

## 2018-03-08 DIAGNOSIS — Z7982 Long term (current) use of aspirin: Secondary | ICD-10-CM

## 2018-03-08 DIAGNOSIS — I63412 Cerebral infarction due to embolism of left middle cerebral artery: Secondary | ICD-10-CM | POA: Diagnosis not present

## 2018-03-08 DIAGNOSIS — I5020 Unspecified systolic (congestive) heart failure: Secondary | ICD-10-CM | POA: Diagnosis not present

## 2018-03-08 DIAGNOSIS — I130 Hypertensive heart and chronic kidney disease with heart failure and stage 1 through stage 4 chronic kidney disease, or unspecified chronic kidney disease: Secondary | ICD-10-CM | POA: Diagnosis present

## 2018-03-08 DIAGNOSIS — E1140 Type 2 diabetes mellitus with diabetic neuropathy, unspecified: Secondary | ICD-10-CM | POA: Diagnosis not present

## 2018-03-08 DIAGNOSIS — E876 Hypokalemia: Secondary | ICD-10-CM | POA: Diagnosis not present

## 2018-03-08 DIAGNOSIS — I5021 Acute systolic (congestive) heart failure: Secondary | ICD-10-CM | POA: Diagnosis not present

## 2018-03-08 DIAGNOSIS — I2510 Atherosclerotic heart disease of native coronary artery without angina pectoris: Secondary | ICD-10-CM | POA: Diagnosis not present

## 2018-03-08 DIAGNOSIS — I708 Atherosclerosis of other arteries: Secondary | ICD-10-CM | POA: Diagnosis not present

## 2018-03-08 DIAGNOSIS — I6389 Other cerebral infarction: Secondary | ICD-10-CM

## 2018-03-08 DIAGNOSIS — I129 Hypertensive chronic kidney disease with stage 1 through stage 4 chronic kidney disease, or unspecified chronic kidney disease: Secondary | ICD-10-CM | POA: Diagnosis not present

## 2018-03-08 DIAGNOSIS — I745 Embolism and thrombosis of iliac artery: Secondary | ICD-10-CM | POA: Diagnosis not present

## 2018-03-08 DIAGNOSIS — I429 Cardiomyopathy, unspecified: Secondary | ICD-10-CM | POA: Diagnosis not present

## 2018-03-08 DIAGNOSIS — E1151 Type 2 diabetes mellitus with diabetic peripheral angiopathy without gangrene: Secondary | ICD-10-CM | POA: Diagnosis present

## 2018-03-08 DIAGNOSIS — N179 Acute kidney failure, unspecified: Secondary | ICD-10-CM | POA: Diagnosis not present

## 2018-03-08 DIAGNOSIS — R402112 Coma scale, eyes open, never, at arrival to emergency department: Secondary | ICD-10-CM | POA: Diagnosis not present

## 2018-03-08 DIAGNOSIS — I739 Peripheral vascular disease, unspecified: Secondary | ICD-10-CM

## 2018-03-08 DIAGNOSIS — Z794 Long term (current) use of insulin: Secondary | ICD-10-CM

## 2018-03-08 DIAGNOSIS — E1110 Type 2 diabetes mellitus with ketoacidosis without coma: Secondary | ICD-10-CM | POA: Diagnosis not present

## 2018-03-08 DIAGNOSIS — I5023 Acute on chronic systolic (congestive) heart failure: Secondary | ICD-10-CM | POA: Diagnosis not present

## 2018-03-08 DIAGNOSIS — I6300 Cerebral infarction due to thrombosis of unspecified precerebral artery: Secondary | ICD-10-CM | POA: Diagnosis not present

## 2018-03-08 DIAGNOSIS — Z978 Presence of other specified devices: Secondary | ICD-10-CM | POA: Diagnosis not present

## 2018-03-08 DIAGNOSIS — E785 Hyperlipidemia, unspecified: Secondary | ICD-10-CM | POA: Diagnosis present

## 2018-03-08 DIAGNOSIS — J90 Pleural effusion, not elsewhere classified: Secondary | ICD-10-CM | POA: Diagnosis not present

## 2018-03-08 DIAGNOSIS — I639 Cerebral infarction, unspecified: Secondary | ICD-10-CM

## 2018-03-08 DIAGNOSIS — Z4659 Encounter for fitting and adjustment of other gastrointestinal appliance and device: Secondary | ICD-10-CM

## 2018-03-08 DIAGNOSIS — E872 Acidosis, unspecified: Secondary | ICD-10-CM | POA: Diagnosis not present

## 2018-03-08 DIAGNOSIS — Z4682 Encounter for fitting and adjustment of non-vascular catheter: Secondary | ICD-10-CM | POA: Diagnosis not present

## 2018-03-08 DIAGNOSIS — I69320 Aphasia following cerebral infarction: Secondary | ICD-10-CM

## 2018-03-08 DIAGNOSIS — N183 Chronic kidney disease, stage 3 unspecified: Secondary | ICD-10-CM | POA: Diagnosis present

## 2018-03-08 DIAGNOSIS — Z9911 Dependence on respirator [ventilator] status: Secondary | ICD-10-CM | POA: Diagnosis not present

## 2018-03-08 DIAGNOSIS — R29810 Facial weakness: Secondary | ICD-10-CM | POA: Diagnosis present

## 2018-03-08 DIAGNOSIS — I63512 Cerebral infarction due to unspecified occlusion or stenosis of left middle cerebral artery: Secondary | ICD-10-CM | POA: Diagnosis not present

## 2018-03-08 DIAGNOSIS — R402232 Coma scale, best verbal response, inappropriate words, at arrival to emergency department: Secondary | ICD-10-CM | POA: Diagnosis present

## 2018-03-08 DIAGNOSIS — Z452 Encounter for adjustment and management of vascular access device: Secondary | ICD-10-CM | POA: Diagnosis not present

## 2018-03-08 DIAGNOSIS — W19XXXA Unspecified fall, initial encounter: Secondary | ICD-10-CM | POA: Diagnosis not present

## 2018-03-08 DIAGNOSIS — J9690 Respiratory failure, unspecified, unspecified whether with hypoxia or hypercapnia: Secondary | ICD-10-CM | POA: Diagnosis not present

## 2018-03-08 DIAGNOSIS — J9601 Acute respiratory failure with hypoxia: Secondary | ICD-10-CM | POA: Diagnosis not present

## 2018-03-08 DIAGNOSIS — R918 Other nonspecific abnormal finding of lung field: Secondary | ICD-10-CM | POA: Diagnosis not present

## 2018-03-08 DIAGNOSIS — I6523 Occlusion and stenosis of bilateral carotid arteries: Secondary | ICD-10-CM | POA: Diagnosis not present

## 2018-03-08 DIAGNOSIS — Z66 Do not resuscitate: Secondary | ICD-10-CM | POA: Diagnosis not present

## 2018-03-08 DIAGNOSIS — Z7902 Long term (current) use of antithrombotics/antiplatelets: Secondary | ICD-10-CM

## 2018-03-08 DIAGNOSIS — G8191 Hemiplegia, unspecified affecting right dominant side: Secondary | ICD-10-CM | POA: Diagnosis present

## 2018-03-08 DIAGNOSIS — Z87891 Personal history of nicotine dependence: Secondary | ICD-10-CM

## 2018-03-08 DIAGNOSIS — I214 Non-ST elevation (NSTEMI) myocardial infarction: Secondary | ICD-10-CM | POA: Diagnosis present

## 2018-03-08 DIAGNOSIS — R531 Weakness: Secondary | ICD-10-CM | POA: Diagnosis not present

## 2018-03-08 DIAGNOSIS — I63312 Cerebral infarction due to thrombosis of left middle cerebral artery: Secondary | ICD-10-CM | POA: Diagnosis not present

## 2018-03-08 DIAGNOSIS — Z79899 Other long term (current) drug therapy: Secondary | ICD-10-CM

## 2018-03-08 DIAGNOSIS — E875 Hyperkalemia: Secondary | ICD-10-CM | POA: Diagnosis not present

## 2018-03-08 DIAGNOSIS — R29818 Other symptoms and signs involving the nervous system: Secondary | ICD-10-CM | POA: Diagnosis not present

## 2018-03-08 DIAGNOSIS — I10 Essential (primary) hypertension: Secondary | ICD-10-CM | POA: Diagnosis not present

## 2018-03-08 DIAGNOSIS — R233 Spontaneous ecchymoses: Secondary | ICD-10-CM | POA: Diagnosis not present

## 2018-03-08 DIAGNOSIS — I6602 Occlusion and stenosis of left middle cerebral artery: Secondary | ICD-10-CM | POA: Diagnosis not present

## 2018-03-08 DIAGNOSIS — E1122 Type 2 diabetes mellitus with diabetic chronic kidney disease: Secondary | ICD-10-CM | POA: Diagnosis not present

## 2018-03-08 DIAGNOSIS — R740 Nonspecific elevation of levels of transaminase and lactic acid dehydrogenase [LDH]: Secondary | ICD-10-CM | POA: Diagnosis not present

## 2018-03-08 DIAGNOSIS — R404 Transient alteration of awareness: Secondary | ICD-10-CM | POA: Diagnosis not present

## 2018-03-08 DIAGNOSIS — I700 Atherosclerosis of aorta: Secondary | ICD-10-CM | POA: Diagnosis not present

## 2018-03-08 DIAGNOSIS — I959 Hypotension, unspecified: Secondary | ICD-10-CM | POA: Diagnosis present

## 2018-03-08 DIAGNOSIS — I4891 Unspecified atrial fibrillation: Secondary | ICD-10-CM | POA: Diagnosis not present

## 2018-03-08 HISTORY — PX: IR ANGIOGRAM EXTREMITY BILATERAL: IMG653

## 2018-03-08 HISTORY — PX: IR US GUIDE VASC ACCESS RIGHT: IMG2390

## 2018-03-08 HISTORY — PX: RADIOLOGY WITH ANESTHESIA: SHX6223

## 2018-03-08 HISTORY — PX: IR PERCUTANEOUS ART THROMBECTOMY/INFUSION INTRACRANIAL INC DIAG ANGIO: IMG6087

## 2018-03-08 LAB — COMPREHENSIVE METABOLIC PANEL
ALK PHOS: 97 U/L (ref 38–126)
ALT: 26 U/L (ref 0–44)
AST: 27 U/L (ref 15–41)
Albumin: 3 g/dL — ABNORMAL LOW (ref 3.5–5.0)
Anion gap: 14 (ref 5–15)
BUN: 32 mg/dL — ABNORMAL HIGH (ref 8–23)
CHLORIDE: 101 mmol/L (ref 98–111)
CO2: 25 mmol/L (ref 22–32)
CREATININE: 2.25 mg/dL — AB (ref 0.44–1.00)
Calcium: 9.3 mg/dL (ref 8.9–10.3)
GFR calc non Af Amer: 21 mL/min — ABNORMAL LOW (ref >60)
GFR, EST AFRICAN AMERICAN: 24 mL/min — AB (ref >60)
Glucose, Bld: 191 mg/dL — ABNORMAL HIGH (ref 70–99)
Potassium: 3.3 mmol/L — ABNORMAL LOW (ref 3.5–5.1)
Sodium: 140 mmol/L (ref 135–145)
Total Bilirubin: 0.7 mg/dL (ref 0.3–1.2)
Total Protein: 7.4 g/dL (ref 6.5–8.1)

## 2018-03-08 LAB — DIFFERENTIAL
ABS IMMATURE GRANULOCYTES: 0.01 10*3/uL (ref 0.00–0.07)
BASOS ABS: 0.1 10*3/uL (ref 0.0–0.1)
Basophils Relative: 1 %
Eosinophils Absolute: 0.1 10*3/uL (ref 0.0–0.5)
Eosinophils Relative: 2 %
Immature Granulocytes: 0 %
LYMPHS PCT: 38 %
Lymphs Abs: 2.4 10*3/uL (ref 0.7–4.0)
MONO ABS: 0.5 10*3/uL (ref 0.1–1.0)
Monocytes Relative: 9 %
NEUTROS ABS: 3.2 10*3/uL (ref 1.7–7.7)
NEUTROS PCT: 50 %

## 2018-03-08 LAB — POCT I-STAT 3, ART BLOOD GAS (G3+)
Acid-Base Excess: 5 mmol/L — ABNORMAL HIGH (ref 0.0–2.0)
Bicarbonate: 28.6 mmol/L — ABNORMAL HIGH (ref 20.0–28.0)
O2 Saturation: 100 %
PCO2 ART: 37.5 mmHg (ref 32.0–48.0)
PO2 ART: 451 mmHg — AB (ref 83.0–108.0)
Patient temperature: 96.5
TCO2: 30 mmol/L (ref 22–32)
pH, Arterial: 7.486 — ABNORMAL HIGH (ref 7.350–7.450)

## 2018-03-08 LAB — CBC
HEMATOCRIT: 38.5 % (ref 36.0–46.0)
HEMOGLOBIN: 12.2 g/dL (ref 12.0–15.0)
MCH: 29.8 pg (ref 26.0–34.0)
MCHC: 31.7 g/dL (ref 30.0–36.0)
MCV: 94.1 fL (ref 80.0–100.0)
Platelets: 174 10*3/uL (ref 150–400)
RBC: 4.09 MIL/uL (ref 3.87–5.11)
RDW: 12.5 % (ref 11.5–15.5)
WBC: 6.3 10*3/uL (ref 4.0–10.5)
nRBC: 0 % (ref 0.0–0.2)

## 2018-03-08 LAB — I-STAT CHEM 8, ED
BUN: 32 mg/dL — ABNORMAL HIGH (ref 8–23)
CALCIUM ION: 1.01 mmol/L — AB (ref 1.15–1.40)
Chloride: 102 mmol/L (ref 98–111)
Creatinine, Ser: 2.2 mg/dL — ABNORMAL HIGH (ref 0.44–1.00)
GLUCOSE: 180 mg/dL — AB (ref 70–99)
HCT: 41 % (ref 36.0–46.0)
HEMOGLOBIN: 13.9 g/dL (ref 12.0–15.0)
POTASSIUM: 3.8 mmol/L (ref 3.5–5.1)
Sodium: 138 mmol/L (ref 135–145)
TCO2: 29 mmol/L (ref 22–32)

## 2018-03-08 LAB — CBG MONITORING, ED: Glucose-Capillary: 175 mg/dL — ABNORMAL HIGH (ref 70–99)

## 2018-03-08 LAB — APTT: APTT: 20 s — AB (ref 24–36)

## 2018-03-08 LAB — TRIGLYCERIDES: Triglycerides: 195 mg/dL — ABNORMAL HIGH (ref <150)

## 2018-03-08 LAB — I-STAT TROPONIN, ED: Troponin i, poc: 5.71 ng/mL (ref 0.00–0.08)

## 2018-03-08 LAB — PROTIME-INR
INR: 1
Prothrombin Time: 13.1 seconds (ref 11.4–15.2)

## 2018-03-08 LAB — ECHOCARDIOGRAM COMPLETE: Weight: 2740.76 oz

## 2018-03-08 LAB — MRSA PCR SCREENING: MRSA by PCR: NEGATIVE

## 2018-03-08 LAB — TROPONIN I: Troponin I: 4.62 ng/mL (ref <0.03)

## 2018-03-08 SURGERY — IR WITH ANESTHESIA
Anesthesia: General

## 2018-03-08 MED ORDER — FENTANYL CITRATE (PF) 100 MCG/2ML IJ SOLN
INTRAMUSCULAR | Status: AC
  Filled 2018-03-08: qty 2

## 2018-03-08 MED ORDER — NITROGLYCERIN 1 MG/10 ML FOR IR/CATH LAB
INTRA_ARTERIAL | Status: AC
  Filled 2018-03-08: qty 10

## 2018-03-08 MED ORDER — NITROGLYCERIN IN D5W 200-5 MCG/ML-% IV SOLN
0.0000 ug/min | INTRAVENOUS | Status: DC
  Administered 2018-03-08: 5 ug/min via INTRAVENOUS
  Filled 2018-03-08: qty 250

## 2018-03-08 MED ORDER — EPTIFIBATIDE 20 MG/10ML IV SOLN
INTRAVENOUS | Status: AC
  Filled 2018-03-08: qty 10

## 2018-03-08 MED ORDER — PROPOFOL 500 MG/50ML IV EMUL
INTRAVENOUS | Status: DC | PRN
  Administered 2018-03-08: 50 ug/kg/min via INTRAVENOUS

## 2018-03-08 MED ORDER — STROKE: EARLY STAGES OF RECOVERY BOOK
Freq: Once | Status: DC
  Filled 2018-03-08: qty 1

## 2018-03-08 MED ORDER — NICARDIPINE HCL IN NACL 20-0.86 MG/200ML-% IV SOLN
INTRAVENOUS | Status: AC
  Filled 2018-03-08: qty 200

## 2018-03-08 MED ORDER — LIDOCAINE 2% (20 MG/ML) 5 ML SYRINGE
INTRAMUSCULAR | Status: DC | PRN
  Administered 2018-03-08: 100 mg via INTRAVENOUS

## 2018-03-08 MED ORDER — ACETAMINOPHEN 325 MG PO TABS: 650.0000 mg | ORAL_TABLET | ORAL | Status: DC | PRN

## 2018-03-08 MED ORDER — NOREPINEPHRINE BITARTRATE 1 MG/ML IV SOLN
0.0000 ug/min | INTRAVENOUS | Status: DC
  Filled 2018-03-08: qty 4

## 2018-03-08 MED ORDER — ROCURONIUM BROMIDE 10 MG/ML (PF) SYRINGE
PREFILLED_SYRINGE | INTRAVENOUS | Status: DC | PRN
  Administered 2018-03-08 (×2): 50 mg via INTRAVENOUS

## 2018-03-08 MED ORDER — CHLORHEXIDINE GLUCONATE 0.12% ORAL RINSE (MEDLINE KIT)
15.0000 mL | Freq: Two times a day (BID) | OROMUCOSAL | Status: DC
  Administered 2018-03-08 – 2018-03-17 (×18): 15 mL via OROMUCOSAL

## 2018-03-08 MED ORDER — IOHEXOL 300 MG/ML  SOLN: 96.0000 mL | Freq: Once | INTRAMUSCULAR | Status: DC | PRN

## 2018-03-08 MED ORDER — ALTEPLASE (STROKE) FULL DOSE INFUSION
0.9000 mg/kg | Freq: Once | INTRAVENOUS | Status: AC
  Administered 2018-03-08: 69.9 mg via INTRAVENOUS
  Filled 2018-03-08: qty 100

## 2018-03-08 MED ORDER — EPHEDRINE SULFATE-NACL 50-0.9 MG/10ML-% IV SOSY
PREFILLED_SYRINGE | INTRAVENOUS | Status: DC | PRN
  Administered 2018-03-08 (×2): 15 mg via INTRAVENOUS

## 2018-03-08 MED ORDER — PROPOFOL 10 MG/ML IV BOLUS
INTRAVENOUS | Status: DC | PRN
  Administered 2018-03-08: 100 mg via INTRAVENOUS

## 2018-03-08 MED ORDER — POTASSIUM CHLORIDE 20 MEQ/15ML (10%) PO SOLN
40.0000 meq | Freq: Once | ORAL | Status: AC
  Administered 2018-03-08: 40 meq
  Filled 2018-03-08: qty 30

## 2018-03-08 MED ORDER — TIROFIBAN HCL IN NACL 5-0.9 MG/100ML-% IV SOLN
INTRAVENOUS | Status: AC
  Filled 2018-03-08: qty 100

## 2018-03-08 MED ORDER — LIDOCAINE HCL 1 % IJ SOLN
INTRAMUSCULAR | Status: AC
  Filled 2018-03-08: qty 20

## 2018-03-08 MED ORDER — ACETAMINOPHEN 160 MG/5ML PO SOLN
650.0000 mg | ORAL | Status: DC | PRN
  Filled 2018-03-08: qty 20.3

## 2018-03-08 MED ORDER — EPHEDRINE SULFATE 50 MG/ML IJ SOLN: INTRAMUSCULAR | Status: DC | PRN

## 2018-03-08 MED ORDER — SODIUM CHLORIDE 0.9 % IV SOLN
INTRAVENOUS | Status: DC
  Administered 2018-03-08 – 2018-03-10 (×4): via INTRAVENOUS

## 2018-03-08 MED ORDER — NITROGLYCERIN 0.2 MG/ML ON CALL CATH LAB
INTRAVENOUS | Status: DC | PRN
  Administered 2018-03-08: 80 ug via INTRAVENOUS
  Administered 2018-03-08 (×3): 40 ug via INTRAVENOUS

## 2018-03-08 MED ORDER — ACETAMINOPHEN 650 MG RE SUPP: 650.0000 mg | RECTAL | Status: DC | PRN

## 2018-03-08 MED ORDER — NITROGLYCERIN IN D5W 200-5 MCG/ML-% IV SOLN
INTRAVENOUS | Status: DC | PRN
  Administered 2018-03-08: 20 ug/min via INTRAVENOUS

## 2018-03-08 MED ORDER — ATORVASTATIN CALCIUM 80 MG PO TABS
80.0000 mg | ORAL_TABLET | Freq: Every day | ORAL | Status: DC
  Administered 2018-03-09 – 2018-03-13 (×5): 80 mg via ORAL
  Filled 2018-03-08 (×5): qty 1

## 2018-03-08 MED ORDER — PHENYLEPHRINE HCL-NACL 10-0.9 MG/250ML-% IV SOLN: 0.0000 ug/min | INTRAVENOUS | Status: DC

## 2018-03-08 MED ORDER — SENNOSIDES-DOCUSATE SODIUM 8.6-50 MG PO TABS: 1.0000 | ORAL_TABLET | Freq: Every evening | ORAL | Status: DC | PRN

## 2018-03-08 MED ORDER — PANTOPRAZOLE SODIUM 40 MG IV SOLR
40.0000 mg | Freq: Every day | INTRAVENOUS | Status: DC
  Administered 2018-03-08 – 2018-03-12 (×5): 40 mg via INTRAVENOUS
  Filled 2018-03-08 (×5): qty 40

## 2018-03-08 MED ORDER — TICAGRELOR 90 MG PO TABS
ORAL_TABLET | ORAL | Status: AC
  Filled 2018-03-08: qty 2

## 2018-03-08 MED ORDER — CLOPIDOGREL BISULFATE 300 MG PO TABS
ORAL_TABLET | ORAL | Status: AC
  Filled 2018-03-08: qty 1

## 2018-03-08 MED ORDER — SUCCINYLCHOLINE CHLORIDE 200 MG/10ML IV SOSY
PREFILLED_SYRINGE | INTRAVENOUS | Status: DC | PRN
  Administered 2018-03-08: 100 mg via INTRAVENOUS

## 2018-03-08 MED ORDER — LACTATED RINGERS IV SOLN
INTRAVENOUS | Status: DC | PRN
  Administered 2018-03-08 (×2): via INTRAVENOUS

## 2018-03-08 MED ORDER — NITROGLYCERIN IN D5W 200-5 MCG/ML-% IV SOLN
INTRAVENOUS | Status: AC
  Filled 2018-03-08: qty 250

## 2018-03-08 MED ORDER — CEFAZOLIN SODIUM-DEXTROSE 2-3 GM-%(50ML) IV SOLR
INTRAVENOUS | Status: DC | PRN
  Administered 2018-03-08: 2 g via INTRAVENOUS

## 2018-03-08 MED ORDER — IOPAMIDOL (ISOVUE-370) INJECTION 76%
50.0000 mL | Freq: Once | INTRAVENOUS | Status: AC | PRN
  Administered 2018-03-08: 50 mL via INTRAVENOUS

## 2018-03-08 MED ORDER — ORAL CARE MOUTH RINSE
15.0000 mL | OROMUCOSAL | Status: DC
  Administered 2018-03-08 – 2018-03-17 (×88): 15 mL via OROMUCOSAL

## 2018-03-08 MED ORDER — CLEVIDIPINE BUTYRATE 0.5 MG/ML IV EMUL
INTRAVENOUS | Status: AC
  Filled 2018-03-08: qty 50

## 2018-03-08 MED ORDER — PROPOFOL 1000 MG/100ML IV EMUL
5.0000 ug/kg/min | INTRAVENOUS | Status: DC
  Administered 2018-03-08: 50 ug/kg/min via INTRAVENOUS
  Administered 2018-03-08: 40 ug/kg/min via INTRAVENOUS
  Administered 2018-03-09: 50 ug/kg/min via INTRAVENOUS
  Administered 2018-03-09: 5 ug/kg/min via INTRAVENOUS
  Administered 2018-03-09: 20 ug/kg/min via INTRAVENOUS
  Administered 2018-03-10: 25 ug/kg/min via INTRAVENOUS
  Filled 2018-03-08 (×6): qty 100

## 2018-03-08 MED ORDER — SODIUM CHLORIDE 0.9 % IV SOLN
INTRAVENOUS | Status: DC | PRN
  Administered 2018-03-08: 50 ug/min via INTRAVENOUS

## 2018-03-08 MED ORDER — LABETALOL HCL 5 MG/ML IV SOLN: 20.0000 mg | Freq: Once | INTRAVENOUS | Status: DC

## 2018-03-08 MED ORDER — PHENYLEPHRINE 40 MCG/ML (10ML) SYRINGE FOR IV PUSH (FOR BLOOD PRESSURE SUPPORT)
PREFILLED_SYRINGE | INTRAVENOUS | Status: DC | PRN
  Administered 2018-03-08: 80 ug via INTRAVENOUS
  Administered 2018-03-08: 160 ug via INTRAVENOUS

## 2018-03-08 MED ORDER — CEFAZOLIN SODIUM-DEXTROSE 2-4 GM/100ML-% IV SOLN
INTRAVENOUS | Status: AC
  Filled 2018-03-08: qty 100

## 2018-03-08 MED ORDER — CLEVIDIPINE BUTYRATE 0.5 MG/ML IV EMUL
0.0000 mg/h | INTRAVENOUS | Status: DC
  Administered 2018-03-08: 8 mg/h via INTRAVENOUS
  Administered 2018-03-08: 16 mg/h via INTRAVENOUS
  Administered 2018-03-09: 4 mg/h via INTRAVENOUS
  Administered 2018-03-09: 10 mg/h via INTRAVENOUS
  Administered 2018-03-10: 2 mg/h via INTRAVENOUS
  Filled 2018-03-08 (×4): qty 50

## 2018-03-08 MED ORDER — CLEVIDIPINE BUTYRATE 0.5 MG/ML IV EMUL
0.0000 mg/h | INTRAVENOUS | Status: DC
  Administered 2018-03-08: 4 mg/h via INTRAVENOUS

## 2018-03-08 MED ORDER — ASPIRIN 325 MG PO TABS
ORAL_TABLET | ORAL | Status: AC
  Filled 2018-03-08: qty 1

## 2018-03-08 MED ORDER — FENTANYL CITRATE (PF) 100 MCG/2ML IJ SOLN
INTRAMUSCULAR | Status: DC | PRN
  Administered 2018-03-08: 100 ug via INTRAVENOUS

## 2018-03-08 NOTE — ED Provider Notes (Addendum)
[facility] EMERGENCY DEPARTMENT Provider Note   CSN: [id number] Arrival date & time: 04/05/2018  1629     History   Chief Complaint Chief Complaint  Patient presents with  . Code Stroke    HPI Yvonne Patel is a 73 y.o. female.  The history is provided by the patient. No language interpreter was used.  Cerebrovascular Accident  This is a new problem. The current episode started less than 1 hour ago. The problem occurs constantly. The problem has not changed since onset.Nothing relieves the symptoms. She has tried nothing for the symptoms.  Pt was at [employer] and collapsed.  Pt nonverbal since with facial droop.  Pt unable to give history. Pt is on plavix.    Past Medical History:  Diagnosis Date  . Cardiomyopathy ([place])   . Coronary artery disease   . Hyperlipidemia   . Hypertension   . Neuromuscular disorder (HCC)    neuropathy  . PAD (peripheral artery disease) ([place])   . Peripheral vascular disease ([place])   . Refusal of blood transfusions as patient is Jehovah's Witness   . Retinal disease   . Syncope   . TIA (transient ischemic attack) 2010; 09/2011   denies residual on 03/11/2015  . Type II diabetes mellitus [facility])     Patient Active Problem List   Diagnosis Date Noted  . CVA (cerebral vascular accident) ([place]) 04/07/2018  . Acute CHF (congestive heart failure) ([place]) 06/01/2017  . Cardiomyopathy ([place]) 05/31/2017  . CHF exacerbation ([place]) 05/31/2017  . CKD (chronic kidney disease), stage III ([place]) 07/20/2016  . Diabetes mellitus with complication ([place])   . Elevated troponin   . Altered mental status 07/18/2016  . Carotid stenosis 07/14/2016  . Abnormal stress test 06/21/2016  . 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 H/O: CVA (cerebrovascular accident) 09/30/2011  . URINALYSIS, ABNORMAL 11/28/2009  . MICROALBUMINURIA 08/26/2009  . ANEMIA, IRON DEFICIENCY 12/29/2008  . Anemia 12/11/2008  . 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  PERIPHERAL VASCULAR DISEASE 10/01/2008  . KNEE PAIN, RIGHT 05/07/2008  . NOCTURIA 05/07/2008  . CAROTID BRUITS, BILATERAL 12/13/2007  . Diabetes mellitus ([place]) 11/02/2007  . DIABETIC PERIPHERAL NEUROPATHY 11/02/2007  . Hyperlipidemia 11/02/2007  . HTN (hypertension) 11/02/2007    Past Surgical History:  Procedure Laterality Date  . AORTIC ARCH ANGIOGRAPHY N/A 06/22/2016   Procedure: Aortic Arch Angiography;  Surgeon: [name], MD;  Location: [place] CV LAB;  Service: Cardiovascular;  Laterality: N/A;  . CAROTID ANGIOGRAPHY N/A 06/22/2016   Procedure: Carotid Angiography;  Surgeon: [name], MD;  Location: [place] CV LAB;  Service: Cardiovascular;  Laterality: N/A;  . DILATION AND CURETTAGE OF UTERUS    . ENDARTERECTOMY Left 07/14/2016   Procedure: ENDARTERECTOMY CAROTID LEFT;  Surgeon: Elam Dutch, MD;  Location: [place];  Service: Vascular;  Laterality: Left;  . FEMORAL ARTERY STENT    . LEFT HEART CATH AND CORONARY ANGIOGRAPHY N/A 06/22/2016   Procedure: Left Heart Cath and Coronary Angiography;  Surgeon: [name], MD;  Location: [place] [facility] LAB;  Service: Cardiovascular;  Laterality: N/A;  . LOWER EXTREMITY ANGIOGRAM N/A 06/05/2012   Procedure: LOWER EXTREMITY ANGIOGRAM;  Surgeon: [name], MD;  Location: [facility] CATH LAB;  Service: Cardiovascular;  Laterality: N/A;  . LOWER EXTREMITY ANGIOGRAM N/A 07/11/2012   Procedure: LOWER EXTREMITY ANGIOGRAM;  Surgeon: [name], MD;  Location: [facility] CATH LAB;  Service: Cardiovascular;  Laterality: N/A;  . LOWER EXTREMITY ANGIOGRAM N/A 09/19/2012  Procedure: LOWER EXTREMITY ANGIOGRAM;  Surgeon: [name], MD;  Location: [facility] CATH LAB;  Service: Cardiovascular;  Laterality: N/A;  . PATCH ANGIOPLASTY Left 07/14/2016   Procedure: PATCH ANGIOPLASTY;  Surgeon: Elam Dutch, MD;  Location: [place];  Service: Vascular;  Laterality: Left;  . PERIPHERAL VASCULAR CATHETERIZATION N/A 03/11/2015   Procedure: Renal Angiography;   Surgeon: [name], MD;  Location: [place] CV LAB;  Service: Cardiovascular;  Laterality: N/A;  . RENAL ANGIOGRAM Left 06/05/2012   Procedure: RENAL ANGIOGRAM;  Surgeon: [name], MD;  Location[facility] CATH LAB;  Service: Cardiovascular;  Laterality: Left;  . RENAL ANGIOGRAPHY  06/22/2016   Procedure: Renal Angiography;  Surgeon: [name], MD;  Location: [place] CV LAB;  Service: Cardiovascular;;  . RENAL ARTERY ANGIOPLASTY Right 03/11/2015  . TUBAL LIGATION    . VAGINAL HYSTERECTOMY       OB History   No obstetric history on file.      Home Medications    Prior to Admission medications   Medication Sig Start Date End Date Taking? Authorizing Provider  aspirin EC 81 MG tablet Take 81 mg by mouth at bedtime.     [provider]  atorvastatin (LIPITOR) 80 MG tablet Take 80 mg by mouth daily at 6 PM.  06/01/16   [provider]  carvedilol (COREG) 3.125 MG tablet Take 1 tablet (3.125 mg total) by mouth 2 (two) times daily. 06/03/17 06/03/18  Lavina Hamman, MD  clopidogrel (PLAVIX) 75 MG tablet Take 75 mg by mouth at bedtime.     [provider]  Ferrous Fumarate (HEMOCYTE - 106 MG FE) 324 (106 Fe) MG TABS tablet Take 1 tablet (106 mg of iron total) by mouth 2 (two) times daily. 07/24/16   Oswald Hillock, MD  furosemide (LASIX) 20 MG tablet Take 1 tablet (20 mg total) by mouth daily as needed. For weight gain of 3 lbs in 1 day or 5Lbs in 2 days. 06/03/17 06/03/18  Lavina Hamman, MD  insulin degludec (TRESIBA FLEXTOUCH) 100 UNIT/ML SOPN FlexTouch Pen Inject 15 Units into the skin every morning.     [provider]  metFORMIN (GLUCOPHAGE) 500 MG tablet Take 2 tablets (1,000 mg total) by mouth 2 (two) times daily. 06/24/16   [name], MD    Family History Family History  Problem Relation Age of Onset  . Breast cancer Neg Hx     Social History Social History   Tobacco Use  . Smoking status: Former Smoker    Packs/day: 0.50    Years: 30.00      Pack years: 15.00    Types: Cigarettes    Start date: 03/28/1965    Last attempt to quit: 03/31/1975    Years since quitting: 42.9  . Smokeless tobacco: Never Used  Substance Use Topics  . Alcohol use: Yes    Comment: 03/11/2015 'drink at gatherings a couple times/year"  . Drug use: No     Allergies   No known allergies   Review of Systems Review of Systems  Unable to perform ROS: Mental status change     Physical Exam Updated Vital Signs Wt 77.7 kg   BMI 28.51 kg/m   Physical Exam HENT:     Head: Normocephalic.     Right Ear: External ear normal.     Left Ear: External ear normal.     Nose: Nose normal.     Mouth/Throat:     Mouth: Mucous membranes  are moist.  Eyes:     Pupils: Pupils are equal, round, and reactive to light.  Cardiovascular:     Rate and Rhythm: Normal rate.     Pulses: Normal pulses.  Skin:    General: Skin is warm.  Neurological:     Motor: Weakness present.     Comments: Facial droop       ED Treatments / Results  Labs (all labs ordered are listed, but only abnormal results are displayed) Labs Reviewed  APTT - Abnormal; Notable for the following components:      Result Value   aPTT 20 (*)    All other components within normal limits  I-STAT TROPONIN, ED - Abnormal; Notable for the following components:   Troponin i, poc 5.71 (*)    All other components within normal limits  CBG MONITORING, ED - Abnormal; Notable for the following components:   Glucose-Capillary 175 (*)    All other components within normal limits  I-STAT CHEM 8, ED - Abnormal; Notable for the following components:   BUN 32 (*)    Creatinine, Ser 2.20 (*)    Glucose, Bld 180 (*)    Calcium, Ion 1.01 (*)    All other components within normal limits  PROTIME-INR  CBC  DIFFERENTIAL  COMPREHENSIVE METABOLIC PANEL  HEMOGLOBIN A1C  LIPID PANEL    EKG   Radiology Ct Head Code Stroke Wo Contrast  Result Date: 03/29/2018 CLINICAL DATA:  Code stroke.   Deficit not specified EXAM: CT HEAD WITHOUT CONTRAST TECHNIQUE: Contiguous axial images were obtained from the base of the skull through the vertex without intravenous contrast. COMPARISON:  Head CT 07/18/2016 and brain MRI 07/20/2016 FINDINGS: Brain: Multiple remote lacunar infarcts in the deep white matter and deep gray nuclei, chronic when compared to prior head CT and brain MRI. Small remote left cerebellar infarct. No evidence of acute infarct. No hemorrhage or hydrocephalus. Vascular: No hyperdense vessel. Skull: Normal. Negative for fracture or focal lesion. Sinuses/Orbits: No acute finding. Other: These results were communicated to Dr. Rory Percy at 4:43 pmon 01/28/2020by text page via the [facility] messaging system. ASPECTS [facility] Stroke Program Early CT Score) Not scored without localizing symptoms. Motion artifact at the vertex blurring cortex. IMPRESSION: 1. No acute finding. 2. Motion artifact at the vertex. 3. Multiple remote small vessel infarcts. Electronically Signed   By: Monte Fantasia M.D.   On: 03/12/2018 16:45    Procedures Procedures (including critical care time)  Medications Ordered in ED Medications   stroke: mapping our early stages of recovery book (has no administration in time range)  0.9 %  sodium chloride infusion (has no administration in time range)  acetaminophen (TYLENOL) tablet 650 mg (has no administration in time range)    Or  acetaminophen (TYLENOL) solution 650 mg (has no administration in time range)    Or  acetaminophen (TYLENOL) suppository 650 mg (has no administration in time range)  senna-docusate (Senokot-S) tablet 1 tablet (has no administration in time range)  pantoprazole (PROTONIX) injection 40 mg (has no administration in time range)  labetalol (NORMODYNE,TRANDATE) injection 20 mg (has no administration in time range)    And  clevidipine (CLEVIPREX) infusion 0.5 mg/mL (has no administration in time range)  alteplase (ACTIVASE) 1 mg/mL infusion 69.9 mg  (has no administration in time range)  iopamidol (ISOVUE-370) 76 % injection 50 mL (50 mLs Intravenous Contrast Given 03/28/2018 1646)     Initial Impression / Assessment and Plan / ED Course  I have reviewed  the triage vital signs and the nursing notes.  Pertinent labs & imaging results that were available during my care of the patient were reviewed by me and considered in my medical decision making (see chart for details).     Stroke team evaluated at bridge. Pt's airway clear.  Pt to CT.  Stroke neurology assumed care. Pt will be given Activase.   Final Clinical Impressions(s) / ED Diagnoses   Final diagnoses:  Cerebrovascular accident (CVA) due to other mechanism [facility])    ED Discharge Orders    None       Sidney Ace 03/26/2018 1720    Lajean Saver, MD 03/09/18 [address], [place] 04/13/18 4103    Lajean Saver, MD 04/13/18 [phone]

## 2018-03-08 NOTE — Sedation Documentation (Signed)
Confirmed bed assignment with Ander Purpura, RN 4N charge. Requested full O2 tank be placed on the bed.

## 2018-03-08 NOTE — Code Documentation (Signed)
Patient in check out line at [facility] this afternoon when she suddenly collapsed to the floor.  Per reports she did not hit anything but lowered herself to the floor.  LKW at 1600  Code stroke called en route at 16:17.  Patient arrived via EMS at 1629. Dr Rory Percy at bedside to assess patient. Stat labs and head CT done.  TPA started at 1651.  CTA done.  NIHSS Code stroke IR activation at 1706.  Foley placed. Daughter at bedside.  Patient in IR at 1724.  Dr Rory Percy and Dr Earleen Newport spoke with daughter.  Hand off with IR team.

## 2018-03-08 NOTE — Progress Notes (Addendum)
NeuroInterventional Radiology  Pre-Procedure Note  History: 73 yo female with acute stroke, left MCA m1 occlusion.  Presents to [facility].  Candidate for tPA and mechanical thrombectomy.   Baseline mRS: 0 NIHSS:  23  CT ASPECTS: 10 CTA:   Left M1 occlusion CTP:   NA   Given the patient's symptoms, imaging findings, baseline function, I believe they are an appropriate candidate for mechanical thrombectomy.  I have discussed case with Dr. Rory Percy and he agrees.    The risks and benefits of the procedure were discussed with the patient/patient's family, with specific risks including: bleeding, infection, arterial injury/dissection, contrast reaction, kidney injury, need for further procedure/surgery, neurologic deficit, 10-15% risk of intracranial hemorrhage, cardiopulmonary collapse, death. All questions were answered.  The daughter believes that her mother would, in a usual state, agree to proceed with attempt at thrombectomy, and she has signed on her behalf.  [place], [phone].   Plan for cerebral angiogram and attempt at mechanical thrombectomy.   Signed,   Dulcy Fanny. Earleen Newport, DO

## 2018-03-08 NOTE — Anesthesia Preprocedure Evaluation (Signed)
Anesthesia Evaluation  Patient identified by MRN, date of birth, ID band Patient unresponsive    Reviewed: Unable to perform ROS - Chart review onlyPreop documentation limited or incomplete due to emergent nature of procedure.  Airway Mallampati: I  TM Distance: >3 FB Neck ROM: Full    Dental  (+) Edentulous Upper, Edentulous Lower   Pulmonary former smoker,    Pulmonary exam normal        Cardiovascular hypertension, Pt. on home beta blockers + CAD, + Peripheral Vascular Disease and +CHF  Normal cardiovascular exam  Study Conclusions  - Left ventricle: The cavity size was normal. There was severe   concentric hypertrophy. The appearance was consistent with   hypertensive changes. Amyloid infiltration cannot be excluded.   Systolic function was mildly to moderately reduced. The estimated   ejection fraction was in the range of 40% to 45%. Diffuse   hypokinesis. Features are consistent with a pseudonormal left   ventricular filling pattern, with concomitant abnormal relaxation   and increased filling pressure (grade 2 diastolic dysfunction).   Doppler parameters are consistent with both elevated ventricular   end-diastolic filling pressure and elevated left atrial filling   pressure. - Mitral valve: Moderately calcified annulus. Diffuse of the   posterior leaflet, consistent with calcific degeneration. There   was mild regurgitation directed centrally. - Left atrium: The atrium was moderately to severely dilated. - Atrial septum: No defect or patent foramen ovale was identified. - Pericardium, extracardiac: A trivial pericardial effusion was   identified.  Ost RCA lesion, 100 %stenosed.  Ost 1st Diag to 1st Diag lesion, 80 %stenosed.  Ost 2nd Mrg to 2nd Mrg lesion, 80 %stenosed.  There is mild left ventricular systolic dysfunction.  LV end diastolic pressure is normal.  The left ventricular ejection fraction is  35-45% by visual estimate.     Neuro/Psych  Neuromuscular disease CVA    GI/Hepatic   Endo/Other  diabetes  Renal/GU Renal InsufficiencyRenal disease     Musculoskeletal   Abdominal   Peds  Hematology  (+) JEHOVAH'S WITNESS  Anesthesia Other Findings   Reproductive/Obstetrics                             Anesthesia Physical Anesthesia Plan  ASA: IV and emergent  Anesthesia Plan: General   Post-op Pain Management:    Induction: Intravenous  PONV Risk Score and Plan: 3 and Ondansetron, Diphenhydramine and Treatment may vary due to age or medical condition  Airway Management Planned: Oral ETT  Additional Equipment: Arterial line  Intra-op Plan:   Post-operative Plan: Post-operative intubation/ventilation  Informed Consent: I have reviewed the patients History and Physical, chart, labs and discussed the procedure including the risks, benefits and alternatives for the proposed anesthesia with the patient or authorized representative who has indicated his/her understanding and acceptance.     Plan Discussed with: CRNA, Anesthesiologist and Surgeon  Anesthesia Plan Comments:         Anesthesia Quick Evaluation

## 2018-03-08 NOTE — Progress Notes (Signed)
Pt transported from IR to 4N 22 with no complications.

## 2018-03-08 NOTE — Procedures (Signed)
Neuro-Interventional Radiology  Post Cerebral Angiogram Procedure Note  History:   73 yo female presents with acute left MCA stroke, within the window for tPA and for mechanical thrombectomy   Baseline mRS:  0 NIHSS:   49 Last Known Well:  3:30pm ASPECTS:   10 Site of occlusion:  Left m1   Skin Puncture:   17:57 First Pass Date & Time: 18:37  Device:  4x40 solitaire + local aspiration with ACE 64  Result: TICI 2a Second Pass   18:52  Device:  4x40 solitaire + local aspiration with ACE 64  Result: TICI 2b  IV tPA administered?: yes IA Medication:  No   Final mTICI Score & time: 18:512 & TICI 2b Anesthesia    GETA  Procedure:  - US guided R CFA access for case - US guided L CFA access with 33F sheath for arterial monitoring, after anesthesia unable to establish left radial art line - In order to pass the 431F sheath into the aorta, balloon angioplasty of right common iliac artery was required, to 52mm and 75mm.   - Cerebral Angiogram - Mechanical thrombectomy of ELVO involving left m1/MCA - Deployment of Angioseal for hemostasis - left CFA 33F sheath remains in place for arterial monitoring  Findings:  Start of case physical exam + palpable left CFA pulse, + doppler left PT, - doppler left DP + palpable right CFA pulse, + doppler right PT, + doppler right DP End of case physical exam + palpable left CFA pulse, + doppler left PT, - doppler left DP + doppler of the right DP, + doppler of the right PT (monophasic signal with SBP >200)  Imaging: Extensive calcified athero Stenosis of the right CIA/stent, requiring balloon angioplasty to pass 431F sheath Left MCA occlusion at M1 segment SP 2 passes of combo mechanical/aspiration thrombectomy, restoration of TICI 2b flow.   Labs: Last Troponin I = 4.6  Complications: None  EBL: 50cc  Recommendations: - right hip straight overnight - left hip straight with the 33F art line in place.  To be removed by VIR tomorrow - to CT  now - Goal SBP 140 - 160 given TICI 2b flow   - Frequent NV checks - note, the right pedal pulses are monophasic, found when SBP was >200, and fade when the SBP is 140-160 at goal.   - NIR to follow  Signed,  Dulcy Fanny. Earleen Newport, DO

## 2018-03-08 NOTE — Progress Notes (Signed)
Echocardiogram Complete  Madelaine Etienne,  RDCS

## 2018-03-08 NOTE — Progress Notes (Signed)
eLink Physician-Brief Progress Note Patient Name: Yvonne Patel DOB: 1945/10/19 MRN: [id number]   Date of Service  04/03/2018  HPI/Events of Note  Multiple issues: 1. Patient intubated and ventilated - Request for sedation and 2. Hypotension. Goal SBP = 140-160.  eICU Interventions  Will order: 1. Propofol IV infusion. Titrate to RASS = 0. 2. Phenylephrine IV infusion. Titrate to RASS = 0.      Intervention Category Major Interventions: Hypotension - evaluation and management Minor Interventions: Agitation / anxiety - evaluation and management  [place],Steven Eugene 04/04/2018, 8:45 PM

## 2018-03-08 NOTE — Consult Note (Addendum)
Reason for Consult: Troponin elevation Referring Physician: Kerney Elbe, MD  Yvonne Patel is an 73 y.o. female.  HPI:   73 year old [name] with severe atherosclerotic cardiovascular disease with medically managed diffuse CAD, heart failure with mildly reduced EF 40-45%, prior CVA,PAD including b/l renal artery stenting, Rt CIA stenting, h/o left carotid endarterctomy, known right subclavian artery stenosis,  Jehovah's witness, type 2 DM, hypertension.  Patient was admitted today with right sided facial droop, right arm weakness  and collapse.She was found to have acute left MCA CVA. She underwent tPA and mechanical thrombectomy. She currently remains intubated, and on mechanical ventilation. cardiology were consulted due to her troponin elevation.   Past Medical History:  Diagnosis Date  . Cardiomyopathy ([place])   . Coronary artery disease   . Hyperlipidemia   . Hypertension   . Neuromuscular disorder ([facility])    neuropathy  . PAD (peripheral artery disease) ([place])   . Peripheral vascular disease ([place])   . Refusal of blood transfusions as patient is Jehovah's Witness   . Retinal disease   . Syncope   . TIA (transient ischemic attack) 2010; 09/2011   denies residual on 03/11/2015  . Type II diabetes mellitus ([place])     Past Surgical History:  Procedure Laterality Date  . AORTIC ARCH ANGIOGRAPHY N/A 06/22/2016   Procedure: Aortic Arch Angiography;  Surgeon: [name], MD;  Location: [facility];  Service: Cardiovascular;  Laterality: N/A;  . CAROTID ANGIOGRAPHY N/A 06/22/2016   Procedure: Carotid Angiography;  Surgeon: [name], MD;  Location: [facility];  Service: Cardiovascular;  Laterality: N/A;  . DILATION AND CURETTAGE OF UTERUS    . ENDARTERECTOMY Left 07/14/2016   Procedure: ENDARTERECTOMY CAROTID LEFT;  Surgeon: Elam Dutch, MD;  Location: [facility];  Service: Vascular;  Laterality: Left;  . FEMORAL ARTERY STENT    . IR ANGIOGRAM EXTREMITY BILATERAL   04/04/2018  . IR PERCUTANEOUS ART THROMBECTOMY/INFUSION INTRACRANIAL INC DIAG ANGIO  04/06/2018  . IR US GUIDE VASC ACCESS RIGHT  04/04/2018  . LEFT HEART CATH AND CORONARY ANGIOGRAPHY N/A 06/22/2016   Procedure: Left Heart Cath and Coronary Angiography;  Surgeon: [name], MD;  Location: [facility];  Service: Cardiovascular;  Laterality: N/A;  . LOWER EXTREMITY ANGIOGRAM N/A 06/05/2012   Procedure: LOWER EXTREMITY ANGIOGRAM;  Surgeon: [name], MD;  Location: [facility] CATH LAB;  Service: Cardiovascular;  Laterality: N/A;  . LOWER EXTREMITY ANGIOGRAM N/A 07/11/2012   Procedure: LOWER EXTREMITY ANGIOGRAM;  Surgeon: [name], MD;  Location: [facility] CATH LAB;  Service: Cardiovascular;  Laterality: N/A;  . LOWER EXTREMITY ANGIOGRAM N/A 09/19/2012   Procedure: LOWER EXTREMITY ANGIOGRAM;  Surgeon: [name], MD;  Location: [facility] CATH LAB;  Service: Cardiovascular;  Laterality: N/A;  . PATCH ANGIOPLASTY Left 07/14/2016   Procedure: PATCH ANGIOPLASTY;  Surgeon: Elam Dutch, MD;  Location: [facility];  Service: Vascular;  Laterality: Left;  . PERIPHERAL VASCULAR CATHETERIZATION N/A 03/11/2015   Procedure: Renal Angiography;  Surgeon: [name], MD;  Location: [facility];  Service: Cardiovascular;  Laterality: N/A;  . RENAL ANGIOGRAM Left 06/05/2012   Procedure: RENAL ANGIOGRAM;  Surgeon: [name], MD;  Location: [facility] C[facility] LAB;  Service: Cardiovascular;  Laterality: Left;  . RENAL ANGIOGRAPHY  06/22/2016   Procedure: Renal Angiography;  Surgeon: [name], MD;  Location: [place] [facility];  Service: Cardiovascular;;  . RENAL ARTERY ANGIOPLASTY Right 03/11/2015  . TUBAL LIGATION    .  VAGINAL HYSTERECTOMY      Family History  Problem Relation Age of Onset  . Breast cancer Neg Hx     Social History:  reports that she quit smoking about 42 years ago. Her smoking use included cigarettes. She started smoking about 52 years ago. She has a 15.00 pack-year smoking history. She has never  used smokeless tobacco. She reports current alcohol use. She reports that she does not use drugs.  Allergies:  Allergies  Allergen Reactions  . No Known Allergies     Medications: I have reviewed the patient's current medications.  Results for orders placed or performed during the hospital encounter of 04/06/2018 (from the past 48 hour(s))  CBG monitoring, ED     Status: Abnormal   Collection Time: 03/13/2018  4:31 PM  Result Value Ref Range   Glucose-Capillary 175 (H) 70 - 99 mg/dL   Comment 1 Notify RN    Comment 2 Document in Chart   I-stat troponin, ED     Status: Abnormal   Collection Time: 03/11/2018  4:40 PM  Result Value Ref Range   Troponin i, poc 5.71 (HH) 0.00 - 0.08 ng/mL   Comment NOTIFIED PHYSICIAN    Comment 3            Comment: Due to the release kinetics of cTnI, a negative result within the first hours of the onset of symptoms does not rule out myocardial infarction with certainty. If myocardial infarction is still suspected, repeat the test at appropriate intervals.   I-Stat Chem 8, ED     Status: Abnormal   Collection Time: 03/21/2018  4:42 PM  Result Value Ref Range   Sodium 138 135 - 145 mmol/L   Potassium 3.8 3.5 - 5.1 mmol/L   Chloride 102 98 - 111 mmol/L   BUN 32 (H) 8 - 23 mg/dL   Creatinine, Ser 2.20 (H) 0.44 - 1.00 mg/dL   Glucose, Bld 180 (H) 70 - 99 mg/dL   Calcium, Ion 1.01 (L) 1.15 - 1.40 mmol/L   TCO2 29 22 - 32 mmol/L   Hemoglobin 13.9 12.0 - 15.0 g/dL   HCT 41.0 36.0 - 46.0 %  Protime-INR     Status: None   Collection Time: 03/14/2018  4:56 PM  Result Value Ref Range   Prothrombin Time 13.1 11.4 - 15.2 seconds   INR 1.00     Comment: Performed at [facility], [place] [address] [place], [place] [address]  APTT     Status: Abnormal   Collection Time: 03/28/2018  4:56 PM  Result Value Ref Range   aPTT 20 (L) 24 - 36 seconds    Comment: Performed at [facility] [address], [place], [place] [address]  CBC     Status: None    Collection Time: 04/06/2018  4:56 PM  Result Value Ref Range   WBC 6.3 4.0 - 10.5 K/uL   RBC 4.09 3.87 - 5.11 MIL/uL   Hemoglobin 12.2 12.0 - 15.0 g/dL   HCT 38.5 36.0 - 46.0 %   MCV 94.1 80.0 - 100.0 fL   MCH 29.8 26.0 - 34.0 pg   MCHC 31.7 30.0 - 36.0 g/dL   RDW 12.5 11.5 - 15.5 %   Platelets 174 150 - 400 K/uL    Comment: REPEATED TO VERIFY   nRBC 0.0 0.0 - 0.2 %    Comment: Performed at [facility], [place] [address], [place], [place] [address]  Differential  Status: None   Collection Time: 04/03/2018  4:56 PM  Result Value Ref Range   Neutrophils Relative % 50 %   Neutro Abs 3.2 1.7 - 7.7 K/uL   Lymphocytes Relative 38 %   Lymphs Abs 2.4 0.7 - 4.0 K/uL   Monocytes Relative 9 %   Monocytes Absolute 0.5 0.1 - 1.0 K/uL   Eosinophils Relative 2 %   Eosinophils Absolute 0.1 0.0 - 0.5 K/uL   Basophils Relative 1 %   Basophils Absolute 0.1 0.0 - 0.1 K/uL   Immature Granulocytes 0 %   Abs Immature Granulocytes 0.01 0.00 - 0.07 K/uL    Comment: Performed at [facility] [address] [place], [place] [address]  Comprehensive metabolic panel     Status: Abnormal   Collection Time: 03/20/2018  4:56 PM  Result Value Ref Range   Sodium 140 135 - 145 mmol/L   Potassium 3.3 (L) 3.5 - 5.1 mmol/L   Chloride 101 98 - 111 mmol/L   CO2 25 22 - 32 mmol/L   Glucose, Bld 191 (H) 70 - 99 mg/dL   BUN 32 (H) 8 - 23 mg/dL   Creatinine, Ser 2.25 (H) 0.44 - 1.00 mg/dL   Calcium 9.3 8.9 - 10.3 mg/dL   Total Protein 7.4 6.5 - 8.1 g/dL   Albumin 3.0 (L) 3.5 - 5.0 g/dL   AST 27 15 - 41 U/L   ALT 26 0 - 44 U/L   Alkaline Phosphatase 97 38 - 126 U/L   Total Bilirubin 0.7 0.3 - 1.2 mg/dL   GFR calc non Af Amer 21 (L) >60 mL/min   GFR calc Af Amer 24 (L) >60 mL/min   Anion gap 14 5 - 15    Comment: Performed at [facility], [place] [address], [place], [place] [address]  Troponin I - Now Then Q6H     Status: Abnormal   Collection Time: 03/31/2018  6:31 PM  Result Value Ref Range    Troponin I 4.62 (HH) <0.03 ng/mL    Comment: CRITICAL RESULT CALLED TO, READ BACK BY AND VERIFIED WITH: C HANNER,RN AT 1920 03/11/2018 BY L BENFIELD Performed at [facility], [place] [address] [place], [place] [address]     Ct Angio Head W Or Wo Contrast  Result Date: 04/07/2018 CLINICAL DATA:  Code stroke, deficit not specified EXAM: CT ANGIOGRAPHY HEAD AND NECK TECHNIQUE: Multidetector CT imaging of the head and neck was performed using the standard protocol during bolus administration of intravenous contrast. Multiplanar CT image reconstructions and MIPs were obtained to evaluate the vascular anatomy. Carotid stenosis measurements (when applicable) are obtained utilizing NASCET criteria, using the distal internal carotid diameter as the denominator. CONTRAST:  18m ISOVUE-370 IOPAMIDOL (ISOVUE-370) INJECTION 76% COMPARISON:  None. FINDINGS: CTA NECK FINDINGS Aortic arch: Extensive atherosclerotic calcification. Three vessel branching Right carotid system: Diffuse mainly calcified atherosclerotic plaque which limits visualization of the lumen due to plaque blooming. There is distal common carotid stenosis that measures up to 70% (when compared to the more proximal vessel given the immediate downstream bifurcation). There is ICA bulb plaque with 70% stenosis. Left carotid system: Much less extensive atherosclerotic plaque, question prior carotid endarterectomy. No stenosis or ulceration. Vertebral arteries: Severe bilateral proximal subclavian stenosis, with possible occlusion on the right. Narrowing on the left involves and extensive segment. No flow is seen within vertebral arteries until the V3 segments, there may be retrograde flow Skeleton: Diffuse degenerative disease.  No acute finding  Other neck: Bilateral cataract resection. Upper chest: Hazy density of the lungs which could be from atelectasis. No Kerley lines to implicate edema. Review of the MIP images confirms the above findings CTA HEAD  FINDINGS Anterior circulation: Confluent atherosclerotic calcification on the carotid siphons with small vessel size and plaque blooming limiting luminal visualization. There is a left M2 branch occlusion beginning at the M1 bifurcation with subsequent downstream reconstitution. No contralateral embolism is seen. Posterior circulation: Tiny vertebrobasilar arteries. There may be a basilar interruption from atherosclerosis or developmental variant. Fetal type flow to both posterior cerebral arteries which are symmetrically patent Venous sinuses: Patent as permitted by contrast timing Anatomic variants: As above Delayed phase: Not obtained in the emergent setting Review of the MIP images confirms the above findings Case discussed with Dr. Rory Percy at 5:04 p.m. IMPRESSION: 1. Left M2 branch occlusion beginning at the M1 bifurcation. 2. Severe atherosclerosis which preferentially spares the left carotid circulation, question prior endarterectomy. 3. Severe atheromatous narrowing if not occlusion of bilateral proximal subclavian arteries. There is bilateral fetal type PCA with diffuse thready flow in the posterior fossa. 4. 70% atheromatous narrowing of the right common carotid and ICA bulb. Electronically Signed   By: Monte Fantasia M.D.   On: 04/05/2018 17:16   Ct Head Wo Contrast  Result Date: 04/04/2018 CLINICAL DATA:  Post thrombectomy EXAM: CT HEAD WITHOUT CONTRAST TECHNIQUE: Contiguous axial images were obtained from the base of the skull through the vertex without intravenous contrast. COMPARISON:  Head CT from earlier today FINDINGS: Brain: 3 mm focus of high density along the high and posterior right frontal convexity, on reformats this appears to be intraparenchymal. No hemorrhagic conversion or visible infarct in the area of procedure. Multiple remote small vessel infarcts. Vascular: Major vessels are symmetrically enhancing. Skull: Negative Sinuses/Orbits: Negative These results were called by telephone at  the time of interpretation on 03/16/2018 at 8:17 pm to Dr. Cheral Marker , who verbally acknowledged these results. IMPRESSION: New 3 mm high-density within the posterior right frontal cortex which could be focus of hemorrhage or an enhancing subacute infarct. No hemorrhage or infarct seen along the postoperative left MCA distribution. Electronically Signed   By: Monte Fantasia M.D.   On: 03/26/2018 20:18   Ct Angio Neck W Or Wo Contrast  Result Date: 03/11/2018 CLINICAL DATA:  Code stroke, deficit not specified EXAM: CT ANGIOGRAPHY HEAD AND NECK TECHNIQUE: Multidetector CT imaging of the head and neck was performed using the standard protocol during bolus administration of intravenous contrast. Multiplanar CT image reconstructions and MIPs were obtained to evaluate the vascular anatomy. Carotid stenosis measurements (when applicable) are obtained utilizing NASCET criteria, using the distal internal carotid diameter as the denominator. CONTRAST:  39m ISOVUE-370 IOPAMIDOL (ISOVUE-370) INJECTION 76% COMPARISON:  None. FINDINGS: CTA NECK FINDINGS Aortic arch: Extensive atherosclerotic calcification. Three vessel branching Right carotid system: Diffuse mainly calcified atherosclerotic plaque which limits visualization of the lumen due to plaque blooming. There is distal common carotid stenosis that measures up to 70% (when compared to the more proximal vessel given the immediate downstream bifurcation). There is ICA bulb plaque with 70% stenosis. Left carotid system: Much less extensive atherosclerotic plaque, question prior carotid endarterectomy. No stenosis or ulceration. Vertebral arteries: Severe bilateral proximal subclavian stenosis, with possible occlusion on the right. Narrowing on the left involves and extensive segment. No flow is seen within vertebral arteries until the V3 segments, there may be retrograde flow Skeleton: Diffuse degenerative disease.  No acute finding Other neck: Bilateral  cataract resection.  Upper chest: Hazy density of the lungs which could be from atelectasis. No Kerley lines to implicate edema. Review of the MIP images confirms the above findings CTA HEAD FINDINGS Anterior circulation: Confluent atherosclerotic calcification on the carotid siphons with small vessel size and plaque blooming limiting luminal visualization. There is a left M2 branch occlusion beginning at the M1 bifurcation with subsequent downstream reconstitution. No contralateral embolism is seen. Posterior circulation: Tiny vertebrobasilar arteries. There may be a basilar interruption from atherosclerosis or developmental variant. Fetal type flow to both posterior cerebral arteries which are symmetrically patent Venous sinuses: Patent as permitted by contrast timing Anatomic variants: As above Delayed phase: Not obtained in the emergent setting Review of the MIP images confirms the above findings Case discussed with Dr. Rory Percy at 5:04 p.m. IMPRESSION: 1. Left M2 branch occlusion beginning at the M1 bifurcation. 2. Severe atherosclerosis which preferentially spares the left carotid circulation, question prior endarterectomy. 3. Severe atheromatous narrowing if not occlusion of bilateral proximal subclavian arteries. There is bilateral fetal type PCA with diffuse thready flow in the posterior fossa. 4. 70% atheromatous narrowing of the right common carotid and ICA bulb. Electronically Signed   By: Monte Fantasia M.D.   On: 03/24/2018 17:16   Ir Angiogram Extremity Bilateral  Result Date: 03/10/2018 INDICATION: 73 year old female with a history of acute left MCA stroke. She presents within time frame for IV tPA, and is a candidate for mechanical thrombectomy EXAM: ULTRASOUND GUIDED ACCESS RIGHT COMMON FEMORAL ARTERY ULTRASOUND GUIDED ACCESS LEFT COMMON FEMORAL ARTERY FOR ARTERIAL MONITORING ANGIOGRAM RIGHT LOWER EXTREMITY BALLOON ANGIOPLASTY STENOSIS RIGHT COMMON ILIAC ARTERY IN ORDER TO PASS 8 FRENCH SHEATH TO COMPLETE THE  THROMBECTOMY CEREBRAL ANGIOGRAM MECHANICAL THROMBECTOMY LEFT MCA EMERGENT LARGE VESSEL OCCLUSION DEPLOYMENT OF ANGIO-SEAL RIGHT COMMON FEMORAL ARTERY COMPARISON:  CT imaging of the same day MEDICATIONS: 2 g Ancef IV. The antibiotic was administered within 1 hour of the procedure ANESTHESIA/SEDATION: General endotracheal tube anesthesia CONTRAST:  96 cc Isovue-300 FLUOROSCOPY TIME:  Fluoroscopy Time: 26 minutes 24 seconds (1,362 mGy). COMPLICATIONS: None TECHNIQUE: Informed written consent was obtained from the patient's family after a thorough discussion of the procedural risks, benefits and alternatives. Specific risks discussed include: Bleeding, infection, contrast reaction, kidney injury/failure, need for further procedure/surgery, arterial injury or dissection, embolization to new territory, intracranial hemorrhage (10-15% risk), neurologic deterioration, cardiopulmonary collapse, death. All questions were addressed. Maximal Sterile Barrier Technique was utilized including during the procedure including caps, mask, sterile gowns, sterile gloves, sterile drape, hand hygiene and skin antiseptic. A timeout was performed prior to the initiation of the procedure. The anesthesia team was present to provide general endotracheal tube anesthesia and for patient monitoring during the procedure. Interventional neuro radiology nursing staff was also present. 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 Surgical changes of prior endarterectomy evident on prior CT imaging. Left external carotid artery: Patent with antegrade flow. Left internal carotid artery: Normal course caliber and contour of the cervical portion. Vertical and petrous segment patent with normal course caliber contour. Cavernous segment patent. Clinoid segment patent. Antegrade flow of the ophthalmic artery. Ophthalmic segment patent.  Terminus patent. Left MCA: Distal M1 occluded at the initiation of the case. There is partial filling of an inferior branch which is the non dominant branch to the frontal. Patent fetal PCA to the left P2 segment. Left ACA: A 1 segment patent. A 2 segment perfuses the  right territory. Completion Findings: Left MCA: After 2 passes of mechanical thrombectomy plus aspiration, there is near complete restoration of flow through the left MCA territory, with slow flow through an M3/M4 branch of the parietal region compatible with TI CI 2b flow. 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 Physical exam At the initiation of the case, palpable bilateral common femoral artery pulses present No palpable distal pulses Doppler positive pulses of the right posterior tibial and anterior tibial (with systolic blood pressures above 200) Doppler positive pulse at the left posterior tibial (with systolic blood pressure above 200), with no Doppler signal at the left anterior tibial At completion of case Doppler positive pulses were only discovered on the right posterior tibial and anterior tibial arteries with blood pressures above 200. In the 140-160 range (goal) Doppler signal was not evident A completion of case Doppler positive pulses were only discovered on the left at the posterior tibial and not the left anterior tibial (with the pressure above 786 systolic). PROCEDURE: Patient is brought emergently to the neuro angiography suite, with the patient identified appropriately and placed supine position on the table. Left radial arterial line was attempted by the anesthesia team. The patient is then prepped and draped in the usual sterile fashion. Ultrasound survey of the right inguinal region was performed with images stored and sent to PACs. 11 blade scalpel was used to make a small incision. Blunt dissection was performed. A micropuncture needle was used access the right common femoral artery  under ultrasound. With excellent arterial blood flow returned, an .018 micro wire was passed through the needle, observed to enter the abdominal aorta under fluoroscopy. The needle was removed, and a micropuncture sheath was placed over the wire. The inner dilator and wire were removed, and an 035 Bentson wire was advanced under fluoroscopy into the abdominal aorta. The sheath was removed and a standard 5 [place] vascular sheath was placed. The dilator was removed and the sheath was flushed. Ultrasound survey of the left inguinal region was then performed with images stored and sent to PACs, confirming patency of the vessel. A micropuncture needle was used access the left common femoral artery under ultrasound. With excellent arterial blood flow returned, and an .018 micro wire was passed through the needle, observed enter the abdominal aorta under fluoroscopy. The needle was removed, and a micropuncture sheath was placed over the wire. The inner dilator and wire were removed, and an 035 Bentson wire was advanced under fluoroscopy into the abdominal aorta. The sheath was removed and a standard 4 [place] vascular sheath was placed for arterial monitoring. The dilator was removed and the sheath was flushed. A 73F JB-1 diagnostic catheter was then advanced over the wire through the existing right femoral access to the proximal descending thoracic aorta. Wire was then removed. Double flush of the catheter was performed. Catheter was then used to select the left cervical internal carotid artery. Formal angiogram was performed, with roadmap achieved. Exchange length Rosen wire was then passed through the diagnostic catheter to the distal cervical ICA and the diagnostic catheter was removed. The 5 French sheath was removed, with attempt at placing 8 French 55 cm bright tip sheath. Significant resistance was encountered with passing the sheath through the right iliac system. The sheath would not advanced through the  pre-existing right common iliac artery stent. Sheath was withdrawn and rotated. We attempted to advance the sheath over the introducer which was pinned on the wire. We also removed the  introducer in place to diagnostic catheter into the aorta and attempted to pass the sheath over the diagnostic catheter. None of these maneuvers were successful. Sheath was then withdrawn into the external iliac artery. Balloon angioplasty was performed of the pre-existing right common iliac artery stent with 5 mm x 40 mm standard balloon angioplasty. Balloon was removed and the introducer was then replaced. Eight French sheath was then again attempted to pass over the wire, unsuccessful through this segment of the iliac artery. The introducer was removed, diagnostic catheter was placed into the cervical segment of the internal carotid artery, and the Rose an wire was exchanged for a 260 cm Amplatz wire. With the stiff wire in place, another attempt was made to pass the sheath which was unsuccessful. Sheath was then again withdrawn to the external iliac artery, introducer was removed, and a second balloon angioplasty 6 mm x 40 mm was performed at the pre-existing stent. The sheath was then successfully advanced over the balloon as the balloon was deflated, into the abdominal aorta. The introducer was again placed after removing the balloon and the sheath was advanced 6 successfully into the aorta. Introducer was withdrawn. Once the sheath was advanced over the wire to the thoracic aorta, sheath was flushed and attached to pressurized and heparinized saline bag for constant forward flow. Eight French 95 cm flow gate balloon catheter was then advanced over the wire to the distal cervical segment. Wire was removed. Then a coaxial intermediate catheter and microcatheter combination was prepared on the back table. This combination was penumbra Ace 64 catheter and a Trevo Provue18 microcatheter, with a synchro soft wire. This combination  was then advanced through the balloon guide into the ICA. System was advanced into the internal carotid artery, to the level of the occlusion. The micro wire was then carefully advanced through the occluded segment, with a distal knuckle configuration. Microcatheter was then pushed through the occluded segment and the wire was removed. Intermediate catheter was advanced over the micro wire to the carotid siphon, which would not advance beyond the ophthalmic segment through the terminus. Blood was then aspirated through the hub of the microcatheter, and a gentle contrast injection was performed confirming intraluminal position. A rotating hemostatic valve was then attached to the back end of the microcatheter, and a pressurized and heparinized saline bag was attached to the catheter. 4 mm x 40 mm solitaire device was then selected. Back flush was achieved at the rotating hemostatic valve, and then the device was gently advanced through the microcatheter to the distal end. The retriever was then unsheathed by withdrawing the microcatheter under fluoroscopy. Once the retriever was completely unsheathed, the microcatheter was carefully stripped from the delivery wire of the device. Control angiogram was then performed from the balloon catheter. 3 minute time interval was observed. The balloon on the balloon guide was then inflated to profile of the vessel. Constant aspiration was then performed through the intermediate catheter, and constant gentle aspiration was performed at the balloon guide. This aspiration was continued as the retriever was gently and slowly withdrawn with fluoroscopic observation into the distal intermediate catheter. The entire system was then gently withdrawn from the intracranial ICA and into the balloon guide. Once the retriever was entirely removed from the system, free aspiration was confirmed at the hub of the balloon guide, with free blood return confirmed. Control angiogram was then  performed. TICI 2a flow was achieved. Repeat angiogram with multiple angulation demonstrated persistent occlusion of parietal branch. The same  coaxial system was again passed through the balloon guide catheter. The microcatheter system was then advanced through the occlusion of the parietal branch. Once the micro wire microcatheter were beyond the occlusion, the micro wire was removed and stentreiver device was deployed, stripping the microcatheter from the wire. After the device was deployed across the occlusion, the intermediate catheter was again attempted to advanced to the M1 segment. This was unsuccessful, and would not advance beyond the ophthalmic segment. The balloon at the balloon guide was inflated to profile of the vessel. Local aspiration was performed at the intermediate catheter upon withdrawal of the device under fluoroscopic observation, with aspiration at the balloon guide. Once the retrieve her and intermediate catheter were entirely removed from the system, free aspiration was confirmed at the hub of the intermediate catheter, with free blood return confirmed. Control angiogram was again performed. Improved flow was confirmed, TICI 2b. Balloon guide was withdrawn and angiogram of the cervical segment was performed. We then removed the balloon guide catheter and deployed an 8 French Angio-Seal at the right common femoral artery access. Angiogram was performed of the right sided access and the left-sided access before completing the case. Patient tolerated the procedure well and remained hemodynamically stable throughout. No complications were encountered. Estimated blood loss approximately 50 cc. IMPRESSION: Status post ultrasound guided access right common femoral artery for cerebral angiogram and a combination of aspiration and mechanical thrombectomy of left M1 emergent large vessel occlusion, and restoration of TICI 2b flow after 2 passes. Status post balloon angioplasty to 6 mm of proximal  right common iliac artery for treatment of a restrictive lesion in order to pass 8 French sheath to perform the thrombectomy. Status post deployment of Angio-Seal at the right common femoral artery. At the conclusion of the case, a 4 [place] sheath remains in left common femoral artery for arterial monitoring. Signed, Dulcy Fanny. Dellia Nims, RPVI [facility] Interventional Radiology Specialists [facility] Radiology PLAN: Formal CT after the case Right hip straight overnight status post 8 French device and Angio-Seal closure Left common femoral artery for French sheath may be removed when hemodynamic monitoring is no longer needed with manual pressure. Goal blood pressure 606 [id number] systolic given the incomplete flow restoration Electronically Signed   By: Corrie Mckusick D.O.   On: 03/11/2018 20:39   Ir US Guide Vasc Access Right  Result Date: 03/09/2018 INDICATION: 73 year old female with a history of acute left MCA stroke. She presents within time frame for IV tPA, and is a candidate for mechanical thrombectomy EXAM: ULTRASOUND GUIDED ACCESS RIGHT COMMON FEMORAL ARTERY ULTRASOUND GUIDED ACCESS LEFT COMMON FEMORAL ARTERY FOR ARTERIAL MONITORING ANGIOGRAM RIGHT LOWER EXTREMITY BALLOON ANGIOPLASTY STENOSIS RIGHT COMMON ILIAC ARTERY IN ORDER TO PASS 8 FRENCH SHEATH TO COMPLETE THE THROMBECTOMY CEREBRAL ANGIOGRAM MECHANICAL THROMBECTOMY LEFT MCA EMERGENT LARGE VESSEL OCCLUSION DEPLOYMENT OF ANGIO-SEAL RIGHT COMMON FEMORAL ARTERY COMPARISON:  CT imaging of the same day MEDICATIONS: 2 g Ancef IV. The antibiotic was administered within 1 hour of the procedure ANESTHESIA/SEDATION: General endotracheal tube anesthesia CONTRAST:  96 cc Isovue-300 FLUOROSCOPY TIME:  Fluoroscopy Time: 26 minutes 24 seconds (1,362 mGy). COMPLICATIONS: None TECHNIQUE: Informed written consent was obtained from the patient's family after a thorough discussion of the procedural risks, benefits and alternatives. Specific risks discussed  include: Bleeding, infection, contrast reaction, kidney injury/failure, need for further procedure/surgery, arterial injury or dissection, embolization to new territory, intracranial hemorrhage (10-15% risk), neurologic deterioration, cardiopulmonary collapse, death. All questions were addressed. Maximal Sterile Barrier Technique was utilized  including during the procedure including caps, mask, sterile gowns, sterile gloves, sterile drape, hand hygiene and skin antiseptic. A timeout was performed prior to the initiation of the procedure. The anesthesia team was present to provide general endotracheal tube anesthesia and for patient monitoring during the procedure. Interventional neuro radiology nursing staff was also present. 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 Surgical changes of prior endarterectomy evident on prior CT imaging. Left external carotid artery: Patent with antegrade flow. Left internal carotid artery: Normal course caliber and contour of the cervical portion. Vertical and petrous segment patent with normal course caliber contour. Cavernous segment patent. Clinoid segment patent. Antegrade flow of the ophthalmic artery. Ophthalmic segment patent. Terminus patent. Left MCA: Distal M1 occluded at the initiation of the case. There is partial filling of an inferior branch which is the non dominant branch to the frontal. Patent fetal PCA to the left P2 segment. Left ACA: A 1 segment patent. A 2 segment perfuses the right territory. Completion Findings: Left MCA: After 2 passes of mechanical thrombectomy plus aspiration, there is near complete restoration of flow through the left MCA territory, with slow flow through an M3/M4 branch of the parietal region compatible with TI CI 2b flow. 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 Physical exam At  the initiation of the case, palpable bilateral common femoral artery pulses present No palpable distal pulses Doppler positive pulses of the right posterior tibial and anterior tibial (with systolic blood pressures above 200) Doppler positive pulse at the left posterior tibial (with systolic blood pressure above 200), with no Doppler signal at the left anterior tibial At completion of case Doppler positive pulses were only discovered on the right posterior tibial and anterior tibial arteries with blood pressures above 200. In the 140-160 range (goal) Doppler signal was not evident A completion of case Doppler positive pulses were only discovered on the left at the posterior tibial and not the left anterior tibial (with the pressure above 258 systolic). PROCEDURE: Patient is brought emergently to the neuro angiography suite, with the patient identified appropriately and placed supine position on the table. Left radial arterial line was attempted by the anesthesia team. The patient is then prepped and draped in the usual sterile fashion. Ultrasound survey of the right inguinal region was performed with images stored and sent to PACs. 11 blade scalpel was used to make a small incision. Blunt dissection was performed. A micropuncture needle was used access the right common femoral artery under ultrasound. With excellent arterial blood flow returned, an .018 micro wire was passed through the needle, observed to enter the abdominal aorta under fluoroscopy. The needle was removed, and a micropuncture sheath was placed over the wire. The inner dilator and wire were removed, and an 035 Bentson wire was advanced under fluoroscopy into the abdominal aorta. The sheath was removed and a standard 5 [place] vascular sheath was placed. The dilator was removed and the sheath was flushed. Ultrasound survey of the left inguinal region was then performed with images stored and sent to PACs, confirming patency of the vessel. A  micropuncture needle was used access the left common femoral artery under ultrasound. With excellent arterial blood flow returned, and an .018 micro wire was passed through the needle, observed enter the abdominal aorta under fluoroscopy. The needle was removed, and a micropuncture sheath was placed over the wire. The inner dilator and  wire were removed, and an 035 Bentson wire was advanced under fluoroscopy into the abdominal aorta. The sheath was removed and a standard 4 [place] vascular sheath was placed for arterial monitoring. The dilator was removed and the sheath was flushed. A 79F JB-1 diagnostic catheter was then advanced over the wire through the existing right femoral access to the proximal descending thoracic aorta. Wire was then removed. Double flush of the catheter was performed. Catheter was then used to select the left cervical internal carotid artery. Formal angiogram was performed, with roadmap achieved. Exchange length Rosen wire was then passed through the diagnostic catheter to the distal cervical ICA and the diagnostic catheter was removed. The 5 French sheath was removed, with attempt at placing 8 French 55 cm bright tip sheath. Significant resistance was encountered with passing the sheath through the right iliac system. The sheath would not advanced through the pre-existing right common iliac artery stent. Sheath was withdrawn and rotated. We attempted to advance the sheath over the introducer which was pinned on the wire. We also removed the introducer in place to diagnostic catheter into the aorta and attempted to pass the sheath over the diagnostic catheter. None of these maneuvers were successful. Sheath was then withdrawn into the external iliac artery. Balloon angioplasty was performed of the pre-existing right common iliac artery stent with 5 mm x 40 mm standard balloon angioplasty. Balloon was removed and the introducer was then replaced. Eight French sheath was then again attempted to  pass over the wire, unsuccessful through this segment of the iliac artery. The introducer was removed, diagnostic catheter was placed into the cervical segment of the internal carotid artery, and the Rose an wire was exchanged for a 260 cm Amplatz wire. With the stiff wire in place, another attempt was made to pass the sheath which was unsuccessful. Sheath was then again withdrawn to the external iliac artery, introducer was removed, and a second balloon angioplasty 6 mm x 40 mm was performed at the pre-existing stent. The sheath was then successfully advanced over the balloon as the balloon was deflated, into the abdominal aorta. The introducer was again placed after removing the balloon and the sheath was advanced 6 successfully into the aorta. Introducer was withdrawn. Once the sheath was advanced over the wire to the thoracic aorta, sheath was flushed and attached to pressurized and heparinized saline bag for constant forward flow. Eight French 95 cm flow gate balloon catheter was then advanced over the wire to the distal cervical segment. Wire was removed. Then a coaxial intermediate catheter and microcatheter combination was prepared on the back table. This combination was penumbra Ace 64 catheter and a Trevo Provue18 microcatheter, with a synchro soft wire. This combination was then advanced through the balloon guide into the ICA. System was advanced into the internal carotid artery, to the level of the occlusion. The micro wire was then carefully advanced through the occluded segment, with a distal knuckle configuration. Microcatheter was then pushed through the occluded segment and the wire was removed. Intermediate catheter was advanced over the micro wire to the carotid siphon, which would not advance beyond the ophthalmic segment through the terminus. Blood was then aspirated through the hub of the microcatheter, and a gentle contrast injection was performed confirming intraluminal position. A rotating  hemostatic valve was then attached to the back end of the microcatheter, and a pressurized and heparinized saline bag was attached to the catheter. 4 mm x 40 mm solitaire device was then selected. Back  flush was achieved at the rotating hemostatic valve, and then the device was gently advanced through the microcatheter to the distal end. The retriever was then unsheathed by withdrawing the microcatheter under fluoroscopy. Once the retriever was completely unsheathed, the microcatheter was carefully stripped from the delivery wire of the device. Control angiogram was then performed from the balloon catheter. 3 minute time interval was observed. The balloon on the balloon guide was then inflated to profile of the vessel. Constant aspiration was then performed through the intermediate catheter, and constant gentle aspiration was performed at the balloon guide. This aspiration was continued as the retriever was gently and slowly withdrawn with fluoroscopic observation into the distal intermediate catheter. The entire system was then gently withdrawn from the intracranial ICA and into the balloon guide. Once the retriever was entirely removed from the system, free aspiration was confirmed at the hub of the balloon guide, with free blood return confirmed. Control angiogram was then performed. TICI 2a flow was achieved. Repeat angiogram with multiple angulation demonstrated persistent occlusion of parietal branch. The same coaxial system was again passed through the balloon guide catheter. The microcatheter system was then advanced through the occlusion of the parietal branch. Once the micro wire microcatheter were beyond the occlusion, the micro wire was removed and stentreiver device was deployed, stripping the microcatheter from the wire. After the device was deployed across the occlusion, the intermediate catheter was again attempted to advanced to the M1 segment. This was unsuccessful, and would not advance beyond the  ophthalmic segment. The balloon at the balloon guide was inflated to profile of the vessel. Local aspiration was performed at the intermediate catheter upon withdrawal of the device under fluoroscopic observation, with aspiration at the balloon guide. Once the retrieve her and intermediate catheter were entirely removed from the system, free aspiration was confirmed at the hub of the intermediate catheter, with free blood return confirmed. Control angiogram was again performed. Improved flow was confirmed, TICI 2b. Balloon guide was withdrawn and angiogram of the cervical segment was performed. We then removed the balloon guide catheter and deployed an 8 French Angio-Seal at the right common femoral artery access. Angiogram was performed of the right sided access and the left-sided access before completing the case. Patient tolerated the procedure well and remained hemodynamically stable throughout. No complications were encountered. Estimated blood loss approximately 50 cc. IMPRESSION: Status post ultrasound guided access right common femoral artery for cerebral angiogram and a combination of aspiration and mechanical thrombectomy of left M1 emergent large vessel occlusion, and restoration of TICI 2b flow after 2 passes. Status post balloon angioplasty to 6 mm of proximal right common iliac artery for treatment of a restrictive lesion in order to pass 8 French sheath to perform the thrombectomy. Status post deployment of Angio-Seal at the right common femoral artery. At the conclusion of the case, a 4 [place] sheath remains in left common femoral artery for arterial monitoring. Signed, Dulcy Fanny. Dellia Nims, RPVI Vascular and Interventional Radiology Specialists [facility] Radiology PLAN: Formal CT after the case Right hip straight overnight status post 8 French device and Angio-Seal closure Left common femoral artery for French sheath may be removed when hemodynamic monitoring is no longer needed with manual  pressure. Goal blood pressure 277 [id number] systolic given the incomplete flow restoration Electronically Signed   By: Corrie Mckusick D.O.   On: 03/11/2018 20:39   Ir Percutaneous Art Thrombectomy/infusion Intracranial Inc Diag Angio  Result Date: 03/22/2018 INDICATION: 73 year old female with a history  of acute left MCA stroke. She presents within time frame for IV tPA, and is a candidate for mechanical thrombectomy EXAM: ULTRASOUND GUIDED ACCESS RIGHT COMMON FEMORAL ARTERY ULTRASOUND GUIDED ACCESS LEFT COMMON FEMORAL ARTERY FOR ARTERIAL MONITORING ANGIOGRAM RIGHT LOWER EXTREMITY BALLOON ANGIOPLASTY STENOSIS RIGHT COMMON ILIAC ARTERY IN ORDER TO PASS 8 FRENCH SHEATH TO COMPLETE THE THROMBECTOMY CEREBRAL ANGIOGRAM MECHANICAL THROMBECTOMY LEFT MCA EMERGENT LARGE VESSEL OCCLUSION DEPLOYMENT OF ANGIO-SEAL RIGHT COMMON FEMORAL ARTERY COMPARISON:  CT imaging of the same day MEDICATIONS: 2 g Ancef IV. The antibiotic was administered within 1 hour of the procedure ANESTHESIA/SEDATION: General endotracheal tube anesthesia CONTRAST:  96 cc Isovue-300 FLUOROSCOPY TIME:  Fluoroscopy Time: 26 minutes 24 seconds (1,362 mGy). COMPLICATIONS: None TECHNIQUE: Informed written consent was obtained from the patient's family after a thorough discussion of the procedural risks, benefits and alternatives. Specific risks discussed include: Bleeding, infection, contrast reaction, kidney injury/failure, need for further procedure/surgery, arterial injury or dissection, embolization to new territory, intracranial hemorrhage (10-15% risk), neurologic deterioration, cardiopulmonary collapse, death. All questions were addressed. Maximal Sterile Barrier Technique was utilized including during the procedure including caps, mask, sterile gowns, sterile gloves, sterile drape, hand hygiene and skin antiseptic. A timeout was performed prior to the initiation of the procedure. The anesthesia team was present to provide general endotracheal tube  anesthesia and for patient monitoring during the procedure. Interventional neuro radiology nursing staff was also present. 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 Surgical changes of prior endarterectomy evident on prior CT imaging. Left external carotid artery: Patent with antegrade flow. Left internal carotid artery: Normal course caliber and contour of the cervical portion. Vertical and petrous segment patent with normal course caliber contour. Cavernous segment patent. Clinoid segment patent. Antegrade flow of the ophthalmic artery. Ophthalmic segment patent. Terminus patent. Left MCA: Distal M1 occluded at the initiation of the case. There is partial filling of an inferior branch which is the non dominant branch to the frontal. Patent fetal PCA to the left P2 segment. Left ACA: A 1 segment patent. A 2 segment perfuses the right territory. Completion Findings: Left MCA: After 2 passes of mechanical thrombectomy plus aspiration, there is near complete restoration of flow through the left MCA territory, with slow flow through an M3/M4 branch of the parietal region compatible with TI CI 2b flow. 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 Physical exam At the initiation of the case, palpable bilateral common femoral artery pulses present No palpable distal pulses Doppler positive pulses of the right posterior tibial and anterior tibial (with systolic blood pressures above 200) Doppler positive pulse at the left posterior tibial (with systolic blood pressure above 200), with no Doppler signal at the left anterior tibial At completion of case Doppler positive pulses were only discovered on the right posterior tibial and anterior tibial arteries with blood pressures above 200. In the 140-160 range (goal) Doppler signal was not evident A completion of case  Doppler positive pulses were only discovered on the left at the posterior tibial and not the left anterior tibial (with the pressure above 382 systolic). PROCEDURE: Patient is brought emergently to the neuro angiography suite, with the patient identified appropriately and placed supine position on the table. Left radial arterial line was attempted by the anesthesia team. The patient is then prepped and draped in the usual sterile fashion. Ultrasound survey of the right inguinal region was performed with images  stored and sent to PACs. 11 blade scalpel was used to make a small incision. Blunt dissection was performed. A micropuncture needle was used access the right common femoral artery under ultrasound. With excellent arterial blood flow returned, an .018 micro wire was passed through the needle, observed to enter the abdominal aorta under fluoroscopy. The needle was removed, and a micropuncture sheath was placed over the wire. The inner dilator and wire were removed, and an 035 Bentson wire was advanced under fluoroscopy into the abdominal aorta. The sheath was removed and a standard 5 [place] vascular sheath was placed. The dilator was removed and the sheath was flushed. Ultrasound survey of the left inguinal region was then performed with images stored and sent to PACs, confirming patency of the vessel. A micropuncture needle was used access the left common femoral artery under ultrasound. With excellent arterial blood flow returned, and an .018 micro wire was passed through the needle, observed enter the abdominal aorta under fluoroscopy. The needle was removed, and a micropuncture sheath was placed over the wire. The inner dilator and wire were removed, and an 035 Bentson wire was advanced under fluoroscopy into the abdominal aorta. The sheath was removed and a standard 4 [place] vascular sheath was placed for arterial monitoring. The dilator was removed and the sheath was flushed. A 51F JB-1 diagnostic catheter  was then advanced over the wire through the existing right femoral access to the proximal descending thoracic aorta. Wire was then removed. Double flush of the catheter was performed. Catheter was then used to select the left cervical internal carotid artery. Formal angiogram was performed, with roadmap achieved. Exchange length Rosen wire was then passed through the diagnostic catheter to the distal cervical ICA and the diagnostic catheter was removed. The 5 French sheath was removed, with attempt at placing 8 French 55 cm bright tip sheath. Significant resistance was encountered with passing the sheath through the right iliac system. The sheath would not advanced through the pre-existing right common iliac artery stent. Sheath was withdrawn and rotated. We attempted to advance the sheath over the introducer which was pinned on the wire. We also removed the introducer in place to diagnostic catheter into the aorta and attempted to pass the sheath over the diagnostic catheter. None of these maneuvers were successful. Sheath was then withdrawn into the external iliac artery. Balloon angioplasty was performed of the pre-existing right common iliac artery stent with 5 mm x 40 mm standard balloon angioplasty. Balloon was removed and the introducer was then replaced. Eight French sheath was then again attempted to pass over the wire, unsuccessful through this segment of the iliac artery. The introducer was removed, diagnostic catheter was placed into the cervical segment of the internal carotid artery, and the Rose an wire was exchanged for a 260 cm Amplatz wire. With the stiff wire in place, another attempt was made to pass the sheath which was unsuccessful. Sheath was then again withdrawn to the external iliac artery, introducer was removed, and a second balloon angioplasty 6 mm x 40 mm was performed at the pre-existing stent. The sheath was then successfully advanced over the balloon as the balloon was deflated, into  the abdominal aorta. The introducer was again placed after removing the balloon and the sheath was advanced 6 successfully into the aorta. Introducer was withdrawn. Once the sheath was advanced over the wire to the thoracic aorta, sheath was flushed and attached to pressurized and heparinized saline bag for constant forward flow. Eight [place] 95  cm flow gate balloon catheter was then advanced over the wire to the distal cervical segment. Wire was removed. Then a coaxial intermediate catheter and microcatheter combination was prepared on the back table. This combination was penumbra Ace 64 catheter and a Trevo Provue18 microcatheter, with a synchro soft wire. This combination was then advanced through the balloon guide into the ICA. System was advanced into the internal carotid artery, to the level of the occlusion. The micro wire was then carefully advanced through the occluded segment, with a distal knuckle configuration. Microcatheter was then pushed through the occluded segment and the wire was removed. Intermediate catheter was advanced over the micro wire to the carotid siphon, which would not advance beyond the ophthalmic segment through the terminus. Blood was then aspirated through the hub of the microcatheter, and a gentle contrast injection was performed confirming intraluminal position. A rotating hemostatic valve was then attached to the back end of the microcatheter, and a pressurized and heparinized saline bag was attached to the catheter. 4 mm x 40 mm solitaire device was then selected. Back flush was achieved at the rotating hemostatic valve, and then the device was gently advanced through the microcatheter to the distal end. The retriever was then unsheathed by withdrawing the microcatheter under fluoroscopy. Once the retriever was completely unsheathed, the microcatheter was carefully stripped from the delivery wire of the device. Control angiogram was then performed from the balloon catheter. 3  minute time interval was observed. The balloon on the balloon guide was then inflated to profile of the vessel. Constant aspiration was then performed through the intermediate catheter, and constant gentle aspiration was performed at the balloon guide. This aspiration was continued as the retriever was gently and slowly withdrawn with fluoroscopic observation into the distal intermediate catheter. The entire system was then gently withdrawn from the intracranial ICA and into the balloon guide. Once the retriever was entirely removed from the system, free aspiration was confirmed at the hub of the balloon guide, with free blood return confirmed. Control angiogram was then performed. TICI 2a flow was achieved. Repeat angiogram with multiple angulation demonstrated persistent occlusion of parietal branch. The same coaxial system was again passed through the balloon guide catheter. The microcatheter system was then advanced through the occlusion of the parietal branch. Once the micro wire microcatheter were beyond the occlusion, the micro wire was removed and stentreiver device was deployed, stripping the microcatheter from the wire. After the device was deployed across the occlusion, the intermediate catheter was again attempted to advanced to the M1 segment. This was unsuccessful, and would not advance beyond the ophthalmic segment. The balloon at the balloon guide was inflated to profile of the vessel. Local aspiration was performed at the intermediate catheter upon withdrawal of the device under fluoroscopic observation, with aspiration at the balloon guide. Once the retrieve her and intermediate catheter were entirely removed from the system, free aspiration was confirmed at the hub of the intermediate catheter, with free blood return confirmed. Control angiogram was again performed. Improved flow was confirmed, TICI 2b. Balloon guide was withdrawn and angiogram of the cervical segment was performed. We then  removed the balloon guide catheter and deployed an 8 French Angio-Seal at the right common femoral artery access. Angiogram was performed of the right sided access and the left-sided access before completing the case. Patient tolerated the procedure well and remained hemodynamically stable throughout. No complications were encountered. Estimated blood loss approximately 50 cc. IMPRESSION: Status post ultrasound guided access right common  femoral artery for cerebral angiogram and a combination of aspiration and mechanical thrombectomy of left M1 emergent large vessel occlusion, and restoration of TICI 2b flow after 2 passes. Status post balloon angioplasty to 6 mm of proximal right common iliac artery for treatment of a restrictive lesion in order to pass 8 French sheath to perform the thrombectomy. Status post deployment of Angio-Seal at the right common femoral artery. At the conclusion of the case, a 4 [place] sheath remains in left common femoral artery for arterial monitoring. Signed, Dulcy Fanny. Dellia Nims, RPVI [facility] Radiology PLAN: Formal CT after the case Right hip straight overnight status post 8 French device and Angio-Seal closure Left common femoral artery for French sheath may be removed when hemodynamic monitoring is no longer needed with manual pressure. Goal blood pressure 010 [id number] systolic given the incomplete flow restoration Electronically Signed   By: Corrie Mckusick D.O.   On: 04/06/2018 20:39   Ct Head Code Stroke Wo Contrast  Result Date: 04/04/2018 CLINICAL DATA:  Code stroke.  Deficit not specified EXAM: CT HEAD WITHOUT CONTRAST TECHNIQUE: Contiguous axial images were obtained from the base of the skull through the vertex without intravenous contrast. COMPARISON:  Head CT 07/18/2016 and brain MRI 07/20/2016 FINDINGS: Brain: Multiple remote lacunar infarcts in the deep white matter and deep gray nuclei, chronic when compared to prior  head CT and brain MRI. Small remote left cerebellar infarct. No evidence of acute infarct. No hemorrhage or hydrocephalus. Vascular: No hyperdense vessel. Skull: Normal. Negative for fracture or focal lesion. Sinuses/Orbits: No acute finding. Other: These results were communicated to Dr. Rory Percy at 4:43 pmon 01/25/2020by text page via the [facility] messaging system. ASPECTS [facility] Stroke Program Early CT Score) Not scored without localizing symptoms. Motion artifact at the vertex blurring cortex. IMPRESSION: 1. No acute finding. 2. Motion artifact at the vertex. 3. Multiple remote small vessel infarcts. Electronically Signed   By: Monte Fantasia M.D.   On: 03/26/2018 16:45   Cardiac studies: EKG 04/03/2018: Sinus or ectopic atrial tachycardia. Left atrial enlargement. IVCD. Nonspecific lateral ST-T changes.  Other than rate increase, no changes compated to previous EKG in 05/2017.  Hospital echocardiogram 03/20/2018: - Left ventricle: The cavity size was mildly dilated. There was   severe concentric hypertrophy. Systolic function was moderately   to severely reduced. The estimated ejection fraction was in the   range of 30% to 35%. Mod global and severe inferior/inferolateral   hypokinesis. Diastolic function assessment limited due to severe   MAC. - Aortic valve: Moderately calcified annulus. Trileaflet;   moderately calcified leaflets. Echodense projection seen off   right aortic cusp on aortic side, not independently mobile. Seen   partly on previous study in 05/2017. This most likely represents   calcification or healed vegetation. There was trace stenosis. - Mitral valve: Severely thickened annulus. Severely calcified   leaflets . Inadequate Doppler assessment to evaluate for mitral   stenosis. Visually, appears to be mild calcific MS. - Left atrium: The atrium was severely dilated. - Pericardium, extracardiac: Trace posterior pericardial effusion. - Compared to previous study in 05/2017,  LVEF is mildly reduced.   Valvular calcifications and atheromatous calcifications in aortic   root more prominently seen.  Review of Systems  Unable to perform ROS: Intubated   Blood pressure (!) 83/60 on Lt cuff. Arterial line Lt CFA BP 150/50 mmHg, pulse (!) 101, temperature (!) 96.5 F (35.8 C), temperature source Axillary, resp. rate 18, weight 77.7  kg, SpO2 100 %. Physical Exam  Nursing note and vitals reviewed. Constitutional: She appears well-developed and well-nourished.  Intubated and sedated  HENT:  Head: Normocephalic and atraumatic.  Eyes: Pupils are equal, round, and reactive to light.  Neck: No JVD present.  Cardiovascular: S1 normal and S2 normal.  Murmur heard.  Crescendo systolic murmur is present with a grade of 2/6.  Diastolic (Mitral area) murmur is present with a grade of 1/6. Pulses:      Carotid pulses are on the right side with bruit and on the left side with bruit.      Femoral pulses are 1+ on the right side and 1+ on the left side.      Popliteal pulses are 0 on the right side and 0 on the left side.       Dorsalis pedis pulses are 0 on the right side and 0 on the left side.       Posterior tibial pulses are 0 on the right side and 0 on the left side.  Lymphadenopathy:    She has no cervical adenopathy.    Assessment/Recommednations:  73 year old [name] with severe atherosclerotic cardiovascular disease with medically managed diffuse CAD, severe PAD including prior embolic CVA, b/l renal artery stenting, Rt CIA stenting, h/o left carotid endarterctomy, b/l subclavian artery stenosis, Jehovah's witness, type 2 DM, hypertension, now with left acute left MCA CVA, treated with tPA and mechanical thrombectomy. Cardiology consulted for troponin elevation.  Troponin elevation: Peak point of care trop 5.7 ng/mL NSTEMI or type 2 MI in an extremely complex patient with extensive atherosclerotic disease admitted with CVA.  EKG with no acute  ischemic findings. No STEMI. Hemodynamically stable. Echocardiogram with EF 30-35%. She is not a candidate for DAPT or anticoagulation given recent use of tPA, and needs permissive hypertension, as per Neurology. Management of ACS is limited to high intensity statin use. BP measurement is tricky due to b/l subclavian artery stenoses, as well monophasic waveforms likely with wide pulse pressure in the setting of aorta stiffness. Consider avoiding MAP below 70 mmHg in left femoral artery arterial line.  Given patient's extensive aortic atherosclerotic and recent CVA, her risk of CVA from cardiac catheterization is high. I do not think the risks of invasive coronary angiography outweigh the benefits, even when patient would be a candidate for anticoagulation and DAPT.   Overall long term prognosis from cardiovascular standpoint is guarded.  Recommend DAPT with aspirin and plavix, and IV heparin for 48-72 hrs, when deemed safe by Neurology. Added Lipitor 80 mg daily.   HFrEF: EF mildly reduced compared to baseline. Will consider heart failure medical therapy when permissive hypertension period is over.   CVA: Extensive atherosclerosis with multiple possible sources for embolic CVA, including aortic calcification, carotid disease. Management as per neurology.   AKI/CKD 3: Management per the primary team.  I will discuss the recommendations with Dr. Cheral Marker.  No family present at bedside at this time.  CRITICAL CARE Performed by: Nigel Mormon   Total critical care time: 90 minutes  Critical care time was exclusive of separately billable procedures and treating other patients.  Critical care was necessary to treat or prevent imminent or life-threatening deterioration.  Patient is intubated and sedated. No family members available at this time.  Critical care was time spent personally by me on the following activities: development of treatment plan with nursing, discussions with  consultants, evaluation of patient's response to treatment, examination of patient, obtaining history chart review,  ordering and performing treatments and interventions, ordering and review of laboratory studies, ordering and review of radiographic studies, pulse oximetry and re-evaluation of patient's condition.   Tinisha Etzkorn J Marley Charlot 03/14/2018, 9:27 PM   [place], MD [facility] Cardiovascular. [place] Pager: [phone] Office: [phone] If no answer Cell [phone]

## 2018-03-08 NOTE — Progress Notes (Signed)
eLink Physician-Brief Progress Note Patient Name: Yvonne Patel DOB: Jun 27, 1945 MRN: [id number]   Date of Service  03/20/2018  HPI/Events of Note  Patient c/o chest pain in OR. Nitroglycerin started by anesthesia. Troponin = 4.62. Request for order for Nitroglycerin IV infusion. 12 Lead EKG: Sinus tachycardia. Non-specific intra-ventricular conduction delay. Nonspecific T wave abnormality  eICU Interventions  Will order: 1. Nitroglycerin IV infusion for CAD and control of HTN.  2. Wean Cleviprex as tolerated.      Intervention Category Major Interventions: Hypertension - evaluation and management;Other:  Lysle Dingwall 03/23/2018, 9:53 PM

## 2018-03-08 NOTE — H&P (Signed)
Neurology Consultation  Reason for Consult: Code stroke Referring Physician: Emergency department physician  CC: Right facial droop, right sided flaccidity, mute  History is obtained from: EMS  HPI: Yvonne Patel is a 73 y.o. female with history of stroke, diabetes, syncope, PAD, hypertension, hyperlipidemia, CEA in the past, cardiomyopathy and neuropathy.  Patient was apparently at [facility] today at 3:30 PM when she suddenly was noted to be standing there with a right facial droop, not able to move her right arm, and was not speaking.  Ambulance was called and patient was immediately brought to [facility] as a code stroke.  Patient has had his pontine stroke in the past with other shower emboli in different vascular distributions.  Currently she is on aspirin and Plavix but no anticoagulants.  I did call the daughter.  Daughter does live with patient and states that the patient may have some mild cognitive problems other than that she is able to take care of her self.  Discussing with the daughter the patient is likely going to be a TPA candidate she gave consent to give TPA.  Patient will be receiving CT, CTA of head and neck to look for large vessel occlusion.  LKW: 5631 on 04/02/2018 tpa given?: yes Premorbid modified Rankin scale (mRS): 0 NIH stroke scale 22  ROS:  Unable to obtain due to altered mental status.   Past Medical History:  Diagnosis Date  . Cardiomyopathy ([place])   . Coronary artery disease   . Hyperlipidemia   . Hypertension   . Neuromuscular disorder (HCC)    neuropathy  . PAD (peripheral artery disease) ([place])   . Peripheral vascular disease ([place])   . Refusal of blood transfusions as patient is Jehovah's Witness   . Retinal disease   . Syncope   . TIA (transient ischemic attack) 2010; 09/2011   denies residual on 03/11/2015  . Type II diabetes mellitus (HCC)      Family History  Problem Relation Age of Onset  . Breast cancer Neg Hx    Social History:   reports that she quit smoking about 42 years ago. Her smoking use included cigarettes. She started smoking about 52 years ago. She has a 15.00 pack-year smoking history. She has never used smokeless tobacco. She reports current alcohol use. She reports that she does not use drugs.  Medications No current facility-administered medications for this encounter.   Current Outpatient Medications:  .  aspirin EC 81 MG tablet, Take 81 mg by mouth at bedtime. , Disp: , Rfl:  .  atorvastatin (LIPITOR) 80 MG tablet, Take 80 mg by mouth daily at 6 PM. , Disp: , Rfl: 3 .  carvedilol (COREG) 3.125 MG tablet, Take 1 tablet (3.125 mg total) by mouth 2 (two) times daily., Disp: 60 tablet, Rfl: 0 .  clopidogrel (PLAVIX) 75 MG tablet, Take 75 mg by mouth at bedtime. , Disp: , Rfl:  .  Ferrous Fumarate (HEMOCYTE - 106 MG FE) 324 (106 Fe) MG TABS tablet, Take 1 tablet (106 mg of iron total) by mouth 2 (two) times daily., Disp: 60 tablet, Rfl: 1 .  furosemide (LASIX) 20 MG tablet, Take 1 tablet (20 mg total) by mouth daily as needed. For weight gain of 3 lbs in 1 day or 5Lbs in 2 days., Disp: 30 tablet, Rfl: 0 .  insulin degludec (TRESIBA FLEXTOUCH) 100 UNIT/ML SOPN FlexTouch Pen, Inject 15 Units into the skin every morning. , Disp: , Rfl:  .  metFORMIN (GLUCOPHAGE)  500 MG tablet, Take 2 tablets (1,000 mg total) by mouth 2 (two) times daily., Disp: , Rfl:    Exam: Current vital signs: Wt 77.7 kg   BMI 28.51 kg/m  Vital signs in last 24 hours: Weight:  [77.7 kg] 77.7 kg (01/01 1600)  Physical Exam  Constitutional: Appears well-developed and well-nourished.  Psych: Affect appropriate to situation Eyes: No scleral injection HENT: No OP obstrucion Head: Normocephalic.  Cardiovascular: Normal rate and regular rhythm.  Respiratory: Effort normal, non-labored breathing GI: Soft.  No distension. There is no tenderness.  Skin: WDI  Neuro: Mental Status: Patient is awake, she follows some commands, she is  nonverbal but the command she does follow are visual. Cranial Nerves: II: Does not blink to threat on the right .  III,IV, VI: EOMI without ptosis or diploplia. Pupils are equal, round, and reactive to light.   VII: Facial movement is symmetric to the right lower facial droop. VIII: Attempts to look toward voice when called on either side  Motor: Able to hold her left arm and leg antigravity.  Patient is flaccid on the right arm but able to hold right leg antigravity.  Sensory: Continues to pain in all 4 extremities Deep Tendon Reflexes: 2+ and symmetric in the biceps and patellae.  Plantars: Mute on right and down on left  Labs I have reviewed labs in epic and the results pertinent to this consultation are:   CBC    Component Value Date/Time   WBC 5.7 06/03/2017 0734   RBC 3.53 (L) 06/03/2017 0734   HGB 10.8 (L) 06/03/2017 0734   HCT 33.2 (L) 06/03/2017 0734   HCT 22.4 (L) 07/21/2016 1056   PLT 139 (L) 06/03/2017 0734   MCV 94.1 06/03/2017 0734   MCH 30.6 06/03/2017 0734   MCHC 32.5 06/03/2017 0734   RDW 13.1 06/03/2017 0734   LYMPHSABS 1.5 07/18/2016 0024   MONOABS 0.5 07/18/2016 0024   EOSABS 0.1 07/18/2016 0024   BASOSABS 0.0 07/18/2016 0024    CMP     Component Value Date/Time   NA 139 06/03/2017 0734   K 3.8 06/03/2017 0734   CL 108 06/03/2017 0734   CO2 24 06/03/2017 0734   GLUCOSE 142 (H) 06/03/2017 0734   BUN 28 (H) 06/03/2017 0734   CREATININE 1.59 (H) 06/03/2017 0734   CALCIUM 8.8 (L) 06/03/2017 0734   PROT 5.9 (L) 07/24/2016 0519   ALBUMIN 2.2 (L) 07/24/2016 0519   AST 31 07/24/2016 0519   ALT 65 (H) 07/24/2016 0519   ALKPHOS 126 07/24/2016 0519   BILITOT 0.6 07/24/2016 0519   GFRNONAA 31 (L) 06/03/2017 0734   GFRAA 36 (L) 06/03/2017 0734    Lipid Panel     Component Value Date/Time   CHOL 181 10/01/2011 0436   TRIG 120 10/01/2011 0436   HDL 42 10/01/2011 0436   CHOLHDL 4.3 10/01/2011 0436   VLDL 24 10/01/2011 0436   LDLCALC 115 (H)  10/01/2011 0436     Imaging I have reviewed the images obtained:  CT-scan of the brain--no acute findings with multiple remote small vessel infarcts.   CTA haed shows left M1/M2 bifurcation cut off  MRI examination of the brain--to be done  Etta Quill PA-C [facility] [phone]  M-F  (9:00 am- 5:00 PM)  03/12/2018, 4:43 PM   Attending addendum Patient seen and examined as code stroke. 73 year old past medical history of stroke with no residual deficits, completely independent drives takes care of her own self, diabetes, syncope,  peripheral artery disease, hypertension hyperlipidemia, cardiomyopathy last normal at 3:30 PM at [facility] when she had sudden onset of right facial droop and weakness. Brought in by EMS as an acute code stroke. Initial NIH stroke scale 22. tPA given as there were no contraindications. Some details obtained over the phone with Daughter Renford Dills. After giving her TPA, CTA head and neck was obtained-I personally reviewed-that showed a occlusion of the left M1/M2 intersection.  Case was discussed with neuro endovascular Dr. Earleen Newport and patient was taken for mechanical thrombectomy.   Assessment:  73 year old woman past history of stroke with no residual deficits, modified Rankin score of 0, diabetes, peripheral artery disease, hypertension hyperlipidemia cardiomyopathy with sudden onset of right facial droop, aphasia and right-sided weakness-consistent with a left MCA syndrome with last known normal 3:30 PM. Brought in as a code stroke.  Evaluated and found to be a candidate for IV TPA which was started under 30 minutes of arrival. Case was discussed neuro interventional-after CTA was done and showed a left M1/M2 intersection occlusion and patient was taken for IR chemical thrombectomy.  Impression: Left MCA acute ischemic stroke   PLAN Cerebral infarction due to embolism of left middle cerebral artery  Acuity: Acute Current Suspected  Etiology: Cardioembolic Continue Evaluation:  -Admit to: Neurological ICU -Hold Aspirin until 24 hour post tPA neuroimaging is stable and without evidence of bleeding -Blood pressure control, goal of SYS <180-post TPA.  If successful endovascular revascularization is obtained, goal systolic blood pressure should be between 1 20-1 40.  I will defer that to neuro IR to order. -MRI/ECHO/A1C/Lipid panel. -Hyperglycemia management per SSI to maintain glucose 140-180mg /dL. -PT/OT/ST therapies and recommendations when able  CNS -Close neuro monitoring  Dysarthria Dysphagia following cerebral infarction  -NPO until cleared by speech -ST -Advance diet as tolerated  Hemiplegia and hemiparesis following cerebral infarction affecting right dominant side -PT/OT -PM&R consult  RESP No acute issues Might need intubation for general anesthesia for the IR guided thrombectomy. Vent management per anesthesia/PCCM  CV Essential (primary) hypertension -Aggressive BP control as above  Heart failure, unspecified -TTE  Hyperlipidemia, unspecified  - Statin for goal LDL < 70  HEME -Monitor -transfuse for hgb < 7  ENDO Type 2 diabetes mellitus with hyperglycemia  -SSI -goal HgbA1c < 7  GI/GU Possible AKI on CKD. CKD Stage 4 (GFR 15-29) -Gentle hydration -avoid nephrotoxic agents  Fluid/Electrolyte Disorders -Replete -Repeat labs  ID Possible Aspiration PNA -CXR -NPO -Monitor  Prophylaxis DVT: SCD GI: Per vent bundle if remains intubated Bowel: Docusate senna  Diet: NPO until cleared by speech/bedside swallow eval after extubation  Code Status: Full Code   THE FOLLOWING WERE PRESENT ON ADMISSION: Acute ischemic stroke, hemiplegia, probable aspiration pneumonia, CKD, CHF, essential hypertension  -- Amie Portland, MD [facility] Pager: [phone] If 7pm to 7am, please call on call as listed on AMION.  CRITICAL CARE ATTESTATION Performed by: Amie Portland, MD Total critical care time: 60 minutes Critical care time was exclusive of separately billable procedures and treating other patients and/or supervising APPs/Residents/Students Critical care was necessary to treat or prevent imminent or life-threatening deterioration due to stroke  This patient is critically ill and at significant risk for neurological worsening and/or death and care requires constant monitoring. Critical care was time spent personally by me on the following activities: development of treatment plan with patient and/or surrogate as well as nursing, discussions with consultants, evaluation of patient's response to treatment, examination of patient, obtaining history from patient or surrogate,  ordering and performing treatments and interventions, ordering and review of laboratory studies, ordering and review of radiographic studies, pulse oximetry, re-evaluation of patient's condition, participation in multidisciplinary rounds and medical decision making of high complexity in the care of this patient.

## 2018-03-08 NOTE — Consult Note (Signed)
NAME:  Yvonne Patel, MRN:  [id number], DOB:  02-Mar-1946, LOS: 0 ADMISSION DATE:  03/26/2018, CONSULTATION DATE:  1/1 REFERRING MD:  Dr. Rory Percy , CHIEF COMPLAINT:  CVA   Brief History   73 year old female admitted with acute CVA s/p systemic TPA and mechanical thrombolysis.   History of present illness   Patient is encephalopathic and/or intubated. Therefore history has been obtained from chart review.  73 year old female with past medical history as below, which is significant for stroke, diabetes, hypertension, and cardiomyopathy.  Her prior stroke did not leave her with any major debilities.  She remains self-reliant and is still able to drive.  She was in her usual state of health until about 1530 when she developed right sided facial droop, hemiplegia, and aphasia. EMS was called and brought her to [facility] as a code stroke. CT of the head was negative for acute hemorrhage and IV TPA was given. CTA showed Left M1/M2 intersection occlusion and she was taken for IR mechanical and aspirational thrombectomy. TICI 2b restoration of flow. Post op she remained intubated and was transferred to ICU.  Past Medical History   has a past medical history of Cardiomyopathy ([place]), Coronary artery disease, Hyperlipidemia, Hypertension, Neuromuscular disorder ([place]), PAD (peripheral artery disease) ([place]), Peripheral vascular disease ([place]), Refusal of blood transfusions as patient is Jehovah's Witness, Retinal disease, Syncope, TIA (transient ischemic attack) (2010; 09/2011), and Type II diabetes mellitus ([place]).  Significant Hospital Events   1/1 admit  Consults:    Procedures:  1/1: VIR procedure completed for left MCA occlusion at M1 segment. The procedure involved two passes of combination mechanical and aspiration thrombectomy with restoration of TICI 2b flow.  Significant Diagnostic Tests:  CT head 1/1 > no acute finding, multiple remote small vessel infarcts CTA head/neck 1/1> Left M2 branch  occlusion beginning at the M1 bifurcation. Severe atherosclerosis which preferentially spares the left carotid circulation, question prior endarterectomy. Severe atheromatous narrowing if not occlusion of bilateral proximal subclavian arteries. There is bilateral fetal type PCA with diffuse thready flow in the posterior fossa. 70% atheromatous narrowing of the right common carotid and ICA bulb. CT head 1/1 (post IR) > New 3 mm high-density within the posterior right frontal cortex which could be focus of hemorrhage or an enhancing subacute infarct. No hemorrhage or infarct seen along the postoperative left MCA distribution.  Micro Data:    Antimicrobials:     Interim history/subjective:    Objective   Blood pressure (!) 90/50, pulse 68, temperature (!) 96.5 F (35.8 C), temperature source Axillary, resp. rate 16, weight 77.7 kg, SpO2 100 %.    FiO2 (%):  [100 %] 100 % Set Rate:  [16 bmp] 16 bmp Vt Set:  [450 mL] 450 mL PEEP:  [5 cmH20] 5 cmH20 Plateau Pressure:  [16 cmH20] 16 cmH20   Intake/Output Summary (Last 24 hours) at 03/21/2018 2059 Last data filed at 03/21/2018 1923 Gross per 24 hour  Intake 1000 ml  Output 50 ml  Net 950 ml   Filed Weights   04/02/2018 1600  Weight: 77.7 kg    Examination: General: elderly appearing female on vent HENT: [facility]/AT, PERRL, no JVD Lungs: Clear, bilateral breath sounds Cardiovascular: RRR, no MRG Abdomen: Soft, non-distended Extremities: No acute deformity Neuro: Sedate   Resolved Hospital Problem list     Assessment & Plan:   Acute CVA:  Left M1/M2 distribution. S/p systemic TPA and Neuro IR thrombectomy.  - Plan per stroke service and neuro  IR. Will include MRI, Echo - SBP goal 140 - 160 mmHg - Clevidipine and phenylephrine as needed for BP goals.  - Full vent support with propofol for sedation. RASS goal 0 to -1 - VAP bundle - check ABG and CXR  Hypertension PAD - BP goals/medications as above - Monitor distal pulses,  Bilateral lower extremity pulses have been obtainable only when she is very hypertensive even before IR case. IR aware.   Chest pain: Troponin elevated as well.  - Trend troponin - Im told cardiology has been consulted - Echo pending - continue nitroglycerine infusion  DM2: -CBG monitoring and SSI  AKI on CKD: Baseline creat 1.6, now 2.8 on admission - Gentle hydration - Follow BMP  Hypokalemia - Replace K, follow BMP   Best practice:  Diet: NPO Pain/Anxiety/Delirium protocol (if indicated): propofol RASS goal 0 to -1 VAP protocol (if indicated): per protocol DVT prophylaxis: on hold for now after tpa GI prophylaxis: protonix Glucose control: ssi Mobility: BR Code Status: Full Family Communication: no family available Disposition: ICU  Labs   CBC: Recent Labs  Lab 03/26/2018 1642 03/23/2018 1656  WBC  --  6.3  NEUTROABS  --  3.2  HGB 13.9 12.2  HCT 41.0 38.5  MCV  --  94.1  PLT  --  572    Basic Metabolic Panel: Recent Labs  Lab 03/10/2018 1642 03/16/2018 1656  NA 138 140  K 3.8 3.3*  CL 102 101  CO2  --  25  GLUCOSE 180* 191*  BUN 32* 32*  CREATININE 2.20* 2.25*  CALCIUM  --  9.3   GFR: CrCl cannot be calculated (Unknown ideal weight.). Recent Labs  Lab 03/16/2018 1656  WBC 6.3    Liver Function Tests: Recent Labs  Lab 04/02/2018 1656  AST 27  ALT 26  ALKPHOS 97  BILITOT 0.7  PROT 7.4  ALBUMIN 3.0*   No results for input(s): LIPASE, AMYLASE in the last 168 hours. No results for input(s): AMMONIA in the last 168 hours.  ABG    Component Value Date/Time   HCO3 25.0 07/18/2016 0031   TCO2 29 03/11/2018 1642   O2SAT 41.1 07/18/2016 0031     Coagulation Profile: Recent Labs  Lab 03/10/2018 1656  INR 1.00    Cardiac Enzymes: Recent Labs  Lab 04/03/2018 1831  TROPONINI 4.62*    HbA1C: Hgb A1c MFr Bld  Date/Time Value Ref Range Status  05/31/2017 03:59 PM 6.7 (H) 4.8 - 5.6 % Final    Comment:    (NOTE) Pre diabetes:           5.7%-6.4% Diabetes:              >6.4% Glycemic control for   <7.0% adults with diabetes   07/19/2016 02:31 AM 6.3 (H) 4.8 - 5.6 % Final    Comment:    (NOTE)         Pre-diabetes: 5.7 - 6.4         Diabetes: >6.4         Glycemic control for adults with diabetes: <7.0     CBG: Recent Labs  Lab 03/20/2018 1631  GLUCAP 175*    Review of Systems:   unable  Past Medical History  She,  has a past medical history of Cardiomyopathy ([place]), Coronary artery disease, Hyperlipidemia, Hypertension, Neuromuscular disorder ([place]), PAD (peripheral artery disease) ([place]), Peripheral vascular disease ([place]), Refusal of blood transfusions as patient is Jehovah's Witness, Retinal disease, Syncope, TIA (transient ischemic attack) (  2010; 09/2011), and Type II diabetes mellitus ([place]).   Surgical History    Past Surgical History:  Procedure Laterality Date  . AORTIC ARCH ANGIOGRAPHY N/A 06/22/2016   Procedure: Aortic Arch Angiography;  Surgeon: [name], MD;  Location: [facility];  Service: Cardiovascular;  Laterality: N/A;  . CAROTID ANGIOGRAPHY N/A 06/22/2016   Procedure: Carotid Angiography;  Surgeon: [name], MD;  Location: [facility];  Service: Cardiovascular;  Laterality: N/A;  . DILATION AND CURETTAGE OF UTERUS    . ENDARTERECTOMY Left 07/14/2016   Procedure: ENDARTERECTOMY CAROTID LEFT;  Surgeon: Elam Dutch, MD;  Location: [facility];  Service: Vascular;  Laterality: Left;  . FEMORAL ARTERY STENT    . IR ANGIOGRAM EXTREMITY BILATERAL  03/30/2018  . IR PERCUTANEOUS ART THROMBECTOMY/INFUSION INTRACRANIAL INC DIAG ANGIO  04/07/2018  . IR US GUIDE VASC ACCESS RIGHT  03/14/2018  . LEFT HEART CATH AND CORONARY ANGIOGRAPHY N/A 06/22/2016   Procedure: Left Heart Cath and Coronary Angiography;  Surgeon: [name], MD;  Location: [facility];  Service: Cardiovascular;  Laterality: N/A;  . LOWER EXTREMITY ANGIOGRAM N/A 06/05/2012   Procedure: LOWER EXTREMITY ANGIOGRAM;  Surgeon:  [name], MD;  Location: [facility] CATH LAB;  Service: Cardiovascular;  Laterality: N/A;  . LOWER EXTREMITY ANGIOGRAM N/A 07/11/2012   Procedure: LOWER EXTREMITY ANGIOGRAM;  Surgeon: [name], MD;  Location: [facility] CATH LAB;  Service: Cardiovascular;  Laterality: N/A;  . LOWER EXTREMITY ANGIOGRAM N/A 09/19/2012   Procedure: LOWER EXTREMITY ANGIOGRAM;  Surgeon: [name], MD;  Location: [facility] CATH LAB;  Service: Cardiovascular;  Laterality: N/A;  . PATCH ANGIOPLASTY Left 07/14/2016   Procedure: PATCH ANGIOPLASTY;  Surgeon: Elam Dutch, MD;  Location: [facility];  Service: Vascular;  Laterality: Left;  . PERIPHERAL VASCULAR CATHETERIZATION N/A 03/11/2015   Procedure: Renal Angiography;  Surgeon: [name], MD;  Location: [place] [facility];  Service: Cardiovascular;  Laterality: N/A;  . RENAL ANGIOGRAM Left 06/05/2012   Procedure: RENAL ANGIOGRAM;  Surgeon: [name], MD;  Location: [facility] CATH LAB;  Service: Cardiovascular;  Laterality: Left;  . RENAL ANGIOGRAPHY  06/22/2016   Procedure: Renal Angiography;  Surgeon: [name], MD;  Location: [place] [facility];  Service: Cardiovascular;;  . RENAL ARTERY ANGIOPLASTY Right 03/11/2015  . TUBAL LIGATION    . VAGINAL HYSTERECTOMY       Social History   reports that she quit smoking about 42 years ago. Her smoking use included cigarettes. She started smoking about 52 years ago. She has a 15.00 pack-year smoking history. She has never used smokeless tobacco. She reports current alcohol use. She reports that she does not use drugs.   Family History   Her family history is negative for Breast cancer.   Allergies Allergies  Allergen Reactions  . No Known Allergies      Home Medications  Prior to Admission medications   Medication Sig Start Date End Date Taking? Authorizing Provider  aspirin EC 81 MG tablet Take 81 mg by mouth at bedtime.     [provider]  atorvastatin (LIPITOR) 80 MG tablet Take 80 mg by mouth daily at 6 PM.   06/01/16   [provider]  carvedilol (COREG) 3.125 MG tablet Take 1 tablet (3.125 mg total) by mouth 2 (two) times daily. 06/03/17 06/03/18  Lavina Hamman, MD  clopidogrel (PLAVIX) 75 MG tablet Take 75 mg by mouth at bedtime.     [provider]  Ferrous Fumarate (HEMOCYTE - 106 MG FE) 324 (106 Fe) MG TABS tablet Take 1 tablet (106 mg of iron total) by mouth 2 (two) times daily. 07/24/16   Oswald Hillock, MD  furosemide (LASIX) 20 MG tablet Take 1 tablet (20 mg total) by mouth daily as needed. For weight gain of 3 lbs in 1 day or 5Lbs in 2 days. 06/03/17 06/03/18  Lavina Hamman, MD  insulin degludec (TRESIBA FLEXTOUCH) 100 UNIT/ML SOPN FlexTouch Pen Inject 15 Units into the skin every morning.     [provider]  metFORMIN (GLUCOPHAGE) 500 MG tablet Take 2 tablets (1,000 mg total) by mouth 2 (two) times daily. 06/24/16   [name], MD     Critical care time: 86 mins     Georgann Housekeeper, AGACNP-BC [place] Pager [phone] or [phone]  03/27/2018 9:22 PM

## 2018-03-08 NOTE — Sedation Documentation (Addendum)
Spoke with Yvonne Patel in pt placement re bed assignment. Pt will go to 4N22.

## 2018-03-08 NOTE — Progress Notes (Signed)
VIR procedure completed for left MCA occlusion at M1 segment. The procedure involved two passes of combination mechanical and aspiration thrombectomy with restoration of TICI 2b flow. There were no complications.   She is in CT for post-procedure scan. Goal SBP will be 140-160.   Troponin was 4.6, which is concerning for possible MI. Troponins are being cycled and a STAT EKG has been ordered. Cardiology is also being consulted. The patient will be transferred to 4N22 after CT.  Electronically signed: Dr. Kerney Elbe

## 2018-03-08 NOTE — Sedation Documentation (Addendum)
Spoke to [employer] with transport. Requested 4N22 be brought to IR2.

## 2018-03-08 NOTE — Anesthesia Postprocedure Evaluation (Signed)
Anesthesia Post Note  Patient: Yvonne Patel  Procedure(s) Performed: IR WITH ANESTHESIA (N/A )     Patient location during evaluation: SICU Anesthesia Type: General Level of consciousness: sedated Pain management: pain level controlled Vital Signs Assessment: post-procedure vital signs reviewed and stable Respiratory status: patient remains intubated per anesthesia plan Cardiovascular status: stable Postop Assessment: no apparent nausea or vomiting Anesthetic complications: no    Last Vitals:  Vitals:   04/05/2018 2030 03/20/2018 2045  BP: 95/67 (!) 90/50  Pulse:  68  Resp: 16 16  Temp:    SpO2: 100% 100%    Last Pain:  Vitals:   03/23/2018 2002  TempSrc: Axillary                 Adriona Kaney DANIEL

## 2018-03-08 NOTE — Anesthesia Procedure Notes (Signed)
Procedure Name: Intubation Date/Time: 03/28/2018 5:48 PM Performed by: Elayne Snare, CRNA Pre-anesthesia Checklist: Patient identified, Emergency Drugs available, Suction available and Patient being monitored Patient Re-evaluated:Patient Re-evaluated prior to induction Oxygen Delivery Method: Circle System Utilized Preoxygenation: Pre-oxygenation with 100% oxygen Induction Type: IV induction, Rapid sequence and Cricoid Pressure applied Laryngoscope Size: Mac and 3 Grade View: Grade I Tube type: Oral Tube size: 7.5 mm Number of attempts: 1 Airway Equipment and Method: Stylet and Oral airway Placement Confirmation: ETT inserted through vocal cords under direct vision,  positive ETCO2 and breath sounds checked- equal and bilateral Secured at: 20 cm Tube secured with: Tape Dental Injury: Teeth and Oropharynx as per pre-operative assessment

## 2018-03-08 NOTE — Transfer of Care (Signed)
Immediate Anesthesia Transfer of Care Note  Patient: Yvonne Patel  Procedure(s) Performed: IR WITH ANESTHESIA (N/A )  Patient Location: PACU  Anesthesia Type:General  Level of Consciousness: Patient remains intubated per anesthesia plan  Airway & Oxygen Therapy: Patient remains intubated per anesthesia plan and Patient placed on Ventilator (see vital sign flow sheet for setting)  Post-op Assessment: Report given to RN and Post -op Vital signs reviewed and stable  Post vital signs: Reviewed and stable  Last Vitals:  Vitals Value Taken Time  BP 122/95 04/03/2018  8:03 PM  Temp    Pulse 83 04/03/2018  8:07 PM  Resp 20 04/03/2018  8:07 PM  SpO2 100 % 03/23/2018  8:07 PM  Vitals shown include unvalidated device data.  Last Pain: There were no vitals filed for this visit.       Complications: No apparent anesthesia complications

## 2018-03-08 NOTE — Progress Notes (Signed)
Pharmacist Code Stroke Response  Notified to mix tPA at 1640 by Dr. Rory Percy Delivered tPA to RN at 1645  tPA dose = 7mg  bolus over 1 minute followed by 62.9mg  for a total dose of 69.9mg  over 1 hour  Issues/delays encountered (if applicable):   Onnie Boer, PharmD, BCIDP, AAHIVP, CPP Infectious Disease Pharmacist 03/22/2018 4:54 PM

## 2018-03-09 ENCOUNTER — Inpatient Hospital Stay (HOSPITAL_COMMUNITY): Payer: PPO

## 2018-03-09 ENCOUNTER — Inpatient Hospital Stay (HOSPITAL_BASED_OUTPATIENT_CLINIC_OR_DEPARTMENT_OTHER): Payer: PPO

## 2018-03-09 ENCOUNTER — Encounter (HOSPITAL_COMMUNITY): Payer: Self-pay | Admitting: Radiology

## 2018-03-09 DIAGNOSIS — I639 Cerebral infarction, unspecified: Secondary | ICD-10-CM | POA: Diagnosis not present

## 2018-03-09 DIAGNOSIS — J9600 Acute respiratory failure, unspecified whether with hypoxia or hypercapnia: Secondary | ICD-10-CM

## 2018-03-09 DIAGNOSIS — I214 Non-ST elevation (NSTEMI) myocardial infarction: Secondary | ICD-10-CM

## 2018-03-09 DIAGNOSIS — I10 Essential (primary) hypertension: Secondary | ICD-10-CM

## 2018-03-09 DIAGNOSIS — Z978 Presence of other specified devices: Secondary | ICD-10-CM

## 2018-03-09 DIAGNOSIS — R739 Hyperglycemia, unspecified: Secondary | ICD-10-CM

## 2018-03-09 DIAGNOSIS — I5021 Acute systolic (congestive) heart failure: Secondary | ICD-10-CM

## 2018-03-09 LAB — LIPID PANEL
Cholesterol: 141 mg/dL (ref 0–200)
HDL: 25 mg/dL — ABNORMAL LOW (ref >40)
LDL CALC: 92 mg/dL (ref 0–99)
Total CHOL/HDL Ratio: 5.6 RATIO
Triglycerides: 120 mg/dL (ref <150)
VLDL: 24 mg/dL (ref 0–40)

## 2018-03-09 LAB — CBC
HEMATOCRIT: 30.1 % — AB (ref 36.0–46.0)
Hemoglobin: 9.5 g/dL — ABNORMAL LOW (ref 12.0–15.0)
MCH: 30.1 pg (ref 26.0–34.0)
MCHC: 31.6 g/dL (ref 30.0–36.0)
MCV: 95.3 fL (ref 80.0–100.0)
Platelets: 150 10*3/uL (ref 150–400)
RBC: 3.16 MIL/uL — ABNORMAL LOW (ref 3.87–5.11)
RDW: 12.9 % (ref 11.5–15.5)
WBC: 6.6 10*3/uL (ref 4.0–10.5)
nRBC: 0 % (ref 0.0–0.2)

## 2018-03-09 LAB — TROPONIN I
Troponin I: 6.47 ng/mL (ref <0.03)
Troponin I: 5.08 ng/mL (ref <0.03)
Troponin I: 4.7 ng/mL (ref <0.03)
Troponin I: 3.81 ng/mL (ref <0.03)
Troponin I: 3.88 ng/mL (ref <0.03)

## 2018-03-09 LAB — POCT I-STAT 3, ART BLOOD GAS (G3+)
Acid-base deficit: 1 mmol/L (ref 0.0–2.0)
Bicarbonate: 22.9 mmol/L (ref 20.0–28.0)
O2 Saturation: 98 %
Patient temperature: 98.5
TCO2: 24 mmol/L (ref 22–32)
pCO2 arterial: 33.9 mmHg (ref 32.0–48.0)
pH, Arterial: 7.437 (ref 7.350–7.450)
pO2, Arterial: 105 mmHg (ref 83.0–108.0)

## 2018-03-09 LAB — RENAL FUNCTION PANEL
Albumin: 2.1 g/dL — ABNORMAL LOW (ref 3.5–5.0)
Anion gap: 11 (ref 5–15)
BUN: 25 mg/dL — ABNORMAL HIGH (ref 8–23)
CHLORIDE: 107 mmol/L (ref 98–111)
CO2: 21 mmol/L — AB (ref 22–32)
Calcium: 8.1 mg/dL — ABNORMAL LOW (ref 8.9–10.3)
Creatinine, Ser: 2.06 mg/dL — ABNORMAL HIGH (ref 0.44–1.00)
GFR calc Af Amer: 27 mL/min — ABNORMAL LOW (ref >60)
GFR calc non Af Amer: 23 mL/min — ABNORMAL LOW (ref >60)
GLUCOSE: 174 mg/dL — AB (ref 70–99)
Phosphorus: 5.4 mg/dL — ABNORMAL HIGH (ref 2.5–4.6)
Potassium: 3.6 mmol/L (ref 3.5–5.1)
Sodium: 139 mmol/L (ref 135–145)

## 2018-03-09 LAB — HEMOGLOBIN A1C
Hgb A1c MFr Bld: 7.1 % — ABNORMAL HIGH (ref 4.8–5.6)
Mean Plasma Glucose: 157.07 mg/dL

## 2018-03-09 LAB — GLUCOSE, CAPILLARY
Glucose-Capillary: 175 mg/dL — ABNORMAL HIGH (ref 70–99)
Glucose-Capillary: 161 mg/dL — ABNORMAL HIGH (ref 70–99)
Glucose-Capillary: 182 mg/dL — ABNORMAL HIGH (ref 70–99)

## 2018-03-09 LAB — MAGNESIUM: Magnesium: 1.9 mg/dL (ref 1.7–2.4)

## 2018-03-09 MED ORDER — FENTANYL CITRATE (PF) 100 MCG/2ML IJ SOLN
50.0000 ug | INTRAMUSCULAR | Status: DC | PRN
  Administered 2018-03-09 – 2018-03-12 (×5): 50 ug via INTRAVENOUS
  Filled 2018-03-09 (×6): qty 2

## 2018-03-09 MED ORDER — CLOPIDOGREL BISULFATE 75 MG PO TABS
75.0000 mg | ORAL_TABLET | Freq: Every day | ORAL | Status: DC
  Administered 2018-03-10 – 2018-03-16 (×8): 75 mg via ORAL
  Filled 2018-03-09 (×8): qty 1

## 2018-03-09 MED ORDER — FERROUS FUMARATE 324 (106 FE) MG PO TABS
1.0000 | ORAL_TABLET | Freq: Two times a day (BID) | ORAL | Status: DC
  Administered 2018-03-10 – 2018-03-13 (×7): 106 mg via ORAL
  Filled 2018-03-09 (×8): qty 1

## 2018-03-09 MED ORDER — ASPIRIN EC 81 MG PO TBEC
81.0000 mg | DELAYED_RELEASE_TABLET | Freq: Every day | ORAL | Status: DC
  Administered 2018-03-10 – 2018-03-12 (×4): 81 mg via ORAL
  Filled 2018-03-09 (×4): qty 1

## 2018-03-09 MED ORDER — FUROSEMIDE 10 MG/ML IJ SOLN
40.0000 mg | Freq: Once | INTRAMUSCULAR | Status: AC
  Administered 2018-03-09: 40 mg via INTRAVENOUS
  Filled 2018-03-09: qty 4

## 2018-03-09 MED ORDER — INSULIN ASPART 100 UNIT/ML ~~LOC~~ SOLN
0.0000 [IU] | SUBCUTANEOUS | Status: DC
  Administered 2018-03-09 (×3): 2 [IU] via SUBCUTANEOUS
  Administered 2018-03-10: 1 [IU] via SUBCUTANEOUS
  Administered 2018-03-10 (×2): 2 [IU] via SUBCUTANEOUS
  Administered 2018-03-10: 1 [IU] via SUBCUTANEOUS
  Administered 2018-03-10: 2 [IU] via SUBCUTANEOUS
  Administered 2018-03-10: 1 [IU] via SUBCUTANEOUS
  Administered 2018-03-11 (×3): 2 [IU] via SUBCUTANEOUS
  Administered 2018-03-11: 1 [IU] via SUBCUTANEOUS
  Administered 2018-03-11: 2 [IU] via SUBCUTANEOUS
  Administered 2018-03-12: 3 [IU] via SUBCUTANEOUS
  Administered 2018-03-12: 1 [IU] via SUBCUTANEOUS
  Administered 2018-03-12: 3 [IU] via SUBCUTANEOUS
  Administered 2018-03-12: 2 [IU] via SUBCUTANEOUS
  Administered 2018-03-12: 5 [IU] via SUBCUTANEOUS
  Administered 2018-03-12: 1 [IU] via SUBCUTANEOUS
  Administered 2018-03-12: 3 [IU] via SUBCUTANEOUS
  Administered 2018-03-13 (×2): 5 [IU] via SUBCUTANEOUS

## 2018-03-09 NOTE — Progress Notes (Signed)
STROKE TEAM PROGRESS NOTE   SUBJECTIVE (INTERVAL HISTORY) Her two daughters are at the bedside.  She was still intubated, on sedation.  Has left femoral line for blood pressure monitoring, BP 150s.  Patient has bilateral subclavian artery high-grade stenosis, not able to get BP monitoring over the arms.  Cardiology on board for elevated troponin and non-STEMI.  No intervention recommended at this time.  MRI/MRA pending in the afternoon.   OBJECTIVE Temp:  [96.5 F (35.8 C)-98.5 F (36.9 C)] 98.5 F (36.9 C) (01/02 0800) Pulse Rate:  [65-108] 75 (01/02 1000) Cardiac Rhythm: Sinus tachycardia;Normal sinus rhythm (01/02 0800) Resp:  [16-28] 16 (01/02 1000) BP: (83-126)/(48-95) 83/60 (01/01 2115) SpO2:  [96 %-100 %] 100 % (01/02 1000) Arterial Line BP: (109-221)/(36-90) 168/55 (01/02 1000) FiO2 (%):  [30 %-100 %] 30 % (01/02 0837) Weight:  [77.7 kg] 77.7 kg (01/01 1600)  Recent Labs  Lab 04/01/2018 1631  GLUCAP 175*   Recent Labs  Lab 03/22/2018 1642 04/03/2018 1656  NA 138 140  K 3.8 3.3*  CL 102 101  CO2  --  25  GLUCOSE 180* 191*  BUN 32* 32*  CREATININE 2.20* 2.25*  CALCIUM  --  9.3   Recent Labs  Lab 03/21/2018 1656  AST 27  ALT 26  ALKPHOS 97  BILITOT 0.7  PROT 7.4  ALBUMIN 3.0*   Recent Labs  Lab 04/03/2018 1642 03/10/2018 1656  WBC  --  6.3  NEUTROABS  --  3.2  HGB 13.9 12.2  HCT 41.0 38.5  MCV  --  94.1  PLT  --  174   Recent Labs  Lab 03/11/2018 1831 03/09/18 0019 03/09/18 0432  TROPONINI 4.62* 6.47* 5.08*   Recent Labs    04/05/2018 1656  LABPROT 13.1  INR 1.00   No results for input(s): COLORURINE, LABSPEC, PHURINE, GLUCOSEU, HGBUR, BILIRUBINUR, KETONESUR, PROTEINUR, UROBILINOGEN, NITRITE, LEUKOCYTESUR in the last 72 hours.  Invalid input(s): APPERANCEUR     Component Value Date/Time   CHOL 141 03/09/2018 0432   TRIG 120 03/09/2018 0432   HDL 25 (L) 03/09/2018 0432   CHOLHDL 5.6 03/09/2018 0432   VLDL 24 03/09/2018 0432   LDLCALC 92 03/09/2018  0432   Lab Results  Component Value Date   HGBA1C 7.1 (H) 03/09/2018   No results found for: LABOPIA, COCAINSCRNUR, LABBENZ, AMPHETMU, THCU, LABBARB  No results for input(s): ETH in the last 168 hours.  I have personally reviewed the radiological images below and agree with the radiology interpretations.  Ct Angio Head W Or Wo Contrast  Result Date: 04/04/2018 CLINICAL DATA:  Code stroke, deficit not specified EXAM: CT ANGIOGRAPHY HEAD AND NECK TECHNIQUE: Multidetector CT imaging of the head and neck was performed using the standard protocol during bolus administration of intravenous contrast. Multiplanar CT image reconstructions and MIPs were obtained to evaluate the vascular anatomy. Carotid stenosis measurements (when applicable) are obtained utilizing NASCET criteria, using the distal internal carotid diameter as the denominator. CONTRAST:  62mL ISOVUE-370 IOPAMIDOL (ISOVUE-370) INJECTION 76% COMPARISON:  None. FINDINGS: CTA NECK FINDINGS Aortic arch: Extensive atherosclerotic calcification. Three vessel branching Right carotid system: Diffuse mainly calcified atherosclerotic plaque which limits visualization of the lumen due to plaque blooming. There is distal common carotid stenosis that measures up to 70% (when compared to the more proximal vessel given the immediate downstream bifurcation). There is ICA bulb plaque with 70% stenosis. Left carotid system: Much less extensive atherosclerotic plaque, question prior carotid endarterectomy. No stenosis or ulceration. Vertebral arteries: Severe  bilateral proximal subclavian stenosis, with possible occlusion on the right. Narrowing on the left involves and extensive segment. No flow is seen within vertebral arteries until the V3 segments, there may be retrograde flow Skeleton: Diffuse degenerative disease.  No acute finding Other neck: Bilateral cataract resection. Upper chest: Hazy density of the lungs which could be from atelectasis. No Kerley lines  to implicate edema. Review of the MIP images confirms the above findings CTA HEAD FINDINGS Anterior circulation: Confluent atherosclerotic calcification on the carotid siphons with small vessel size and plaque blooming limiting luminal visualization. There is a left M2 branch occlusion beginning at the M1 bifurcation with subsequent downstream reconstitution. No contralateral embolism is seen. Posterior circulation: Tiny vertebrobasilar arteries. There may be a basilar interruption from atherosclerosis or developmental variant. Fetal type flow to both posterior cerebral arteries which are symmetrically patent Venous sinuses: Patent as permitted by contrast timing Anatomic variants: As above Delayed phase: Not obtained in the emergent setting Review of the MIP images confirms the above findings Case discussed with Dr. Rory Percy at 5:04 p.m. IMPRESSION: 1. Left M2 branch occlusion beginning at the M1 bifurcation. 2. Severe atherosclerosis which preferentially spares the left carotid circulation, question prior endarterectomy. 3. Severe atheromatous narrowing if not occlusion of bilateral proximal subclavian arteries. There is bilateral fetal type PCA with diffuse thready flow in the posterior fossa. 4. 70% atheromatous narrowing of the right common carotid and ICA bulb. Electronically Signed   By: Monte Fantasia M.D.   On: 03/21/2018 17:16   Ct Head Wo Contrast  Result Date: 03/21/2018 CLINICAL DATA:  Post thrombectomy EXAM: CT HEAD WITHOUT CONTRAST TECHNIQUE: Contiguous axial images were obtained from the base of the skull through the vertex without intravenous contrast. COMPARISON:  Head CT from earlier today FINDINGS: Brain: 3 mm focus of high density along the high and posterior right frontal convexity, on reformats this appears to be intraparenchymal. No hemorrhagic conversion or visible infarct in the area of procedure. Multiple remote small vessel infarcts. Vascular: Major vessels are symmetrically enhancing.  Skull: Negative Sinuses/Orbits: Negative These results were called by telephone at the time of interpretation on 03/29/2018 at 8:17 pm to Dr. Cheral Marker , who verbally acknowledged these results. IMPRESSION: New 3 mm high-density within the posterior right frontal cortex which could be focus of hemorrhage or an enhancing subacute infarct. No hemorrhage or infarct seen along the postoperative left MCA distribution. Electronically Signed   By: Monte Fantasia M.D.   On: 03/16/2018 20:18   Ct Angio Neck W Or Wo Contrast  Result Date: 03/12/2018 CLINICAL DATA:  Code stroke, deficit not specified EXAM: CT ANGIOGRAPHY HEAD AND NECK TECHNIQUE: Multidetector CT imaging of the head and neck was performed using the standard protocol during bolus administration of intravenous contrast. Multiplanar CT image reconstructions and MIPs were obtained to evaluate the vascular anatomy. Carotid stenosis measurements (when applicable) are obtained utilizing NASCET criteria, using the distal internal carotid diameter as the denominator. CONTRAST:  91mL ISOVUE-370 IOPAMIDOL (ISOVUE-370) INJECTION 76% COMPARISON:  None. FINDINGS: CTA NECK FINDINGS Aortic arch: Extensive atherosclerotic calcification. Three vessel branching Right carotid system: Diffuse mainly calcified atherosclerotic plaque which limits visualization of the lumen due to plaque blooming. There is distal common carotid stenosis that measures up to 70% (when compared to the more proximal vessel given the immediate downstream bifurcation). There is ICA bulb plaque with 70% stenosis. Left carotid system: Much less extensive atherosclerotic plaque, question prior carotid endarterectomy. No stenosis or ulceration. Vertebral arteries: Severe bilateral proximal subclavian  stenosis, with possible occlusion on the right. Narrowing on the left involves and extensive segment. No flow is seen within vertebral arteries until the V3 segments, there may be retrograde flow Skeleton: Diffuse  degenerative disease.  No acute finding Other neck: Bilateral cataract resection. Upper chest: Hazy density of the lungs which could be from atelectasis. No Kerley lines to implicate edema. Review of the MIP images confirms the above findings CTA HEAD FINDINGS Anterior circulation: Confluent atherosclerotic calcification on the carotid siphons with small vessel size and plaque blooming limiting luminal visualization. There is a left M2 branch occlusion beginning at the M1 bifurcation with subsequent downstream reconstitution. No contralateral embolism is seen. Posterior circulation: Tiny vertebrobasilar arteries. There may be a basilar interruption from atherosclerosis or developmental variant. Fetal type flow to both posterior cerebral arteries which are symmetrically patent Venous sinuses: Patent as permitted by contrast timing Anatomic variants: As above Delayed phase: Not obtained in the emergent setting Review of the MIP images confirms the above findings Case discussed with Dr. Rory Percy at 5:04 p.m. IMPRESSION: 1. Left M2 branch occlusion beginning at the M1 bifurcation. 2. Severe atherosclerosis which preferentially spares the left carotid circulation, question prior endarterectomy. 3. Severe atheromatous narrowing if not occlusion of bilateral proximal subclavian arteries. There is bilateral fetal type PCA with diffuse thready flow in the posterior fossa. 4. 70% atheromatous narrowing of the right common carotid and ICA bulb. Electronically Signed   By: Monte Fantasia M.D.   On: 03/16/2018 17:16   Ir Angiogram Extremity Bilateral  Result Date: 04/01/2018 INDICATION: 73 year old female with a history of acute left MCA stroke. She presents within time frame for IV tPA, and is a candidate for mechanical thrombectomy EXAM: ULTRASOUND GUIDED ACCESS RIGHT COMMON FEMORAL ARTERY ULTRASOUND GUIDED ACCESS LEFT COMMON FEMORAL ARTERY FOR ARTERIAL MONITORING ANGIOGRAM RIGHT LOWER EXTREMITY BALLOON ANGIOPLASTY STENOSIS  RIGHT COMMON ILIAC ARTERY IN ORDER TO PASS 8 FRENCH SHEATH TO COMPLETE THE THROMBECTOMY CEREBRAL ANGIOGRAM MECHANICAL THROMBECTOMY LEFT MCA EMERGENT LARGE VESSEL OCCLUSION DEPLOYMENT OF ANGIO-SEAL RIGHT COMMON FEMORAL ARTERY COMPARISON:  CT imaging of the same day MEDICATIONS: 2 g Ancef IV. The antibiotic was administered within 1 hour of the procedure ANESTHESIA/SEDATION: General endotracheal tube anesthesia CONTRAST:  96 cc Isovue-300 FLUOROSCOPY TIME:  Fluoroscopy Time: 26 minutes 24 seconds (1,362 mGy). COMPLICATIONS: None TECHNIQUE: Informed written consent was obtained from the patient's family after a thorough discussion of the procedural risks, benefits and alternatives. Specific risks discussed include: Bleeding, infection, contrast reaction, kidney injury/failure, need for further procedure/surgery, arterial injury or dissection, embolization to new territory, intracranial hemorrhage (10-15% risk), neurologic deterioration, cardiopulmonary collapse, death. All questions were addressed. Maximal Sterile Barrier Technique was utilized including during the procedure including caps, mask, sterile gowns, sterile gloves, sterile drape, hand hygiene and skin antiseptic. A timeout was performed prior to the initiation of the procedure. The anesthesia team was present to provide general endotracheal tube anesthesia and for patient monitoring during the procedure. Interventional neuro radiology nursing staff was also present. 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 Surgical changes of prior endarterectomy evident on prior CT imaging. Left external carotid artery: Patent with antegrade flow. Left internal carotid artery: Normal course caliber and contour of the cervical portion. Vertical and petrous segment patent with normal course caliber contour. Cavernous segment patent. Clinoid segment  patent. Antegrade flow of the ophthalmic artery. Ophthalmic segment patent. Terminus patent. Left MCA: Distal M1 occluded  at the initiation of the case. There is partial filling of an inferior branch which is the non dominant branch to the frontal. Patent fetal PCA to the left P2 segment. Left ACA: A 1 segment patent. A 2 segment perfuses the right territory. Completion Findings: Left MCA: After 2 passes of mechanical thrombectomy plus aspiration, there is near complete restoration of flow through the left MCA territory, with slow flow through an M3/M4 branch of the parietal region compatible with TI CI 2b flow. 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 Physical exam At the initiation of the case, palpable bilateral common femoral artery pulses present No palpable distal pulses Doppler positive pulses of the right posterior tibial and anterior tibial (with systolic blood pressures above 200) Doppler positive pulse at the left posterior tibial (with systolic blood pressure above 200), with no Doppler signal at the left anterior tibial At completion of case Doppler positive pulses were only discovered on the right posterior tibial and anterior tibial arteries with blood pressures above 200. In the 140-160 range (goal) Doppler signal was not evident A completion of case Doppler positive pulses were only discovered on the left at the posterior tibial and not the left anterior tibial (with the pressure above 008 systolic). PROCEDURE: Patient is brought emergently to the neuro angiography suite, with the patient identified appropriately and placed supine position on the table. Left radial arterial line was attempted by the anesthesia team. The patient is then prepped and draped in the usual sterile fashion. Ultrasound survey of the right inguinal region was performed with images stored and sent to PACs. 11 blade scalpel was used to make a small incision. Blunt dissection was  performed. A micropuncture needle was used access the right common femoral artery under ultrasound. With excellent arterial blood flow returned, an .018 micro wire was passed through the needle, observed to enter the abdominal aorta under fluoroscopy. The needle was removed, and a micropuncture sheath was placed over the wire. The inner dilator and wire were removed, and an 035 Bentson wire was advanced under fluoroscopy into the abdominal aorta. The sheath was removed and a standard 5 [place] vascular sheath was placed. The dilator was removed and the sheath was flushed. Ultrasound survey of the left inguinal region was then performed with images stored and sent to PACs, confirming patency of the vessel. A micropuncture needle was used access the left common femoral artery under ultrasound. With excellent arterial blood flow returned, and an .018 micro wire was passed through the needle, observed enter the abdominal aorta under fluoroscopy. The needle was removed, and a micropuncture sheath was placed over the wire. The inner dilator and wire were removed, and an 035 Bentson wire was advanced under fluoroscopy into the abdominal aorta. The sheath was removed and a standard 4 [place] vascular sheath was placed for arterial monitoring. The dilator was removed and the sheath was flushed. A 47F JB-1 diagnostic catheter was then advanced over the wire through the existing right femoral access to the proximal descending thoracic aorta. Wire was then removed. Double flush of the catheter was performed. Catheter was then used to select the left cervical internal carotid artery. Formal angiogram was performed, with roadmap achieved. Exchange length Rosen wire was then passed through the diagnostic catheter to the distal cervical ICA and the diagnostic catheter was removed. The 5 French sheath was removed, with attempt at placing 8 French 55 cm bright tip sheath. Significant resistance was encountered with passing the sheath  through the right iliac system. The sheath would not advanced through the pre-existing right common iliac artery stent. Sheath was withdrawn and rotated. We attempted to advance the sheath over the introducer which was pinned on the wire. We also removed the introducer in place to diagnostic catheter into the aorta and attempted to pass the sheath over the diagnostic catheter. None of these maneuvers were successful. Sheath was then withdrawn into the external iliac artery. Balloon angioplasty was performed of the pre-existing right common iliac artery stent with 5 mm x 40 mm standard balloon angioplasty. Balloon was removed and the introducer was then replaced. Eight French sheath was then again attempted to pass over the wire, unsuccessful through this segment of the iliac artery. The introducer was removed, diagnostic catheter was placed into the cervical segment of the internal carotid artery, and the Rose an wire was exchanged for a 260 cm Amplatz wire. With the stiff wire in place, another attempt was made to pass the sheath which was unsuccessful. Sheath was then again withdrawn to the external iliac artery, introducer was removed, and a second balloon angioplasty 6 mm x 40 mm was performed at the pre-existing stent. The sheath was then successfully advanced over the balloon as the balloon was deflated, into the abdominal aorta. The introducer was again placed after removing the balloon and the sheath was advanced 6 successfully into the aorta. Introducer was withdrawn. Once the sheath was advanced over the wire to the thoracic aorta, sheath was flushed and attached to pressurized and heparinized saline bag for constant forward flow. Eight French 95 cm flow gate balloon catheter was then advanced over the wire to the distal cervical segment. Wire was removed. Then a coaxial intermediate catheter and microcatheter combination was prepared on the back table. This combination was penumbra Ace 64 catheter and a  Trevo Provue18 microcatheter, with a synchro soft wire. This combination was then advanced through the balloon guide into the ICA. System was advanced into the internal carotid artery, to the level of the occlusion. The micro wire was then carefully advanced through the occluded segment, with a distal knuckle configuration. Microcatheter was then pushed through the occluded segment and the wire was removed. Intermediate catheter was advanced over the micro wire to the carotid siphon, which would not advance beyond the ophthalmic segment through the terminus. Blood was then aspirated through the hub of the microcatheter, and a gentle contrast injection was performed confirming intraluminal position. A rotating hemostatic valve was then attached to the back end of the microcatheter, and a pressurized and heparinized saline bag was attached to the catheter. 4 mm x 40 mm solitaire device was then selected. Back flush was achieved at the rotating hemostatic valve, and then the device was gently advanced through the microcatheter to the distal end. The retriever was then unsheathed by withdrawing the microcatheter under fluoroscopy. Once the retriever was completely unsheathed, the microcatheter was carefully stripped from the delivery wire of the device. Control angiogram was then performed from the balloon catheter. 3 minute time interval was observed. The balloon on the balloon guide was then inflated to profile of the vessel. Constant aspiration was then performed through the intermediate catheter, and constant gentle aspiration was performed at the balloon guide. This aspiration was continued as the retriever was gently and slowly withdrawn with fluoroscopic observation into the distal intermediate catheter. The entire system was then gently withdrawn from the intracranial ICA and into the balloon guide. Once the retriever was entirely removed from  the system, free aspiration was confirmed at the hub of the balloon  guide, with free blood return confirmed. Control angiogram was then performed. TICI 2a flow was achieved. Repeat angiogram with multiple angulation demonstrated persistent occlusion of parietal branch. The same coaxial system was again passed through the balloon guide catheter. The microcatheter system was then advanced through the occlusion of the parietal branch. Once the micro wire microcatheter were beyond the occlusion, the micro wire was removed and stentreiver device was deployed, stripping the microcatheter from the wire. After the device was deployed across the occlusion, the intermediate catheter was again attempted to advanced to the M1 segment. This was unsuccessful, and would not advance beyond the ophthalmic segment. The balloon at the balloon guide was inflated to profile of the vessel. Local aspiration was performed at the intermediate catheter upon withdrawal of the device under fluoroscopic observation, with aspiration at the balloon guide. Once the retrieve her and intermediate catheter were entirely removed from the system, free aspiration was confirmed at the hub of the intermediate catheter, with free blood return confirmed. Control angiogram was again performed. Improved flow was confirmed, TICI 2b. Balloon guide was withdrawn and angiogram of the cervical segment was performed. We then removed the balloon guide catheter and deployed an 8 French Angio-Seal at the right common femoral artery access. Angiogram was performed of the right sided access and the left-sided access before completing the case. Patient tolerated the procedure well and remained hemodynamically stable throughout. No complications were encountered. Estimated blood loss approximately 50 cc. IMPRESSION: Status post ultrasound guided access right common femoral artery for cerebral angiogram and a combination of aspiration and mechanical thrombectomy of left M1 emergent large vessel occlusion, and restoration of TICI 2b flow  after 2 passes. Status post balloon angioplasty to 6 mm of proximal right common iliac artery for treatment of a restrictive lesion in order to pass 8 French sheath to perform the thrombectomy. Status post deployment of Angio-Seal at the right common femoral artery. At the conclusion of the case, a 4 [place] sheath remains in left common femoral artery for arterial monitoring. Signed, Dulcy Fanny. Dellia Nims, RPVI [facility] Radiology PLAN: Formal CT after the case Right hip straight overnight status post 8 French device and Angio-Seal closure Left common femoral artery for French sheath may be removed when hemodynamic monitoring is no longer needed with manual pressure. Goal blood pressure 982 [id number] systolic given the incomplete flow restoration Electronically Signed   By: Corrie Mckusick D.O.   On: 04/06/2018 20:39   Ir US Guide Vasc Access Right  Result Date: 03/20/2018 INDICATION: 73 year old female with a history of acute left MCA stroke. She presents within time frame for IV tPA, and is a candidate for mechanical thrombectomy EXAM: ULTRASOUND GUIDED ACCESS RIGHT COMMON FEMORAL ARTERY ULTRASOUND GUIDED ACCESS LEFT COMMON FEMORAL ARTERY FOR ARTERIAL MONITORING ANGIOGRAM RIGHT LOWER EXTREMITY BALLOON ANGIOPLASTY STENOSIS RIGHT COMMON ILIAC ARTERY IN ORDER TO PASS 8 FRENCH SHEATH TO COMPLETE THE THROMBECTOMY CEREBRAL ANGIOGRAM MECHANICAL THROMBECTOMY LEFT MCA EMERGENT LARGE VESSEL OCCLUSION DEPLOYMENT OF ANGIO-SEAL RIGHT COMMON FEMORAL ARTERY COMPARISON:  CT imaging of the same day MEDICATIONS: 2 g Ancef IV. The antibiotic was administered within 1 hour of the procedure ANESTHESIA/SEDATION: General endotracheal tube anesthesia CONTRAST:  96 cc Isovue-300 FLUOROSCOPY TIME:  Fluoroscopy Time: 26 minutes 24 seconds (1,362 mGy). COMPLICATIONS: None TECHNIQUE: Informed written consent was obtained from the patient's family after a thorough discussion of the  procedural risks, benefits and  alternatives. Specific risks discussed include: Bleeding, infection, contrast reaction, kidney injury/failure, need for further procedure/surgery, arterial injury or dissection, embolization to new territory, intracranial hemorrhage (10-15% risk), neurologic deterioration, cardiopulmonary collapse, death. All questions were addressed. Maximal Sterile Barrier Technique was utilized including during the procedure including caps, mask, sterile gowns, sterile gloves, sterile drape, hand hygiene and skin antiseptic. A timeout was performed prior to the initiation of the procedure. The anesthesia team was present to provide general endotracheal tube anesthesia and for patient monitoring during the procedure. Interventional neuro radiology nursing staff was also present. 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 Surgical changes of prior endarterectomy evident on prior CT imaging. Left external carotid artery: Patent with antegrade flow. Left internal carotid artery: Normal course caliber and contour of the cervical portion. Vertical and petrous segment patent with normal course caliber contour. Cavernous segment patent. Clinoid segment patent. Antegrade flow of the ophthalmic artery. Ophthalmic segment patent. Terminus patent. Left MCA: Distal M1 occluded at the initiation of the case. There is partial filling of an inferior branch which is the non dominant branch to the frontal. Patent fetal PCA to the left P2 segment. Left ACA: A 1 segment patent. A 2 segment perfuses the right territory. Completion Findings: Left MCA: After 2 passes of mechanical thrombectomy plus aspiration, there is near complete restoration of flow through the left MCA territory, with slow flow through an M3/M4 branch of the parietal region compatible with TI CI 2b flow. 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 Physical exam At the initiation of the case, palpable bilateral common femoral artery pulses present No palpable distal pulses Doppler positive pulses of the right posterior tibial and anterior tibial (with systolic blood pressures above 200) Doppler positive pulse at the left posterior tibial (with systolic blood pressure above 200), with no Doppler signal at the left anterior tibial At completion of case Doppler positive pulses were only discovered on the right posterior tibial and anterior tibial arteries with blood pressures above 200. In the 140-160 range (goal) Doppler signal was not evident A completion of case Doppler positive pulses were only discovered on the left at the posterior tibial and not the left anterior tibial (with the pressure above 756 systolic). PROCEDURE: Patient is brought emergently to the neuro angiography suite, with the patient identified appropriately and placed supine position on the table. Left radial arterial line was attempted by the anesthesia team. The patient is then prepped and draped in the usual sterile fashion. Ultrasound survey of the right inguinal region was performed with images stored and sent to PACs. 11 blade scalpel was used to make a small incision. Blunt dissection was performed. A micropuncture needle was used access the right common femoral artery under ultrasound. With excellent arterial blood flow returned, an .018 micro wire was passed through the needle, observed to enter the abdominal aorta under fluoroscopy. The needle was removed, and a micropuncture sheath was placed over the wire. The inner dilator and wire were removed, and an 035 Bentson wire was advanced under fluoroscopy into the abdominal aorta. The sheath was removed and a standard 5 [place] vascular sheath was placed. The dilator was removed and the sheath was flushed. Ultrasound survey of the left inguinal region was then performed  with images stored and sent to PACs, confirming patency of the vessel. A micropuncture needle was used access the left common femoral artery  under ultrasound. With excellent arterial blood flow returned, and an .018 micro wire was passed through the needle, observed enter the abdominal aorta under fluoroscopy. The needle was removed, and a micropuncture sheath was placed over the wire. The inner dilator and wire were removed, and an 035 Bentson wire was advanced under fluoroscopy into the abdominal aorta. The sheath was removed and a standard 4 [place] vascular sheath was placed for arterial monitoring. The dilator was removed and the sheath was flushed. A 48F JB-1 diagnostic catheter was then advanced over the wire through the existing right femoral access to the proximal descending thoracic aorta. Wire was then removed. Double flush of the catheter was performed. Catheter was then used to select the left cervical internal carotid artery. Formal angiogram was performed, with roadmap achieved. Exchange length Rosen wire was then passed through the diagnostic catheter to the distal cervical ICA and the diagnostic catheter was removed. The 5 French sheath was removed, with attempt at placing 8 French 55 cm bright tip sheath. Significant resistance was encountered with passing the sheath through the right iliac system. The sheath would not advanced through the pre-existing right common iliac artery stent. Sheath was withdrawn and rotated. We attempted to advance the sheath over the introducer which was pinned on the wire. We also removed the introducer in place to diagnostic catheter into the aorta and attempted to pass the sheath over the diagnostic catheter. None of these maneuvers were successful. Sheath was then withdrawn into the external iliac artery. Balloon angioplasty was performed of the pre-existing right common iliac artery stent with 5 mm x 40 mm standard balloon angioplasty. Balloon was removed and the  introducer was then replaced. Eight French sheath was then again attempted to pass over the wire, unsuccessful through this segment of the iliac artery. The introducer was removed, diagnostic catheter was placed into the cervical segment of the internal carotid artery, and the Rose an wire was exchanged for a 260 cm Amplatz wire. With the stiff wire in place, another attempt was made to pass the sheath which was unsuccessful. Sheath was then again withdrawn to the external iliac artery, introducer was removed, and a second balloon angioplasty 6 mm x 40 mm was performed at the pre-existing stent. The sheath was then successfully advanced over the balloon as the balloon was deflated, into the abdominal aorta. The introducer was again placed after removing the balloon and the sheath was advanced 6 successfully into the aorta. Introducer was withdrawn. Once the sheath was advanced over the wire to the thoracic aorta, sheath was flushed and attached to pressurized and heparinized saline bag for constant forward flow. Eight French 95 cm flow gate balloon catheter was then advanced over the wire to the distal cervical segment. Wire was removed. Then a coaxial intermediate catheter and microcatheter combination was prepared on the back table. This combination was penumbra Ace 64 catheter and a Trevo Provue18 microcatheter, with a synchro soft wire. This combination was then advanced through the balloon guide into the ICA. System was advanced into the internal carotid artery, to the level of the occlusion. The micro wire was then carefully advanced through the occluded segment, with a distal knuckle configuration. Microcatheter was then pushed through the occluded segment and the wire was removed. Intermediate catheter was advanced over the micro wire to the carotid siphon, which would not advance beyond the ophthalmic segment through the terminus. Blood was then aspirated through the hub of the microcatheter, and a gentle  contrast injection  was performed confirming intraluminal position. A rotating hemostatic valve was then attached to the back end of the microcatheter, and a pressurized and heparinized saline bag was attached to the catheter. 4 mm x 40 mm solitaire device was then selected. Back flush was achieved at the rotating hemostatic valve, and then the device was gently advanced through the microcatheter to the distal end. The retriever was then unsheathed by withdrawing the microcatheter under fluoroscopy. Once the retriever was completely unsheathed, the microcatheter was carefully stripped from the delivery wire of the device. Control angiogram was then performed from the balloon catheter. 3 minute time interval was observed. The balloon on the balloon guide was then inflated to profile of the vessel. Constant aspiration was then performed through the intermediate catheter, and constant gentle aspiration was performed at the balloon guide. This aspiration was continued as the retriever was gently and slowly withdrawn with fluoroscopic observation into the distal intermediate catheter. The entire system was then gently withdrawn from the intracranial ICA and into the balloon guide. Once the retriever was entirely removed from the system, free aspiration was confirmed at the hub of the balloon guide, with free blood return confirmed. Control angiogram was then performed. TICI 2a flow was achieved. Repeat angiogram with multiple angulation demonstrated persistent occlusion of parietal branch. The same coaxial system was again passed through the balloon guide catheter. The microcatheter system was then advanced through the occlusion of the parietal branch. Once the micro wire microcatheter were beyond the occlusion, the micro wire was removed and stentreiver device was deployed, stripping the microcatheter from the wire. After the device was deployed across the occlusion, the intermediate catheter was again attempted to  advanced to the M1 segment. This was unsuccessful, and would not advance beyond the ophthalmic segment. The balloon at the balloon guide was inflated to profile of the vessel. Local aspiration was performed at the intermediate catheter upon withdrawal of the device under fluoroscopic observation, with aspiration at the balloon guide. Once the retrieve her and intermediate catheter were entirely removed from the system, free aspiration was confirmed at the hub of the intermediate catheter, with free blood return confirmed. Control angiogram was again performed. Improved flow was confirmed, TICI 2b. Balloon guide was withdrawn and angiogram of the cervical segment was performed. We then removed the balloon guide catheter and deployed an 8 French Angio-Seal at the right common femoral artery access. Angiogram was performed of the right sided access and the left-sided access before completing the case. Patient tolerated the procedure well and remained hemodynamically stable throughout. No complications were encountered. Estimated blood loss approximately 50 cc. IMPRESSION: Status post ultrasound guided access right common femoral artery for cerebral angiogram and a combination of aspiration and mechanical thrombectomy of left M1 emergent large vessel occlusion, and restoration of TICI 2b flow after 2 passes. Status post balloon angioplasty to 6 mm of proximal right common iliac artery for treatment of a restrictive lesion in order to pass 8 French sheath to perform the thrombectomy. Status post deployment of Angio-Seal at the right common femoral artery. At the conclusion of the case, a 4 [place] sheath remains in left common femoral artery for arterial monitoring. Signed, Dulcy Fanny. Dellia Nims, RPVI [facility] Radiology PLAN: Formal CT after the case Right hip straight overnight status post 8 French device and Angio-Seal closure Left common femoral artery for French sheath  may be removed when hemodynamic monitoring is no longer needed with manual pressure. Goal blood pressure  147 [id number] systolic given the incomplete flow restoration Electronically Signed   By: Corrie Mckusick D.O.   On: 03/12/2018 20:39   Portable Chest X-ray  Result Date: 03/30/2018 CLINICAL DATA:  Intubated EXAM: PORTABLE CHEST 1 VIEW COMPARISON:  06/01/2017 chest radiograph. FINDINGS: Endotracheal tube tip is 4.2 cm above the carina. Enteric tube terminates at the esophagogastric junction with the side port in the lower thoracic esophagus. Stable cardiomediastinal silhouette with moderate cardiomegaly. No pneumothorax. No pleural effusion. Hazy lower parahilar lung opacities, most prominent in the right lower lung, worsened. IMPRESSION: 1. Well-positioned endotracheal tube. 2. Enteric tube terminates at the esophagogastric junction with the side port in the lower thoracic esophagus, consider advancing 8-10 cm. 3. Moderate cardiomegaly. Worsening hazy lower parahilar lung opacities, most prominent in the right lower lung, favor pulmonary edema. Electronically Signed   By: Ilona Sorrel M.D.   On: 03/21/2018 22:12   Ir Percutaneous Art Thrombectomy/infusion Intracranial Inc Diag Angio  Result Date: 04/01/2018 INDICATION: 73 year old female with a history of acute left MCA stroke. She presents within time frame for IV tPA, and is a candidate for mechanical thrombectomy EXAM: ULTRASOUND GUIDED ACCESS RIGHT COMMON FEMORAL ARTERY ULTRASOUND GUIDED ACCESS LEFT COMMON FEMORAL ARTERY FOR ARTERIAL MONITORING ANGIOGRAM RIGHT LOWER EXTREMITY BALLOON ANGIOPLASTY STENOSIS RIGHT COMMON ILIAC ARTERY IN ORDER TO PASS 8 FRENCH SHEATH TO COMPLETE THE THROMBECTOMY CEREBRAL ANGIOGRAM MECHANICAL THROMBECTOMY LEFT MCA EMERGENT LARGE VESSEL OCCLUSION DEPLOYMENT OF ANGIO-SEAL RIGHT COMMON FEMORAL ARTERY COMPARISON:  CT imaging of the same day MEDICATIONS: 2 g Ancef IV. The antibiotic was administered within 1 hour of the procedure  ANESTHESIA/SEDATION: General endotracheal tube anesthesia CONTRAST:  96 cc Isovue-300 FLUOROSCOPY TIME:  Fluoroscopy Time: 26 minutes 24 seconds (1,362 mGy). COMPLICATIONS: None TECHNIQUE: Informed written consent was obtained from the patient's family after a thorough discussion of the procedural risks, benefits and alternatives. Specific risks discussed include: Bleeding, infection, contrast reaction, kidney injury/failure, need for further procedure/surgery, arterial injury or dissection, embolization to new territory, intracranial hemorrhage (10-15% risk), neurologic deterioration, cardiopulmonary collapse, death. All questions were addressed. Maximal Sterile Barrier Technique was utilized including during the procedure including caps, mask, sterile gowns, sterile gloves, sterile drape, hand hygiene and skin antiseptic. A timeout was performed prior to the initiation of the procedure. The anesthesia team was present to provide general endotracheal tube anesthesia and for patient monitoring during the procedure. Interventional neuro radiology nursing staff was also present. 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 Surgical changes of prior endarterectomy evident on prior CT imaging. Left external carotid artery: Patent with antegrade flow. Left internal carotid artery: Normal course caliber and contour of the cervical portion. Vertical and petrous segment patent with normal course caliber contour. Cavernous segment patent. Clinoid segment patent. Antegrade flow of the ophthalmic artery. Ophthalmic segment patent. Terminus patent. Left MCA: Distal M1 occluded at the initiation of the case. There is partial filling of an inferior branch which is the non dominant branch to the frontal. Patent fetal PCA to the left P2 segment. Left ACA: A 1 segment patent. A 2 segment perfuses the right territory.  Completion Findings: Left MCA: After 2 passes of mechanical thrombectomy plus aspiration, there is near complete restoration of flow through the left MCA territory, with slow flow through an M3/M4 branch of the parietal region compatible with TI CI 2b flow. 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 Physical exam At  the initiation of the case, palpable bilateral common femoral artery pulses present No palpable distal pulses Doppler positive pulses of the right posterior tibial and anterior tibial (with systolic blood pressures above 200) Doppler positive pulse at the left posterior tibial (with systolic blood pressure above 200), with no Doppler signal at the left anterior tibial At completion of case Doppler positive pulses were only discovered on the right posterior tibial and anterior tibial arteries with blood pressures above 200. In the 140-160 range (goal) Doppler signal was not evident A completion of case Doppler positive pulses were only discovered on the left at the posterior tibial and not the left anterior tibial (with the pressure above 726 systolic). PROCEDURE: Patient is brought emergently to the neuro angiography suite, with the patient identified appropriately and placed supine position on the table. Left radial arterial line was attempted by the anesthesia team. The patient is then prepped and draped in the usual sterile fashion. Ultrasound survey of the right inguinal region was performed with images stored and sent to PACs. 11 blade scalpel was used to make a small incision. Blunt dissection was performed. A micropuncture needle was used access the right common femoral artery under ultrasound. With excellent arterial blood flow returned, an .018 micro wire was passed through the needle, observed to enter the abdominal aorta under fluoroscopy. The needle was removed, and a micropuncture sheath was placed over the wire. The inner dilator and wire were  removed, and an 035 Bentson wire was advanced under fluoroscopy into the abdominal aorta. The sheath was removed and a standard 5 [place] vascular sheath was placed. The dilator was removed and the sheath was flushed. Ultrasound survey of the left inguinal region was then performed with images stored and sent to PACs, confirming patency of the vessel. A micropuncture needle was used access the left common femoral artery under ultrasound. With excellent arterial blood flow returned, and an .018 micro wire was passed through the needle, observed enter the abdominal aorta under fluoroscopy. The needle was removed, and a micropuncture sheath was placed over the wire. The inner dilator and wire were removed, and an 035 Bentson wire was advanced under fluoroscopy into the abdominal aorta. The sheath was removed and a standard 4 [place] vascular sheath was placed for arterial monitoring. The dilator was removed and the sheath was flushed. A 106F JB-1 diagnostic catheter was then advanced over the wire through the existing right femoral access to the proximal descending thoracic aorta. Wire was then removed. Double flush of the catheter was performed. Catheter was then used to select the left cervical internal carotid artery. Formal angiogram was performed, with roadmap achieved. Exchange length Rosen wire was then passed through the diagnostic catheter to the distal cervical ICA and the diagnostic catheter was removed. The 5 French sheath was removed, with attempt at placing 8 French 55 cm bright tip sheath. Significant resistance was encountered with passing the sheath through the right iliac system. The sheath would not advanced through the pre-existing right common iliac artery stent. Sheath was withdrawn and rotated. We attempted to advance the sheath over the introducer which was pinned on the wire. We also removed the introducer in place to diagnostic catheter into the aorta and attempted to pass the sheath over the  diagnostic catheter. None of these maneuvers were successful. Sheath was then withdrawn into the external iliac artery. Balloon angioplasty was performed of the pre-existing right common iliac artery stent with 5 mm x 40 mm standard balloon angioplasty. Balloon  was removed and the introducer was then replaced. Eight French sheath was then again attempted to pass over the wire, unsuccessful through this segment of the iliac artery. The introducer was removed, diagnostic catheter was placed into the cervical segment of the internal carotid artery, and the Rose an wire was exchanged for a 260 cm Amplatz wire. With the stiff wire in place, another attempt was made to pass the sheath which was unsuccessful. Sheath was then again withdrawn to the external iliac artery, introducer was removed, and a second balloon angioplasty 6 mm x 40 mm was performed at the pre-existing stent. The sheath was then successfully advanced over the balloon as the balloon was deflated, into the abdominal aorta. The introducer was again placed after removing the balloon and the sheath was advanced 6 successfully into the aorta. Introducer was withdrawn. Once the sheath was advanced over the wire to the thoracic aorta, sheath was flushed and attached to pressurized and heparinized saline bag for constant forward flow. Eight French 95 cm flow gate balloon catheter was then advanced over the wire to the distal cervical segment. Wire was removed. Then a coaxial intermediate catheter and microcatheter combination was prepared on the back table. This combination was penumbra Ace 64 catheter and a Trevo Provue18 microcatheter, with a synchro soft wire. This combination was then advanced through the balloon guide into the ICA. System was advanced into the internal carotid artery, to the level of the occlusion. The micro wire was then carefully advanced through the occluded segment, with a distal knuckle configuration. Microcatheter was then pushed  through the occluded segment and the wire was removed. Intermediate catheter was advanced over the micro wire to the carotid siphon, which would not advance beyond the ophthalmic segment through the terminus. Blood was then aspirated through the hub of the microcatheter, and a gentle contrast injection was performed confirming intraluminal position. A rotating hemostatic valve was then attached to the back end of the microcatheter, and a pressurized and heparinized saline bag was attached to the catheter. 4 mm x 40 mm solitaire device was then selected. Back flush was achieved at the rotating hemostatic valve, and then the device was gently advanced through the microcatheter to the distal end. The retriever was then unsheathed by withdrawing the microcatheter under fluoroscopy. Once the retriever was completely unsheathed, the microcatheter was carefully stripped from the delivery wire of the device. Control angiogram was then performed from the balloon catheter. 3 minute time interval was observed. The balloon on the balloon guide was then inflated to profile of the vessel. Constant aspiration was then performed through the intermediate catheter, and constant gentle aspiration was performed at the balloon guide. This aspiration was continued as the retriever was gently and slowly withdrawn with fluoroscopic observation into the distal intermediate catheter. The entire system was then gently withdrawn from the intracranial ICA and into the balloon guide. Once the retriever was entirely removed from the system, free aspiration was confirmed at the hub of the balloon guide, with free blood return confirmed. Control angiogram was then performed. TICI 2a flow was achieved. Repeat angiogram with multiple angulation demonstrated persistent occlusion of parietal branch. The same coaxial system was again passed through the balloon guide catheter. The microcatheter system was then advanced through the occlusion of the  parietal branch. Once the micro wire microcatheter were beyond the occlusion, the micro wire was removed and stentreiver device was deployed, stripping the microcatheter from the wire. After the device was deployed across the occlusion,  the intermediate catheter was again attempted to advanced to the M1 segment. This was unsuccessful, and would not advance beyond the ophthalmic segment. The balloon at the balloon guide was inflated to profile of the vessel. Local aspiration was performed at the intermediate catheter upon withdrawal of the device under fluoroscopic observation, with aspiration at the balloon guide. Once the retrieve her and intermediate catheter were entirely removed from the system, free aspiration was confirmed at the hub of the intermediate catheter, with free blood return confirmed. Control angiogram was again performed. Improved flow was confirmed, TICI 2b. Balloon guide was withdrawn and angiogram of the cervical segment was performed. We then removed the balloon guide catheter and deployed an 8 French Angio-Seal at the right common femoral artery access. Angiogram was performed of the right sided access and the left-sided access before completing the case. Patient tolerated the procedure well and remained hemodynamically stable throughout. No complications were encountered. Estimated blood loss approximately 50 cc. IMPRESSION: Status post ultrasound guided access right common femoral artery for cerebral angiogram and a combination of aspiration and mechanical thrombectomy of left M1 emergent large vessel occlusion, and restoration of TICI 2b flow after 2 passes. Status post balloon angioplasty to 6 mm of proximal right common iliac artery for treatment of a restrictive lesion in order to pass 8 French sheath to perform the thrombectomy. Status post deployment of Angio-Seal at the right common femoral artery. At the conclusion of the case, a 4 [place] sheath remains in left common femoral artery  for arterial monitoring. Signed, Dulcy Fanny. Dellia Nims, RPVI [facility] Radiology PLAN: Formal CT after the case Right hip straight overnight status post 8 French device and Angio-Seal closure Left common femoral artery for French sheath may be removed when hemodynamic monitoring is no longer needed with manual pressure. Goal blood pressure 235 [id number] systolic given the incomplete flow restoration Electronically Signed   By: Corrie Mckusick D.O.   On: 03/28/2018 20:39   Ct Head Code Stroke Wo Contrast  Result Date: 03/30/2018 CLINICAL DATA:  Code stroke.  Deficit not specified EXAM: CT HEAD WITHOUT CONTRAST TECHNIQUE: Contiguous axial images were obtained from the base of the skull through the vertex without intravenous contrast. COMPARISON:  Head CT 07/18/2016 and brain MRI 07/20/2016 FINDINGS: Brain: Multiple remote lacunar infarcts in the deep white matter and deep gray nuclei, chronic when compared to prior head CT and brain MRI. Small remote left cerebellar infarct. No evidence of acute infarct. No hemorrhage or hydrocephalus. Vascular: No hyperdense vessel. Skull: Normal. Negative for fracture or focal lesion. Sinuses/Orbits: No acute finding. Other: These results were communicated to Dr. Rory Percy at 4:43 pmon 01/28/2020by text page via the [facility] messaging system. ASPECTS [facility] Stroke Program Early CT Score) Not scored without localizing symptoms. Motion artifact at the vertex blurring cortex. IMPRESSION: 1. No acute finding. 2. Motion artifact at the vertex. 3. Multiple remote small vessel infarcts. Electronically Signed   By: Monte Fantasia M.D.   On: 03/09/2018 16:45    PHYSICAL EXAM  Temp:  [96.5 F (35.8 C)-98.5 F (36.9 C)] 98.5 F (36.9 C) (01/02 0800) Pulse Rate:  [65-108] 75 (01/02 1000) Resp:  [16-28] 16 (01/02 1000) BP: (83-126)/(48-95) 83/60 (01/01 2115) SpO2:  [96 %-100 %] 100 % (01/02 1000) Arterial Line BP: (109-221)/(36-90)  168/55 (01/02 1000) FiO2 (%):  [30 %-100 %] 30 % (01/02 0837) Weight:  [77.7 kg] 77.7 kg (01/01 1600)  General - Well nourished, well developed, intubated on  sedation.  Ophthalmologic - fundi not visualized due to noncooperation.  Cardiovascular - Regular rate and rhythm.  Neuro - intubated on sedation, eyes slightly open on voice stimulation.  Not following commands.  Eyes mid position, no deviation, however no doll's eyes.  Not blinking to visual threat bilaterally.  Pupil 2 mm bilaterally, reactive to light.  Left corneal present, right corneal weak reflexes.  Positive gag and cough.  Facial symmetry not able to test due to ET tube.  Left upper and lower extremity 2+/5 on pain stimulation.  Right upper and lower extremity mild withdrawal to pain summation.  DTR 1+, Babinski negative. Sensation, coordination and gait not tested.   ASSESSMENT/PLAN [name] is a 73 y.o. female with history of stroke, hypertension, PVD, diabetes, hyperlipidemia, status post right CEA, CHF admitted for right-sided weakness and aphasia. TPA given.    Stroke:  right and left MCA infarct due to left M1/M2 occlusion status post TPA and IR wheeze TICI2b reperfusion, showed likely due to large vessel atherosclerosis and stenosis.  However, cardioembolic source cannot be excluded.  Resultant intubated on sedation, right hemiparesis  CT showed multiple old lacunar infarcts  CTA head and neck left M1/M2 occlusion.  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  Hypoplastic posterior circulation with bilateral fetal PCAs  CT repeat post procedure unremarkable  MRI/MRA pending  2D Echo EF 30 to 35% down from 40 to 45% in 05/2017  LE venous Doppler pending  LDL 92  HgbA1c 7.1  SCDs for VTE prophylaxis  aspirin 81 mg daily and clopidogrel 75 mg daily prior to admission, now on No antithrombotic within 24 hours of TPA  Ongoing aggressive stroke risk  factor management  Therapy recommendations: pending  Disposition: Pending  Multi-large vessel severe atherosclerosis and stenosis  CTA head and neck showed bilateral siphon, right ICA and a better subclavian high-grade stenosis  Hypoplastic posterior circulation with bilateral fetal PCAs  History of left CEA  History of PVD  History of CAD, following with Dr. Einar Gip as outpatient  If patient has reasonable neurological functional outcome, may consider outpatient follow-up with vascular surgery for subclavian artery stenting  Non-STEMI and cardiomyopathy  Troponin 4.62-6.47-5.08  Continue cycling troponin  EF 30 to 35% down from 40 to 45% in 05/2017  Cardiology on board  No intervention recommended  We will start antiplatelet after MRI if no brain bleed.  Taper off nitroglycerin IV  Diabetes  HgbA1c 7.1 goal < 7.0  Uncontrolled  CBG monitoring  SSI  Hypertension . Stable . On Cleviprex  BP goal 140-160 post procedure  BP monitoring by left femoral A-line.  Patient BP monitoring difficult in arms given bilateral severe subclavian artery stenosis  Hyperlipidemia  Home meds: Lipitor 80  LDL 92, goal < 70  Now on Lipitor 80  Continue statin at discharge  Other Stroke Risk Factors  Advanced age  Former cigarette smoker, quit smoking 43 years ago  Limit ETOH use  Hx stroke/TIA -09/2011 left pontine infarct, LDL 115 and A1c 8.3.  Put on DAPT and Lipitor 80  Other Active Problems  Hypokalemia-supplement  Hyperglycemia  AKI on CKD stage III -creatinine 2.20-2.25, continue IV fluid  Hospital day # 1  This patient is critically ill due to left MCA stroke, severe multivessel atherosclerosis and stenosis, CHF, non-STEMI, hyperglycemia and at significant risk of neurological worsening, death form recurrent stroke, hemorrhagic conversion, heart failure, seizure, DKA. This patient's care requires constant monitoring  of vital signs, hemodynamics,  respiratory and cardiac monitoring, review of multiple databases, neurological assessment, discussion with family, other specialists and medical decision making of high complexity. I spent 40 minutes of neurocritical care time in the care of this patient. I had long discussion with 2 daughters at bedside, updated pt current condition, treatment plan and potential prognosis. They expressed understanding and appreciation.  I also had discussed with CCM NP [place].   Rosalin Hawking, MD PhD Stroke Neurology 03/09/2018 10:17 AM    To contact Stroke Continuity provider, please refer to [email or web address] After hours, contact General Neurology

## 2018-03-09 NOTE — Progress Notes (Signed)
OT Cancellation Note  Patient Details Name: Yvonne Patel MRN: [id number] DOB: 01/29/1946   Cancelled Treatment:    Reason Eval/Treat Not Completed: Patient not medically ready.  Pt on bedrest and intubated.  Lucille Passy, OTR/L Acute Rehabilitation Services Pager [phone] Office [place], [place] 03/09/2018, 9:35 AM

## 2018-03-09 NOTE — Progress Notes (Signed)
NAME:  Yvonne Patel, MRN:  [id number], DOB:  1946-02-16, LOS: 1 ADMISSION DATE:  04/03/2018, CONSULTATION DATE:  1/1 REFERRING MD:  Dr. Rory Percy , CHIEF COMPLAINT:  CVA   Brief History   73 year old female admitted with acute CVA s/p systemic TPA and mechanical thrombolysis.   Past Medical History   has a past medical history of Cardiomyopathy ([place]), Coronary artery disease, Hyperlipidemia, Hypertension, Neuromuscular disorder ([place]), PAD (peripheral artery disease) ([place]), Peripheral vascular disease ([place]), Refusal of blood transfusions as patient is Jehovah's Witness, Retinal disease, Syncope, TIA (transient ischemic attack) (2010; 09/2011), and Type II diabetes mellitus ([place]).  Significant Hospital Events   1/1 admit  Consults:  cardiology  Procedures:  1/1: VIR procedure completed for left MCA occlusion at M1 segment. The procedure involved two passes of combination mechanical and aspiration thrombectomy with restoration of TICI 2b flow. 1/1 left femoral art line >> 1/1 ETT >>  Significant Diagnostic Tests:  CT head 1/1 > no acute finding, multiple remote small vessel infarcts CTA head/neck 1/1> Left M2 branch occlusion beginning at the M1 bifurcation. Severe atherosclerosis which preferentially spares the left carotid circulation, question prior endarterectomy. Severe atheromatous narrowing if not occlusion of bilateral proximal subclavian arteries. There is bilateral fetal type PCA with diffuse thready flow in the posterior fossa. 70% atheromatous narrowing of the right common carotid and ICA bulb. CT head 1/1 (post IR) > New 3 mm high-density within the posterior right frontal cortex which could be focus of hemorrhage or an enhancing subacute infarct. No hemorrhage or infarct seen along the postoperative left MCA distribution.  TTE 1/1 >> - Left ventricle: The cavity size was mildly dilated. There was severe concentric hypertrophy. Systolic function was moderately to severely reduced.  The estimated ejection fraction was in the range of 30% to 35%. Mod global and severe inferior /inferolateral hypokinesis. Diastolic function assessment limited due to severe MAC. - Aortic valve: Moderately calcified annulus. Trileaflet; moderately calcified leaflets. Echodense projection seen off right aortic cusp on aortic side, not independently mobile. Seen partly on previous study in 05/2017. This most likely represents calcification or healed vegetation. There was trace stenosis. - Mitral valve: Severely thickened annulus. Severely calcified leaflets . Inadequate Doppler assessment to evaluate for mitral stenosis. Visually, appears to be mild calcific MS. - Left atrium: The atrium was severely dilated. - Pericardium, extracardiac: Trace posterior pericardial effusion. - Compared to previous study in 05/2017, LVEF is mildly reduced. Valvular calcifications and atheromatous calcifications in aortic root more prominently seen.  Micro Data:  1/1 MRSA PCR  >> neg  Antimicrobials:  1/1 ancef preop  Interim history/subjective:  Cardiology consulted overnight for CP and positive troponin's  Remains on propofol 30 mcg/kg/min- withdrawals to pain, decreased in RUE  nitro gtt 50 mcg/min, cleviprex 68m/hr- still has R art line, unable to obtain accurate cuff pressures MRI/ MRA scheduled for 3pm  Objective   Blood pressure (!) 83/60, pulse (!) 106, temperature 97.9 F (36.6 C), temperature source Axillary, resp. rate 16, weight 77.7 kg, SpO2 100 %.    Vent Mode: PRVC FiO2 (%):  [30 %-100 %] 30 % Set Rate:  [16 bmp] 16 bmp Vt Set:  [450 mL] 450 mL PEEP:  [5 cmH20] 5 cmH20 Plateau Pressure:  [16 cmH20-17 cmH20] 16 cmH20   Intake/Output Summary (Last 24 hours) at 03/09/2018 0840 Last data filed at 03/09/2018 0800 Gross per 24 hour  Intake 2030.31 ml  Output 725 ml  Net 1305.31 ml   FDanley Danker  Weights   03/28/2018 1600  Weight: 77.7 kg    Examination: General:  Critically ill elderly female  lying in bed in NAD HEENT: MM pink/dry, pupils 5/reactive, ETT 7.5 at 22, OGT Neuro: moves LUE/ LLE/ RLE spont, minimal withdrawal in RUE to pain CV: RR, slight murmur, only distal palpable pulse is L DP, all others require doppler PULM: even/non-labored on full MV, lungs bilaterally clear JK:KXFG, ND, BS +  Extremities: warm/dry, no LE edema Skin: no rashes lesions  Resolved Hospital Problem list     Assessment & Plan:   Acute CVA:  Left M1/M2 distribution. S/p systemic TPA 1/1 at 1700, and Neuro IR thrombectomy.  - Plan per stroke service and neuro IR. - SBP goal 140 - 160 mmHg - Clevidipine as needed for BP goals.  - Full vent support with propofol for sedation. RASS goal 0 to -1 - VAP bundle   Acute respiratory insufficency related to above  P:  Continue full MV support PRVC 8 cc/kg SBT trials- hold extubation till after MRI/ MRA Trend CXR/ ABG  Hypertension PAD - BP goals/medications as above - Monitor distal pulses, Bilateral lower extremity pulses have been obtainable only when she is very hypertensive even before IR case.  - will leave R groin art line for now given cuff pressures unreliable   Chest pain NSTEMI - troponin  peaked Systolic HF - TTE 1/1 with EF 30-35% - cardiology following - s/p TPA 1/1 at 1700,  recommendations for DAPT w/ ASA and plavix and IV heparin for 48-72 hrs when deemed safe by neurology - lipitor 80 mg daily  - continue nitroglycerine infusion  DM2: -CBG monitoring and SSI  AKI on CKD: Baseline creat 1.6 - Gentle hydration -repeat renal panel and mag now - trend UOP  Hypokalemia - pending BMP/ mag   Best practice:  Diet: NPO Pain/Anxiety/Delirium protocol (if indicated): propofol RASS goal 0 to -1 VAP protocol (if indicated): per protocol DVT prophylaxis: on hold for now after tpa GI prophylaxis: protonix Glucose control: ssi Mobility: BR Code Status: Full Family Communication: daughter updated at bedside on plan of  care Disposition: ICU  Labs   CBC: Recent Labs  Lab 03/28/2018 1642 03/10/2018 1656  WBC  --  6.3  NEUTROABS  --  3.2  HGB 13.9 12.2  HCT 41.0 38.5  MCV  --  94.1  PLT  --  182    Basic Metabolic Panel: Recent Labs  Lab 03/14/2018 1642 04/05/2018 1656  NA 138 140  K 3.8 3.3*  CL 102 101  CO2  --  25  GLUCOSE 180* 191*  BUN 32* 32*  CREATININE 2.20* 2.25*  CALCIUM  --  9.3   GFR: CrCl cannot be calculated (Unknown ideal weight.). Recent Labs  Lab 03/31/2018 1656  WBC 6.3    Liver Function Tests: Recent Labs  Lab 04/02/2018 1656  AST 27  ALT 26  ALKPHOS 97  BILITOT 0.7  PROT 7.4  ALBUMIN 3.0*   No results for input(s): LIPASE, AMYLASE in the last 168 hours. No results for input(s): AMMONIA in the last 168 hours.  ABG    Component Value Date/Time   PHART 7.486 (H) 03/29/2018 2144   PCO2ART 37.5 03/20/2018 2144   PO2ART 451.0 (H) 04/06/2018 2144   HCO3 28.6 (H) 03/10/2018 2144   TCO2 30 03/14/2018 2144   O2SAT 100.0 04/05/2018 2144     Coagulation Profile: Recent Labs  Lab 03/15/2018 1656  INR 1.00    Cardiac  Enzymes: Recent Labs  Lab 04/03/2018 1831 03/09/18 0019 03/09/18 0432  TROPONINI 4.62* 6.47* 5.08*    HbA1C: Hgb A1c MFr Bld  Date/Time Value Ref Range Status  03/09/2018 04:32 AM 7.1 (H) 4.8 - 5.6 % Final    Comment:    (NOTE) Pre diabetes:          5.7%-6.4% Diabetes:              >6.4% Glycemic control for   <7.0% adults with diabetes   05/31/2017 03:59 PM 6.7 (H) 4.8 - 5.6 % Final    Comment:    (NOTE) Pre diabetes:          5.7%-6.4% Diabetes:              >6.4% Glycemic control for   <7.0% adults with diabetes     CBG: Recent Labs  Lab 03/16/2018 1631  GLUCAP 175*    Critical care time: 73 mins     Kennieth Rad, AGACNP-BC [facility] Pgr: [phone] or if no answer [phone] 03/09/2018, 9:55 AM

## 2018-03-09 NOTE — Progress Notes (Signed)
Subjective:  Remains intubated, sedated.   Objective:  Vital Signs in the last 24 hours: Temp:  [97.6 F (36.4 C)-100.8 F (38.2 C)] 100.8 F (38.2 C) (01/02 2000) Pulse Rate:  [65-110] 83 (01/02 2100) Resp:  [16-22] 16 (01/02 2100) SpO2:  [94 %-100 %] 100 % (01/02 2100) Arterial Line BP: (109-183)/(36-75) 173/50 (01/02 2100) FiO2 (%):  [30 %-40 %] 30 % (01/02 2100)  Intake/Output from previous day: 01/01 0701 - 01/02 0700 In: 1938.9 [I.V.:1938.9] Out: 725 [Urine:675; Blood:50] Intake/Output from this shift: Total I/O In: 63.2 [I.V.:63.2] Out: -   Physical Exam: Nursing note and vitals reviewed. Constitutional: She appears well-developed and well-nourished.  Intubated and sedated  HENT:  Head: Normocephalic and atraumatic.  Eyes: Pupils are equal, round, and reactive to light.  Neck: No JVD present.  Cardiovascular: S1 normal and S2 normal.  Murmur heard.  Crescendo systolic murmur is present with a grade of 2/6.  Diastolic (Mitral area) murmur is present with a grade of 1/6. Pulses:      Carotid pulses are on the right side with bruit and on the left side with bruit.      Femoral pulses are 1+ on the right side and 1+ on the left side.      Popliteal pulses are 0 on the right side and 0 on the left side.       Dorsalis pedis pulses are 0 on the right side and 0 on the left side.       Posterior tibial pulses are 0 on the right side and 0 on the left side.  Lymphadenopathy:    She has no cervical adenopathy.    Lab Results: Recent Labs    04/03/2018 1656 03/09/18 1040  WBC 6.3 6.6  HGB 12.2 9.5*  PLT 174 150   Recent Labs    04/03/2018 1656 03/09/18 1055  NA 140 139  K 3.3* 3.6  CL 101 107  CO2 25 21*  GLUCOSE 191* 174*  BUN 32* 25*  CREATININE 2.25* 2.06*   Recent Labs    03/09/18 1040 03/09/18 1526  TROPONINI 4.70* 3.81*   Hepatic Function Panel Recent Labs    03/31/2018 1656 03/09/18 1055  PROT 7.4  --   ALBUMIN 3.0* 2.1*  AST 27  --    ALT 26  --   ALKPHOS 97  --   BILITOT 0.7  --    Recent Labs    03/09/18 0432  CHOL 141   Cardiac studies: EKG 03/24/2018: Sinus or ectopic atrial tachycardia. Left atrial enlargement. IVCD. Nonspecific lateral ST-T changes.  Other than rate increase, no changes compated to previous EKG in 05/2017.  Hospital echocardiogram 03/24/2018: - Left ventricle: The cavity size was mildly dilated. There was severe concentric hypertrophy. Systolic function was moderately to severely reduced. The estimated ejection fraction was in the range of 30% to 35%. Mod global and severe inferior/inferolateral hypokinesis. Diastolic function assessment limited due to severe MAC. - Aortic valve: Moderately calcified annulus. Trileaflet; moderately calcified leaflets. Echodense projection seen off right aortic cusp on aortic side, not independently mobile. Seen partly on previous study in 05/2017. This most likely represents calcification or healed vegetation. There was trace stenosis. - Mitral valve: Severely thickened annulus. Severely calcified leaflets . Inadequate Doppler assessment to evaluate for mitral stenosis. Visually, appears to be mild calcific MS. - Left atrium: The atrium was severely dilated. - Pericardium, extracardiac: Trace posterior pericardial effusion. - Compared to previous study in 05/2017, LVEF is mildly  reduced. Valvular calcifications and atheromatous calcifications in aortic root more prominently seen.  Cath 06/22/2016:  Ost RCA lesion, 100 %stenosed.  Ost 1st Diag to 1st Diag lesion, 80 %stenosed.  Ost 2nd Mrg to 2nd Mrg lesion, 80 %stenosed.  There is mild left ventricular systolic dysfunction.  LV end diastolic pressure is normal.  The left ventricular ejection fraction is 35-45% by visual estimate.   Cerebral angiography 06/22/2016: Arch aortogram reveals type A arch, bilateral vertebral artery is occluded. Right subclavian artery  with a high-grade and complex calcified 90% stenosis at its origin followed by ectasia and a tandem 90% stenosis. Right vertebral occluded. Right ICA calcific 60-70% stenosis. Left ICA complex calcified 90% stenosis.  Bilateral selective renal angiogram: Widely patent renal artery stents, left kidney appears shrunken.  Coronary angiography 06/22/2016: LVEF 40-45% with diffuse hypokinesis, marked LVH, normal LVEDP. RCA dominant and is occluded in the proximal segment with bridging collaterals. There is also contralateral collaterals. Left main, LAD calcific disease without high-grade stenosis, diagonal 1 with a ostial 80% stenosis. Circumflex coronary artery OM1 large with a ostial 90% stenosis.  Complication: There was no immediate complication, however patient developed chest pain, fatigue and not feeling well when we reduced her initial resenting blood pressure from 300/60 mmHg down to 150/45 mmHg. Patient received atropine to improve blood pressure, patient immediately felt well.  Recommendation: Extremely complex situation with a high-grade stenosis of left ICA and diffuse coronary artery disease, cardiomyopathy probably related to hypertension with hypertensive heart disease then coronary artery disease. Would recommend medical therapy for CAD. I will discuss with Dr. Oneida Alar regarding revascularization strategy of the left carotid artery. 100 mL of contrast utilized.  Assessment/Recommednations:  73 year old [name] with severe atherosclerotic cardiovascular disease with medically managed diffuse CAD, severe PAD including prior embolic CVA, b/l renal artery stenting, Rt CIA stenting, h/o left carotid endarterctomy, b/l subclavian artery stenosis, Jehovah's witness, type 2 DM, hypertension, now with left acute left MCA CVA, treated with tPA and mechanical thrombectomy. Cardiology consulted for troponin elevation.  NSTEMI: Medical management with DAPT and heparin, if and when  deemed safe by Neurology. Severe vasculapath with periprocedural risk. Risks of invasive management outweighs benefits in patient with recent stroke. Lipitor 80 mg daily. Low dose beta blocker when permissive hypertension period is over. New brain MRI findings of additional predominantly subcentimeter acute infarcts in the cerebral and cerebellar hemispheres bilaterally, and 4 mm acute hemorrhage in the right precentral gyrus noted. This may further preclude use of DAPT/heparin.   HFrEF: EF mildly reduced compared to baseline. Will consider heart failure medical therapy when permissive hypertension period is over.  Lasix 40 mg administered today.   CVA: Management as per neurology.   AKI/CKD 3: Management per the primary team.  Overall prognosis critical. Conservative management plan discussed at bedside and over the phone with patient's daughters [place].      LOS: 1 day    [place] 03/09/2018, 9:48 PM  Thimothy Barretta Esther Hardy, MD [facility] Pager: [phone] Office: [phone] If no answer Cell [phone]

## 2018-03-09 NOTE — Progress Notes (Signed)
*  Preliminary Results* Bilateral lower extremity venous duplex completed. Bilateral lower extremities are negative for deep vein thrombosis. There is no evidence of Baker's cyst bilaterally.  03/09/2018 12:27 PM Maudry Mayhew, MHA, RVT, RDCS, RDMS

## 2018-03-09 NOTE — Progress Notes (Signed)
SLP Cancellation Note  Patient Details Name: Yvonne Patel MRN: [id number] DOB: 1945/03/20   Cancelled treatment:  Pt remains intubated.  Will follow for readiness.  [name] L. Tivis Ringer, [place] CCC/SLP Acute Rehabilitation Services Office number [phone] Pager [phone]          [name] [place] 03/09/2018, 8:50 AM

## 2018-03-09 NOTE — Progress Notes (Signed)
Initial Nutrition Assessment  DOCUMENTATION CODES:   Not applicable  INTERVENTION:   If unable to extubate, recommend initiation of TF via OG tube: - Vital High Protein @ 35 ml/hr (840 ml/day) - Pro-stat 30 ml BID - Liquid MVI  Tube feeding regimen provides 1040 kcal, 74 grams of protein, and 706 ml of H2O.   Tube feeding regimen and current propofol and cleviprex provides 1478 total kcal (105% of needs).  NUTRITION DIAGNOSIS:   Inadequate oral intake related to inability to eat as evidenced by NPO status.  GOAL:   Patient will meet greater than or equal to 90% of their needs  MONITOR:   Vent status, Labs, Weight trends, I & O's  REASON FOR ASSESSMENT:   Ventilator    ASSESSMENT:   73 year old female who presented to the ED on 1/1 as code stroke. PMH significant for stroke, T2DM, syncope, PAD, HTN, HLD, CEA, cardiomyopathy, neuropathy. PT received TPA in the ED. CT showed left MCA acute ischemic stroke and taken for IR chemical thrombectomy.  Discussed pt with RN. Noted plan for possible extubation following MRI later this afternoon. RD to leave TF recommendations if unable to extubate.  Weight fairly stable over the last 5 years.  Patient is currently intubated on ventilator support. OG tube in stomach. MV: 7.1 L/min Temp (24hrs), Avg:97.6 F (36.4 C), Min:96.5 F (35.8 C), Max:98.5 F (36.9 C) BP: 160/51 MAP: 86  Propofol: 9.3 ml/hr (provides 246 kcal daily) NS: 75 ml/hr Cleviprex: 4 ml/hr (provides 192 kcal daily) Nitroglycerin in D5: 12 ml/hr  Medications reviewed and include: Protonix  Labs reviewed: HDL 25 (L), hemoglobin A1C 7.1 (H), potassium 3.3 (L), BUN 32 (H), creatinine 2.25 (H) CBG: 175  UOP: 675 ml x 24 hours I/O's: +1.4 L since admit  NUTRITION - FOCUSED PHYSICAL EXAM:    Most Recent Value  Orbital Region  No depletion  Upper Arm Region  No depletion  Thoracic and Lumbar Region  No depletion  Buccal Region  Unable to assess  Temple  Region  Mild depletion  Clavicle Bone Region  No depletion  Clavicle and Acromion Bone Region  No depletion  Scapular Bone Region  Unable to assess  Dorsal Hand  No depletion  Patellar Region  No depletion  Anterior Thigh Region  No depletion  Posterior Calf Region  No depletion  Edema (RD Assessment)  None  Hair  Reviewed  Eyes  Reviewed  Mouth  Reviewed  Skin  Reviewed  Nails  Reviewed       Diet Order:   Diet Order            Diet NPO time specified  Diet effective now              EDUCATION NEEDS:   Not appropriate for education at this time  Skin:  Skin Assessment: Reviewed RN Assessment (incision to neck)  Last BM:  PTA/unknown  Height:   Ht Readings from Last 1 Encounters:  05/31/17 5\' 5"  (1.651 m)    Weight:   Wt Readings from Last 1 Encounters:  03/25/2018 77.7 kg    Ideal Body Weight:  56.8 kg (calculated using height from 05/31/17)  BMI:  Body mass index is 28.51 kg/m.  Estimated Nutritional Needs:   Kcal:  1412  Protein:  100-115 grams  Fluid:  >/= 1.6 L    Gaynell Face, MS, RD, LDN Inpatient Clinical Dietitian Pager: [phone] Weekend/After Hours: [phone]

## 2018-03-09 NOTE — Progress Notes (Signed)
PT Cancellation Note  Patient Details Name: LUNDYNN COHOON MRN: [id number] DOB: Jun 22, 1945   Cancelled Treatment:    Reason Eval/Treat Not Completed: Active bedrest order.  Pt remains on bedrest and intubated.  PT will await medical stability.  Please page if she can be seen later in the day.  Thanks,  Barbarann Ehlers. Kaleeah Gingerich, PT, DPT  Acute Rehabilitation [phone] pager [phone]) [phone] office     Barbarann Ehlers Aaima Gaddie 03/09/2018, 9:33 AM

## 2018-03-09 NOTE — Sedation Documentation (Signed)
SB[facility] called to [employer], Therapist, sports

## 2018-03-09 NOTE — Progress Notes (Signed)
RT NOTE: RT transported patient with RN from 4N22 to MRI and back with no complications. RT will continue to monitor.

## 2018-03-10 ENCOUNTER — Inpatient Hospital Stay (HOSPITAL_COMMUNITY): Payer: PPO

## 2018-03-10 DIAGNOSIS — I63412 Cerebral infarction due to embolism of left middle cerebral artery: Principal | ICD-10-CM

## 2018-03-10 DIAGNOSIS — I6523 Occlusion and stenosis of bilateral carotid arteries: Secondary | ICD-10-CM

## 2018-03-10 DIAGNOSIS — N183 Chronic kidney disease, stage 3 (moderate): Secondary | ICD-10-CM

## 2018-03-10 LAB — BASIC METABOLIC PANEL
Anion gap: 12 (ref 5–15)
BUN: 42 mg/dL — ABNORMAL HIGH (ref 8–23)
CO2: 15 mmol/L — ABNORMAL LOW (ref 22–32)
Calcium: 8.1 mg/dL — ABNORMAL LOW (ref 8.9–10.3)
Chloride: 113 mmol/L — ABNORMAL HIGH (ref 98–111)
Creatinine, Ser: 4.27 mg/dL — ABNORMAL HIGH (ref 0.44–1.00)
GFR calc non Af Amer: 10 mL/min — ABNORMAL LOW (ref >60)
GFR, EST AFRICAN AMERICAN: 11 mL/min — AB (ref >60)
Glucose, Bld: 155 mg/dL — ABNORMAL HIGH (ref 70–99)
Potassium: 5.1 mmol/L (ref 3.5–5.1)
Sodium: 140 mmol/L (ref 135–145)

## 2018-03-10 LAB — CBC
HCT: 28.8 % — ABNORMAL LOW (ref 36.0–46.0)
Hemoglobin: 9 g/dL — ABNORMAL LOW (ref 12.0–15.0)
MCH: 30 pg (ref 26.0–34.0)
MCHC: 31.3 g/dL (ref 30.0–36.0)
MCV: 96 fL (ref 80.0–100.0)
Platelets: 145 10*3/uL — ABNORMAL LOW (ref 150–400)
RBC: 3 MIL/uL — ABNORMAL LOW (ref 3.87–5.11)
RDW: 13.2 % (ref 11.5–15.5)
WBC: 8.4 10*3/uL (ref 4.0–10.5)
nRBC: 0 % (ref 0.0–0.2)

## 2018-03-10 LAB — RENAL FUNCTION PANEL
Albumin: 2.1 g/dL — ABNORMAL LOW (ref 3.5–5.0)
Anion gap: 11 (ref 5–15)
BUN: 32 mg/dL — AB (ref 8–23)
CO2: 20 mmol/L — ABNORMAL LOW (ref 22–32)
Calcium: 8.1 mg/dL — ABNORMAL LOW (ref 8.9–10.3)
Chloride: 110 mmol/L (ref 98–111)
Creatinine, Ser: 2.97 mg/dL — ABNORMAL HIGH (ref 0.44–1.00)
GFR calc Af Amer: 17 mL/min — ABNORMAL LOW (ref >60)
GFR calc non Af Amer: 15 mL/min — ABNORMAL LOW (ref >60)
Glucose, Bld: 171 mg/dL — ABNORMAL HIGH (ref 70–99)
Phosphorus: 6 mg/dL — ABNORMAL HIGH (ref 2.5–4.6)
Potassium: 4.1 mmol/L (ref 3.5–5.1)
Sodium: 141 mmol/L (ref 135–145)

## 2018-03-10 LAB — GLUCOSE, CAPILLARY
Glucose-Capillary: 159 mg/dL — ABNORMAL HIGH (ref 70–99)
Glucose-Capillary: 142 mg/dL — ABNORMAL HIGH (ref 70–99)
Glucose-Capillary: 155 mg/dL — ABNORMAL HIGH (ref 70–99)
Glucose-Capillary: 96 mg/dL (ref 70–99)
Glucose-Capillary: 123 mg/dL — ABNORMAL HIGH (ref 70–99)
Glucose-Capillary: 138 mg/dL — ABNORMAL HIGH (ref 70–99)

## 2018-03-10 LAB — MAGNESIUM: Magnesium: 2.1 mg/dL (ref 1.7–2.4)

## 2018-03-10 MED ORDER — PROPOFOL 1000 MG/100ML IV EMUL
5.0000 ug/kg/min | INTRAVENOUS | Status: DC
  Administered 2018-03-10 (×2): 5 ug/kg/min via INTRAVENOUS
  Filled 2018-03-10: qty 100

## 2018-03-10 MED ORDER — LABETALOL HCL 5 MG/ML IV SOLN
10.0000 mg | Freq: Once | INTRAVENOUS | Status: AC
  Administered 2018-03-10: 10 mg via INTRAVENOUS
  Filled 2018-03-10: qty 4

## 2018-03-10 MED ORDER — SODIUM CHLORIDE 0.9 % IV BOLUS
250.0000 mL | Freq: Once | INTRAVENOUS | Status: AC
  Administered 2018-03-10: 250 mL via INTRAVENOUS

## 2018-03-10 NOTE — Progress Notes (Addendum)
OT / PT Cancellation Note  Patient Details Name: Yvonne Patel MRN: [id number] DOB: 11/28/1945   Cancelled Treatment:    Reason Eval/Treat Not Completed: Patient not medically ready;Active bedrest order(sheath) OT order received and appreciated however this conflicts with current bedrest order set. Please increase activity tolerance as appropriate and remove bedrest from orders. OT will hold evaluation at this time and will check back as time allows pending increased activity orders.   Richelle Ito, OTR/L  Acute Rehabilitation Services Pager: [phone] Office: [phone] .  03/10/2018, 10:10 AM

## 2018-03-10 NOTE — Progress Notes (Signed)
PT Cancellation Note  Patient Details Name: DERIYAH KUNATH MRN: [id number] DOB: Sep 05, 1945   Cancelled Treatment:    Reason Eval/Treat Not Completed: Medical issues which prohibited therapy.  Pt with elevated troponin, cardiology consult pending.  PT will check back tomorrow.  Thanks,  Barbarann Ehlers. Slaton Reaser, PT, DPT  Acute Rehabilitation [phone] pager [phone]) [phone] office     Wells Guiles B Emmaline Wahba 03/10/2018, 2:43 PM

## 2018-03-10 NOTE — Progress Notes (Signed)
NAME:  Yvonne Patel, MRN:  [id number], DOB:  09-16-1945, LOS: 2 ADMISSION DATE:  03/15/2018, CONSULTATION DATE:  1/1 REFERRING MD:  Dr. Rory Percy , CHIEF COMPLAINT:  CVA   Brief History   73 year old female admitted with acute CVA s/p systemic TPA and mechanical thrombolysis.   Past Medical History   has a past medical history of Cardiomyopathy ([place]), Coronary artery disease, Hyperlipidemia, Hypertension, Neuromuscular disorder ([place]), PAD (peripheral artery disease) ([place]), Peripheral vascular disease ([place]), Refusal of blood transfusions as patient is Jehovah's Witness, Retinal disease, Syncope, TIA (transient ischemic attack) (2010; 09/2011), and Type II diabetes mellitus ([place]).  Significant Hospital Events   1/1 admit  Consults:  cardiology  Procedures:  1/1: VIR procedure completed for left MCA occlusion at M1 segment. The procedure involved two passes of combination mechanical and aspiration thrombectomy with restoration of TICI 2b flow. 1/1 left femoral art line >> 1/1 ETT >>  Significant Diagnostic Tests:  CT head 1/1 > no acute finding, multiple remote small vessel infarcts CTA head/neck 1/1> Left M2 branch occlusion beginning at the M1 bifurcation. Severe atherosclerosis which preferentially spares the left carotid circulation, question prior endarterectomy. Severe atheromatous narrowing if not occlusion of bilateral proximal subclavian arteries. There is bilateral fetal type PCA with diffuse thready flow in the posterior fossa. 70% atheromatous narrowing of the right common carotid and ICA bulb. CT head 1/1 (post IR) > New 3 mm high-density within the posterior right frontal cortex which could be focus of hemorrhage or an enhancing subacute infarct. No hemorrhage or infarct seen along the postoperative left MCA distribution.  TTE 1/1 >> - Left ventricle: The cavity size was mildly dilated. There was severe concentric hypertrophy. Systolic function was moderately to severely reduced.  The estimated ejection fraction was in the range of 30% to 35%. Mod global and severe inferior /inferolateral hypokinesis. Diastolic function assessment limited due to severe MAC. - Aortic valve: Moderately calcified annulus. Trileaflet; moderately calcified leaflets. Echodense projection seen off right aortic cusp on aortic side, not independently mobile. Seen partly on previous study in 05/2017. This most likely represents calcification or healed vegetation. There was trace stenosis. - Mitral valve: Severely thickened annulus. Severely calcified leaflets . Inadequate Doppler assessment to evaluate for mitral stenosis. Visually, appears to be mild calcific MS. - Left atrium: The atrium was severely dilated. - Pericardium, extracardiac: Trace posterior pericardial effusion. - Compared to previous study in 05/2017, LVEF is mildly reduced. Valvular calcifications and atheromatous calcifications in aortic root more prominently seen.  MRI Brain 1/2 >> moderate left MCA infarct, punctate acute infarcts involving the left caudate, right centrum semiovale, parasagittal right parietal cortex, bilateral cerebellar hemispheres.  4 mm hemorrhage right precentral gyrus.  Numerous microhemorrhages present basal ganglia both cerebral hemispheres, cerebellum. Advanced intracranial atherosclerosis, moderate ICA stenoses, chronic segmental occlusion basilar artery with decreased bilateral vertebral artery flow  Micro Data:  1/1 MRSA PCR  >> neg  Antimicrobials:  1/1 ancef preop  Interim history/subjective:  Cardiology consulted overnight for CP and positive troponin's  Remains on propofol 30 mcg/kg/min- withdrawals to pain, decreased in RUE  nitro gtt 50 mcg/min, cleviprex 30m/hr- still has R art line, unable to obtain accurate cuff pressures  Objective   Blood pressure (!) 161/53, pulse 74, temperature 98.3 F (36.8 C), temperature source Axillary, resp. rate 16, weight 77.7 kg, SpO2 100 %.    Vent Mode:  PRVC FiO2 (%):  [30 %-40 %] 30 % Set Rate:  [16 bmp] 16 bmp Vt  Set:  [450 mL] 450 mL PEEP:  [5 cmH20] 5 cmH20 Plateau Pressure:  [15 cmH20-19 cmH20] 19 cmH20   Intake/Output Summary (Last 24 hours) at 03/10/2018 0810 Last data filed at 03/10/2018 0700 Gross per 24 hour  Intake 1711.61 ml  Output 584 ml  Net 1127.61 ml   Filed Weights   03/19/2018 1600  Weight: 77.7 kg    Examination: General: Ill-appearing woman, no distress HEENT: ET tube in place Neuro: Sedated today.  Try to open eyes to voice.  Did not move her extremities (did so yesterday) PULM: Clear bilaterally GI: Soft, nondistended, positive bowel sounds Extremities: No deformity, no significant edema Skin: No rash  Resolved Hospital Problem list     Assessment & Plan:   Acute CVA:  Left M1/M2 distribution. S/p systemic TPA 1/1 at 1700, and Neuro IR thrombectomy.  Associated hemorrhage. -Stroke service and interventional radiology management -Goal SBP 140-160 -Cleviprex if needed  Acute respiratory insufficency related to above  P:  -Okay to initiate spontaneous breathing trials -Follow intermittent chest x-ray  Hypertension PAD -Difficulty to monitor her blood pressure due to severe subclavian stenoses, currently using femoral arterial catheter.  We will need to correlate values -Blood pressure goals as above -Plan to DC femoral art line once stabilized  Chest pain NSTEMI - troponin  peaked Systolic HF - TTE 1/1 with EF 30-35% -Appreciate cardiology assistance.  No plans for catheterization at this time -Continue aspirin.  Heparin on hold until/unless cleared by neurology given her intracranial hemorrhage associated with stroke -Lipitor as ordered -Wean Cleviprex and nitroglycerin as able  Acute on chronic renal failure, suspect some component of hypoperfusion, dye load -Avoid any further diuretics (given on 1/2) -Follow BMP, urine output -Replace electrolytes as indicated  DM2: -Sliding scale  insulin as ordered   Best practice:  Diet: NPO Pain/Anxiety/Delirium protocol (if indicated): propofol RASS goal 0 to -1 VAP protocol (if indicated): per protocol DVT prophylaxis: on hold for now after tpa GI prophylaxis: protonix Glucose control: ssi Mobility: BR Code Status: Full Family Communication: Discussed with patient's daughter and granddaughter at bedside 1/3 Disposition: ICU  Labs   CBC: Recent Labs  Lab 03/30/2018 1642 03/16/2018 1656 03/09/18 1040 03/10/18 0503  WBC  --  6.3 6.6 8.4  NEUTROABS  --  3.2  --   --   HGB 13.9 12.2 9.5* 9.0*  HCT 41.0 38.5 30.1* 28.8*  MCV  --  94.1 95.3 96.0  PLT  --  174 150 145*    Basic Metabolic Panel: Recent Labs  Lab 03/11/2018 1642 04/04/2018 1656 03/09/18 1040 03/09/18 1055 03/10/18 0503  NA 138 140  --  139 141  K 3.8 3.3*  --  3.6 4.1  CL 102 101  --  107 110  CO2  --  25  --  21* 20*  GLUCOSE 180* 191*  --  174* 171*  BUN 32* 32*  --  25* 32*  CREATININE 2.20* 2.25*  --  2.06* 2.97*  CALCIUM  --  9.3  --  8.1* 8.1*  MG  --   --  1.9  --  2.1  PHOS  --   --   --  5.4* 6.0*   GFR: CrCl cannot be calculated (Unknown ideal weight.). Recent Labs  Lab 04/01/2018 1656 03/09/18 1040 03/10/18 0503  WBC 6.3 6.6 8.4    Liver Function Tests: Recent Labs  Lab 04/05/2018 1656 03/09/18 1055 03/10/18 0503  AST 27  --   --  ALT 26  --   --   ALKPHOS 97  --   --   BILITOT 0.7  --   --   PROT 7.4  --   --   ALBUMIN 3.0* 2.1* 2.1*   No results for input(s): LIPASE, AMYLASE in the last 168 hours. No results for input(s): AMMONIA in the last 168 hours.  ABG    Component Value Date/Time   PHART 7.437 03/09/2018 0939   PCO2ART 33.9 03/09/2018 0939   PO2ART 105.0 03/09/2018 0939   HCO3 22.9 03/09/2018 0939   TCO2 24 03/09/2018 0939   ACIDBASEDEF 1.0 03/09/2018 0939   O2SAT 98.0 03/09/2018 0939     Coagulation Profile: Recent Labs  Lab 04/04/2018 1656  INR 1.00    Cardiac Enzymes: Recent Labs  Lab  03/09/18 0019 03/09/18 0432 03/09/18 1040 03/09/18 1526 03/09/18 2224  TROPONINI 6.47* 5.08* 4.70* 3.81* 3.88*    HbA1C: Hgb A1c MFr Bld  Date/Time Value Ref Range Status  03/09/2018 04:32 AM 7.1 (H) 4.8 - 5.6 % Final    Comment:    (NOTE) Pre diabetes:          5.7%-6.4% Diabetes:              >6.4% Glycemic control for   <7.0% adults with diabetes   05/31/2017 03:59 PM 6.7 (H) 4.8 - 5.6 % Final    Comment:    (NOTE) Pre diabetes:          5.7%-6.4% Diabetes:              >6.4% Glycemic control for   <7.0% adults with diabetes     CBG: Recent Labs  Lab 03/09/18 1517 03/09/18 1955 03/09/18 2319 03/10/18 0327 03/10/18 0747  GLUCAP 175* 161* 182* 159* 142*    Critical care time: 33 mins     Baltazar Apo, MD, PhD 03/10/2018, 8:32 AM [facility] [phone] or if no answer [phone]

## 2018-03-10 NOTE — Progress Notes (Signed)
Subjective:  Remains intubated, sedated. Spontaneous movements left side.    Objective:  Vital Signs in the last 24 hours: Temp:  [98 F (36.7 C)-100.6 F (38.1 C)] 98.1 F (36.7 C) (01/03 2000) Pulse Rate:  [51-88] 72 (01/03 1953) Resp:  [16-32] 26 (01/03 1953) BP: (130-161)/(46-56) 130/56 (01/03 1953) SpO2:  [99 %-100 %] 100 % (01/03 1953) Arterial Line BP: (105-176)/(35-52) 112/49 (01/03 1900) FiO2 (%):  [30 %] 30 % (01/03 1953)  Intake/Output from previous day: 01/02 0701 - 01/03 0700 In: 1803 [I.V.:1803] Out: 584 [Urine:584] Intake/Output from this shift: No intake/output data recorded.  Physical Exam: Nursing note and vitals reviewed. Constitutional: She appears well-developed and well-nourished.  Intubated and sedated  HENT:  Head: Normocephalic and atraumatic.  Eyes: Pupils are equal, round, and reactive to light.  Neck: No JVD present.  Cardiovascular: S1 normal and S2 normal.  Murmur heard.  Crescendo systolic murmur is present with a grade of 2/6.  Diastolic (Mitral area) murmur is present with a grade of 1/6. Pulses:      Carotid pulses are on the right side with bruit and on the left side with bruit.      Femoral pulses are 1+ on the right side and 1+ on the left side.      Popliteal pulses are 0 on the right side and 0 on the left side.       Dorsalis pedis pulses are 0 on the right side and 0 on the left side.       Posterior tibial pulses are 0 on the right side and 0 on the left side.  Lymphadenopathy:    She has no cervical adenopathy.  Neurological: Spontaneous movements on left side, no purposeful movements. Abdomen: Nontender   Lab Results: Recent Labs    03/09/18 1040 03/10/18 0503  WBC 6.6 8.4  HGB 9.5* 9.0*  PLT 150 145*   Recent Labs    03/10/18 0503 03/10/18 1725  NA 141 140  K 4.1 5.1  CL 110 113*  CO2 20* 15*  GLUCOSE 171* 155*  BUN 32* 42*  CREATININE 2.97* 4.27*   Recent Labs    03/09/18 1526 03/09/18 2224   TROPONINI 3.81* 3.88*   Hepatic Function Panel Recent Labs    04/01/2018 1656  03/10/18 0503  PROT 7.4  --   --   ALBUMIN 3.0*   < > 2.1*  AST 27  --   --   ALT 26  --   --   ALKPHOS 97  --   --   BILITOT 0.7  --   --    < > = values in this interval not displayed.   Recent Labs    03/09/18 0432  CHOL 141   Cardiac studies: EKG 04/03/2018: Sinus or ectopic atrial tachycardia. Left atrial enlargement. IVCD. Nonspecific lateral ST-T changes.  Other than rate increase, no changes compated to previous EKG in 05/2017.  Hospital echocardiogram 03/23/2018: - Left ventricle: The cavity size was mildly dilated. There was severe concentric hypertrophy. Systolic function was moderately to severely reduced. The estimated ejection fraction was in the range of 30% to 35%. Mod global and severe inferior/inferolateral hypokinesis. Diastolic function assessment limited due to severe MAC. - Aortic valve: Moderately calcified annulus. Trileaflet; moderately calcified leaflets. Echodense projection seen off right aortic cusp on aortic side, not independently mobile. Seen partly on previous study in 05/2017. This most likely represents calcification or healed vegetation. There was trace stenosis. - Mitral valve: Severely  thickened annulus. Severely calcified leaflets . Inadequate Doppler assessment to evaluate for mitral stenosis. Visually, appears to be mild calcific MS. - Left atrium: The atrium was severely dilated. - Pericardium, extracardiac: Trace posterior pericardial effusion. - Compared to previous study in 05/2017, LVEF is mildly reduced. Valvular calcifications and atheromatous calcifications in aortic root more prominently seen.  Cath 06/22/2016:  Ost RCA lesion, 100 %stenosed.  Ost 1st Diag to 1st Diag lesion, 80 %stenosed.  Ost 2nd Mrg to 2nd Mrg lesion, 80 %stenosed.  There is mild left ventricular systolic dysfunction.  LV end diastolic  pressure is normal.  The left ventricular ejection fraction is 35-45% by visual estimate.   Cerebral angiography 06/22/2016: Arch aortogram reveals type A arch, bilateral vertebral artery is occluded. Right subclavian artery with a high-grade and complex calcified 90% stenosis at its origin followed by ectasia and a tandem 90% stenosis. Right vertebral occluded. Right ICA calcific 60-70% stenosis. Left ICA complex calcified 90% stenosis.  Bilateral selective renal angiogram: Widely patent renal artery stents, left kidney appears shrunken.  Coronary angiography 06/22/2016: LVEF 40-45% with diffuse hypokinesis, marked LVH, normal LVEDP. RCA dominant and is occluded in the proximal segment with bridging collaterals. There is also contralateral collaterals. Left main, LAD calcific disease without high-grade stenosis, diagonal 1 with a ostial 80% stenosis. Circumflex coronary artery OM1 large with a ostial 90% stenosis.  Complication: There was no immediate complication, however patient developed chest pain, fatigue and not feeling well when we reduced her initial resenting blood pressure from 300/60 mmHg down to 150/45 mmHg. Patient received atropine to improve blood pressure, patient immediately felt well.  Recommendation: Extremely complex situation with a high-grade stenosis of left ICA and diffuse coronary artery disease, cardiomyopathy probably related to hypertension with hypertensive heart disease then coronary artery disease. Would recommend medical therapy for CAD. I will discuss with Dr. Oneida Alar regarding revascularization strategy of the left carotid artery. 100 mL of contrast utilized.  Assessment/Recommednations:  73 year old [name] with severe atherosclerotic cardiovascular disease with medically managed diffuse CAD, severe PAD including prior embolic CVA, b/l renal artery stenting, Rt CIA stenting, h/o left carotid endarterctomy, b/l subclavian artery stenosis,  Jehovah's witness, type 2 DM, hypertension, now with left acute left MCA CVA, treated with tPA and mechanical thrombectomy. Cardiology consulted for troponin elevation.  NSTEMI: Medical management with DAPT and heparin, if and when deemed safe by Neurology. Severe vasculapath with periprocedural risk. Risks of invasive management outweighs benefits in patient with recent stroke. Lipitor 80 mg daily. Low dose beta blocker when permissive hypertension period is over.  HFrEF: EF mildly reduced compared to baseline. Will consider heart failure medical therapy when permissive hypertension period is over.  Appears euvolemic on exam.  Hold Lasix and in light of worsening creatinine and oliguria.    Ischemic stroke: Management as per neurology.   AKI/CKD 3: Management per the primary team. Prophylaxis  Overall prognosis critical. Conservative management plan discussed at bedside and over the phone with patient's daughters [place] and [place] and son Franchot Mimes.     LOS: 2 days     J  03/10/2018, 10:24 PM  [place], MD [facility] Pager: [phone] Office: [phone] If no answer Cell [phone]

## 2018-03-10 NOTE — Progress Notes (Signed)
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 Earleen Newport. 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 Earleen Newport. Patient laying in bed intubated and sedated. Accompanied by 4 family members at bedside. Withdraws all 4 extremities from pain, left side>right side; positive Babinski of left foot. Right groin incision c/d/i. Left femoral arterial line c/d/i.   Allergies: Blood-group specific substance  Medications: Prior to Admission medications   Medication Sig Start Date End Date Taking? Authorizing Provider  aspirin EC 81 MG tablet Take 81 mg by mouth at bedtime.    Yes [provider]  atorvastatin (LIPITOR) 80 MG tablet Take 80 mg by mouth daily at 6 PM.  06/01/16  Yes [provider]  carvedilol (COREG) 3.125 MG tablet Take 1 tablet (3.125 mg total) by mouth 2 (two) times daily. 06/03/17 06/03/18 Yes Lavina Hamman, MD  clopidogrel (PLAVIX) 75 MG tablet Take 75 mg by mouth at bedtime.    Yes [provider]  furosemide (LASIX) 20 MG tablet Take 1 tablet (20 mg total) by mouth daily as needed. For weight gain of 3 lbs in 1 day or 5Lbs in 2 days. 06/03/17 06/03/18 Yes Lavina Hamman, MD  insulin degludec (TRESIBA FLEXTOUCH) 100 UNIT/ML SOPN FlexTouch Pen Inject 15 Units into the skin every morning.    Yes [provider]  metFORMIN (GLUCOPHAGE) 500 MG tablet Take 2 tablets (1,000 mg total) by mouth 2 (two) times daily. Patient taking differently: Take 500 mg by mouth 2 (two) times daily.  06/24/16  Yes [name], MD  Ferrous Fumarate (HEMOCYTE - 106 MG FE) 324 (106 Fe) MG TABS tablet Take 1 tablet (106 mg of iron total) by mouth 2 (two) times daily. Patient not taking: Reported on 03/10/2018 07/24/16   Oswald Hillock,  MD     Vital Signs: BP (!) 161/53   Pulse 78   Temp 99.1 F (37.3 C) (Axillary)   Resp (!) 26   Wt 171 lb 4.8 oz (77.7 kg)   SpO2 100%   BMI 28.51 kg/m   Physical Exam Vitals signs and nursing note reviewed.  Constitutional:      General: She is not in acute distress.    Appearance: Normal appearance.     Comments: Intubated and sedated.  Pulmonary:     Effort: Pulmonary effort is normal. No respiratory distress.     Comments: Intubated and sedated. Skin:    General: Skin is warm and dry.     Comments: Right groin incision soft without active bleeding or hematoma. Left femoral arterial line site soft without active bleeding or hematoma.  Neurological:     Mental Status: She is alert.     Comments: Intubated and sedated. Speech and comprehension not assessed. PERRL bilaterally. EOMs not assessed. Visual fields not assessed. Unable to assess facial asymmetry due to ET tube. Unable to assess tongue protrusion due to ET tube. Withdraws all extremities from pain, positive Babinski of left foot. Pronator drift not assessed. Fine motor and coordination not assessed. Gait not assessed. Romberg not assessed. Heel to toe not assessed. Distal pulses 1+ bilaterally.  Psychiatric:     Comments: Intubated and sedated.     Imaging: Ct Angio Head W Or Wo Contrast  Result Date: 03/22/2018 CLINICAL DATA:  Code stroke, deficit  not specified EXAM: CT ANGIOGRAPHY HEAD AND NECK TECHNIQUE: Multidetector CT imaging of the head and neck was performed using the standard protocol during bolus administration of intravenous contrast. Multiplanar CT image reconstructions and MIPs were obtained to evaluate the vascular anatomy. Carotid stenosis measurements (when applicable) are obtained utilizing NASCET criteria, using the distal internal carotid diameter as the denominator. CONTRAST:  19mL ISOVUE-370 IOPAMIDOL (ISOVUE-370) INJECTION 76% COMPARISON:  None. FINDINGS: CTA NECK FINDINGS Aortic  arch: Extensive atherosclerotic calcification. Three vessel branching Right carotid system: Diffuse mainly calcified atherosclerotic plaque which limits visualization of the lumen due to plaque blooming. There is distal common carotid stenosis that measures up to 70% (when compared to the more proximal vessel given the immediate downstream bifurcation). There is ICA bulb plaque with 70% stenosis. Left carotid system: Much less extensive atherosclerotic plaque, question prior carotid endarterectomy. No stenosis or ulceration. Vertebral arteries: Severe bilateral proximal subclavian stenosis, with possible occlusion on the right. Narrowing on the left involves and extensive segment. No flow is seen within vertebral arteries until the V3 segments, there may be retrograde flow Skeleton: Diffuse degenerative disease.  No acute finding Other neck: Bilateral cataract resection. Upper chest: Hazy density of the lungs which could be from atelectasis. No Kerley lines to implicate edema. Review of the MIP images confirms the above findings CTA HEAD FINDINGS Anterior circulation: Confluent atherosclerotic calcification on the carotid siphons with small vessel size and plaque blooming limiting luminal visualization. There is a left M2 branch occlusion beginning at the M1 bifurcation with subsequent downstream reconstitution. No contralateral embolism is seen. Posterior circulation: Tiny vertebrobasilar arteries. There may be a basilar interruption from atherosclerosis or developmental variant. Fetal type flow to both posterior cerebral arteries which are symmetrically patent Venous sinuses: Patent as permitted by contrast timing Anatomic variants: As above Delayed phase: Not obtained in the emergent setting Review of the MIP images confirms the above findings Case discussed with Dr. Rory Percy at 5:04 p.m. IMPRESSION: 1. Left M2 branch occlusion beginning at the M1 bifurcation. 2. Severe atherosclerosis which preferentially spares  the left carotid circulation, question prior endarterectomy. 3. Severe atheromatous narrowing if not occlusion of bilateral proximal subclavian arteries. There is bilateral fetal type PCA with diffuse thready flow in the posterior fossa. 4. 70% atheromatous narrowing of the right common carotid and ICA bulb. Electronically Signed   By: Monte Fantasia M.D.   On: 03/19/2018 17:16   Ct Head Wo Contrast  Result Date: 03/16/2018 CLINICAL DATA:  Post thrombectomy EXAM: CT HEAD WITHOUT CONTRAST TECHNIQUE: Contiguous axial images were obtained from the base of the skull through the vertex without intravenous contrast. COMPARISON:  Head CT from earlier today FINDINGS: Brain: 3 mm focus of high density along the high and posterior right frontal convexity, on reformats this appears to be intraparenchymal. No hemorrhagic conversion or visible infarct in the area of procedure. Multiple remote small vessel infarcts. Vascular: Major vessels are symmetrically enhancing. Skull: Negative Sinuses/Orbits: Negative These results were called by telephone at the time of interpretation on 03/28/2018 at 8:17 pm to Dr. Cheral Marker , who verbally acknowledged these results. IMPRESSION: New 3 mm high-density within the posterior right frontal cortex which could be focus of hemorrhage or an enhancing subacute infarct. No hemorrhage or infarct seen along the postoperative left MCA distribution. Electronically Signed   By: Monte Fantasia M.D.   On: 03/16/2018 20:18   Ct Angio Neck W Or Wo Contrast  Result Date: 04/01/2018 CLINICAL DATA:  Code stroke, deficit not specified EXAM:  CT ANGIOGRAPHY HEAD AND NECK TECHNIQUE: Multidetector CT imaging of the head and neck was performed using the standard protocol during bolus administration of intravenous contrast. Multiplanar CT image reconstructions and MIPs were obtained to evaluate the vascular anatomy. Carotid stenosis measurements (when applicable) are obtained utilizing NASCET criteria, using the  distal internal carotid diameter as the denominator. CONTRAST:  84mL ISOVUE-370 IOPAMIDOL (ISOVUE-370) INJECTION 76% COMPARISON:  None. FINDINGS: CTA NECK FINDINGS Aortic arch: Extensive atherosclerotic calcification. Three vessel branching Right carotid system: Diffuse mainly calcified atherosclerotic plaque which limits visualization of the lumen due to plaque blooming. There is distal common carotid stenosis that measures up to 70% (when compared to the more proximal vessel given the immediate downstream bifurcation). There is ICA bulb plaque with 70% stenosis. Left carotid system: Much less extensive atherosclerotic plaque, question prior carotid endarterectomy. No stenosis or ulceration. Vertebral arteries: Severe bilateral proximal subclavian stenosis, with possible occlusion on the right. Narrowing on the left involves and extensive segment. No flow is seen within vertebral arteries until the V3 segments, there may be retrograde flow Skeleton: Diffuse degenerative disease.  No acute finding Other neck: Bilateral cataract resection. Upper chest: Hazy density of the lungs which could be from atelectasis. No Kerley lines to implicate edema. Review of the MIP images confirms the above findings CTA HEAD FINDINGS Anterior circulation: Confluent atherosclerotic calcification on the carotid siphons with small vessel size and plaque blooming limiting luminal visualization. There is a left M2 branch occlusion beginning at the M1 bifurcation with subsequent downstream reconstitution. No contralateral embolism is seen. Posterior circulation: Tiny vertebrobasilar arteries. There may be a basilar interruption from atherosclerosis or developmental variant. Fetal type flow to both posterior cerebral arteries which are symmetrically patent Venous sinuses: Patent as permitted by contrast timing Anatomic variants: As above Delayed phase: Not obtained in the emergent setting Review of the MIP images confirms the above findings  Case discussed with Dr. Rory Percy at 5:04 p.m. IMPRESSION: 1. Left M2 branch occlusion beginning at the M1 bifurcation. 2. Severe atherosclerosis which preferentially spares the left carotid circulation, question prior endarterectomy. 3. Severe atheromatous narrowing if not occlusion of bilateral proximal subclavian arteries. There is bilateral fetal type PCA with diffuse thready flow in the posterior fossa. 4. 70% atheromatous narrowing of the right common carotid and ICA bulb. Electronically Signed   By: Monte Fantasia M.D.   On: 03/26/2018 17:16   Mr Jodene Nam Head Wo Contrast  Result Date: 03/09/2018 CLINICAL DATA:  Stroke follow-up. Status post endovascular revascularization of left MCA occlusion yesterday. EXAM: MRI HEAD WITHOUT CONTRAST MRA HEAD WITHOUT CONTRAST TECHNIQUE: Multiplanar, multiecho pulse sequences of the brain and surrounding structures were obtained without intravenous contrast. Angiographic images of the head were obtained using MRA technique without contrast. COMPARISON:  Head CT and CTA 04/03/2018. Brain MRI 07/20/2016. Head MRA 09/30/2011. FINDINGS: MRI HEAD FINDINGS Brain: There is a small to slightly moderate-sized acute left MCA infarct involving the insula, predominantly posterior frontal lobe as well as operculum, and small amount of the postcentral gyrus. There also punctate acute infarcts involving the left caudate nucleus, right centrum semiovale, parasagittal right parietal cortex, and likely left occipital white matter. There are also small acute infarcts in the left greater than right cerebellar hemispheres. Susceptibility artifact in the right precentral gyrus corresponds to the 4 mm acute hemorrhage on yesterday's CT with minimal surrounding edema. There are numerous chronic microhemorrhages clustered about the right insula and operculum which are new from the 2018 MRI. Multiple additional chronic microhemorrhages are  present in the basal ganglia bilaterally and scattered throughout  both cerebral hemispheres and cerebellum. Patchy T2 hyperintensities in the cerebral white matter and pons have progressed from the prior MRI and are nonspecific but compatible with moderate chronic small-vessel ischemic disease. There are new chronic lacunar infarcts in the cerebral white matter, and there are chronic lacunar infarcts in the basal ganglia bilaterally. Small chronic infarcts in the cerebellar hemispheres have increased in number from the prior MRI. Mild cerebral atrophy is within normal limits for age. No mass, midline shift, or extra-axial fluid collection is identified. Vascular: Diminutive vertebrobasilar circulation, more fully evaluated below. Skull and upper cervical spine: Unremarkable bone marrow signal. Sinuses/Orbits: Bilateral cataract extraction. Paranasal sinuses and mastoid air cells are clear. Other: Partially visualized endotracheal and enteric tubes. MRA HEAD FINDINGS The distal vertebral arteries are diminutive with diminished flow most notable in the distal right V3 and V4 segments and with bilateral proximal vertebral artery occlusion shown on yesterday's neck CTA. Patent bilateral PICA, right AICA, and bilateral SCA origins are identified. There is a chronic lack of visualization of the mid basilar artery which appears patent more proximally and distally, and this may reflect segmental atherosclerotic occlusion or a developmental variant. There are large posterior communicating arteries bilaterally with absence or severe hypoplasia of the right P1 segment. Both PCAs are patent without evidence of significant proximal stenosis. The internal carotid arteries are patent from skull base to carotid termini with extensive bilateral cavernous segment atherosclerotic irregularity resulting in mild stenosis on the right and moderate stenosis on the left. 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 ACAs and MCAs are patent with branch vessel irregularity but no evidence of proximal branch occlusion. The left MCA trifurcation remains patent following thrombectomy although there is moderate stenosis of the inferior and mid M2 origins. There also mild proximal left M1 and left A1 stenoses. IMPRESSION: 1. Small to moderate-sized acute left MCA infarct. 2. Additional predominantly subcentimeter acute infarcts in the cerebral and cerebellar hemispheres bilaterally. 3. 4 mm acute hemorrhage in the right precentral gyrus as seen on CT yesterday. 4. Progressive chronic small vessel ischemia since 2018 with increased number of chronic microhemorrhages and chronic lacunar infarcts. 5. Advanced intracranial atherosclerosis. Revascularized left MCA bifurcation remains patent with moderate M2 origin stenoses. 6. Moderate ICA stenoses. 7. Diminished flow in the distal vertebral arteries with bilateral proximal occlusion shown on prior CTA. 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 8. 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 Electronically Signed   By: Logan Bores M.D.   On: 03/09/2018 17:17   Mr Brain Wo Contrast  Result Date: 03/09/2018 CLINICAL DATA:  Stroke follow-up. Status post endovascular revascularization of left MCA occlusion yesterday. EXAM: MRI HEAD WITHOUT CONTRAST MRA HEAD WITHOUT CONTRAST TECHNIQUE: Multiplanar, multiecho pulse sequences of the brain and surrounding structures were obtained without intravenous contrast. Angiographic images of the head were obtained using MRA technique without contrast. COMPARISON:  Head CT and CTA 03/19/2018. Brain MRI 07/20/2016. Head MRA 09/30/2011. FINDINGS: MRI HEAD FINDINGS Brain: There is a small to slightly moderate-sized acute left MCA infarct involving the insula, predominantly posterior frontal lobe  as well as operculum, and small amount of the postcentral gyrus. There also punctate acute infarcts involving the left caudate nucleus, right  centrum semiovale, parasagittal right parietal cortex, and likely left occipital white matter. There are also small acute infarcts in the left greater than right cerebellar hemispheres. Susceptibility artifact in the right precentral gyrus corresponds to the 4 mm acute hemorrhage on yesterday's CT with minimal surrounding edema. There are numerous chronic microhemorrhages clustered about the right insula and operculum which are new from the 2018 MRI. Multiple additional chronic microhemorrhages are present in the basal ganglia bilaterally and scattered throughout both cerebral hemispheres and cerebellum. Patchy T2 hyperintensities in the cerebral white matter and pons have progressed from the prior MRI and are nonspecific but compatible with moderate chronic small-vessel ischemic disease. There are new chronic lacunar infarcts in the cerebral white matter, and there are chronic lacunar infarcts in the basal ganglia bilaterally. Small chronic infarcts in the cerebellar hemispheres have increased in number from the prior MRI. Mild cerebral atrophy is within normal limits for age. No mass, midline shift, or extra-axial fluid collection is identified. Vascular: Diminutive vertebrobasilar circulation, more fully evaluated below. Skull and upper cervical spine: Unremarkable bone marrow signal. Sinuses/Orbits: Bilateral cataract extraction. Paranasal sinuses and mastoid air cells are clear. Other: Partially visualized endotracheal and enteric tubes. MRA HEAD FINDINGS The distal vertebral arteries are diminutive with diminished flow most notable in the distal right V3 and V4 segments and with bilateral proximal vertebral artery occlusion shown on yesterday's neck CTA. Patent bilateral PICA, right AICA, and bilateral SCA origins are identified. There is a chronic lack of visualization  of the mid basilar artery which appears patent more proximally and distally, and this may reflect segmental atherosclerotic occlusion or a developmental variant. There are large posterior communicating arteries bilaterally with absence or severe hypoplasia of the right P1 segment. Both PCAs are patent without evidence of significant proximal stenosis. The internal carotid arteries are patent from skull base to carotid termini with extensive bilateral cavernous segment atherosclerotic irregularity resulting in mild stenosis on the right and moderate stenosis on the left. 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 ACAs and MCAs are patent with branch vessel irregularity but no evidence of proximal branch occlusion. The left MCA trifurcation remains patent following thrombectomy although there is moderate stenosis of the inferior and mid M2 origins. There also mild proximal left M1 and left A1 stenoses. IMPRESSION: 1. Small to moderate-sized acute left MCA infarct. 2. Additional predominantly subcentimeter acute infarcts in the cerebral and cerebellar hemispheres bilaterally. 3. 4 mm acute hemorrhage in the right precentral gyrus as seen on CT yesterday. 4. Progressive chronic small vessel ischemia since 2018 with increased number of chronic microhemorrhages and chronic lacunar infarcts. 5. Advanced intracranial atherosclerosis. Revascularized left MCA bifurcation remains patent with moderate M2 origin stenoses. 6. Moderate ICA stenoses. 7. Diminished flow in the distal vertebral arteries with bilateral proximal occlusion shown on prior CTA. 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 8. 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 Electronically Signed   By: Logan Bores  M.D.   On: 03/09/2018 17:17   Ir Angiogram Extremity Bilateral  Result Date: 03/26/2018 INDICATION: 73 year old female with a history of acute left MCA stroke. She presents within time frame for IV tPA, and is a candidate for mechanical thrombectomy EXAM: ULTRASOUND GUIDED ACCESS RIGHT COMMON FEMORAL ARTERY ULTRASOUND GUIDED ACCESS LEFT COMMON  FEMORAL ARTERY FOR ARTERIAL MONITORING ANGIOGRAM RIGHT LOWER EXTREMITY BALLOON ANGIOPLASTY STENOSIS RIGHT COMMON ILIAC ARTERY IN ORDER TO PASS 8 FRENCH SHEATH TO COMPLETE THE THROMBECTOMY CEREBRAL ANGIOGRAM MECHANICAL THROMBECTOMY LEFT MCA EMERGENT LARGE VESSEL OCCLUSION DEPLOYMENT OF ANGIO-SEAL RIGHT COMMON FEMORAL ARTERY COMPARISON:  CT imaging of the same day MEDICATIONS: 2 g Ancef IV. The antibiotic was administered within 1 hour of the procedure ANESTHESIA/SEDATION: General endotracheal tube anesthesia CONTRAST:  96 cc Isovue-300 FLUOROSCOPY TIME:  Fluoroscopy Time: 26 minutes 24 seconds (1,362 mGy). COMPLICATIONS: None TECHNIQUE: Informed written consent was obtained from the patient's family after a thorough discussion of the procedural risks, benefits and alternatives. Specific risks discussed include: Bleeding, infection, contrast reaction, kidney injury/failure, need for further procedure/surgery, arterial injury or dissection, embolization to new territory, intracranial hemorrhage (10-15% risk), neurologic deterioration, cardiopulmonary collapse, death. All questions were addressed. Maximal Sterile Barrier Technique was utilized including during the procedure including caps, mask, sterile gowns, sterile gloves, sterile drape, hand hygiene and skin antiseptic. A timeout was performed prior to the initiation of the procedure. The anesthesia team was present to provide general endotracheal tube anesthesia and for patient monitoring during the procedure. Interventional neuro radiology nursing staff was also present. FINDINGS: Initial Findings: Left common carotid  artery: No significant stenosis of the left common carotid artery, with a unremarkable course caliber and contour. Surgical changes of prior endarterectomy evident on prior CT imaging. Left external carotid artery: Patent with antegrade flow. Left internal carotid artery: Normal course caliber and contour of the cervical portion. Vertical and petrous segment patent with normal course caliber contour. Cavernous segment patent. Clinoid segment patent. Antegrade flow of the ophthalmic artery. Ophthalmic segment patent. Terminus patent. Left MCA: Distal M1 occluded at the initiation of the case. There is partial filling of an inferior branch which is the non dominant branch to the frontal. Patent fetal PCA to the left P2 segment. Left ACA: A 1 segment patent. A 2 segment perfuses the right territory. Completion Findings: Left MCA: After 2 passes of mechanical thrombectomy plus aspiration, there is near complete restoration of flow through the left MCA territory, with slow flow through an M3/M4 branch of the parietal region compatible with TI CI 2b flow. 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 Physical exam At the initiation of the case, palpable bilateral common femoral artery pulses present No palpable distal pulses Doppler positive pulses of the right posterior tibial and anterior tibial (with systolic blood pressures above 200) Doppler positive pulse at the left posterior tibial (with systolic blood pressure above 200), with no Doppler signal at the left anterior tibial At completion of case Doppler positive pulses were only discovered on the right posterior tibial and anterior tibial arteries with blood pressures above 200. In the 140-160 range (goal) Doppler signal was not evident A completion of case Doppler positive pulses were only discovered on the left at the posterior tibial and not the left anterior tibial (with the pressure above 211 systolic). PROCEDURE: Patient  is brought emergently to the neuro angiography suite, with the patient identified appropriately and placed supine position on the table. Left radial arterial line was attempted by the anesthesia team. The patient is then prepped and draped in the usual sterile fashion. Ultrasound survey of the right inguinal region was performed with images stored and sent to PACs. 11 blade scalpel was used to make a small incision. Blunt dissection was performed. A micropuncture needle was used access the right common femoral artery under ultrasound. With  excellent arterial blood flow returned, an .018 micro wire was passed through the needle, observed to enter the abdominal aorta under fluoroscopy. The needle was removed, and a micropuncture sheath was placed over the wire. The inner dilator and wire were removed, and an 035 Bentson wire was advanced under fluoroscopy into the abdominal aorta. The sheath was removed and a standard 5 [place] vascular sheath was placed. The dilator was removed and the sheath was flushed. Ultrasound survey of the left inguinal region was then performed with images stored and sent to PACs, confirming patency of the vessel. A micropuncture needle was used access the left common femoral artery under ultrasound. With excellent arterial blood flow returned, and an .018 micro wire was passed through the needle, observed enter the abdominal aorta under fluoroscopy. The needle was removed, and a micropuncture sheath was placed over the wire. The inner dilator and wire were removed, and an 035 Bentson wire was advanced under fluoroscopy into the abdominal aorta. The sheath was removed and a standard 4 [place] vascular sheath was placed for arterial monitoring. The dilator was removed and the sheath was flushed. A 34F JB-1 diagnostic catheter was then advanced over the wire through the existing right femoral access to the proximal descending thoracic aorta. Wire was then removed. Double flush of the catheter was  performed. Catheter was then used to select the left cervical internal carotid artery. Formal angiogram was performed, with roadmap achieved. Exchange length Rosen wire was then passed through the diagnostic catheter to the distal cervical ICA and the diagnostic catheter was removed. The 5 French sheath was removed, with attempt at placing 8 French 55 cm bright tip sheath. Significant resistance was encountered with passing the sheath through the right iliac system. The sheath would not advanced through the pre-existing right common iliac artery stent. Sheath was withdrawn and rotated. We attempted to advance the sheath over the introducer which was pinned on the wire. We also removed the introducer in place to diagnostic catheter into the aorta and attempted to pass the sheath over the diagnostic catheter. None of these maneuvers were successful. Sheath was then withdrawn into the external iliac artery. Balloon angioplasty was performed of the pre-existing right common iliac artery stent with 5 mm x 40 mm standard balloon angioplasty. Balloon was removed and the introducer was then replaced. Eight French sheath was then again attempted to pass over the wire, unsuccessful through this segment of the iliac artery. The introducer was removed, diagnostic catheter was placed into the cervical segment of the internal carotid artery, and the Rose an wire was exchanged for a 260 cm Amplatz wire. With the stiff wire in place, another attempt was made to pass the sheath which was unsuccessful. Sheath was then again withdrawn to the external iliac artery, introducer was removed, and a second balloon angioplasty 6 mm x 40 mm was performed at the pre-existing stent. The sheath was then successfully advanced over the balloon as the balloon was deflated, into the abdominal aorta. The introducer was again placed after removing the balloon and the sheath was advanced 6 successfully into the aorta. Introducer was withdrawn. Once the  sheath was advanced over the wire to the thoracic aorta, sheath was flushed and attached to pressurized and heparinized saline bag for constant forward flow. Eight French 95 cm flow gate balloon catheter was then advanced over the wire to the distal cervical segment. Wire was removed. Then a coaxial intermediate catheter and microcatheter combination was prepared on the back table.  This combination was penumbra Ace 64 catheter and a Trevo Provue18 microcatheter, with a synchro soft wire. This combination was then advanced through the balloon guide into the ICA. System was advanced into the internal carotid artery, to the level of the occlusion. The micro wire was then carefully advanced through the occluded segment, with a distal knuckle configuration. Microcatheter was then pushed through the occluded segment and the wire was removed. Intermediate catheter was advanced over the micro wire to the carotid siphon, which would not advance beyond the ophthalmic segment through the terminus. Blood was then aspirated through the hub of the microcatheter, and a gentle contrast injection was performed confirming intraluminal position. A rotating hemostatic valve was then attached to the back end of the microcatheter, and a pressurized and heparinized saline bag was attached to the catheter. 4 mm x 40 mm solitaire device was then selected. Back flush was achieved at the rotating hemostatic valve, and then the device was gently advanced through the microcatheter to the distal end. The retriever was then unsheathed by withdrawing the microcatheter under fluoroscopy. Once the retriever was completely unsheathed, the microcatheter was carefully stripped from the delivery wire of the device. Control angiogram was then performed from the balloon catheter. 3 minute time interval was observed. The balloon on the balloon guide was then inflated to profile of the vessel. Constant aspiration was then performed through the intermediate  catheter, and constant gentle aspiration was performed at the balloon guide. This aspiration was continued as the retriever was gently and slowly withdrawn with fluoroscopic observation into the distal intermediate catheter. The entire system was then gently withdrawn from the intracranial ICA and into the balloon guide. Once the retriever was entirely removed from the system, free aspiration was confirmed at the hub of the balloon guide, with free blood return confirmed. Control angiogram was then performed. TICI 2a flow was achieved. Repeat angiogram with multiple angulation demonstrated persistent occlusion of parietal branch. The same coaxial system was again passed through the balloon guide catheter. The microcatheter system was then advanced through the occlusion of the parietal branch. Once the micro wire microcatheter were beyond the occlusion, the micro wire was removed and stentreiver device was deployed, stripping the microcatheter from the wire. After the device was deployed across the occlusion, the intermediate catheter was again attempted to advanced to the M1 segment. This was unsuccessful, and would not advance beyond the ophthalmic segment. The balloon at the balloon guide was inflated to profile of the vessel. Local aspiration was performed at the intermediate catheter upon withdrawal of the device under fluoroscopic observation, with aspiration at the balloon guide. Once the retrieve her and intermediate catheter were entirely removed from the system, free aspiration was confirmed at the hub of the intermediate catheter, with free blood return confirmed. Control angiogram was again performed. Improved flow was confirmed, TICI 2b. Balloon guide was withdrawn and angiogram of the cervical segment was performed. We then removed the balloon guide catheter and deployed an 8 French Angio-Seal at the right common femoral artery access. Angiogram was performed of the right sided access and the left-sided  access before completing the case. Patient tolerated the procedure well and remained hemodynamically stable throughout. No complications were encountered. Estimated blood loss approximately 50 cc. IMPRESSION: Status post ultrasound guided access right common femoral artery for cerebral angiogram and a combination of aspiration and mechanical thrombectomy of left M1 emergent large vessel occlusion, and restoration of TICI 2b flow after 2 passes. Status post balloon angioplasty  to 6 mm of proximal right common iliac artery for treatment of a restrictive lesion in order to pass 8 French sheath to perform the thrombectomy. Status post deployment of Angio-Seal at the right common femoral artery. At the conclusion of the case, a 4 [place] sheath remains in left common femoral artery for arterial monitoring. Signed, Dulcy Fanny. Dellia Nims, RPVI [facility] Radiology PLAN: Formal CT after the case Right hip straight overnight status post 8 French device and Angio-Seal closure Left common femoral artery for French sheath may be removed when hemodynamic monitoring is no longer needed with manual pressure. Goal blood pressure 676 [id number] systolic given the incomplete flow restoration Electronically Signed   By: Corrie Mckusick D.O.   On: 03/15/2018 20:39   Ir US Guide Vasc Access Right  Result Date: 03/14/2018 INDICATION: 73 year old female with a history of acute left MCA stroke. She presents within time frame for IV tPA, and is a candidate for mechanical thrombectomy EXAM: ULTRASOUND GUIDED ACCESS RIGHT COMMON FEMORAL ARTERY ULTRASOUND GUIDED ACCESS LEFT COMMON FEMORAL ARTERY FOR ARTERIAL MONITORING ANGIOGRAM RIGHT LOWER EXTREMITY BALLOON ANGIOPLASTY STENOSIS RIGHT COMMON ILIAC ARTERY IN ORDER TO PASS 8 FRENCH SHEATH TO COMPLETE THE THROMBECTOMY CEREBRAL ANGIOGRAM MECHANICAL THROMBECTOMY LEFT MCA EMERGENT LARGE VESSEL OCCLUSION DEPLOYMENT OF ANGIO-SEAL RIGHT COMMON FEMORAL  ARTERY COMPARISON:  CT imaging of the same day MEDICATIONS: 2 g Ancef IV. The antibiotic was administered within 1 hour of the procedure ANESTHESIA/SEDATION: General endotracheal tube anesthesia CONTRAST:  96 cc Isovue-300 FLUOROSCOPY TIME:  Fluoroscopy Time: 26 minutes 24 seconds (1,362 mGy). COMPLICATIONS: None TECHNIQUE: Informed written consent was obtained from the patient's family after a thorough discussion of the procedural risks, benefits and alternatives. Specific risks discussed include: Bleeding, infection, contrast reaction, kidney injury/failure, need for further procedure/surgery, arterial injury or dissection, embolization to new territory, intracranial hemorrhage (10-15% risk), neurologic deterioration, cardiopulmonary collapse, death. All questions were addressed. Maximal Sterile Barrier Technique was utilized including during the procedure including caps, mask, sterile gowns, sterile gloves, sterile drape, hand hygiene and skin antiseptic. A timeout was performed prior to the initiation of the procedure. The anesthesia team was present to provide general endotracheal tube anesthesia and for patient monitoring during the procedure. Interventional neuro radiology nursing staff was also present. 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 Surgical changes of prior endarterectomy evident on prior CT imaging. Left external carotid artery: Patent with antegrade flow. Left internal carotid artery: Normal course caliber and contour of the cervical portion. Vertical and petrous segment patent with normal course caliber contour. Cavernous segment patent. Clinoid segment patent. Antegrade flow of the ophthalmic artery. Ophthalmic segment patent. Terminus patent. Left MCA: Distal M1 occluded at the initiation of the case. There is partial filling of an inferior branch which is the non  dominant branch to the frontal. Patent fetal PCA to the left P2 segment. Left ACA: A 1 segment patent. A 2 segment perfuses the right territory. Completion Findings: Left MCA: After 2 passes of mechanical thrombectomy plus aspiration, there is near complete restoration of flow through the left MCA territory, with slow flow through an M3/M4 branch of the parietal region compatible with TI CI 2b flow. 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 Physical exam At the initiation of the case, palpable bilateral common femoral artery pulses present No palpable distal pulses Doppler positive pulses of the right posterior tibial  and anterior tibial (with systolic blood pressures above 200) Doppler positive pulse at the left posterior tibial (with systolic blood pressure above 200), with no Doppler signal at the left anterior tibial At completion of case Doppler positive pulses were only discovered on the right posterior tibial and anterior tibial arteries with blood pressures above 200. In the 140-160 range (goal) Doppler signal was not evident A completion of case Doppler positive pulses were only discovered on the left at the posterior tibial and not the left anterior tibial (with the pressure above 664 systolic). PROCEDURE: Patient is brought emergently to the neuro angiography suite, with the patient identified appropriately and placed supine position on the table. Left radial arterial line was attempted by the anesthesia team. The patient is then prepped and draped in the usual sterile fashion. Ultrasound survey of the right inguinal region was performed with images stored and sent to PACs. 11 blade scalpel was used to make a small incision. Blunt dissection was performed. A micropuncture needle was used access the right common femoral artery under ultrasound. With excellent arterial blood flow returned, an .018 micro wire was passed through the needle, observed to enter the  abdominal aorta under fluoroscopy. The needle was removed, and a micropuncture sheath was placed over the wire. The inner dilator and wire were removed, and an 035 Bentson wire was advanced under fluoroscopy into the abdominal aorta. The sheath was removed and a standard 5 [place] vascular sheath was placed. The dilator was removed and the sheath was flushed. Ultrasound survey of the left inguinal region was then performed with images stored and sent to PACs, confirming patency of the vessel. A micropuncture needle was used access the left common femoral artery under ultrasound. With excellent arterial blood flow returned, and an .018 micro wire was passed through the needle, observed enter the abdominal aorta under fluoroscopy. The needle was removed, and a micropuncture sheath was placed over the wire. The inner dilator and wire were removed, and an 035 Bentson wire was advanced under fluoroscopy into the abdominal aorta. The sheath was removed and a standard 4 [place] vascular sheath was placed for arterial monitoring. The dilator was removed and the sheath was flushed. A 55F JB-1 diagnostic catheter was then advanced over the wire through the existing right femoral access to the proximal descending thoracic aorta. Wire was then removed. Double flush of the catheter was performed. Catheter was then used to select the left cervical internal carotid artery. Formal angiogram was performed, with roadmap achieved. Exchange length Rosen wire was then passed through the diagnostic catheter to the distal cervical ICA and the diagnostic catheter was removed. The 5 French sheath was removed, with attempt at placing 8 French 55 cm bright tip sheath. Significant resistance was encountered with passing the sheath through the right iliac system. The sheath would not advanced through the pre-existing right common iliac artery stent. Sheath was withdrawn and rotated. We attempted to advance the sheath over the introducer which was  pinned on the wire. We also removed the introducer in place to diagnostic catheter into the aorta and attempted to pass the sheath over the diagnostic catheter. None of these maneuvers were successful. Sheath was then withdrawn into the external iliac artery. Balloon angioplasty was performed of the pre-existing right common iliac artery stent with 5 mm x 40 mm standard balloon angioplasty. Balloon was removed and the introducer was then replaced. Eight French sheath was then again attempted to pass over the wire, unsuccessful through this segment  of the iliac artery. The introducer was removed, diagnostic catheter was placed into the cervical segment of the internal carotid artery, and the Rose an wire was exchanged for a 260 cm Amplatz wire. With the stiff wire in place, another attempt was made to pass the sheath which was unsuccessful. Sheath was then again withdrawn to the external iliac artery, introducer was removed, and a second balloon angioplasty 6 mm x 40 mm was performed at the pre-existing stent. The sheath was then successfully advanced over the balloon as the balloon was deflated, into the abdominal aorta. The introducer was again placed after removing the balloon and the sheath was advanced 6 successfully into the aorta. Introducer was withdrawn. Once the sheath was advanced over the wire to the thoracic aorta, sheath was flushed and attached to pressurized and heparinized saline bag for constant forward flow. Eight French 95 cm flow gate balloon catheter was then advanced over the wire to the distal cervical segment. Wire was removed. Then a coaxial intermediate catheter and microcatheter combination was prepared on the back table. This combination was penumbra Ace 64 catheter and a Trevo Provue18 microcatheter, with a synchro soft wire. This combination was then advanced through the balloon guide into the ICA. System was advanced into the internal carotid artery, to the level of the occlusion. The  micro wire was then carefully advanced through the occluded segment, with a distal knuckle configuration. Microcatheter was then pushed through the occluded segment and the wire was removed. Intermediate catheter was advanced over the micro wire to the carotid siphon, which would not advance beyond the ophthalmic segment through the terminus. Blood was then aspirated through the hub of the microcatheter, and a gentle contrast injection was performed confirming intraluminal position. A rotating hemostatic valve was then attached to the back end of the microcatheter, and a pressurized and heparinized saline bag was attached to the catheter. 4 mm x 40 mm solitaire device was then selected. Back flush was achieved at the rotating hemostatic valve, and then the device was gently advanced through the microcatheter to the distal end. The retriever was then unsheathed by withdrawing the microcatheter under fluoroscopy. Once the retriever was completely unsheathed, the microcatheter was carefully stripped from the delivery wire of the device. Control angiogram was then performed from the balloon catheter. 3 minute time interval was observed. The balloon on the balloon guide was then inflated to profile of the vessel. Constant aspiration was then performed through the intermediate catheter, and constant gentle aspiration was performed at the balloon guide. This aspiration was continued as the retriever was gently and slowly withdrawn with fluoroscopic observation into the distal intermediate catheter. The entire system was then gently withdrawn from the intracranial ICA and into the balloon guide. Once the retriever was entirely removed from the system, free aspiration was confirmed at the hub of the balloon guide, with free blood return confirmed. Control angiogram was then performed. TICI 2a flow was achieved. Repeat angiogram with multiple angulation demonstrated persistent occlusion of parietal branch. The same coaxial  system was again passed through the balloon guide catheter. The microcatheter system was then advanced through the occlusion of the parietal branch. Once the micro wire microcatheter were beyond the occlusion, the micro wire was removed and stentreiver device was deployed, stripping the microcatheter from the wire. After the device was deployed across the occlusion, the intermediate catheter was again attempted to advanced to the M1 segment. This was unsuccessful, and would not advance beyond the ophthalmic segment. The  balloon at the balloon guide was inflated to profile of the vessel. Local aspiration was performed at the intermediate catheter upon withdrawal of the device under fluoroscopic observation, with aspiration at the balloon guide. Once the retrieve her and intermediate catheter were entirely removed from the system, free aspiration was confirmed at the hub of the intermediate catheter, with free blood return confirmed. Control angiogram was again performed. Improved flow was confirmed, TICI 2b. Balloon guide was withdrawn and angiogram of the cervical segment was performed. We then removed the balloon guide catheter and deployed an 8 French Angio-Seal at the right common femoral artery access. Angiogram was performed of the right sided access and the left-sided access before completing the case. Patient tolerated the procedure well and remained hemodynamically stable throughout. No complications were encountered. Estimated blood loss approximately 50 cc. IMPRESSION: Status post ultrasound guided access right common femoral artery for cerebral angiogram and a combination of aspiration and mechanical thrombectomy of left M1 emergent large vessel occlusion, and restoration of TICI 2b flow after 2 passes. Status post balloon angioplasty to 6 mm of proximal right common iliac artery for treatment of a restrictive lesion in order to pass 8 French sheath to perform the thrombectomy. Status post deployment of  Angio-Seal at the right common femoral artery. At the conclusion of the case, a 4 [place] sheath remains in left common femoral artery for arterial monitoring. Signed, Dulcy Fanny. Dellia Nims, RPVI [facility] Radiology [facility] Radiology PLAN: Formal CT after the case Right hip straight overnight status post 8 French device and Angio-Seal closure Left common femoral artery for French sheath may be removed when hemodynamic monitoring is no longer needed with manual pressure. Goal blood pressure 092 [id number] systolic given the incomplete flow restoration Electronically Signed   By: Corrie Mckusick D.O.   On: 03/20/2018 20:39   Dg Chest Port 1 View  Result Date: 03/10/2018 CLINICAL DATA:  Cerebral infarction and respiratory failure. EXAM: PORTABLE CHEST 1 VIEW COMPARISON:  03/23/2018 FINDINGS: Endotracheal tube remains present with the tip approximately 3 cm above the carina. Gastric decompression tube has been advanced and now extends further into the stomach. Stable significant cardiac enlargement. Lungs show some improved aeration at the right base with mild residual right basilar atelectasis remaining. Left lower lobe atelectasis is relatively stable. No pulmonary edema, significant pleural fluid or pneumothorax. IMPRESSION: Advancement of gastric decompression tube further into the stomach. Improved aeration at the right lung base with residual mild right basilar atelectasis. Stable left lower lobe atelectasis. Electronically Signed   By: Aletta Edouard M.D.   On: 03/10/2018 08:18   Portable Chest X-ray  Result Date: 03/25/2018 CLINICAL DATA:  Intubated EXAM: PORTABLE CHEST 1 VIEW COMPARISON:  06/01/2017 chest radiograph. FINDINGS: Endotracheal tube tip is 4.2 cm above the carina. Enteric tube terminates at the esophagogastric junction with the side port in the lower thoracic esophagus. Stable cardiomediastinal silhouette with moderate cardiomegaly. No pneumothorax. No pleural  effusion. Hazy lower parahilar lung opacities, most prominent in the right lower lung, worsened. IMPRESSION: 1. Well-positioned endotracheal tube. 2. Enteric tube terminates at the esophagogastric junction with the side port in the lower thoracic esophagus, consider advancing 8-10 cm. 3. Moderate cardiomegaly. Worsening hazy lower parahilar lung opacities, most prominent in the right lower lung, favor pulmonary edema. Electronically Signed   By: Ilona Sorrel M.D.   On: 03/12/2018 22:12   Ir Percutaneous Art Thrombectomy/infusion Intracranial Inc Diag Angio  Result Date: 03/22/2018 INDICATION: 73 year old female with a history  of acute left MCA stroke. She presents within time frame for IV tPA, and is a candidate for mechanical thrombectomy EXAM: ULTRASOUND GUIDED ACCESS RIGHT COMMON FEMORAL ARTERY ULTRASOUND GUIDED ACCESS LEFT COMMON FEMORAL ARTERY FOR ARTERIAL MONITORING ANGIOGRAM RIGHT LOWER EXTREMITY BALLOON ANGIOPLASTY STENOSIS RIGHT COMMON ILIAC ARTERY IN ORDER TO PASS 8 FRENCH SHEATH TO COMPLETE THE THROMBECTOMY CEREBRAL ANGIOGRAM MECHANICAL THROMBECTOMY LEFT MCA EMERGENT LARGE VESSEL OCCLUSION DEPLOYMENT OF ANGIO-SEAL RIGHT COMMON FEMORAL ARTERY COMPARISON:  CT imaging of the same day MEDICATIONS: 2 g Ancef IV. The antibiotic was administered within 1 hour of the procedure ANESTHESIA/SEDATION: General endotracheal tube anesthesia CONTRAST:  96 cc Isovue-300 FLUOROSCOPY TIME:  Fluoroscopy Time: 26 minutes 24 seconds (1,362 mGy). COMPLICATIONS: None TECHNIQUE: Informed written consent was obtained from the patient's family after a thorough discussion of the procedural risks, benefits and alternatives. Specific risks discussed include: Bleeding, infection, contrast reaction, kidney injury/failure, need for further procedure/surgery, arterial injury or dissection, embolization to new territory, intracranial hemorrhage (10-15% risk), neurologic deterioration, cardiopulmonary collapse, death. All questions were  addressed. Maximal Sterile Barrier Technique was utilized including during the procedure including caps, mask, sterile gowns, sterile gloves, sterile drape, hand hygiene and skin antiseptic. A timeout was performed prior to the initiation of the procedure. The anesthesia team was present to provide general endotracheal tube anesthesia and for patient monitoring during the procedure. Interventional neuro radiology nursing staff was also present. 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 Surgical changes of prior endarterectomy evident on prior CT imaging. Left external carotid artery: Patent with antegrade flow. Left internal carotid artery: Normal course caliber and contour of the cervical portion. Vertical and petrous segment patent with normal course caliber contour. Cavernous segment patent. Clinoid segment patent. Antegrade flow of the ophthalmic artery. Ophthalmic segment patent. Terminus patent. Left MCA: Distal M1 occluded at the initiation of the case. There is partial filling of an inferior branch which is the non dominant branch to the frontal. Patent fetal PCA to the left P2 segment. Left ACA: A 1 segment patent. A 2 segment perfuses the right territory. Completion Findings: Left MCA: After 2 passes of mechanical thrombectomy plus aspiration, there is near complete restoration of flow through the left MCA territory, with slow flow through an M3/M4 branch of the parietal region compatible with TI CI 2b flow. 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 Physical exam At the initiation of the case, palpable bilateral common femoral artery pulses present No palpable distal pulses Doppler positive pulses of the right posterior tibial and anterior tibial (with systolic blood pressures above 200) Doppler positive pulse at the left posterior tibial (with  systolic blood pressure above 200), with no Doppler signal at the left anterior tibial At completion of case Doppler positive pulses were only discovered on the right posterior tibial and anterior tibial arteries with blood pressures above 200. In the 140-160 range (goal) Doppler signal was not evident A completion of case Doppler positive pulses were only discovered on the left at the posterior tibial and not the left anterior tibial (with the pressure above 299 systolic). PROCEDURE: Patient is brought emergently to the neuro angiography suite, with the patient identified appropriately and placed supine position on the table. Left radial arterial line was attempted by the anesthesia team. The patient is then prepped and draped in the usual sterile fashion. Ultrasound survey of the right inguinal region was performed with images  stored and sent to PACs. 11 blade scalpel was used to make a small incision. Blunt dissection was performed. A micropuncture needle was used access the right common femoral artery under ultrasound. With excellent arterial blood flow returned, an .018 micro wire was passed through the needle, observed to enter the abdominal aorta under fluoroscopy. The needle was removed, and a micropuncture sheath was placed over the wire. The inner dilator and wire were removed, and an 035 Bentson wire was advanced under fluoroscopy into the abdominal aorta. The sheath was removed and a standard 5 [place] vascular sheath was placed. The dilator was removed and the sheath was flushed. Ultrasound survey of the left inguinal region was then performed with images stored and sent to PACs, confirming patency of the vessel. A micropuncture needle was used access the left common femoral artery under ultrasound. With excellent arterial blood flow returned, and an .018 micro wire was passed through the needle, observed enter the abdominal aorta under fluoroscopy. The needle was removed, and a micropuncture sheath was  placed over the wire. The inner dilator and wire were removed, and an 035 Bentson wire was advanced under fluoroscopy into the abdominal aorta. The sheath was removed and a standard 4 [place] vascular sheath was placed for arterial monitoring. The dilator was removed and the sheath was flushed. A 41F JB-1 diagnostic catheter was then advanced over the wire through the existing right femoral access to the proximal descending thoracic aorta. Wire was then removed. Double flush of the catheter was performed. Catheter was then used to select the left cervical internal carotid artery. Formal angiogram was performed, with roadmap achieved. Exchange length Rosen wire was then passed through the diagnostic catheter to the distal cervical ICA and the diagnostic catheter was removed. The 5 French sheath was removed, with attempt at placing 8 French 55 cm bright tip sheath. Significant resistance was encountered with passing the sheath through the right iliac system. The sheath would not advanced through the pre-existing right common iliac artery stent. Sheath was withdrawn and rotated. We attempted to advance the sheath over the introducer which was pinned on the wire. We also removed the introducer in place to diagnostic catheter into the aorta and attempted to pass the sheath over the diagnostic catheter. None of these maneuvers were successful. Sheath was then withdrawn into the external iliac artery. Balloon angioplasty was performed of the pre-existing right common iliac artery stent with 5 mm x 40 mm standard balloon angioplasty. Balloon was removed and the introducer was then replaced. Eight French sheath was then again attempted to pass over the wire, unsuccessful through this segment of the iliac artery. The introducer was removed, diagnostic catheter was placed into the cervical segment of the internal carotid artery, and the Rose an wire was exchanged for a 260 cm Amplatz wire. With the stiff wire in place, another  attempt was made to pass the sheath which was unsuccessful. Sheath was then again withdrawn to the external iliac artery, introducer was removed, and a second balloon angioplasty 6 mm x 40 mm was performed at the pre-existing stent. The sheath was then successfully advanced over the balloon as the balloon was deflated, into the abdominal aorta. The introducer was again placed after removing the balloon and the sheath was advanced 6 successfully into the aorta. Introducer was withdrawn. Once the sheath was advanced over the wire to the thoracic aorta, sheath was flushed and attached to pressurized and heparinized saline bag for constant forward flow. Eight [place] 95  cm flow gate balloon catheter was then advanced over the wire to the distal cervical segment. Wire was removed. Then a coaxial intermediate catheter and microcatheter combination was prepared on the back table. This combination was penumbra Ace 64 catheter and a Trevo Provue18 microcatheter, with a synchro soft wire. This combination was then advanced through the balloon guide into the ICA. System was advanced into the internal carotid artery, to the level of the occlusion. The micro wire was then carefully advanced through the occluded segment, with a distal knuckle configuration. Microcatheter was then pushed through the occluded segment and the wire was removed. Intermediate catheter was advanced over the micro wire to the carotid siphon, which would not advance beyond the ophthalmic segment through the terminus. Blood was then aspirated through the hub of the microcatheter, and a gentle contrast injection was performed confirming intraluminal position. A rotating hemostatic valve was then attached to the back end of the microcatheter, and a pressurized and heparinized saline bag was attached to the catheter. 4 mm x 40 mm solitaire device was then selected. Back flush was achieved at the rotating hemostatic valve, and then the device was gently  advanced through the microcatheter to the distal end. The retriever was then unsheathed by withdrawing the microcatheter under fluoroscopy. Once the retriever was completely unsheathed, the microcatheter was carefully stripped from the delivery wire of the device. Control angiogram was then performed from the balloon catheter. 3 minute time interval was observed. The balloon on the balloon guide was then inflated to profile of the vessel. Constant aspiration was then performed through the intermediate catheter, and constant gentle aspiration was performed at the balloon guide. This aspiration was continued as the retriever was gently and slowly withdrawn with fluoroscopic observation into the distal intermediate catheter. The entire system was then gently withdrawn from the intracranial ICA and into the balloon guide. Once the retriever was entirely removed from the system, free aspiration was confirmed at the hub of the balloon guide, with free blood return confirmed. Control angiogram was then performed. TICI 2a flow was achieved. Repeat angiogram with multiple angulation demonstrated persistent occlusion of parietal branch. The same coaxial system was again passed through the balloon guide catheter. The microcatheter system was then advanced through the occlusion of the parietal branch. Once the micro wire microcatheter were beyond the occlusion, the micro wire was removed and stentreiver device was deployed, stripping the microcatheter from the wire. After the device was deployed across the occlusion, the intermediate catheter was again attempted to advanced to the M1 segment. This was unsuccessful, and would not advance beyond the ophthalmic segment. The balloon at the balloon guide was inflated to profile of the vessel. Local aspiration was performed at the intermediate catheter upon withdrawal of the device under fluoroscopic observation, with aspiration at the balloon guide. Once the retrieve her and  intermediate catheter were entirely removed from the system, free aspiration was confirmed at the hub of the intermediate catheter, with free blood return confirmed. Control angiogram was again performed. Improved flow was confirmed, TICI 2b. Balloon guide was withdrawn and angiogram of the cervical segment was performed. We then removed the balloon guide catheter and deployed an 8 French Angio-Seal at the right common femoral artery access. Angiogram was performed of the right sided access and the left-sided access before completing the case. Patient tolerated the procedure well and remained hemodynamically stable throughout. No complications were encountered. Estimated blood loss approximately 50 cc. IMPRESSION: Status post ultrasound guided access right common  femoral artery for cerebral angiogram and a combination of aspiration and mechanical thrombectomy of left M1 emergent large vessel occlusion, and restoration of TICI 2b flow after 2 passes. Status post balloon angioplasty to 6 mm of proximal right common iliac artery for treatment of a restrictive lesion in order to pass 8 French sheath to perform the thrombectomy. Status post deployment of Angio-Seal at the right common femoral artery. At the conclusion of the case, a 4 [place] sheath remains in left common femoral artery for arterial monitoring. Signed, Dulcy Fanny. Dellia Nims, RPVI [facility] Radiology PLAN: Formal CT after the case Right hip straight overnight status post 8 French device and Angio-Seal closure Left common femoral artery for French sheath may be removed when hemodynamic monitoring is no longer needed with manual pressure. Goal blood pressure 914 [id number] systolic given the incomplete flow restoration Electronically Signed   By: Corrie Mckusick D.O.   On: 03/16/2018 20:39   Ct Head Code Stroke Wo Contrast  Result Date: 03/22/2018 CLINICAL DATA:  Code stroke.  Deficit not specified EXAM: CT  HEAD WITHOUT CONTRAST TECHNIQUE: Contiguous axial images were obtained from the base of the skull through the vertex without intravenous contrast. COMPARISON:  Head CT 07/18/2016 and brain MRI 07/20/2016 FINDINGS: Brain: Multiple remote lacunar infarcts in the deep white matter and deep gray nuclei, chronic when compared to prior head CT and brain MRI. Small remote left cerebellar infarct. No evidence of acute infarct. No hemorrhage or hydrocephalus. Vascular: No hyperdense vessel. Skull: Normal. Negative for fracture or focal lesion. Sinuses/Orbits: No acute finding. Other: These results were communicated to Dr. Rory Percy at 4:43 pmon 01/23/2020by text page via the [facility] messaging system. ASPECTS [facility] Stroke Program Early CT Score) Not scored without localizing symptoms. Motion artifact at the vertex blurring cortex. IMPRESSION: 1. No acute finding. 2. Motion artifact at the vertex. 3. Multiple remote small vessel infarcts. Electronically Signed   By: Monte Fantasia M.D.   On: 03/10/2018 16:45   Vas [place] Lower Extremity Venous (dvt)  Result Date: 03/09/2018  Lower Venous Study Indications: Stroke.  Limitations: Body habitus. Performing Technologist: Maudry Mayhew MHA, RDMS, RVT, RDCS  Examination Guidelines: A complete evaluation includes B-mode imaging, spectral Doppler, color Doppler, and power Doppler as needed of all accessible portions of each vessel. Bilateral testing is considered an integral part of a complete examination. Limited examinations for reoccurring indications may be performed as noted.  Right Venous Findings: +---------+---------------+---------+-----------+----------+--------------+          CompressibilityPhasicitySpontaneityPropertiesSummary        +---------+---------------+---------+-----------+----------+--------------+ CFV                                                   Not visualized +---------+---------------+---------+-----------+----------+--------------+ SFJ                                                    Not visualized +---------+---------------+---------+-----------+----------+--------------+ FV Prox  Full                                                        +---------+---------------+---------+-----------+----------+--------------+  FV Mid   Full                                                        +---------+---------------+---------+-----------+----------+--------------+ FV DistalFull                                                        +---------+---------------+---------+-----------+----------+--------------+ PFV      Full                                                        +---------+---------------+---------+-----------+----------+--------------+ POP      Full           Yes      Yes                                 +---------+---------------+---------+-----------+----------+--------------+ PTV      Full                                                        +---------+---------------+---------+-----------+----------+--------------+ PERO                                                  Not visualized +---------+---------------+---------+-----------+----------+--------------+  Left Venous Findings: +---------+---------------+---------+-----------+----------+-------+          CompressibilityPhasicitySpontaneityPropertiesSummary +---------+---------------+---------+-----------+----------+-------+ CFV      Full           Yes      Yes                          +---------+---------------+---------+-----------+----------+-------+ SFJ      Full                                                 +---------+---------------+---------+-----------+----------+-------+ FV Prox  Full                                                 +---------+---------------+---------+-----------+----------+-------+ FV Mid   Full                                                  +---------+---------------+---------+-----------+----------+-------+ FV DistalFull                                                 +---------+---------------+---------+-----------+----------+-------+  PFV      Full                                                 +---------+---------------+---------+-----------+----------+-------+ POP      Full           Yes      Yes                          +---------+---------------+---------+-----------+----------+-------+ PTV      Full                                                 +---------+---------------+---------+-----------+----------+-------+ PERO     Full                                                 +---------+---------------+---------+-----------+----------+-------+    Summary: Right: There is no evidence of deep vein thrombosis in the lower extremity. However, portions of this examination were limited- see technologist comments above. No cystic structure found in the popliteal fossa. Left: There is no evidence of deep vein thrombosis in the lower extremity. No cystic structure found in the popliteal fossa.  *See table(s) above for measurements and observations. Electronically signed by Curt Jews MD on 03/09/2018 at 5:40:37 PM.    Final     Labs:  CBC: Recent Labs    06/03/17 0734 03/13/2018 1642 03/16/2018 1656 03/09/18 1040 03/10/18 0503  WBC 5.7  --  6.3 6.6 8.4  HGB 10.8* 13.9 12.2 9.5* 9.0*  HCT 33.2* 41.0 38.5 30.1* 28.8*  PLT 139*  --  174 150 145*    COAGS: Recent Labs    04/07/2018 1656  INR 1.00  APTT 20*    BMP: Recent Labs    06/03/17 0734 03/21/2018 1642 03/25/2018 1656 03/09/18 1055 03/10/18 0503  NA 139 138 140 139 141  K 3.8 3.8 3.3* 3.6 4.1  CL 108 102 101 107 110  CO2 24  --  25 21* 20*  GLUCOSE 142* 180* 191* 174* 171*  BUN 28* 32* 32* 25* 32*  CALCIUM 8.8*  --  9.3 8.1* 8.1*  CREATININE 1.59* 2.20* 2.25* 2.06* 2.97*  GFRNONAA 31*  --  21* 23* 15*  GFRAA 36*  --  24* 27* 17*     LIVER FUNCTION TESTS: Recent Labs    03/25/2018 1656 03/09/18 1055 03/10/18 0503  BILITOT 0.7  --   --   AST 27  --   --   ALT 26  --   --   ALKPHOS 97  --   --   PROT 7.4  --   --   ALBUMIN 3.0* 2.1* 2.1*    Assessment and Plan:  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 Earleen Newport. 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 Earleen Newport. Patient's condition stable- remains intubated/sedated, withdraws all extremities from pain. Right groin incision stable. Left femoral arterial line stable. Appreciate and agree with neurology management. IR to follow.   Electronically  Signed: Earley Abide, PA-C 03/10/2018, 10:07 AM   I spent a total of 25 Minutes at the the patient's bedside AND on the patient's hospital floor or unit, greater than 50% of which was counseling/coordinating care for left MCA M1 segment occlusion and right common iliac artery stenosis s/p revascularization.

## 2018-03-10 NOTE — Progress Notes (Signed)
STROKE TEAM PROGRESS NOTE   SUBJECTIVE (INTERVAL HISTORY) Her two daughters are at the bedside.  She was still intubated, on sedation.  BP from left femoral line for blood pressure monitoring stable.  MRI showed left MCA patchy small sized infarcts. Not able to extubate today.    OBJECTIVE Temp:  [98.3 F (36.8 C)-100.8 F (38.2 C)] 99.1 F (37.3 C) (01/03 0900) Pulse Rate:  [61-110] 61 (01/03 1000) Cardiac Rhythm: Normal sinus rhythm;Sinus tachycardia (01/03 0800) Resp:  [16-26] 16 (01/03 1000) BP: (157-161)/(50-53) 161/53 (01/03 0805) SpO2:  [94 %-100 %] 100 % (01/03 1000) Arterial Line BP: (119-176)/(39-63) 121/39 (01/03 1000) FiO2 (%):  [30 %-40 %] 30 % (01/03 0810)  Recent Labs  Lab 03/09/18 1517 03/09/18 1955 03/09/18 2319 03/10/18 0327 03/10/18 0747  GLUCAP 175* 161* 182* 159* 142*   Recent Labs  Lab 03/30/2018 1642 03/27/2018 1656 03/09/18 1040 03/09/18 1055 03/10/18 0503  NA 138 140  --  139 141  K 3.8 3.3*  --  3.6 4.1  CL 102 101  --  107 110  CO2  --  25  --  21* 20*  GLUCOSE 180* 191*  --  174* 171*  BUN 32* 32*  --  25* 32*  CREATININE 2.20* 2.25*  --  2.06* 2.97*  CALCIUM  --  9.3  --  8.1* 8.1*  MG  --   --  1.9  --  2.1  PHOS  --   --   --  5.4* 6.0*   Recent Labs  Lab 04/03/2018 1656 03/09/18 1055 03/10/18 0503  AST 27  --   --   ALT 26  --   --   ALKPHOS 97  --   --   BILITOT 0.7  --   --   PROT 7.4  --   --   ALBUMIN 3.0* 2.1* 2.1*   Recent Labs  Lab 04/04/2018 1642 03/24/2018 1656 03/09/18 1040 03/10/18 0503  WBC  --  6.3 6.6 8.4  NEUTROABS  --  3.2  --   --   HGB 13.9 12.2 9.5* 9.0*  HCT 41.0 38.5 30.1* 28.8*  MCV  --  94.1 95.3 96.0  PLT  --  174 150 145*   Recent Labs  Lab 03/09/18 0019 03/09/18 0432 03/09/18 1040 03/09/18 1526 03/09/18 2224  TROPONINI 6.47* 5.08* 4.70* 3.81* 3.88*   Recent Labs    03/20/2018 1656  LABPROT 13.1  INR 1.00   No results for input(s): COLORURINE, LABSPEC, PHURINE, GLUCOSEU, HGBUR,  BILIRUBINUR, KETONESUR, PROTEINUR, UROBILINOGEN, NITRITE, LEUKOCYTESUR in the last 72 hours.  Invalid input(s): APPERANCEUR     Component Value Date/Time   CHOL 141 03/09/2018 0432   TRIG 120 03/09/2018 0432   HDL 25 (L) 03/09/2018 0432   CHOLHDL 5.6 03/09/2018 0432   VLDL 24 03/09/2018 0432   LDLCALC 92 03/09/2018 0432   Lab Results  Component Value Date   HGBA1C 7.1 (H) 03/09/2018   No results found for: LABOPIA, COCAINSCRNUR, LABBENZ, AMPHETMU, THCU, LABBARB  No results for input(s): ETH in the last 168 hours.  I have personally reviewed the radiological images below and agree with the radiology interpretations.  Ct Angio Head W Or Wo Contrast  Result Date: 04/01/2018 CLINICAL DATA:  Code stroke, deficit not specified EXAM: CT ANGIOGRAPHY HEAD AND NECK TECHNIQUE: Multidetector CT imaging of the head and neck was performed using the standard protocol during bolus administration of intravenous contrast. Multiplanar CT image reconstructions and MIPs were obtained to  evaluate the vascular anatomy. Carotid stenosis measurements (when applicable) are obtained utilizing NASCET criteria, using the distal internal carotid diameter as the denominator. CONTRAST:  77mL ISOVUE-370 IOPAMIDOL (ISOVUE-370) INJECTION 76% COMPARISON:  None. FINDINGS: CTA NECK FINDINGS Aortic arch: Extensive atherosclerotic calcification. Three vessel branching Right carotid system: Diffuse mainly calcified atherosclerotic plaque which limits visualization of the lumen due to plaque blooming. There is distal common carotid stenosis that measures up to 70% (when compared to the more proximal vessel given the immediate downstream bifurcation). There is ICA bulb plaque with 70% stenosis. Left carotid system: Much less extensive atherosclerotic plaque, question prior carotid endarterectomy. No stenosis or ulceration. Vertebral arteries: Severe bilateral proximal subclavian stenosis, with possible occlusion on the right. Narrowing  on the left involves and extensive segment. No flow is seen within vertebral arteries until the V3 segments, there may be retrograde flow Skeleton: Diffuse degenerative disease.  No acute finding Other neck: Bilateral cataract resection. Upper chest: Hazy density of the lungs which could be from atelectasis. No Kerley lines to implicate edema. Review of the MIP images confirms the above findings CTA HEAD FINDINGS Anterior circulation: Confluent atherosclerotic calcification on the carotid siphons with small vessel size and plaque blooming limiting luminal visualization. There is a left M2 branch occlusion beginning at the M1 bifurcation with subsequent downstream reconstitution. No contralateral embolism is seen. Posterior circulation: Tiny vertebrobasilar arteries. There may be a basilar interruption from atherosclerosis or developmental variant. Fetal type flow to both posterior cerebral arteries which are symmetrically patent Venous sinuses: Patent as permitted by contrast timing Anatomic variants: As above Delayed phase: Not obtained in the emergent setting Review of the MIP images confirms the above findings Case discussed with Dr. Rory Percy at 5:04 p.m. IMPRESSION: 1. Left M2 branch occlusion beginning at the M1 bifurcation. 2. Severe atherosclerosis which preferentially spares the left carotid circulation, question prior endarterectomy. 3. Severe atheromatous narrowing if not occlusion of bilateral proximal subclavian arteries. There is bilateral fetal type PCA with diffuse thready flow in the posterior fossa. 4. 70% atheromatous narrowing of the right common carotid and ICA bulb. Electronically Signed   By: Monte Fantasia M.D.   On: 03/09/2018 17:16   Ct Head Wo Contrast  Result Date: 03/25/2018 CLINICAL DATA:  Post thrombectomy EXAM: CT HEAD WITHOUT CONTRAST TECHNIQUE: Contiguous axial images were obtained from the base of the skull through the vertex without intravenous contrast. COMPARISON:  Head CT from  earlier today FINDINGS: Brain: 3 mm focus of high density along the high and posterior right frontal convexity, on reformats this appears to be intraparenchymal. No hemorrhagic conversion or visible infarct in the area of procedure. Multiple remote small vessel infarcts. Vascular: Major vessels are symmetrically enhancing. Skull: Negative Sinuses/Orbits: Negative These results were called by telephone at the time of interpretation on 03/24/2018 at 8:17 pm to Dr. Cheral Marker , who verbally acknowledged these results. IMPRESSION: New 3 mm high-density within the posterior right frontal cortex which could be focus of hemorrhage or an enhancing subacute infarct. No hemorrhage or infarct seen along the postoperative left MCA distribution. Electronically Signed   By: Monte Fantasia M.D.   On: 03/11/2018 20:18   Ct Angio Neck W Or Wo Contrast  Result Date: 04/03/2018 CLINICAL DATA:  Code stroke, deficit not specified EXAM: CT ANGIOGRAPHY HEAD AND NECK TECHNIQUE: Multidetector CT imaging of the head and neck was performed using the standard protocol during bolus administration of intravenous contrast. Multiplanar CT image reconstructions and MIPs were obtained to evaluate the vascular  anatomy. Carotid stenosis measurements (when applicable) are obtained utilizing NASCET criteria, using the distal internal carotid diameter as the denominator. CONTRAST:  82mL ISOVUE-370 IOPAMIDOL (ISOVUE-370) INJECTION 76% COMPARISON:  None. FINDINGS: CTA NECK FINDINGS Aortic arch: Extensive atherosclerotic calcification. Three vessel branching Right carotid system: Diffuse mainly calcified atherosclerotic plaque which limits visualization of the lumen due to plaque blooming. There is distal common carotid stenosis that measures up to 70% (when compared to the more proximal vessel given the immediate downstream bifurcation). There is ICA bulb plaque with 70% stenosis. Left carotid system: Much less extensive atherosclerotic plaque, question  prior carotid endarterectomy. No stenosis or ulceration. Vertebral arteries: Severe bilateral proximal subclavian stenosis, with possible occlusion on the right. Narrowing on the left involves and extensive segment. No flow is seen within vertebral arteries until the V3 segments, there may be retrograde flow Skeleton: Diffuse degenerative disease.  No acute finding Other neck: Bilateral cataract resection. Upper chest: Hazy density of the lungs which could be from atelectasis. No Kerley lines to implicate edema. Review of the MIP images confirms the above findings CTA HEAD FINDINGS Anterior circulation: Confluent atherosclerotic calcification on the carotid siphons with small vessel size and plaque blooming limiting luminal visualization. There is a left M2 branch occlusion beginning at the M1 bifurcation with subsequent downstream reconstitution. No contralateral embolism is seen. Posterior circulation: Tiny vertebrobasilar arteries. There may be a basilar interruption from atherosclerosis or developmental variant. Fetal type flow to both posterior cerebral arteries which are symmetrically patent Venous sinuses: Patent as permitted by contrast timing Anatomic variants: As above Delayed phase: Not obtained in the emergent setting Review of the MIP images confirms the above findings Case discussed with Dr. Rory Percy at 5:04 p.m. IMPRESSION: 1. Left M2 branch occlusion beginning at the M1 bifurcation. 2. Severe atherosclerosis which preferentially spares the left carotid circulation, question prior endarterectomy. 3. Severe atheromatous narrowing if not occlusion of bilateral proximal subclavian arteries. There is bilateral fetal type PCA with diffuse thready flow in the posterior fossa. 4. 70% atheromatous narrowing of the right common carotid and ICA bulb. Electronically Signed   By: Monte Fantasia M.D.   On: 04/02/2018 17:16   Mr Jodene Nam Head Wo Contrast  Result Date: 03/09/2018 CLINICAL DATA:  Stroke follow-up. Status  post endovascular revascularization of left MCA occlusion yesterday. EXAM: MRI HEAD WITHOUT CONTRAST MRA HEAD WITHOUT CONTRAST TECHNIQUE: Multiplanar, multiecho pulse sequences of the brain and surrounding structures were obtained without intravenous contrast. Angiographic images of the head were obtained using MRA technique without contrast. COMPARISON:  Head CT and CTA 04/06/2018. Brain MRI 07/20/2016. Head MRA 09/30/2011. FINDINGS: MRI HEAD FINDINGS Brain: There is a small to slightly moderate-sized acute left MCA infarct involving the insula, predominantly posterior frontal lobe as well as operculum, and small amount of the postcentral gyrus. There also punctate acute infarcts involving the left caudate nucleus, right centrum semiovale, parasagittal right parietal cortex, and likely left occipital white matter. There are also small acute infarcts in the left greater than right cerebellar hemispheres. Susceptibility artifact in the right precentral gyrus corresponds to the 4 mm acute hemorrhage on yesterday's CT with minimal surrounding edema. There are numerous chronic microhemorrhages clustered about the right insula and operculum which are new from the 2018 MRI. Multiple additional chronic microhemorrhages are present in the basal ganglia bilaterally and scattered throughout both cerebral hemispheres and cerebellum. Patchy T2 hyperintensities in the cerebral white matter and pons have progressed from the prior MRI and are nonspecific but compatible with moderate chronic  small-vessel ischemic disease. There are new chronic lacunar infarcts in the cerebral white matter, and there are chronic lacunar infarcts in the basal ganglia bilaterally. Small chronic infarcts in the cerebellar hemispheres have increased in number from the prior MRI. Mild cerebral atrophy is within normal limits for age. No mass, midline shift, or extra-axial fluid collection is identified. Vascular: Diminutive vertebrobasilar circulation,  more fully evaluated below. Skull and upper cervical spine: Unremarkable bone marrow signal. Sinuses/Orbits: Bilateral cataract extraction. Paranasal sinuses and mastoid air cells are clear. Other: Partially visualized endotracheal and enteric tubes. MRA HEAD FINDINGS The distal vertebral arteries are diminutive with diminished flow most notable in the distal right V3 and V4 segments and with bilateral proximal vertebral artery occlusion shown on yesterday's neck CTA. Patent bilateral PICA, right AICA, and bilateral SCA origins are identified. There is a chronic lack of visualization of the mid basilar artery which appears patent more proximally and distally, and this may reflect segmental atherosclerotic occlusion or a developmental variant. There are large posterior communicating arteries bilaterally with absence or severe hypoplasia of the right P1 segment. Both PCAs are patent without evidence of significant proximal stenosis. The internal carotid arteries are patent from skull base to carotid termini with extensive bilateral cavernous segment atherosclerotic irregularity resulting in mild stenosis on the right and moderate stenosis on the left. 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 ACAs and MCAs are patent with branch vessel irregularity but no evidence of proximal branch occlusion. The left MCA trifurcation remains patent following thrombectomy although there is moderate stenosis of the inferior and mid M2 origins. There also mild proximal left M1 and left A1 stenoses. IMPRESSION: 1. Small to moderate-sized acute left MCA infarct. 2. Additional predominantly subcentimeter acute infarcts in the cerebral and cerebellar hemispheres bilaterally. 3. 4 mm acute hemorrhage in the right precentral gyrus as seen on CT yesterday. 4. Progressive chronic small vessel ischemia since 2018  with increased number of chronic microhemorrhages and chronic lacunar infarcts. 5. Advanced intracranial atherosclerosis. Revascularized left MCA bifurcation remains patent with moderate M2 origin stenoses. 6. Moderate ICA stenoses. 7. Diminished flow in the distal vertebral arteries with bilateral proximal occlusion shown on prior CTA. 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 8. 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 Electronically Signed   By: Logan Bores M.D.   On: 03/09/2018 17:17   Mr Brain Wo Contrast  Result Date: 03/09/2018 CLINICAL DATA:  Stroke follow-up. Status post endovascular revascularization of left MCA occlusion yesterday. EXAM: MRI HEAD WITHOUT CONTRAST MRA HEAD WITHOUT CONTRAST TECHNIQUE: Multiplanar, multiecho pulse sequences of the brain and surrounding structures were obtained without intravenous contrast. Angiographic images of the head were obtained using MRA technique without contrast. COMPARISON:  Head CT and CTA 03/11/2018. Brain MRI 07/20/2016. Head MRA 09/30/2011. FINDINGS: MRI HEAD FINDINGS Brain: There is a small to slightly moderate-sized acute left MCA infarct involving the insula, predominantly posterior frontal lobe as well as operculum, and small amount of the postcentral gyrus. There also punctate acute infarcts involving the left caudate nucleus, right centrum semiovale, parasagittal right parietal cortex, and likely left occipital white matter. There are also small acute infarcts in the left greater than right cerebellar hemispheres. Susceptibility artifact in the right precentral gyrus corresponds to the 4 mm acute  hemorrhage on yesterday's CT with minimal surrounding edema. There are numerous chronic microhemorrhages clustered about the right insula and operculum which are new from the 2018 MRI. Multiple additional chronic microhemorrhages are  present in the basal ganglia bilaterally and scattered throughout both cerebral hemispheres and cerebellum. Patchy T2 hyperintensities in the cerebral white matter and pons have progressed from the prior MRI and are nonspecific but compatible with moderate chronic small-vessel ischemic disease. There are new chronic lacunar infarcts in the cerebral white matter, and there are chronic lacunar infarcts in the basal ganglia bilaterally. Small chronic infarcts in the cerebellar hemispheres have increased in number from the prior MRI. Mild cerebral atrophy is within normal limits for age. No mass, midline shift, or extra-axial fluid collection is identified. Vascular: Diminutive vertebrobasilar circulation, more fully evaluated below. Skull and upper cervical spine: Unremarkable bone marrow signal. Sinuses/Orbits: Bilateral cataract extraction. Paranasal sinuses and mastoid air cells are clear. Other: Partially visualized endotracheal and enteric tubes. MRA HEAD FINDINGS The distal vertebral arteries are diminutive with diminished flow most notable in the distal right V3 and V4 segments and with bilateral proximal vertebral artery occlusion shown on yesterday's neck CTA. Patent bilateral PICA, right AICA, and bilateral SCA origins are identified. There is a chronic lack of visualization of the mid basilar artery which appears patent more proximally and distally, and this may reflect segmental atherosclerotic occlusion or a developmental variant. There are large posterior communicating arteries bilaterally with absence or severe hypoplasia of the right P1 segment. Both PCAs are patent without evidence of significant proximal stenosis. The internal carotid arteries are patent from skull base to carotid termini with extensive bilateral cavernous segment atherosclerotic irregularity resulting in mild stenosis on the right and moderate stenosis on the left. 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 ACAs and MCAs are patent with branch vessel irregularity but no evidence of proximal branch occlusion. The left MCA trifurcation remains patent following thrombectomy although there is moderate stenosis of the inferior and mid M2 origins. There also mild proximal left M1 and left A1 stenoses. IMPRESSION: 1. Small to moderate-sized acute left MCA infarct. 2. Additional predominantly subcentimeter acute infarcts in the cerebral and cerebellar hemispheres bilaterally. 3. 4 mm acute hemorrhage in the right precentral gyrus as seen on CT yesterday. 4. Progressive chronic small vessel ischemia since 2018 with increased number of chronic microhemorrhages and chronic lacunar infarcts. 5. Advanced intracranial atherosclerosis. Revascularized left MCA bifurcation remains patent with moderate M2 origin stenoses. 6. Moderate ICA stenoses. 7. Diminished flow in the distal vertebral arteries with bilateral proximal occlusion shown on prior CTA. 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 8. 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 Electronically Signed   By: Logan Bores M.D.   On: 03/09/2018 17:17   Ir Angiogram Extremity Bilateral  Result Date: 04/07/2018 INDICATION: 73 year old female with a history of acute left MCA stroke. She presents within time frame for IV tPA, and is a candidate for mechanical thrombectomy EXAM: ULTRASOUND GUIDED ACCESS RIGHT COMMON FEMORAL ARTERY ULTRASOUND GUIDED ACCESS LEFT COMMON FEMORAL ARTERY FOR ARTERIAL MONITORING ANGIOGRAM RIGHT LOWER EXTREMITY BALLOON ANGIOPLASTY STENOSIS RIGHT COMMON ILIAC ARTERY IN ORDER TO PASS 8 FRENCH SHEATH TO COMPLETE THE THROMBECTOMY CEREBRAL ANGIOGRAM MECHANICAL THROMBECTOMY LEFT MCA EMERGENT LARGE VESSEL OCCLUSION DEPLOYMENT OF  ANGIO-SEAL  RIGHT COMMON FEMORAL ARTERY COMPARISON:  CT imaging of the same day MEDICATIONS: 2 g Ancef IV. The antibiotic was administered within 1 hour of the procedure ANESTHESIA/SEDATION: General endotracheal tube anesthesia CONTRAST:  96 cc Isovue-300 FLUOROSCOPY TIME:  Fluoroscopy Time: 26 minutes 24 seconds (1,362 mGy). COMPLICATIONS: None TECHNIQUE: Informed written consent was obtained from the patient's family after a thorough discussion of the procedural risks, benefits and alternatives. Specific risks discussed include: Bleeding, infection, contrast reaction, kidney injury/failure, need for further procedure/surgery, arterial injury or dissection, embolization to new territory, intracranial hemorrhage (10-15% risk), neurologic deterioration, cardiopulmonary collapse, death. All questions were addressed. Maximal Sterile Barrier Technique was utilized including during the procedure including caps, mask, sterile gowns, sterile gloves, sterile drape, hand hygiene and skin antiseptic. A timeout was performed prior to the initiation of the procedure. The anesthesia team was present to provide general endotracheal tube anesthesia and for patient monitoring during the procedure. Interventional neuro radiology nursing staff was also present. 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 Surgical changes of prior endarterectomy evident on prior CT imaging. Left external carotid artery: Patent with antegrade flow. Left internal carotid artery: Normal course caliber and contour of the cervical portion. Vertical and petrous segment patent with normal course caliber contour. Cavernous segment patent. Clinoid segment patent. Antegrade flow of the ophthalmic artery. Ophthalmic segment patent. Terminus patent. Left MCA: Distal M1 occluded at the initiation of the case. There is partial filling of an inferior branch  which is the non dominant branch to the frontal. Patent fetal PCA to the left P2 segment. Left ACA: A 1 segment patent. A 2 segment perfuses the right territory. Completion Findings: Left MCA: After 2 passes of mechanical thrombectomy plus aspiration, there is near complete restoration of flow through the left MCA territory, with slow flow through an M3/M4 branch of the parietal region compatible with TI CI 2b flow. 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 Physical exam At the initiation of the case, palpable bilateral common femoral artery pulses present No palpable distal pulses Doppler positive pulses of the right posterior tibial and anterior tibial (with systolic blood pressures above 200) Doppler positive pulse at the left posterior tibial (with systolic blood pressure above 200), with no Doppler signal at the left anterior tibial At completion of case Doppler positive pulses were only discovered on the right posterior tibial and anterior tibial arteries with blood pressures above 200. In the 140-160 range (goal) Doppler signal was not evident A completion of case Doppler positive pulses were only discovered on the left at the posterior tibial and not the left anterior tibial (with the pressure above 440 systolic). PROCEDURE: Patient is brought emergently to the neuro angiography suite, with the patient identified appropriately and placed supine position on the table. Left radial arterial line was attempted by the anesthesia team. The patient is then prepped and draped in the usual sterile fashion. Ultrasound survey of the right inguinal region was performed with images stored and sent to PACs. 11 blade scalpel was used to make a small incision. Blunt dissection was performed. A micropuncture needle was used access the right common femoral artery under ultrasound. With excellent arterial blood flow returned, an .018 micro wire was passed through the needle, observed to  enter the abdominal aorta under fluoroscopy. The needle was removed, and a micropuncture sheath was placed over the wire. The inner dilator  and wire were removed, and an 035 Bentson wire was advanced under fluoroscopy into the abdominal aorta. The sheath was removed and a standard 5 [place] vascular sheath was placed. The dilator was removed and the sheath was flushed. Ultrasound survey of the left inguinal region was then performed with images stored and sent to PACs, confirming patency of the vessel. A micropuncture needle was used access the left common femoral artery under ultrasound. With excellent arterial blood flow returned, and an .018 micro wire was passed through the needle, observed enter the abdominal aorta under fluoroscopy. The needle was removed, and a micropuncture sheath was placed over the wire. The inner dilator and wire were removed, and an 035 Bentson wire was advanced under fluoroscopy into the abdominal aorta. The sheath was removed and a standard 4 [place] vascular sheath was placed for arterial monitoring. The dilator was removed and the sheath was flushed. A 20F JB-1 diagnostic catheter was then advanced over the wire through the existing right femoral access to the proximal descending thoracic aorta. Wire was then removed. Double flush of the catheter was performed. Catheter was then used to select the left cervical internal carotid artery. Formal angiogram was performed, with roadmap achieved. Exchange length Rosen wire was then passed through the diagnostic catheter to the distal cervical ICA and the diagnostic catheter was removed. The 5 French sheath was removed, with attempt at placing 8 French 55 cm bright tip sheath. Significant resistance was encountered with passing the sheath through the right iliac system. The sheath would not advanced through the pre-existing right common iliac artery stent. Sheath was withdrawn and rotated. We attempted to advance the sheath over the introducer  which was pinned on the wire. We also removed the introducer in place to diagnostic catheter into the aorta and attempted to pass the sheath over the diagnostic catheter. None of these maneuvers were successful. Sheath was then withdrawn into the external iliac artery. Balloon angioplasty was performed of the pre-existing right common iliac artery stent with 5 mm x 40 mm standard balloon angioplasty. Balloon was removed and the introducer was then replaced. Eight French sheath was then again attempted to pass over the wire, unsuccessful through this segment of the iliac artery. The introducer was removed, diagnostic catheter was placed into the cervical segment of the internal carotid artery, and the Rose an wire was exchanged for a 260 cm Amplatz wire. With the stiff wire in place, another attempt was made to pass the sheath which was unsuccessful. Sheath was then again withdrawn to the external iliac artery, introducer was removed, and a second balloon angioplasty 6 mm x 40 mm was performed at the pre-existing stent. The sheath was then successfully advanced over the balloon as the balloon was deflated, into the abdominal aorta. The introducer was again placed after removing the balloon and the sheath was advanced 6 successfully into the aorta. Introducer was withdrawn. Once the sheath was advanced over the wire to the thoracic aorta, sheath was flushed and attached to pressurized and heparinized saline bag for constant forward flow. Eight French 95 cm flow gate balloon catheter was then advanced over the wire to the distal cervical segment. Wire was removed. Then a coaxial intermediate catheter and microcatheter combination was prepared on the back table. This combination was penumbra Ace 64 catheter and a Trevo Provue18 microcatheter, with a synchro soft wire. This combination was then advanced through the balloon guide into the ICA. System was advanced into the internal carotid artery, to the  level of the  occlusion. The micro wire was then carefully advanced through the occluded segment, with a distal knuckle configuration. Microcatheter was then pushed through the occluded segment and the wire was removed. Intermediate catheter was advanced over the micro wire to the carotid siphon, which would not advance beyond the ophthalmic segment through the terminus. Blood was then aspirated through the hub of the microcatheter, and a gentle contrast injection was performed confirming intraluminal position. A rotating hemostatic valve was then attached to the back end of the microcatheter, and a pressurized and heparinized saline bag was attached to the catheter. 4 mm x 40 mm solitaire device was then selected. Back flush was achieved at the rotating hemostatic valve, and then the device was gently advanced through the microcatheter to the distal end. The retriever was then unsheathed by withdrawing the microcatheter under fluoroscopy. Once the retriever was completely unsheathed, the microcatheter was carefully stripped from the delivery wire of the device. Control angiogram was then performed from the balloon catheter. 3 minute time interval was observed. The balloon on the balloon guide was then inflated to profile of the vessel. Constant aspiration was then performed through the intermediate catheter, and constant gentle aspiration was performed at the balloon guide. This aspiration was continued as the retriever was gently and slowly withdrawn with fluoroscopic observation into the distal intermediate catheter. The entire system was then gently withdrawn from the intracranial ICA and into the balloon guide. Once the retriever was entirely removed from the system, free aspiration was confirmed at the hub of the balloon guide, with free blood return confirmed. Control angiogram was then performed. TICI 2a flow was achieved. Repeat angiogram with multiple angulation demonstrated persistent occlusion of parietal branch. The  same coaxial system was again passed through the balloon guide catheter. The microcatheter system was then advanced through the occlusion of the parietal branch. Once the micro wire microcatheter were beyond the occlusion, the micro wire was removed and stentreiver device was deployed, stripping the microcatheter from the wire. After the device was deployed across the occlusion, the intermediate catheter was again attempted to advanced to the M1 segment. This was unsuccessful, and would not advance beyond the ophthalmic segment. The balloon at the balloon guide was inflated to profile of the vessel. Local aspiration was performed at the intermediate catheter upon withdrawal of the device under fluoroscopic observation, with aspiration at the balloon guide. Once the retrieve her and intermediate catheter were entirely removed from the system, free aspiration was confirmed at the hub of the intermediate catheter, with free blood return confirmed. Control angiogram was again performed. Improved flow was confirmed, TICI 2b. Balloon guide was withdrawn and angiogram of the cervical segment was performed. We then removed the balloon guide catheter and deployed an 8 French Angio-Seal at the right common femoral artery access. Angiogram was performed of the right sided access and the left-sided access before completing the case. Patient tolerated the procedure well and remained hemodynamically stable throughout. No complications were encountered. Estimated blood loss approximately 50 cc. IMPRESSION: Status post ultrasound guided access right common femoral artery for cerebral angiogram and a combination of aspiration and mechanical thrombectomy of left M1 emergent large vessel occlusion, and restoration of TICI 2b flow after 2 passes. Status post balloon angioplasty to 6 mm of proximal right common iliac artery for treatment of a restrictive lesion in order to pass 8 French sheath to perform the thrombectomy. Status post  deployment of Angio-Seal at the right common femoral artery. At  the conclusion of the case, a 4 French sheath remains in left common femoral artery for arterial monitoring. Signed, Dulcy Fanny. Dellia Nims, RPVI [facility] Inter[facility] Radiology [facility] Radiology PLAN: Formal CT after the case Right hip straight overnight status post 8 French device and Angio-Seal closure Left common femoral artery for French sheath may be removed when hemodynamic monitoring is no longer needed with manual pressure. Goal blood pressure 440 [id number] systolic given the incomplete flow restoration Electronically Signed   By: Corrie Mckusick D.O.   On: 03/22/2018 20:39   Ir US Guide Vasc Access Right  Result Date: 03/13/2018 INDICATION: 73 year old female with a history of acute left MCA stroke. She presents within time frame for IV tPA, and is a candidate for mechanical thrombectomy EXAM: ULTRASOUND GUIDED ACCESS RIGHT COMMON FEMORAL ARTERY ULTRASOUND GUIDED ACCESS LEFT COMMON FEMORAL ARTERY FOR ARTERIAL MONITORING ANGIOGRAM RIGHT LOWER EXTREMITY BALLOON ANGIOPLASTY STENOSIS RIGHT COMMON ILIAC ARTERY IN ORDER TO PASS 8 FRENCH SHEATH TO COMPLETE THE THROMBECTOMY CEREBRAL ANGIOGRAM MECHANICAL THROMBECTOMY LEFT MCA EMERGENT LARGE VESSEL OCCLUSION DEPLOYMENT OF ANGIO-SEAL RIGHT COMMON FEMORAL ARTERY COMPARISON:  CT imaging of the same day MEDICATIONS: 2 g Ancef IV. The antibiotic was administered within 1 hour of the procedure ANESTHESIA/SEDATION: General endotracheal tube anesthesia CONTRAST:  96 cc Isovue-300 FLUOROSCOPY TIME:  Fluoroscopy Time: 26 minutes 24 seconds (1,362 mGy). COMPLICATIONS: None TECHNIQUE: Informed written consent was obtained from the patient's family after a thorough discussion of the procedural risks, benefits and alternatives. Specific risks discussed include: Bleeding, infection, contrast reaction, kidney injury/failure, need for further procedure/surgery, arterial injury or dissection,  embolization to new territory, intracranial hemorrhage (10-15% risk), neurologic deterioration, cardiopulmonary collapse, death. All questions were addressed. Maximal Sterile Barrier Technique was utilized including during the procedure including caps, mask, sterile gowns, sterile gloves, sterile drape, hand hygiene and skin antiseptic. A timeout was performed prior to the initiation of the procedure. The anesthesia team was present to provide general endotracheal tube anesthesia and for patient monitoring during the procedure. Interventional neuro radiology nursing staff was also present. 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 Surgical changes of prior endarterectomy evident on prior CT imaging. Left external carotid artery: Patent with antegrade flow. Left internal carotid artery: Normal course caliber and contour of the cervical portion. Vertical and petrous segment patent with normal course caliber contour. Cavernous segment patent. Clinoid segment patent. Antegrade flow of the ophthalmic artery. Ophthalmic segment patent. Terminus patent. Left MCA: Distal M1 occluded at the initiation of the case. There is partial filling of an inferior branch which is the non dominant branch to the frontal. Patent fetal PCA to the left P2 segment. Left ACA: A 1 segment patent. A 2 segment perfuses the right territory. Completion Findings: Left MCA: After 2 passes of mechanical thrombectomy plus aspiration, there is near complete restoration of flow through the left MCA territory, with slow flow through an M3/M4 branch of the parietal region compatible with TI CI 2b flow. 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 Physical exam At the initiation of the case, palpable bilateral common femoral artery pulses present No palpable distal pulses Doppler positive pulses of  the right posterior tibial and anterior tibial (with systolic blood pressures above 200) Doppler positive pulse at the left posterior tibial (with systolic blood pressure above 200), with no Doppler signal at the left anterior tibial At completion of case Doppler positive  pulses were only discovered on the right posterior tibial and anterior tibial arteries with blood pressures above 200. In the 140-160 range (goal) Doppler signal was not evident A completion of case Doppler positive pulses were only discovered on the left at the posterior tibial and not the left anterior tibial (with the pressure above 678 systolic). PROCEDURE: Patient is brought emergently to the neuro angiography suite, with the patient identified appropriately and placed supine position on the table. Left radial arterial line was attempted by the anesthesia team. The patient is then prepped and draped in the usual sterile fashion. Ultrasound survey of the right inguinal region was performed with images stored and sent to PACs. 11 blade scalpel was used to make a small incision. Blunt dissection was performed. A micropuncture needle was used access the right common femoral artery under ultrasound. With excellent arterial blood flow returned, an .018 micro wire was passed through the needle, observed to enter the abdominal aorta under fluoroscopy. The needle was removed, and a micropuncture sheath was placed over the wire. The inner dilator and wire were removed, and an 035 Bentson wire was advanced under fluoroscopy into the abdominal aorta. The sheath was removed and a standard 5 [place] vascular sheath was placed. The dilator was removed and the sheath was flushed. Ultrasound survey of the left inguinal region was then performed with images stored and sent to PACs, confirming patency of the vessel. A micropuncture needle was used access the left common femoral artery under ultrasound. With excellent arterial blood flow returned, and an .018  micro wire was passed through the needle, observed enter the abdominal aorta under fluoroscopy. The needle was removed, and a micropuncture sheath was placed over the wire. The inner dilator and wire were removed, and an 035 Bentson wire was advanced under fluoroscopy into the abdominal aorta. The sheath was removed and a standard 4 [place] vascular sheath was placed for arterial monitoring. The dilator was removed and the sheath was flushed. A 9F JB-1 diagnostic catheter was then advanced over the wire through the existing right femoral access to the proximal descending thoracic aorta. Wire was then removed. Double flush of the catheter was performed. Catheter was then used to select the left cervical internal carotid artery. Formal angiogram was performed, with roadmap achieved. Exchange length Rosen wire was then passed through the diagnostic catheter to the distal cervical ICA and the diagnostic catheter was removed. The 5 French sheath was removed, with attempt at placing 8 French 55 cm bright tip sheath. Significant resistance was encountered with passing the sheath through the right iliac system. The sheath would not advanced through the pre-existing right common iliac artery stent. Sheath was withdrawn and rotated. We attempted to advance the sheath over the introducer which was pinned on the wire. We also removed the introducer in place to diagnostic catheter into the aorta and attempted to pass the sheath over the diagnostic catheter. None of these maneuvers were successful. Sheath was then withdrawn into the external iliac artery. Balloon angioplasty was performed of the pre-existing right common iliac artery stent with 5 mm x 40 mm standard balloon angioplasty. Balloon was removed and the introducer was then replaced. Eight French sheath was then again attempted to pass over the wire, unsuccessful through this segment of the iliac artery. The introducer was removed, diagnostic catheter was placed into the  cervical segment of the internal carotid artery, and the Rose an wire was exchanged for a 260 cm Amplatz wire. With the stiff wire  in place, another attempt was made to pass the sheath which was unsuccessful. Sheath was then again withdrawn to the external iliac artery, introducer was removed, and a second balloon angioplasty 6 mm x 40 mm was performed at the pre-existing stent. The sheath was then successfully advanced over the balloon as the balloon was deflated, into the abdominal aorta. The introducer was again placed after removing the balloon and the sheath was advanced 6 successfully into the aorta. Introducer was withdrawn. Once the sheath was advanced over the wire to the thoracic aorta, sheath was flushed and attached to pressurized and heparinized saline bag for constant forward flow. Eight French 95 cm flow gate balloon catheter was then advanced over the wire to the distal cervical segment. Wire was removed. Then a coaxial intermediate catheter and microcatheter combination was prepared on the back table. This combination was penumbra Ace 64 catheter and a Trevo Provue18 microcatheter, with a synchro soft wire. This combination was then advanced through the balloon guide into the ICA. System was advanced into the internal carotid artery, to the level of the occlusion. The micro wire was then carefully advanced through the occluded segment, with a distal knuckle configuration. Microcatheter was then pushed through the occluded segment and the wire was removed. Intermediate catheter was advanced over the micro wire to the carotid siphon, which would not advance beyond the ophthalmic segment through the terminus. Blood was then aspirated through the hub of the microcatheter, and a gentle contrast injection was performed confirming intraluminal position. A rotating hemostatic valve was then attached to the back end of the microcatheter, and a pressurized and heparinized saline bag was attached to the  catheter. 4 mm x 40 mm solitaire device was then selected. Back flush was achieved at the rotating hemostatic valve, and then the device was gently advanced through the microcatheter to the distal end. The retriever was then unsheathed by withdrawing the microcatheter under fluoroscopy. Once the retriever was completely unsheathed, the microcatheter was carefully stripped from the delivery wire of the device. Control angiogram was then performed from the balloon catheter. 3 minute time interval was observed. The balloon on the balloon guide was then inflated to profile of the vessel. Constant aspiration was then performed through the intermediate catheter, and constant gentle aspiration was performed at the balloon guide. This aspiration was continued as the retriever was gently and slowly withdrawn with fluoroscopic observation into the distal intermediate catheter. The entire system was then gently withdrawn from the intracranial ICA and into the balloon guide. Once the retriever was entirely removed from the system, free aspiration was confirmed at the hub of the balloon guide, with free blood return confirmed. Control angiogram was then performed. TICI 2a flow was achieved. Repeat angiogram with multiple angulation demonstrated persistent occlusion of parietal branch. The same coaxial system was again passed through the balloon guide catheter. The microcatheter system was then advanced through the occlusion of the parietal branch. Once the micro wire microcatheter were beyond the occlusion, the micro wire was removed and stentreiver device was deployed, stripping the microcatheter from the wire. After the device was deployed across the occlusion, the intermediate catheter was again attempted to advanced to the M1 segment. This was unsuccessful, and would not advance beyond the ophthalmic segment. The balloon at the balloon guide was inflated to profile of the vessel. Local aspiration was performed at the  intermediate catheter upon withdrawal of the device under fluoroscopic observation, with aspiration at the balloon guide. Once the retrieve her  and intermediate catheter were entirely removed from the system, free aspiration was confirmed at the hub of the intermediate catheter, with free blood return confirmed. Control angiogram was again performed. Improved flow was confirmed, TICI 2b. Balloon guide was withdrawn and angiogram of the cervical segment was performed. We then removed the balloon guide catheter and deployed an 8 French Angio-Seal at the right common femoral artery access. Angiogram was performed of the right sided access and the left-sided access before completing the case. Patient tolerated the procedure well and remained hemodynamically stable throughout. No complications were encountered. Estimated blood loss approximately 50 cc. IMPRESSION: Status post ultrasound guided access right common femoral artery for cerebral angiogram and a combination of aspiration and mechanical thrombectomy of left M1 emergent large vessel occlusion, and restoration of TICI 2b flow after 2 passes. Status post balloon angioplasty to 6 mm of proximal right common iliac artery for treatment of a restrictive lesion in order to pass 8 French sheath to perform the thrombectomy. Status post deployment of Angio-Seal at the right common femoral artery. At the conclusion of the case, a 4 [place] sheath remains in left common femoral artery for arterial monitoring. Signed, Dulcy Fanny. Dellia Nims, RPVI Vascular and Interventional Radiology Specialists [facility] Radiology PLAN: Formal CT after the case Right hip straight overnight status post 8 French device and Angio-Seal closure Left common femoral artery for French sheath may be removed when hemodynamic monitoring is no longer needed with manual pressure. Goal blood pressure 342 [id number] systolic given the incomplete flow restoration Electronically Signed   By: Corrie Mckusick  D.O.   On: 03/23/2018 20:39   Dg Chest Port 1 View  Result Date: 03/10/2018 CLINICAL DATA:  Cerebral infarction and respiratory failure. EXAM: PORTABLE CHEST 1 VIEW COMPARISON:  03/27/2018 FINDINGS: Endotracheal tube remains present with the tip approximately 3 cm above the carina. Gastric decompression tube has been advanced and now extends further into the stomach. Stable significant cardiac enlargement. Lungs show some improved aeration at the right base with mild residual right basilar atelectasis remaining. Left lower lobe atelectasis is relatively stable. No pulmonary edema, significant pleural fluid or pneumothorax. IMPRESSION: Advancement of gastric decompression tube further into the stomach. Improved aeration at the right lung base with residual mild right basilar atelectasis. Stable left lower lobe atelectasis. Electronically Signed   By: Aletta Edouard M.D.   On: 03/10/2018 08:18   Portable Chest X-ray  Result Date: 04/04/2018 CLINICAL DATA:  Intubated EXAM: PORTABLE CHEST 1 VIEW COMPARISON:  06/01/2017 chest radiograph. FINDINGS: Endotracheal tube tip is 4.2 cm above the carina. Enteric tube terminates at the esophagogastric junction with the side port in the lower thoracic esophagus. Stable cardiomediastinal silhouette with moderate cardiomegaly. No pneumothorax. No pleural effusion. Hazy lower parahilar lung opacities, most prominent in the right lower lung, worsened. IMPRESSION: 1. Well-positioned endotracheal tube. 2. Enteric tube terminates at the esophagogastric junction with the side port in the lower thoracic esophagus, consider advancing 8-10 cm. 3. Moderate cardiomegaly. Worsening hazy lower parahilar lung opacities, most prominent in the right lower lung, favor pulmonary edema. Electronically Signed   By: Ilona Sorrel M.D.   On: 03/11/2018 22:12   Ir Percutaneous Art Thrombectomy/infusion Intracranial Inc Diag Angio  Result Date: 04/01/2018 INDICATION: 73 year old female with a  history of acute left MCA stroke. She presents within time frame for IV tPA, and is a candidate for mechanical thrombectomy EXAM: ULTRASOUND GUIDED ACCESS RIGHT COMMON FEMORAL ARTERY ULTRASOUND GUIDED ACCESS LEFT COMMON FEMORAL ARTERY FOR ARTERIAL MONITORING  ANGIOGRAM RIGHT LOWER EXTREMITY BALLOON ANGIOPLASTY STENOSIS RIGHT COMMON ILIAC ARTERY IN ORDER TO PASS 8 FRENCH SHEATH TO COMPLETE THE THROMBECTOMY CEREBRAL ANGIOGRAM MECHANICAL THROMBECTOMY LEFT MCA EMERGENT LARGE VESSEL OCCLUSION DEPLOYMENT OF ANGIO-SEAL RIGHT COMMON FEMORAL ARTERY COMPARISON:  CT imaging of the same day MEDICATIONS: 2 g Ancef IV. The antibiotic was administered within 1 hour of the procedure ANESTHESIA/SEDATION: General endotracheal tube anesthesia CONTRAST:  96 cc Isovue-300 FLUOROSCOPY TIME:  Fluoroscopy Time: 26 minutes 24 seconds (1,362 mGy). COMPLICATIONS: None TECHNIQUE: Informed written consent was obtained from the patient's family after a thorough discussion of the procedural risks, benefits and alternatives. Specific risks discussed include: Bleeding, infection, contrast reaction, kidney injury/failure, need for further procedure/surgery, arterial injury or dissection, embolization to new territory, intracranial hemorrhage (10-15% risk), neurologic deterioration, cardiopulmonary collapse, death. All questions were addressed. Maximal Sterile Barrier Technique was utilized including during the procedure including caps, mask, sterile gowns, sterile gloves, sterile drape, hand hygiene and skin antiseptic. A timeout was performed prior to the initiation of the procedure. The anesthesia team was present to provide general endotracheal tube anesthesia and for patient monitoring during the procedure. Interventional neuro radiology nursing staff was also present. 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 Surgical changes  of prior endarterectomy evident on prior CT imaging. Left external carotid artery: Patent with antegrade flow. Left internal carotid artery: Normal course caliber and contour of the cervical portion. Vertical and petrous segment patent with normal course caliber contour. Cavernous segment patent. Clinoid segment patent. Antegrade flow of the ophthalmic artery. Ophthalmic segment patent. Terminus patent. Left MCA: Distal M1 occluded at the initiation of the case. There is partial filling of an inferior branch which is the non dominant branch to the frontal. Patent fetal PCA to the left P2 segment. Left ACA: A 1 segment patent. A 2 segment perfuses the right territory. Completion Findings: Left MCA: After 2 passes of mechanical thrombectomy plus aspiration, there is near complete restoration of flow through the left MCA territory, with slow flow through an M3/M4 branch of the parietal region compatible with TI CI 2b flow. 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 Physical exam At the initiation of the case, palpable bilateral common femoral artery pulses present No palpable distal pulses Doppler positive pulses of the right posterior tibial and anterior tibial (with systolic blood pressures above 200) Doppler positive pulse at the left posterior tibial (with systolic blood pressure above 200), with no Doppler signal at the left anterior tibial At completion of case Doppler positive pulses were only discovered on the right posterior tibial and anterior tibial arteries with blood pressures above 200. In the 140-160 range (goal) Doppler signal was not evident A completion of case Doppler positive pulses were only discovered on the left at the posterior tibial and not the left anterior tibial (with the pressure above 151 systolic). PROCEDURE: Patient is brought emergently to the neuro angiography suite, with the patient identified appropriately and placed supine position on the  table. Left radial arterial line was attempted by the anesthesia team. The patient is then prepped and draped in the usual sterile fashion. Ultrasound survey of the right inguinal region was performed with images stored and sent to PACs. 11 blade scalpel was used to make a small incision. Blunt dissection was performed. A micropuncture needle was used access the right common femoral artery under ultrasound. With excellent arterial blood flow returned,  an .018 micro wire was passed through the needle, observed to enter the abdominal aorta under fluoroscopy. The needle was removed, and a micropuncture sheath was placed over the wire. The inner dilator and wire were removed, and an 035 Bentson wire was advanced under fluoroscopy into the abdominal aorta. The sheath was removed and a standard 5 [place] vascular sheath was placed. The dilator was removed and the sheath was flushed. Ultrasound survey of the left inguinal region was then performed with images stored and sent to PACs, confirming patency of the vessel. A micropuncture needle was used access the left common femoral artery under ultrasound. With excellent arterial blood flow returned, and an .018 micro wire was passed through the needle, observed enter the abdominal aorta under fluoroscopy. The needle was removed, and a micropuncture sheath was placed over the wire. The inner dilator and wire were removed, and an 035 Bentson wire was advanced under fluoroscopy into the abdominal aorta. The sheath was removed and a standard 4 [place] vascular sheath was placed for arterial monitoring. The dilator was removed and the sheath was flushed. A 31F JB-1 diagnostic catheter was then advanced over the wire through the existing right femoral access to the proximal descending thoracic aorta. Wire was then removed. Double flush of the catheter was performed. Catheter was then used to select the left cervical internal carotid artery. Formal angiogram was performed, with  roadmap achieved. Exchange length Rosen wire was then passed through the diagnostic catheter to the distal cervical ICA and the diagnostic catheter was removed. The 5 French sheath was removed, with attempt at placing 8 French 55 cm bright tip sheath. Significant resistance was encountered with passing the sheath through the right iliac system. The sheath would not advanced through the pre-existing right common iliac artery stent. Sheath was withdrawn and rotated. We attempted to advance the sheath over the introducer which was pinned on the wire. We also removed the introducer in place to diagnostic catheter into the aorta and attempted to pass the sheath over the diagnostic catheter. None of these maneuvers were successful. Sheath was then withdrawn into the external iliac artery. Balloon angioplasty was performed of the pre-existing right common iliac artery stent with 5 mm x 40 mm standard balloon angioplasty. Balloon was removed and the introducer was then replaced. Eight French sheath was then again attempted to pass over the wire, unsuccessful through this segment of the iliac artery. The introducer was removed, diagnostic catheter was placed into the cervical segment of the internal carotid artery, and the Rose an wire was exchanged for a 260 cm Amplatz wire. With the stiff wire in place, another attempt was made to pass the sheath which was unsuccessful. Sheath was then again withdrawn to the external iliac artery, introducer was removed, and a second balloon angioplasty 6 mm x 40 mm was performed at the pre-existing stent. The sheath was then successfully advanced over the balloon as the balloon was deflated, into the abdominal aorta. The introducer was again placed after removing the balloon and the sheath was advanced 6 successfully into the aorta. Introducer was withdrawn. Once the sheath was advanced over the wire to the thoracic aorta, sheath was flushed and attached to pressurized and heparinized  saline bag for constant forward flow. Eight French 95 cm flow gate balloon catheter was then advanced over the wire to the distal cervical segment. Wire was removed. Then a coaxial intermediate catheter and microcatheter combination was prepared on the back table. This combination was penumbra Ace  64 catheter and a Trevo Provue18 microcatheter, with a synchro soft wire. This combination was then advanced through the balloon guide into the ICA. System was advanced into the internal carotid artery, to the level of the occlusion. The micro wire was then carefully advanced through the occluded segment, with a distal knuckle configuration. Microcatheter was then pushed through the occluded segment and the wire was removed. Intermediate catheter was advanced over the micro wire to the carotid siphon, which would not advance beyond the ophthalmic segment through the terminus. Blood was then aspirated through the hub of the microcatheter, and a gentle contrast injection was performed confirming intraluminal position. A rotating hemostatic valve was then attached to the back end of the microcatheter, and a pressurized and heparinized saline bag was attached to the catheter. 4 mm x 40 mm solitaire device was then selected. Back flush was achieved at the rotating hemostatic valve, and then the device was gently advanced through the microcatheter to the distal end. The retriever was then unsheathed by withdrawing the microcatheter under fluoroscopy. Once the retriever was completely unsheathed, the microcatheter was carefully stripped from the delivery wire of the device. Control angiogram was then performed from the balloon catheter. 3 minute time interval was observed. The balloon on the balloon guide was then inflated to profile of the vessel. Constant aspiration was then performed through the intermediate catheter, and constant gentle aspiration was performed at the balloon guide. This aspiration was continued as the  retriever was gently and slowly withdrawn with fluoroscopic observation into the distal intermediate catheter. The entire system was then gently withdrawn from the intracranial ICA and into the balloon guide. Once the retriever was entirely removed from the system, free aspiration was confirmed at the hub of the balloon guide, with free blood return confirmed. Control angiogram was then performed. TICI 2a flow was achieved. Repeat angiogram with multiple angulation demonstrated persistent occlusion of parietal branch. The same coaxial system was again passed through the balloon guide catheter. The microcatheter system was then advanced through the occlusion of the parietal branch. Once the micro wire microcatheter were beyond the occlusion, the micro wire was removed and stentreiver device was deployed, stripping the microcatheter from the wire. After the device was deployed across the occlusion, the intermediate catheter was again attempted to advanced to the M1 segment. This was unsuccessful, and would not advance beyond the ophthalmic segment. The balloon at the balloon guide was inflated to profile of the vessel. Local aspiration was performed at the intermediate catheter upon withdrawal of the device under fluoroscopic observation, with aspiration at the balloon guide. Once the retrieve her and intermediate catheter were entirely removed from the system, free aspiration was confirmed at the hub of the intermediate catheter, with free blood return confirmed. Control angiogram was again performed. Improved flow was confirmed, TICI 2b. Balloon guide was withdrawn and angiogram of the cervical segment was performed. We then removed the balloon guide catheter and deployed an 8 French Angio-Seal at the right common femoral artery access. Angiogram was performed of the right sided access and the left-sided access before completing the case. Patient tolerated the procedure well and remained hemodynamically stable  throughout. No complications were encountered. Estimated blood loss approximately 50 cc. IMPRESSION: Status post ultrasound guided access right common femoral artery for cerebral angiogram and a combination of aspiration and mechanical thrombectomy of left M1 emergent large vessel occlusion, and restoration of TICI 2b flow after 2 passes. Status post balloon angioplasty to 6 mm of proximal  right common iliac artery for treatment of a restrictive lesion in order to pass 8 French sheath to perform the thrombectomy. Status post deployment of Angio-Seal at the right common femoral artery. At the conclusion of the case, a 4 [place] sheath remains in left common femoral artery for arterial monitoring. Signed, Dulcy Fanny. Dellia Nims, RPVI [facility] Radiology PLAN: Formal CT after the case Right hip straight overnight status post 8 French device and Angio-Seal closure Left common femoral artery for French sheath may be removed when hemodynamic monitoring is no longer needed with manual pressure. Goal blood pressure 161 [id number] systolic given the incomplete flow restoration Electronically Signed   By: Corrie Mckusick D.O.   On: 03/31/2018 20:39   Ct Head Code Stroke Wo Contrast  Result Date: 03/28/2018 CLINICAL DATA:  Code stroke.  Deficit not specified EXAM: CT HEAD WITHOUT CONTRAST TECHNIQUE: Contiguous axial images were obtained from the base of the skull through the vertex without intravenous contrast. COMPARISON:  Head CT 07/18/2016 and brain MRI 07/20/2016 FINDINGS: Brain: Multiple remote lacunar infarcts in the deep white matter and deep gray nuclei, chronic when compared to prior head CT and brain MRI. Small remote left cerebellar infarct. No evidence of acute infarct. No hemorrhage or hydrocephalus. Vascular: No hyperdense vessel. Skull: Normal. Negative for fracture or focal lesion. Sinuses/Orbits: No acute finding. Other: These results were communicated to Dr.  Rory Percy at 4:43 pmon 01/24/2020by text page via the [facility] messaging system. ASPECTS [facility] Stroke Program Early CT Score) Not scored without localizing symptoms. Motion artifact at the vertex blurring cortex. IMPRESSION: 1. No acute finding. 2. Motion artifact at the vertex. 3. Multiple remote small vessel infarcts. Electronically Signed   By: Monte Fantasia M.D.   On: 03/16/2018 16:45   Vas [place] Lower Extremity Venous (dvt)  Result Date: 03/09/2018  Lower Venous Study Indications: Stroke.  Limitations: Body habitus. Performing Technologist: Yvonne Patel MHA, RDMS, RVT, RDCS  Examination Guidelines: A complete evaluation includes B-mode imaging, spectral Doppler, color Doppler, and power Doppler as needed of all accessible portions of each vessel. Bilateral testing is considered an integral part of a complete examination. Limited examinations for reoccurring indications may be performed as noted.  Right Venous Findings: +---------+---------------+---------+-----------+----------+--------------+          CompressibilityPhasicitySpontaneityPropertiesSummary        +---------+---------------+---------+-----------+----------+--------------+ CFV                                                   Not visualized +---------+---------------+---------+-----------+----------+--------------+ SFJ                                                   Not visualized +---------+---------------+---------+-----------+----------+--------------+ FV Prox  Full                                                        +---------+---------------+---------+-----------+----------+--------------+ FV Mid   Full                                                        +---------+---------------+---------+-----------+----------+--------------+  FV DistalFull                                                        +---------+---------------+---------+-----------+----------+--------------+ PFV      Full                                                         +---------+---------------+---------+-----------+----------+--------------+ POP      Full           Yes      Yes                                 +---------+---------------+---------+-----------+----------+--------------+ PTV      Full                                                        +---------+---------------+---------+-----------+----------+--------------+ PERO                                                  Not visualized +---------+---------------+---------+-----------+----------+--------------+  Left Venous Findings: +---------+---------------+---------+-----------+----------+-------+          CompressibilityPhasicitySpontaneityPropertiesSummary +---------+---------------+---------+-----------+----------+-------+ CFV      Full           Yes      Yes                          +---------+---------------+---------+-----------+----------+-------+ SFJ      Full                                                 +---------+---------------+---------+-----------+----------+-------+ FV Prox  Full                                                 +---------+---------------+---------+-----------+----------+-------+ FV Mid   Full                                                 +---------+---------------+---------+-----------+----------+-------+ FV DistalFull                                                 +---------+---------------+---------+-----------+----------+-------+ PFV      Full                                                 +---------+---------------+---------+-----------+----------+-------+  POP      Full           Yes      Yes                          +---------+---------------+---------+-----------+----------+-------+ PTV      Full                                                 +---------+---------------+---------+-----------+----------+-------+ PERO     Full                                                  +---------+---------------+---------+-----------+----------+-------+    Summary: Right: There is no evidence of deep vein thrombosis in the lower extremity. However, portions of this examination were limited- see technologist comments above. No cystic structure found in the popliteal fossa. Left: There is no evidence of deep vein thrombosis in the lower extremity. No cystic structure found in the popliteal fossa.  *See table(s) above for measurements and observations. Electronically signed by Curt Jews MD on 03/09/2018 at 5:40:37 PM.    Final     PHYSICAL EXAM  Temp:  [98.3 F (36.8 C)-100.8 F (38.2 C)] 99.1 F (37.3 C) (01/03 0900) Pulse Rate:  [61-110] 61 (01/03 1000) Resp:  [16-26] 16 (01/03 1000) BP: (157-161)/(50-53) 161/53 (01/03 0805) SpO2:  [94 %-100 %] 100 % (01/03 1000) Arterial Line BP: (119-176)/(39-63) 121/39 (01/03 1000) FiO2 (%):  [30 %-40 %] 30 % (01/03 0810)  General - Well nourished, well developed, intubated on sedation.  Ophthalmologic - fundi not visualized due to noncooperation.  Cardiovascular - Regular rate and rhythm.  Neuro - intubated on sedation, with briefly off sedation, she is able to open eyes on voice stimulation but not following commands.  Eyes mid position, no deviation, however no doll's eyes.  Not blinking to visual threat bilaterally.  Pupil 2 mm bilaterally, reactive to light.  Left corneal present, right corneal weak reflexes.  Positive gag and cough.  Facial symmetry not able to test due to ET tube.  Left upper and lower extremity 2/5 on pain stimulation.  Right upper and lower extremity trace withdrawal to pain stimulaiton.  DTR 1+, Babinski negative. Sensation, coordination and gait not tested.   ASSESSMENT/PLAN Yvonne Patel is a 73 y.o. female with history of stroke, hypertension, PVD, diabetes, hyperlipidemia, status post right CEA, CHF admitted for right-sided weakness and aphasia. TPA given.     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  However, cardioembolic source cannot be excluded.  Resultant intubated on sedation, right hemiparesis  CT showed multiple old lacunar infarcts  CTA head and neck left M1/M2 occlusion.  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  Right precentral gyrus petechial hemorrhage.  MRA left MCA patent with moderate M2 origin stenosis  CT head repeat in a.m. to follow-up with right precentral gyrus  petechial hemorrhage given on DAPT  2D Echo EF 30 to 35% down from 40 to 45% in 05/2017  LE venous Doppler no DVT  LDL 92  HgbA1c 7.1  SCDs for VTE prophylaxis  aspirin 81 mg daily and clopidogrel 75 mg daily prior to admission, now on aspirin 81 and Plavix due to non-STEMI.   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 Einar Gip as outpatient  If patient has reasonable neurological functional outcome, may consider outpatient follow-up with vascular surgery for subclavian artery stenting  Non-STEMI and cardiomyopathy  Troponin 4.62-6.47-5.08-4.7-3.8-3.88  EF 30 to 35% down from 40 to 45% in 05/2017  Cardiology on board  No intervention recommended  Started on aspirin and Plavix  CT head repeat in a.m. to follow-up with right precentral gyrus  petechial hemorrhage given on DAPT  Tapered off nitroglycerin IV  AKI on CKD stage III  creatinine 2.20-2.25->2.97  CCM on board  BMP monitoring  continue IV fluid   Diabetes  HgbA1c 7.1 goal < 7.0  Uncontrolled  CBG monitoring  SSI  Hypertension . Stable . Off Cleviprex  BP goal 120-160 now  BP monitoring by left femoral A-line.  Patient BP monitoring difficult in arms given bilateral severe subclavian artery stenosis and difficult to get from legs due to atherosclerosis  Hyperlipidemia  Home meds: Lipitor 80  LDL 92, goal < 70  Now on Lipitor 80  Continue statin at discharge  Other Stroke Risk Factors  Advanced age  Former cigarette smoker, quit smoking 43 years ago  Limit ETOH use  Hx stroke/TIA -09/2011 left pontine infarct, LDL 115 and A1c 8.3.  Put on DAPT and Lipitor 80  Other Active Problems  Hypokalemia-supplement  Hyperglycemia   Hospital day # 2  This patient is critically ill due to left MCA stroke, severe multivessel atherosclerosis and stenosis, CHF, non-STEMI, hyperglycemia and at significant risk of neurological worsening, death form recurrent stroke, hemorrhagic conversion, heart failure, seizure, DKA. This patient's care requires constant monitoring of vital signs, hemodynamics, respiratory and cardiac monitoring, review of multiple databases, neurological assessment, discussion with family, other specialists and medical decision making of high complexity. I spent 40 minutes of neurocritical care time in the care of this patient. I had long discussion with 2 daughters at bedside, updated pt current condition, treatment plan and potential prognosis. They expressed understanding and appreciation.  I also had discussed with CCM Dr. Lamonte Sakai.   Rosalin Hawking, MD PhD Stroke Neurology 03/10/2018 11:05 AM    To contact Stroke Continuity provider, please refer to [email or web address] After hours, contact General Neurology

## 2018-03-11 ENCOUNTER — Inpatient Hospital Stay (HOSPITAL_COMMUNITY): Payer: PPO

## 2018-03-11 DIAGNOSIS — I5023 Acute on chronic systolic (congestive) heart failure: Secondary | ICD-10-CM

## 2018-03-11 DIAGNOSIS — I63312 Cerebral infarction due to thrombosis of left middle cerebral artery: Secondary | ICD-10-CM

## 2018-03-11 DIAGNOSIS — J9601 Acute respiratory failure with hypoxia: Secondary | ICD-10-CM

## 2018-03-11 DIAGNOSIS — Z9911 Dependence on respirator [ventilator] status: Secondary | ICD-10-CM

## 2018-03-11 LAB — BASIC METABOLIC PANEL
Anion gap: 15 (ref 5–15)
BUN: 56 mg/dL — ABNORMAL HIGH (ref 8–23)
CO2: 13 mmol/L — ABNORMAL LOW (ref 22–32)
Calcium: 7.6 mg/dL — ABNORMAL LOW (ref 8.9–10.3)
Chloride: 110 mmol/L (ref 98–111)
Creatinine, Ser: 4.68 mg/dL — ABNORMAL HIGH (ref 0.44–1.00)
GFR calc Af Amer: 10 mL/min — ABNORMAL LOW (ref >60)
GFR calc non Af Amer: 9 mL/min — ABNORMAL LOW (ref >60)
Glucose, Bld: 188 mg/dL — ABNORMAL HIGH (ref 70–99)
Potassium: 4.7 mmol/L (ref 3.5–5.1)
Sodium: 138 mmol/L (ref 135–145)

## 2018-03-11 LAB — GLUCOSE, CAPILLARY
GLUCOSE-CAPILLARY: 199 mg/dL — AB (ref 70–99)
Glucose-Capillary: 145 mg/dL — ABNORMAL HIGH (ref 70–99)
Glucose-Capillary: 157 mg/dL — ABNORMAL HIGH (ref 70–99)
Glucose-Capillary: 159 mg/dL — ABNORMAL HIGH (ref 70–99)
Glucose-Capillary: 182 mg/dL — ABNORMAL HIGH (ref 70–99)
Glucose-Capillary: 242 mg/dL — ABNORMAL HIGH (ref 70–99)

## 2018-03-11 LAB — CBC
HCT: 28.9 % — ABNORMAL LOW (ref 36.0–46.0)
HEMOGLOBIN: 9.3 g/dL — AB (ref 12.0–15.0)
MCH: 30.7 pg (ref 26.0–34.0)
MCHC: 32.2 g/dL (ref 30.0–36.0)
MCV: 95.4 fL (ref 80.0–100.0)
PLATELETS: 116 10*3/uL — AB (ref 150–400)
RBC: 3.03 MIL/uL — ABNORMAL LOW (ref 3.87–5.11)
RDW: 13.4 % (ref 11.5–15.5)
WBC: 12.1 10*3/uL — ABNORMAL HIGH (ref 4.0–10.5)
nRBC: 0.2 % (ref 0.0–0.2)

## 2018-03-11 LAB — MAGNESIUM
MAGNESIUM: 2.3 mg/dL (ref 1.7–2.4)
Magnesium: 2.2 mg/dL (ref 1.7–2.4)
Magnesium: 2.2 mg/dL (ref 1.7–2.4)

## 2018-03-11 LAB — PHOSPHORUS
PHOSPHORUS: 9.1 mg/dL — AB (ref 2.5–4.6)
Phosphorus: 8.2 mg/dL — ABNORMAL HIGH (ref 2.5–4.6)
Phosphorus: 8.1 mg/dL — ABNORMAL HIGH (ref 2.5–4.6)

## 2018-03-11 MED ORDER — CARVEDILOL 3.125 MG PO TABS
6.2500 mg | ORAL_TABLET | Freq: Two times a day (BID) | ORAL | Status: DC
  Administered 2018-03-12: 6.25 mg
  Filled 2018-03-11: qty 2

## 2018-03-11 MED ORDER — PRO-STAT SUGAR FREE PO LIQD
30.0000 mL | Freq: Two times a day (BID) | ORAL | Status: DC
  Administered 2018-03-11: 30 mL
  Filled 2018-03-11: qty 30

## 2018-03-11 MED ORDER — VITAL HIGH PROTEIN PO LIQD
1000.0000 mL | ORAL | Status: DC
  Administered 2018-03-12 – 2018-03-14 (×3): 1000 mL
  Filled 2018-03-11 (×3): qty 1000

## 2018-03-11 MED ORDER — VITAL HIGH PROTEIN PO LIQD
1000.0000 mL | ORAL | Status: DC
  Administered 2018-03-11: 1000 mL

## 2018-03-11 MED ORDER — HYDRALAZINE HCL 20 MG/ML IJ SOLN
10.0000 mg | Freq: Four times a day (QID) | INTRAMUSCULAR | Status: DC | PRN
  Administered 2018-03-14 (×2): 10 mg via INTRAVENOUS
  Filled 2018-03-11 (×3): qty 1

## 2018-03-11 MED ORDER — LABETALOL HCL 5 MG/ML IV SOLN: 20.0000 mg | INTRAVENOUS | Status: DC | PRN

## 2018-03-11 MED ORDER — CARVEDILOL 3.125 MG PO TABS
3.1250 mg | ORAL_TABLET | Freq: Two times a day (BID) | ORAL | Status: DC
  Administered 2018-03-11: 3.125 mg
  Filled 2018-03-11: qty 1

## 2018-03-11 MED ORDER — PRO-STAT SUGAR FREE PO LIQD
30.0000 mL | Freq: Every day | ORAL | Status: DC
  Administered 2018-03-12 – 2018-03-14 (×3): 30 mL
  Filled 2018-03-11 (×3): qty 30

## 2018-03-11 MED ORDER — VITAL HIGH PROTEIN PO LIQD: 1000.0000 mL | ORAL | Status: DC

## 2018-03-11 MED ORDER — CARVEDILOL 3.125 MG PO TABS: 3.1250 mg | ORAL_TABLET | Freq: Two times a day (BID) | ORAL | Status: DC

## 2018-03-11 NOTE — Progress Notes (Signed)
NAME:  Yvonne Patel, MRN:  [id number], DOB:  08-07-1945, LOS: 3 ADMISSION DATE:  03/14/2018, CONSULTATION DATE:  1/1 REFERRING MD:  Dr. Rory Percy , CHIEF COMPLAINT:  CVA   Brief History   73 year old female admitted with acute L MCA CVA s/p systemic TPA and mechanical thrombolysis.   Past Medical History  DM II TIA  Jehovah's witness > no blood products  HTN HLD CAD Cardiomyopathy  Significant Hospital Events   1/01 admit 1/03 CP, + troponin, Cardiology consulted.  Cleviprex, propofol.    Consults:  Cardiology  Procedures:  VIR 1/1 >> procedure completed for left MCA occlusion at M1 segment. The procedure involved two passes of combination mechanical and aspiration thrombectomy with restoration of TICI 2b flow. L femoral art line 1/1 >> ETT 1/1 >>   Significant Diagnostic Tests:  CT head 1/1 > no acute finding, multiple remote small vessel infarcts CTA head/neck 1/1 > Left M2 branch occlusion beginning at the M1 bifurcation. Severe atherosclerosis which preferentially spares the left carotid circulation, question prior endarterectomy. Severe atheromatous narrowing if not occlusion of bilateral proximal subclavian arteries. There is bilateral fetal type PCA with diffuse thready flow in the posterior fossa. 70% atheromatous narrowing of the right common carotid and ICA bulb. CT head 1/1 (post IR) > New 3 mm high-density within the posterior right frontal cortex which could be focus of hemorrhage or an enhancing subacute infarct. No hemorrhage or infarct seen along the postoperative left MCA distribution. TTE 1/1 >> LVEF 30-35%, mod global & severe inferior / inferolateral hypokinesis,  MRI Brain 1/2 >> moderate left MCA infarct, punctate acute infarcts involving the left caudate, right centrum semiovale, parasagittal right parietal cortex, bilateral cerebellar hemispheres.  4 mm hemorrhage right precentral gyrus.  Numerous microhemorrhages present basal ganglia both cerebral hemispheres,  cerebellum. Advanced intracranial atherosclerosis, moderate ICA stenoses, chronic segmental occlusion basilar artery with decreased bilateral vertebral artery flow  Micro Data:  1/1 MRSA PCR  >> neg  Antimicrobials:  1/1 ancef preop  Interim history/subjective:  Family at bedside.  RN reports BP remains elevated.    Objective   Blood pressure (!) 159/55, pulse 70, temperature 99.1 F (37.3 C), temperature source Axillary, resp. rate 19, weight 77.7 kg, SpO2 100 %.    Vent Mode: PRVC FiO2 (%):  [30 %] 30 % Set Rate:  [16 bmp] 16 bmp Vt Set:  [450 mL] 450 mL PEEP:  [5 cmH20] 5 cmH20 Plateau Pressure:  [18 cmH20-21 cmH20] 18 cmH20   Intake/Output Summary (Last 24 hours) at 03/11/2018 0823 Last data filed at 03/11/2018 0700 Gross per 24 hour  Intake 2004.76 ml  Output 125 ml  Net 1879.76 ml   Filed Weights   03/14/2018 1600  Weight: 77.7 kg    Examination: General: elderly female lying in bed on vent in NAD HEENT: MM pink/moist, ETT  Neuro: eyes open, spontaneous movement of LUE, LLE.  No follow commands, flaccid on R CV: s1s2 rrr, no m/r/g PULM: even/non-labored, lungs bilaterally clear  BL:TJQZ, non-tender, bsx4 active  Extremities: warm/dry, trace to 1+ edema RUE Skin: no rashes or lesions  Resolved Hospital Problem list     Assessment & Plan:   Acute CVA:  Left M1/M2 distribution. S/p systemic TPA 1/1 at 1700, and Neuro IR thrombectomy.  Associated hemorrhage. P: SBP Goal 120-160 per Stroke Service  Minimize sedating medications as able  IR following   Acute respiratory insufficency related to above  P: SBT as tolerated  PRVC 8cc/kg  as rest mode  Follow intermittent CXR   Hypertension PAD -difficult to monitor BP due to severe subclavian stenosis P: Monitor BP via ALine  Restart home coreg 3.125 BID  BP goal as above   Chest pain NSTEMI - troponin  peaked Systolic HF - TTE 1/1 with EF 30-35% P: Cardiology following, appreciate input.  No plan for  further work up at this time.  ASA, lipitor  BP goals as above   Acute on chronic renal failure, suspect some component of hypoperfusion, dye load P: Hold further diuresis (last dose 1/2) Trend BMP / urinary output Replace electrolytes as indicated Avoid nephrotoxic agents, ensure adequate renal perfusion Gentle IVF, NS at 51m/hr   DM2 P: SSI, sensitive scale    Best practice:  Diet: NPO Pain/Anxiety/Delirium protocol (if indicated): propofol RASS goal 0 to -1 VAP protocol (if indicated): per protocol DVT prophylaxis: SCD GI prophylaxis: protonix Glucose control: ssi Mobility: BR Code Status: Full Family Communication: Daughter's, son updated at bedside 1/4 at length.  Disposition: ICU  Labs   CBC: Recent Labs  Lab 03/10/2018 1642 04/07/2018 1656 03/09/18 1040 03/10/18 0503 03/11/18 0535  WBC  --  6.3 6.6 8.4 12.1*  NEUTROABS  --  3.2  --   --   --   HGB 13.9 12.2 9.5* 9.0* 9.3*  HCT 41.0 38.5 30.1* 28.8* 28.9*  MCV  --  94.1 95.3 96.0 95.4  PLT  --  174 150 145* 116*    Basic Metabolic Panel: Recent Labs  Lab 04/06/2018 1656 03/09/18 1040 03/09/18 1055 03/10/18 0503 03/10/18 1725 03/11/18 0535  NA 140  --  139 141 140 138  K 3.3*  --  3.6 4.1 5.1 4.7  CL 101  --  107 110 113* 110  CO2 25  --  21* 20* 15* 13*  GLUCOSE 191*  --  174* 171* 155* 188*  BUN 32*  --  25* 32* 42* 56*  CREATININE 2.25*  --  2.06* 2.97* 4.27* 4.68*  CALCIUM 9.3  --  8.1* 8.1* 8.1* 7.6*  MG  --  1.9  --  2.1  --  2.3  PHOS  --   --  5.4* 6.0*  --  9.1*   GFR: CrCl cannot be calculated (Unknown ideal weight.). Recent Labs  Lab 03/24/2018 1656 03/09/18 1040 03/10/18 0503 03/11/18 0535  WBC 6.3 6.6 8.4 12.1*    Liver Function Tests: Recent Labs  Lab 03/14/2018 1656 03/09/18 1055 03/10/18 0503  AST 27  --   --   ALT 26  --   --   ALKPHOS 97  --   --   BILITOT 0.7  --   --   PROT 7.4  --   --   ALBUMIN 3.0* 2.1* 2.1*   No results for input(s): LIPASE, AMYLASE in the  last 168 hours. No results for input(s): AMMONIA in the last 168 hours.  ABG    Component Value Date/Time   PHART 7.437 03/09/2018 0939   PCO2ART 33.9 03/09/2018 0939   PO2ART 105.0 03/09/2018 0939   HCO3 22.9 03/09/2018 0939   TCO2 24 03/09/2018 0939   ACIDBASEDEF 1.0 03/09/2018 0939   O2SAT 98.0 03/09/2018 0939     Coagulation Profile: Recent Labs  Lab 04/04/2018 1656  INR 1.00    Cardiac Enzymes: Recent Labs  Lab 03/09/18 0019 03/09/18 0432 03/09/18 1040 03/09/18 1526 03/09/18 2224  TROPONINI 6.47* 5.08* 4.70* 3.81* 3.88*    HbA1C: Hgb A1c MFr Bld  Date/Time Value Ref Range Status  03/09/2018 04:32 AM 7.1 (H) 4.8 - 5.6 % Final    Comment:    (NOTE) Pre diabetes:          5.7%-6.4% Diabetes:              >6.4% Glycemic control for   <7.0% adults with diabetes   05/31/2017 03:59 PM 6.7 (H) 4.8 - 5.6 % Final    Comment:    (NOTE) Pre diabetes:          5.7%-6.4% Diabetes:              >6.4% Glycemic control for   <7.0% adults with diabetes     CBG: Recent Labs  Lab 03/10/18 1538 03/10/18 1944 03/10/18 2320 03/11/18 0314 03/11/18 0759  GLUCAP 96 123* 138* 145* 159*    Critical care time:  34 mins    Noe Gens, NP-C [facility] Pgr: [phone] or if no answer [phone] 03/11/2018, 8:23 AM

## 2018-03-11 NOTE — Progress Notes (Signed)
Subjective:  Remains intubated, sedated. Spontaneous movements left side.     Objective:  Vital Signs in the last 24 hours: Temp:  [98.1 F (36.7 C)-99.2 F (37.3 C)] 99.2 F (37.3 C) (01/04 1600) Pulse Rate:  [65-79] 65 (01/04 1900) Resp:  [18-30] 20 (01/04 1900) BP: (130-183)/(50-65) 180/51 (01/04 1523) SpO2:  [88 %-100 %] 100 % (01/04 1900) Arterial Line BP: (130-180)/(46-65) 159/46 (01/04 1900) FiO2 (%):  [30 %] 30 % (01/04 1600)  Intake/Output from previous day: 01/03 0701 - 01/04 0700 In: 2086.5 [I.V.:2086.5] Out: 125 [Urine:125] Intake/Output from this shift: No intake/output data recorded.  Physical Exam: Nursing note and vitals reviewed. Constitutional: She appears well-developed and well-nourished.  Intubated and sedated  HENT:  Head: Normocephalic and atraumatic.  Eyes: Pupils are equal, round, and reactive to light.  Neck: No JVD present.  Cardiovascular: S1 normal and S2 normal.  Murmur heard.  Crescendo systolic murmur is present with a grade of 2/6.  Diastolic (Mitral area) murmur is present with a grade of 1/6. Pulses:      Carotid pulses are on the right side with bruit and on the left side with bruit.      Femoral pulses are 1+ on the right side and 1+ on the left side.      Popliteal pulses are 0 on the right side and 0 on the left side.       Dorsalis pedis pulses are 0 on the right side and 0 on the left side.       Posterior tibial pulses are 0 on the right side and 0 on the left side.  Lymphadenopathy:    She has no cervical adenopathy.  Neurological: Spontaneous movements on left side, no purposeful movements. Abdomen: Nontender   Lab Results: Recent Labs    03/10/18 0503 03/11/18 0535  WBC 8.4 12.1*  HGB 9.0* 9.3*  PLT 145* 116*   Recent Labs    03/10/18 1725 03/11/18 0535  NA 140 138  K 5.1 4.7  CL 113* 110  CO2 15* 13*  GLUCOSE 155* 188*  BUN 42* 56*  CREATININE 4.27* 4.68*   Recent Labs    03/09/18 1526 03/09/18 2224   TROPONINI 3.81* 3.88*   Hepatic Function Panel Recent Labs    03/10/18 0503  ALBUMIN 2.1*   Recent Labs    03/09/18 0432  CHOL 141   Cardiac studies: EKG 03/13/2018: Sinus or ectopic atrial tachycardia. Left atrial enlargement. IVCD. Nonspecific lateral ST-T changes.  Other than rate increase, no changes compated to previous EKG in 05/2017.  Hospital echocardiogram 04/02/2018: - Left ventricle: The cavity size was mildly dilated. There was severe concentric hypertrophy. Systolic function was moderately to severely reduced. The estimated ejection fraction was in the range of 30% to 35%. Mod global and severe inferior/inferolateral hypokinesis. Diastolic function assessment limited due to severe MAC. - Aortic valve: Moderately calcified annulus. Trileaflet; moderately calcified leaflets. Echodense projection seen off right aortic cusp on aortic side, not independently mobile. Seen partly on previous study in 05/2017. This most likely represents calcification or healed vegetation. There was trace stenosis. - Mitral valve: Severely thickened annulus. Severely calcified leaflets . Inadequate Doppler assessment to evaluate for mitral stenosis. Visually, appears to be mild calcific MS. - Left atrium: The atrium was severely dilated. - Pericardium, extracardiac: Trace posterior pericardial effusion. - Compared to previous study in 05/2017, LVEF is mildly reduced. Valvular calcifications and atheromatous calcifications in aortic root more prominently seen.  Cath 06/22/2016:  Colon Flattery  RCA lesion, 100 %stenosed.  Ost 1st Diag to 1st Diag lesion, 80 %stenosed.  Ost 2nd Mrg to 2nd Mrg lesion, 80 %stenosed.  There is mild left ventricular systolic dysfunction.  LV end diastolic pressure is normal.  The left ventricular ejection fraction is 35-45% by visual estimate.   Cerebral angiography 06/22/2016: Arch aortogram reveals type A arch, bilateral  vertebral artery is occluded. Right subclavian artery with a high-grade and complex calcified 90% stenosis at its origin followed by ectasia and a tandem 90% stenosis. Right vertebral occluded. Right ICA calcific 60-70% stenosis. Left ICA complex calcified 90% stenosis.  Bilateral selective renal angiogram: Widely patent renal artery stents, left kidney appears shrunken.  Coronary angiography 06/22/2016: LVEF 40-45% with diffuse hypokinesis, marked LVH, normal LVEDP. RCA dominant and is occluded in the proximal segment with bridging collaterals. There is also contralateral collaterals. Left main, LAD calcific disease without high-grade stenosis, diagonal 1 with a ostial 80% stenosis. Circumflex coronary artery OM1 large with a ostial 90% stenosis.  Complication: There was no immediate complication, however patient developed chest pain, fatigue and not feeling well when we reduced her initial resenting blood pressure from 300/60 mmHg down to 150/45 mmHg. Patient received atropine to improve blood pressure, patient immediately felt well.  Recommendation: Extremely complex situation with a high-grade stenosis of left ICA and diffuse coronary artery disease, cardiomyopathy probably related to hypertension with hypertensive heart disease then coronary artery disease. Would recommend medical therapy for CAD. I will discuss with Dr. Oneida Alar regarding revascularization strategy of the left carotid artery. 100 mL of contrast utilized.  Assessment/Recommednations:  73 year old [name] with severe atherosclerotic cardiovascular disease with medically managed diffuse CAD, severe PAD including prior embolic CVA, b/l renal artery stenting, Rt CIA stenting, h/o left carotid endarterctomy, b/l subclavian artery stenosis, Jehovah's witness, type 2 DM, hypertension, now with left acute left MCA CVA, treated with tPA and mechanical thrombectomy. Cardiology consulted for troponin  elevation.  NSTEMI: Medical management with DAPT and heparin, if and when deemed safe by Neurology. Severe vasculapath with periprocedural risk. Risks of invasive management outweighs benefits in patient with recent stroke. Continue DAPT with aspirin, plavix, lipitor 80 mg. Uptitrate coreg to 6.125 mg bid.  HFrEF: EF mildly reduced compared to baseline. Will consider heart failure medical therapy when permissive hypertension period is over.  Appears euvolemic on exam.  Hold Lasix and in light of worsening creatinine and oliguria.    Ischemic stroke: Management as per neurology.   AKI/CKD 3: Management per the primary team. Prophylaxis  Overall prognosis guarded. Conservative management plan discussed at bedside and over the phone with patient's daughters [place] and [place] and son Franchot Mimes.   Cardiology signing off at this time. Please contact us back, if any questions.    LOS: 3 days    Chenille Toor J Dyasia Firestine 03/11/2018, 7:22 PM  [place], MD [facility] [place] Pager: [phone] Office: [phone] If no answer Cell [phone]

## 2018-03-11 NOTE — Progress Notes (Signed)
STROKE TEAM PROGRESS NOTE   SUBJECTIVE (INTERVAL HISTORY) Her two daughters and son  are at the bedside.  She was still intubated,off sedation for 2 hrs.  BP fro is elevated  F/U CT shows slight increase in petechial right frontal hemorrhage now 5 mm OBJECTIVE Temp:  [98 F (36.7 C)-99.1 F (37.3 C)] 98.2 F (36.8 C) (01/04 0800) Pulse Rate:  [51-74] 70 (01/04 0900) Cardiac Rhythm: Normal sinus rhythm (01/04 0800) Resp:  [18-32] 24 (01/04 0900) BP: (130-172)/(46-56) 172/54 (01/04 0718) SpO2:  [89 %-100 %] 96 % (01/04 0900) Arterial Line BP: (105-180)/(35-61) 180/61 (01/04 0900) FiO2 (%):  [30 %] 30 % (01/04 0718)  Recent Labs  Lab 03/10/18 1538 03/10/18 1944 03/10/18 2320 03/11/18 0314 03/11/18 0759  GLUCAP 96 123* 138* 145* 159*   Recent Labs  Lab 03/26/2018 1656 03/09/18 1040 03/09/18 1055 03/10/18 0503 03/10/18 1725 03/11/18 0535  NA 140  --  139 141 140 138  K 3.3*  --  3.6 4.1 5.1 4.7  CL 101  --  107 110 113* 110  CO2 25  --  21* 20* 15* 13*  GLUCOSE 191*  --  174* 171* 155* 188*  BUN 32*  --  25* 32* 42* 56*  CREATININE 2.25*  --  2.06* 2.97* 4.27* 4.68*  CALCIUM 9.3  --  8.1* 8.1* 8.1* 7.6*  MG  --  1.9  --  2.1  --  2.3  PHOS  --   --  5.4* 6.0*  --  9.1*   Recent Labs  Lab 04/04/2018 1656 03/09/18 1055 03/10/18 0503  AST 27  --   --   ALT 26  --   --   ALKPHOS 97  --   --   BILITOT 0.7  --   --   PROT 7.4  --   --   ALBUMIN 3.0* 2.1* 2.1*   Recent Labs  Lab 04/02/2018 1642 04/01/2018 1656 03/09/18 1040 03/10/18 0503 03/11/18 0535  WBC  --  6.3 6.6 8.4 12.1*  NEUTROABS  --  3.2  --   --   --   HGB 13.9 12.2 9.5* 9.0* 9.3*  HCT 41.0 38.5 30.1* 28.8* 28.9*  MCV  --  94.1 95.3 96.0 95.4  PLT  --  174 150 145* 116*   Recent Labs  Lab 03/09/18 0019 03/09/18 0432 03/09/18 1040 03/09/18 1526 03/09/18 2224  TROPONINI 6.47* 5.08* 4.70* 3.81* 3.88*   Recent Labs    03/29/2018 1656  LABPROT 13.1  INR 1.00   No results for input(s): COLORURINE,  LABSPEC, PHURINE, GLUCOSEU, HGBUR, BILIRUBINUR, KETONESUR, PROTEINUR, UROBILINOGEN, NITRITE, LEUKOCYTESUR in the last 72 hours.  Invalid input(s): APPERANCEUR     Component Value Date/Time   CHOL 141 03/09/2018 0432   TRIG 120 03/09/2018 0432   HDL 25 (L) 03/09/2018 0432   CHOLHDL 5.6 03/09/2018 0432   VLDL 24 03/09/2018 0432   LDLCALC 92 03/09/2018 0432   Lab Results  Component Value Date   HGBA1C 7.1 (H) 03/09/2018   No results found for: LABOPIA, COCAINSCRNUR, LABBENZ, AMPHETMU, THCU, LABBARB  No results for input(s): ETH in the last 168 hours.  I have personally reviewed the radiological images below and agree with the radiology interpretations.  Ct Angio Head W Or Wo Contrast  Result Date: 04/04/2018 CLINICAL DATA:  Code stroke, deficit not specified EXAM: CT ANGIOGRAPHY HEAD AND NECK TECHNIQUE: Multidetector CT imaging of the head and neck was performed using the standard protocol during bolus administration  of intravenous contrast. Multiplanar CT image reconstructions and MIPs were obtained to evaluate the vascular anatomy. Carotid stenosis measurements (when applicable) are obtained utilizing NASCET criteria, using the distal internal carotid diameter as the denominator. CONTRAST:  41mL ISOVUE-370 IOPAMIDOL (ISOVUE-370) INJECTION 76% COMPARISON:  None. FINDINGS: CTA NECK FINDINGS Aortic arch: Extensive atherosclerotic calcification. Three vessel branching Right carotid system: Diffuse mainly calcified atherosclerotic plaque which limits visualization of the lumen due to plaque blooming. There is distal common carotid stenosis that measures up to 70% (when compared to the more proximal vessel given the immediate downstream bifurcation). There is ICA bulb plaque with 70% stenosis. Left carotid system: Much less extensive atherosclerotic plaque, question prior carotid endarterectomy. No stenosis or ulceration. Vertebral arteries: Severe bilateral proximal subclavian stenosis, with possible  occlusion on the right. Narrowing on the left involves and extensive segment. No flow is seen within vertebral arteries until the V3 segments, there may be retrograde flow Skeleton: Diffuse degenerative disease.  No acute finding Other neck: Bilateral cataract resection. Upper chest: Hazy density of the lungs which could be from atelectasis. No Kerley lines to implicate edema. Review of the MIP images confirms the above findings CTA HEAD FINDINGS Anterior circulation: Confluent atherosclerotic calcification on the carotid siphons with small vessel size and plaque blooming limiting luminal visualization. There is a left M2 branch occlusion beginning at the M1 bifurcation with subsequent downstream reconstitution. No contralateral embolism is seen. Posterior circulation: Tiny vertebrobasilar arteries. There may be a basilar interruption from atherosclerosis or developmental variant. Fetal type flow to both posterior cerebral arteries which are symmetrically patent Venous sinuses: Patent as permitted by contrast timing Anatomic variants: As above Delayed phase: Not obtained in the emergent setting Review of the MIP images confirms the above findings Case discussed with Dr. Rory Percy at 5:04 p.m. IMPRESSION: 1. Left M2 branch occlusion beginning at the M1 bifurcation. 2. Severe atherosclerosis which preferentially spares the left carotid circulation, question prior endarterectomy. 3. Severe atheromatous narrowing if not occlusion of bilateral proximal subclavian arteries. There is bilateral fetal type PCA with diffuse thready flow in the posterior fossa. 4. 70% atheromatous narrowing of the right common carotid and ICA bulb. Electronically Signed   By: Monte Fantasia M.D.   On: 03/30/2018 17:16   Ct Head Wo Contrast  Result Date: 03/11/2018 CLINICAL DATA:  Follow-up examination for acute stroke. EXAM: CT HEAD WITHOUT CONTRAST TECHNIQUE: Contiguous axial images were obtained from the base of the skull through the vertex  without intravenous contrast. COMPARISON:  Comparison made with prior MRI from 03/09/2018 as well as CT from 03/15/2018. FINDINGS: Brain: There has been continued interval evolution of small to moderate sized left MCA territory infarct, overall stable in distribution as compared to previous MRI. Mild localized edema without significant mass effect. Additional involving subcentimeter left cerebellar infarct noted as well. No evidence for hemorrhagic transformation or other complication. Previously noted additional punctate subcentimeter infarcts not well seen by CT. Subcentimeter hyperdensity involving the right frontal cortex slightly increased in size on today's exam measuring 5 mm, consistent with previously identified small hemorrhage. No significant mass effect or edema. No other evidence for new or interval infarction. No other acute large vessel territory infarct. Underlying atrophy with chronic microvascular ischemic disease noted, stable. Vascular: No hyperdense vessel. Calcified atherosclerosis noted at the skull base. Skull: Scalp soft tissues and calvarium within normal limits. Sinuses/Orbits: Globes and orbital soft tissues demonstrate no acute finding. Paranasal sinuses and mastoid air cells remain clear. Other: None. IMPRESSION: 1. Normal expected  interval evolution of small to moderate sized left MCA territory infarcts. No evidence for hemorrhagic transformation or other complication. Additional previously identified subcentimeter acute ischemic infarcts not well visualized by CT. 2. Slight interval increase in size of cortically based right frontal parenchymal hemorrhage, now measuring 5 mm (previously 3 mm). 3. No other new acute intracranial abnormality. Electronically Signed   By: Jeannine Boga M.D.   On: 03/11/2018 03:41   Ct Head Wo Contrast  Result Date: 03/26/2018 CLINICAL DATA:  Post thrombectomy EXAM: CT HEAD WITHOUT CONTRAST TECHNIQUE: Contiguous axial images were obtained from  the base of the skull through the vertex without intravenous contrast. COMPARISON:  Head CT from earlier today FINDINGS: Brain: 3 mm focus of high density along the high and posterior right frontal convexity, on reformats this appears to be intraparenchymal. No hemorrhagic conversion or visible infarct in the area of procedure. Multiple remote small vessel infarcts. Vascular: Major vessels are symmetrically enhancing. Skull: Negative Sinuses/Orbits: Negative These results were called by telephone at the time of interpretation on 03/11/2018 at 8:17 pm to Dr. Cheral Marker , who verbally acknowledged these results. IMPRESSION: New 3 mm high-density within the posterior right frontal cortex which could be focus of hemorrhage or an enhancing subacute infarct. No hemorrhage or infarct seen along the postoperative left MCA distribution. Electronically Signed   By: Monte Fantasia M.D.   On: 03/22/2018 20:18   Ct Angio Neck W Or Wo Contrast  Result Date: 03/16/2018 CLINICAL DATA:  Code stroke, deficit not specified EXAM: CT ANGIOGRAPHY HEAD AND NECK TECHNIQUE: Multidetector CT imaging of the head and neck was performed using the standard protocol during bolus administration of intravenous contrast. Multiplanar CT image reconstructions and MIPs were obtained to evaluate the vascular anatomy. Carotid stenosis measurements (when applicable) are obtained utilizing NASCET criteria, using the distal internal carotid diameter as the denominator. CONTRAST:  9mL ISOVUE-370 IOPAMIDOL (ISOVUE-370) INJECTION 76% COMPARISON:  None. FINDINGS: CTA NECK FINDINGS Aortic arch: Extensive atherosclerotic calcification. Three vessel branching Right carotid system: Diffuse mainly calcified atherosclerotic plaque which limits visualization of the lumen due to plaque blooming. There is distal common carotid stenosis that measures up to 70% (when compared to the more proximal vessel given the immediate downstream bifurcation). There is ICA bulb plaque  with 70% stenosis. Left carotid system: Much less extensive atherosclerotic plaque, question prior carotid endarterectomy. No stenosis or ulceration. Vertebral arteries: Severe bilateral proximal subclavian stenosis, with possible occlusion on the right. Narrowing on the left involves and extensive segment. No flow is seen within vertebral arteries until the V3 segments, there may be retrograde flow Skeleton: Diffuse degenerative disease.  No acute finding Other neck: Bilateral cataract resection. Upper chest: Hazy density of the lungs which could be from atelectasis. No Kerley lines to implicate edema. Review of the MIP images confirms the above findings CTA HEAD FINDINGS Anterior circulation: Confluent atherosclerotic calcification on the carotid siphons with small vessel size and plaque blooming limiting luminal visualization. There is a left M2 branch occlusion beginning at the M1 bifurcation with subsequent downstream reconstitution. No contralateral embolism is seen. Posterior circulation: Tiny vertebrobasilar arteries. There may be a basilar interruption from atherosclerosis or developmental variant. Fetal type flow to both posterior cerebral arteries which are symmetrically patent Venous sinuses: Patent as permitted by contrast timing Anatomic variants: As above Delayed phase: Not obtained in the emergent setting Review of the MIP images confirms the above findings Case discussed with Dr. Rory Percy at 5:04 p.m. IMPRESSION: 1. Left M2 branch occlusion beginning  at the M1 bifurcation. 2. Severe atherosclerosis which preferentially spares the left carotid circulation, question prior endarterectomy. 3. Severe atheromatous narrowing if not occlusion of bilateral proximal subclavian arteries. There is bilateral fetal type PCA with diffuse thready flow in the posterior fossa. 4. 70% atheromatous narrowing of the right common carotid and ICA bulb. Electronically Signed   By: Monte Fantasia M.D.   On: 03/12/2018 17:16    Mr Jodene Nam Head Wo Contrast  Result Date: 03/09/2018 CLINICAL DATA:  Stroke follow-up. Status post endovascular revascularization of left MCA occlusion yesterday. EXAM: MRI HEAD WITHOUT CONTRAST MRA HEAD WITHOUT CONTRAST TECHNIQUE: Multiplanar, multiecho pulse sequences of the brain and surrounding structures were obtained without intravenous contrast. Angiographic images of the head were obtained using MRA technique without contrast. COMPARISON:  Head CT and CTA 04/04/2018. Brain MRI 07/20/2016. Head MRA 09/30/2011. FINDINGS: MRI HEAD FINDINGS Brain: There is a small to slightly moderate-sized acute left MCA infarct involving the insula, predominantly posterior frontal lobe as well as operculum, and small amount of the postcentral gyrus. There also punctate acute infarcts involving the left caudate nucleus, right centrum semiovale, parasagittal right parietal cortex, and likely left occipital white matter. There are also small acute infarcts in the left greater than right cerebellar hemispheres. Susceptibility artifact in the right precentral gyrus corresponds to the 4 mm acute hemorrhage on yesterday's CT with minimal surrounding edema. There are numerous chronic microhemorrhages clustered about the right insula and operculum which are new from the 2018 MRI. Multiple additional chronic microhemorrhages are present in the basal ganglia bilaterally and scattered throughout both cerebral hemispheres and cerebellum. Patchy T2 hyperintensities in the cerebral white matter and pons have progressed from the prior MRI and are nonspecific but compatible with moderate chronic small-vessel ischemic disease. There are new chronic lacunar infarcts in the cerebral white matter, and there are chronic lacunar infarcts in the basal ganglia bilaterally. Small chronic infarcts in the cerebellar hemispheres have increased in number from the prior MRI. Mild cerebral atrophy is within normal limits for age. No mass, midline shift, or  extra-axial fluid collection is identified. Vascular: Diminutive vertebrobasilar circulation, more fully evaluated below. Skull and upper cervical spine: Unremarkable bone marrow signal. Sinuses/Orbits: Bilateral cataract extraction. Paranasal sinuses and mastoid air cells are clear. Other: Partially visualized endotracheal and enteric tubes. MRA HEAD FINDINGS The distal vertebral arteries are diminutive with diminished flow most notable in the distal right V3 and V4 segments and with bilateral proximal vertebral artery occlusion shown on yesterday's neck CTA. Patent bilateral PICA, right AICA, and bilateral SCA origins are identified. There is a chronic lack of visualization of the mid basilar artery which appears patent more proximally and distally, and this may reflect segmental atherosclerotic occlusion or a developmental variant. There are large posterior communicating arteries bilaterally with absence or severe hypoplasia of the right P1 segment. Both PCAs are patent without evidence of significant proximal stenosis. The internal carotid arteries are patent from skull base to carotid termini with extensive bilateral cavernous segment atherosclerotic irregularity resulting in mild stenosis on the right and moderate stenosis on the left. 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 ACAs and MCAs are patent with branch vessel irregularity but no evidence of proximal branch occlusion. The left MCA trifurcation remains patent following thrombectomy although there is moderate stenosis of the inferior and mid M2 origins. There also mild proximal left M1  and left A1 stenoses. IMPRESSION: 1. Small to moderate-sized acute left MCA infarct. 2. Additional predominantly subcentimeter acute infarcts in the cerebral and cerebellar hemispheres bilaterally. 3. 4 mm acute hemorrhage in the right  precentral gyrus as seen on CT yesterday. 4. Progressive chronic small vessel ischemia since 2018 with increased number of chronic microhemorrhages and chronic lacunar infarcts. 5. Advanced intracranial atherosclerosis. Revascularized left MCA bifurcation remains patent with moderate M2 origin stenoses. 6. Moderate ICA stenoses. 7. Diminished flow in the distal vertebral arteries with bilateral proximal occlusion shown on prior CTA. 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 8. 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 Electronically Signed   By: Logan Bores M.D.   On: 03/09/2018 17:17   Mr Brain Wo Contrast  Result Date: 03/09/2018 CLINICAL DATA:  Stroke follow-up. Status post endovascular revascularization of left MCA occlusion yesterday. EXAM: MRI HEAD WITHOUT CONTRAST MRA HEAD WITHOUT CONTRAST TECHNIQUE: Multiplanar, multiecho pulse sequences of the brain and surrounding structures were obtained without intravenous contrast. Angiographic images of the head were obtained using MRA technique without contrast. COMPARISON:  Head CT and CTA 04/06/2018. Brain MRI 07/20/2016. Head MRA 09/30/2011. FINDINGS: MRI HEAD FINDINGS Brain: There is a small to slightly moderate-sized acute left MCA infarct involving the insula, predominantly posterior frontal lobe as well as operculum, and small amount of the postcentral gyrus. There also punctate acute infarcts involving the left caudate nucleus, right centrum semiovale, parasagittal right parietal cortex, and likely left occipital white matter. There are also small acute infarcts in the left greater than right cerebellar hemispheres. Susceptibility artifact in the right precentral gyrus corresponds to the 4 mm acute hemorrhage on yesterday's CT with minimal surrounding edema. There are numerous chronic microhemorrhages clustered about the right insula  and operculum which are new from the 2018 MRI. Multiple additional chronic microhemorrhages are present in the basal ganglia bilaterally and scattered throughout both cerebral hemispheres and cerebellum. Patchy T2 hyperintensities in the cerebral white matter and pons have progressed from the prior MRI and are nonspecific but compatible with moderate chronic small-vessel ischemic disease. There are new chronic lacunar infarcts in the cerebral white matter, and there are chronic lacunar infarcts in the basal ganglia bilaterally. Small chronic infarcts in the cerebellar hemispheres have increased in number from the prior MRI. Mild cerebral atrophy is within normal limits for age. No mass, midline shift, or extra-axial fluid collection is identified. Vascular: Diminutive vertebrobasilar circulation, more fully evaluated below. Skull and upper cervical spine: Unremarkable bone marrow signal. Sinuses/Orbits: Bilateral cataract extraction. Paranasal sinuses and mastoid air cells are clear. Other: Partially visualized endotracheal and enteric tubes. MRA HEAD FINDINGS The distal vertebral arteries are diminutive with diminished flow most notable in the distal right V3 and V4 segments and with bilateral proximal vertebral artery occlusion shown on yesterday's neck CTA. Patent bilateral PICA, right AICA, and bilateral SCA origins are identified. There is a chronic lack of visualization of the mid basilar artery which appears patent more proximally and distally, and this may reflect segmental atherosclerotic occlusion or a developmental variant. There are large posterior communicating arteries bilaterally with absence or severe hypoplasia of the right P1 segment. Both PCAs are patent without evidence of significant proximal stenosis. The internal carotid arteries are patent from skull base to carotid termini with extensive bilateral cavernous segment atherosclerotic irregularity resulting in mild stenosis on the right and  moderate stenosis on the left.  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 ACAs and MCAs are patent with branch vessel irregularity but no evidence of proximal branch occlusion. The left MCA trifurcation remains patent following thrombectomy although there is moderate stenosis of the inferior and mid M2 origins. There also mild proximal left M1 and left A1 stenoses. IMPRESSION: 1. Small to moderate-sized acute left MCA infarct. 2. Additional predominantly subcentimeter acute infarcts in the cerebral and cerebellar hemispheres bilaterally. 3. 4 mm acute hemorrhage in the right precentral gyrus as seen on CT yesterday. 4. Progressive chronic small vessel ischemia since 2018 with increased number of chronic microhemorrhages and chronic lacunar infarcts. 5. Advanced intracranial atherosclerosis. Revascularized left MCA bifurcation remains patent with moderate M2 origin stenoses. 6. Moderate ICA stenoses. 7. Diminished flow in the distal vertebral arteries with bilateral proximal occlusion shown on prior CTA. 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 8. 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 Electronically Signed   By: Logan Bores M.D.   On: 03/09/2018 17:17   Ir Angiogram Extremity Bilateral  Result Date: 03/27/2018 INDICATION: 73 year old female with a history of acute left MCA stroke. She presents within time frame for IV tPA, and is a candidate for mechanical thrombectomy EXAM: ULTRASOUND GUIDED ACCESS RIGHT COMMON FEMORAL ARTERY ULTRASOUND GUIDED ACCESS LEFT COMMON FEMORAL ARTERY FOR ARTERIAL MONITORING ANGIOGRAM RIGHT LOWER EXTREMITY BALLOON ANGIOPLASTY STENOSIS RIGHT COMMON ILIAC ARTERY IN ORDER TO PASS 8 FRENCH SHEATH TO COMPLETE THE THROMBECTOMY CEREBRAL  ANGIOGRAM MECHANICAL THROMBECTOMY LEFT MCA EMERGENT LARGE VESSEL OCCLUSION DEPLOYMENT OF ANGIO-SEAL RIGHT COMMON FEMORAL ARTERY COMPARISON:  CT imaging of the same day MEDICATIONS: 2 g Ancef IV. The antibiotic was administered within 1 hour of the procedure ANESTHESIA/SEDATION: General endotracheal tube anesthesia CONTRAST:  96 cc Isovue-300 FLUOROSCOPY TIME:  Fluoroscopy Time: 26 minutes 24 seconds (1,362 mGy). COMPLICATIONS: None TECHNIQUE: Informed written consent was obtained from the patient's family after a thorough discussion of the procedural risks, benefits and alternatives. Specific risks discussed include: Bleeding, infection, contrast reaction, kidney injury/failure, need for further procedure/surgery, arterial injury or dissection, embolization to new territory, intracranial hemorrhage (10-15% risk), neurologic deterioration, cardiopulmonary collapse, death. All questions were addressed. Maximal Sterile Barrier Technique was utilized including during the procedure including caps, mask, sterile gowns, sterile gloves, sterile drape, hand hygiene and skin antiseptic. A timeout was performed prior to the initiation of the procedure. The anesthesia team was present to provide general endotracheal tube anesthesia and for patient monitoring during the procedure. Interventional neuro radiology nursing staff was also present. 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 Surgical changes of prior endarterectomy evident on prior CT imaging. Left external carotid artery: Patent with antegrade flow. Left internal carotid artery: Normal course caliber and contour of the cervical portion. Vertical and petrous segment patent with normal course caliber contour. Cavernous segment patent. Clinoid segment patent. Antegrade flow of the ophthalmic artery. Ophthalmic segment patent. Terminus patent. Left MCA:  Distal M1 occluded at the initiation of the case. There is partial filling of an inferior branch which is the non dominant branch to the frontal. Patent fetal PCA to the left P2 segment. Left ACA: A 1 segment patent. A 2 segment perfuses the right territory. Completion  Findings: Left MCA: After 2 passes of mechanical thrombectomy plus aspiration, there is near complete restoration of flow through the left MCA territory, with slow flow through an M3/M4 branch of the parietal region compatible with TI CI 2b flow. 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 Physical exam At the initiation of the case, palpable bilateral common femoral artery pulses present No palpable distal pulses Doppler positive pulses of the right posterior tibial and anterior tibial (with systolic blood pressures above 200) Doppler positive pulse at the left posterior tibial (with systolic blood pressure above 200), with no Doppler signal at the left anterior tibial At completion of case Doppler positive pulses were only discovered on the right posterior tibial and anterior tibial arteries with blood pressures above 200. In the 140-160 range (goal) Doppler signal was not evident A completion of case Doppler positive pulses were only discovered on the left at the posterior tibial and not the left anterior tibial (with the pressure above 409 systolic). PROCEDURE: Patient is brought emergently to the neuro angiography suite, with the patient identified appropriately and placed supine position on the table. Left radial arterial line was attempted by the anesthesia team. The patient is then prepped and draped in the usual sterile fashion. Ultrasound survey of the right inguinal region was performed with images stored and sent to PACs. 11 blade scalpel was used to make a small incision. Blunt dissection was performed. A micropuncture needle was used access the right common femoral artery under ultrasound. With  excellent arterial blood flow returned, an .018 micro wire was passed through the needle, observed to enter the abdominal aorta under fluoroscopy. The needle was removed, and a micropuncture sheath was placed over the wire. The inner dilator and wire were removed, and an 035 Bentson wire was advanced under fluoroscopy into the abdominal aorta. The sheath was removed and a standard 5 [place] vascular sheath was placed. The dilator was removed and the sheath was flushed. Ultrasound survey of the left inguinal region was then performed with images stored and sent to PACs, confirming patency of the vessel. A micropuncture needle was used access the left common femoral artery under ultrasound. With excellent arterial blood flow returned, and an .018 micro wire was passed through the needle, observed enter the abdominal aorta under fluoroscopy. The needle was removed, and a micropuncture sheath was placed over the wire. The inner dilator and wire were removed, and an 035 Bentson wire was advanced under fluoroscopy into the abdominal aorta. The sheath was removed and a standard 4 [place] vascular sheath was placed for arterial monitoring. The dilator was removed and the sheath was flushed. A 13F JB-1 diagnostic catheter was then advanced over the wire through the existing right femoral access to the proximal descending thoracic aorta. Wire was then removed. Double flush of the catheter was performed. Catheter was then used to select the left cervical internal carotid artery. Formal angiogram was performed, with roadmap achieved. Exchange length Rosen wire was then passed through the diagnostic catheter to the distal cervical ICA and the diagnostic catheter was removed. The 5 French sheath was removed, with attempt at placing 8 French 55 cm bright tip sheath. Significant resistance was encountered with passing the sheath through the right iliac system. The sheath would not advanced through the pre-existing right common iliac  artery stent. Sheath was withdrawn and rotated. We attempted to advance the sheath over the introducer which was pinned on the wire. We also removed the introducer in place  to diagnostic catheter into the aorta and attempted to pass the sheath over the diagnostic catheter. None of these maneuvers were successful. Sheath was then withdrawn into the external iliac artery. Balloon angioplasty was performed of the pre-existing right common iliac artery stent with 5 mm x 40 mm standard balloon angioplasty. Balloon was removed and the introducer was then replaced. Eight French sheath was then again attempted to pass over the wire, unsuccessful through this segment of the iliac artery. The introducer was removed, diagnostic catheter was placed into the cervical segment of the internal carotid artery, and the Rose an wire was exchanged for a 260 cm Amplatz wire. With the stiff wire in place, another attempt was made to pass the sheath which was unsuccessful. Sheath was then again withdrawn to the external iliac artery, introducer was removed, and a second balloon angioplasty 6 mm x 40 mm was performed at the pre-existing stent. The sheath was then successfully advanced over the balloon as the balloon was deflated, into the abdominal aorta. The introducer was again placed after removing the balloon and the sheath was advanced 6 successfully into the aorta. Introducer was withdrawn. Once the sheath was advanced over the wire to the thoracic aorta, sheath was flushed and attached to pressurized and heparinized saline bag for constant forward flow. Eight French 95 cm flow gate balloon catheter was then advanced over the wire to the distal cervical segment. Wire was removed. Then a coaxial intermediate catheter and microcatheter combination was prepared on the back table. This combination was penumbra Ace 64 catheter and a Trevo Provue18 microcatheter, with a synchro soft wire. This combination was then advanced through the  balloon guide into the ICA. System was advanced into the internal carotid artery, to the level of the occlusion. The micro wire was then carefully advanced through the occluded segment, with a distal knuckle configuration. Microcatheter was then pushed through the occluded segment and the wire was removed. Intermediate catheter was advanced over the micro wire to the carotid siphon, which would not advance beyond the ophthalmic segment through the terminus. Blood was then aspirated through the hub of the microcatheter, and a gentle contrast injection was performed confirming intraluminal position. A rotating hemostatic valve was then attached to the back end of the microcatheter, and a pressurized and heparinized saline bag was attached to the catheter. 4 mm x 40 mm solitaire device was then selected. Back flush was achieved at the rotating hemostatic valve, and then the device was gently advanced through the microcatheter to the distal end. The retriever was then unsheathed by withdrawing the microcatheter under fluoroscopy. Once the retriever was completely unsheathed, the microcatheter was carefully stripped from the delivery wire of the device. Control angiogram was then performed from the balloon catheter. 3 minute time interval was observed. The balloon on the balloon guide was then inflated to profile of the vessel. Constant aspiration was then performed through the intermediate catheter, and constant gentle aspiration was performed at the balloon guide. This aspiration was continued as the retriever was gently and slowly withdrawn with fluoroscopic observation into the distal intermediate catheter. The entire system was then gently withdrawn from the intracranial ICA and into the balloon guide. Once the retriever was entirely removed from the system, free aspiration was confirmed at the hub of the balloon guide, with free blood return confirmed. Control angiogram was then performed. TICI 2a flow was  achieved. Repeat angiogram with multiple angulation demonstrated persistent occlusion of parietal branch. The same coaxial system was  again passed through the balloon guide catheter. The microcatheter system was then advanced through the occlusion of the parietal branch. Once the micro wire microcatheter were beyond the occlusion, the micro wire was removed and stentreiver device was deployed, stripping the microcatheter from the wire. After the device was deployed across the occlusion, the intermediate catheter was again attempted to advanced to the M1 segment. This was unsuccessful, and would not advance beyond the ophthalmic segment. The balloon at the balloon guide was inflated to profile of the vessel. Local aspiration was performed at the intermediate catheter upon withdrawal of the device under fluoroscopic observation, with aspiration at the balloon guide. Once the retrieve her and intermediate catheter were entirely removed from the system, free aspiration was confirmed at the hub of the intermediate catheter, with free blood return confirmed. Control angiogram was again performed. Improved flow was confirmed, TICI 2b. Balloon guide was withdrawn and angiogram of the cervical segment was performed. We then removed the balloon guide catheter and deployed an 8 French Angio-Seal at the right common femoral artery access. Angiogram was performed of the right sided access and the left-sided access before completing the case. Patient tolerated the procedure well and remained hemodynamically stable throughout. No complications were encountered. Estimated blood loss approximately 50 cc. IMPRESSION: Status post ultrasound guided access right common femoral artery for cerebral angiogram and a combination of aspiration and mechanical thrombectomy of left M1 emergent large vessel occlusion, and restoration of TICI 2b flow after 2 passes. Status post balloon angioplasty to 6 mm of proximal right common iliac artery for  treatment of a restrictive lesion in order to pass 8 French sheath to perform the thrombectomy. Status post deployment of Angio-Seal at the right common femoral artery. At the conclusion of the case, a 4 [place] sheath remains in left common femoral artery for arterial monitoring. Signed, Dulcy Fanny. Dellia Nims, RPVI [facility] Radiology PLAN: Formal CT after the case Right hip straight overnight status post 8 French device and Angio-Seal closure Left common femoral artery for French sheath may be removed when hemodynamic monitoring is no longer needed with manual pressure. Goal blood pressure 944 [id number] systolic given the incomplete flow restoration Electronically Signed   By: Corrie Mckusick D.O.   On: 04/03/2018 20:39   Ir US Guide Vasc Access Right  Result Date: 03/28/2018 INDICATION: 73 year old female with a history of acute left MCA stroke. She presents within time frame for IV tPA, and is a candidate for mechanical thrombectomy EXAM: ULTRASOUND GUIDED ACCESS RIGHT COMMON FEMORAL ARTERY ULTRASOUND GUIDED ACCESS LEFT COMMON FEMORAL ARTERY FOR ARTERIAL MONITORING ANGIOGRAM RIGHT LOWER EXTREMITY BALLOON ANGIOPLASTY STENOSIS RIGHT COMMON ILIAC ARTERY IN ORDER TO PASS 8 FRENCH SHEATH TO COMPLETE THE THROMBECTOMY CEREBRAL ANGIOGRAM MECHANICAL THROMBECTOMY LEFT MCA EMERGENT LARGE VESSEL OCCLUSION DEPLOYMENT OF ANGIO-SEAL RIGHT COMMON FEMORAL ARTERY COMPARISON:  CT imaging of the same day MEDICATIONS: 2 g Ancef IV. The antibiotic was administered within 1 hour of the procedure ANESTHESIA/SEDATION: General endotracheal tube anesthesia CONTRAST:  96 cc Isovue-300 FLUOROSCOPY TIME:  Fluoroscopy Time: 26 minutes 24 seconds (1,362 mGy). COMPLICATIONS: None TECHNIQUE: Informed written consent was obtained from the patient's family after a thorough discussion of the procedural risks, benefits and alternatives. Specific risks discussed include: Bleeding, infection,  contrast reaction, kidney injury/failure, need for further procedure/surgery, arterial injury or dissection, embolization to new territory, intracranial hemorrhage (10-15% risk), neurologic deterioration, cardiopulmonary collapse, death. All questions were addressed. Maximal Sterile Barrier Technique was utilized including during the  procedure including caps, mask, sterile gowns, sterile gloves, sterile drape, hand hygiene and skin antiseptic. A timeout was performed prior to the initiation of the procedure. The anesthesia team was present to provide general endotracheal tube anesthesia and for patient monitoring during the procedure. Interventional neuro radiology nursing staff was also present. 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 Surgical changes of prior endarterectomy evident on prior CT imaging. Left external carotid artery: Patent with antegrade flow. Left internal carotid artery: Normal course caliber and contour of the cervical portion. Vertical and petrous segment patent with normal course caliber contour. Cavernous segment patent. Clinoid segment patent. Antegrade flow of the ophthalmic artery. Ophthalmic segment patent. Terminus patent. Left MCA: Distal M1 occluded at the initiation of the case. There is partial filling of an inferior branch which is the non dominant branch to the frontal. Patent fetal PCA to the left P2 segment. Left ACA: A 1 segment patent. A 2 segment perfuses the right territory. Completion Findings: Left MCA: After 2 passes of mechanical thrombectomy plus aspiration, there is near complete restoration of flow through the left MCA territory, with slow flow through an M3/M4 branch of the parietal region compatible with TI CI 2b flow. 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 Physical exam At the initiation of the case,  palpable bilateral common femoral artery pulses present No palpable distal pulses Doppler positive pulses of the right posterior tibial and anterior tibial (with systolic blood pressures above 200) Doppler positive pulse at the left posterior tibial (with systolic blood pressure above 200), with no Doppler signal at the left anterior tibial At completion of case Doppler positive pulses were only discovered on the right posterior tibial and anterior tibial arteries with blood pressures above 200. In the 140-160 range (goal) Doppler signal was not evident A completion of case Doppler positive pulses were only discovered on the left at the posterior tibial and not the left anterior tibial (with the pressure above 062 systolic). PROCEDURE: Patient is brought emergently to the neuro angiography suite, with the patient identified appropriately and placed supine position on the table. Left radial arterial line was attempted by the anesthesia team. The patient is then prepped and draped in the usual sterile fashion. Ultrasound survey of the right inguinal region was performed with images stored and sent to PACs. 11 blade scalpel was used to make a small incision. Blunt dissection was performed. A micropuncture needle was used access the right common femoral artery under ultrasound. With excellent arterial blood flow returned, an .018 micro wire was passed through the needle, observed to enter the abdominal aorta under fluoroscopy. The needle was removed, and a micropuncture sheath was placed over the wire. The inner dilator and wire were removed, and an 035 Bentson wire was advanced under fluoroscopy into the abdominal aorta. The sheath was removed and a standard 5 [place] vascular sheath was placed. The dilator was removed and the sheath was flushed. Ultrasound survey of the left inguinal region was then performed with images stored and sent to PACs, confirming patency of the vessel. A micropuncture needle was used access  the left common femoral artery under ultrasound. With excellent arterial blood flow returned, and an .018 micro wire was passed through the needle, observed enter the abdominal aorta under fluoroscopy. The needle was removed, and a micropuncture sheath was placed over the wire. The inner dilator and wire were removed,  and an 035 Bentson wire was advanced under fluoroscopy into the abdominal aorta. The sheath was removed and a standard 4 [place] vascular sheath was placed for arterial monitoring. The dilator was removed and the sheath was flushed. A 55F JB-1 diagnostic catheter was then advanced over the wire through the existing right femoral access to the proximal descending thoracic aorta. Wire was then removed. Double flush of the catheter was performed. Catheter was then used to select the left cervical internal carotid artery. Formal angiogram was performed, with roadmap achieved. Exchange length Rosen wire was then passed through the diagnostic catheter to the distal cervical ICA and the diagnostic catheter was removed. The 5 French sheath was removed, with attempt at placing 8 French 55 cm bright tip sheath. Significant resistance was encountered with passing the sheath through the right iliac system. The sheath would not advanced through the pre-existing right common iliac artery stent. Sheath was withdrawn and rotated. We attempted to advance the sheath over the introducer which was pinned on the wire. We also removed the introducer in place to diagnostic catheter into the aorta and attempted to pass the sheath over the diagnostic catheter. None of these maneuvers were successful. Sheath was then withdrawn into the external iliac artery. Balloon angioplasty was performed of the pre-existing right common iliac artery stent with 5 mm x 40 mm standard balloon angioplasty. Balloon was removed and the introducer was then replaced. Eight French sheath was then again attempted to pass over the wire, unsuccessful  through this segment of the iliac artery. The introducer was removed, diagnostic catheter was placed into the cervical segment of the internal carotid artery, and the Rose an wire was exchanged for a 260 cm Amplatz wire. With the stiff wire in place, another attempt was made to pass the sheath which was unsuccessful. Sheath was then again withdrawn to the external iliac artery, introducer was removed, and a second balloon angioplasty 6 mm x 40 mm was performed at the pre-existing stent. The sheath was then successfully advanced over the balloon as the balloon was deflated, into the abdominal aorta. The introducer was again placed after removing the balloon and the sheath was advanced 6 successfully into the aorta. Introducer was withdrawn. Once the sheath was advanced over the wire to the thoracic aorta, sheath was flushed and attached to pressurized and heparinized saline bag for constant forward flow. Eight French 95 cm flow gate balloon catheter was then advanced over the wire to the distal cervical segment. Wire was removed. Then a coaxial intermediate catheter and microcatheter combination was prepared on the back table. This combination was penumbra Ace 64 catheter and a Trevo Provue18 microcatheter, with a synchro soft wire. This combination was then advanced through the balloon guide into the ICA. System was advanced into the internal carotid artery, to the level of the occlusion. The micro wire was then carefully advanced through the occluded segment, with a distal knuckle configuration. Microcatheter was then pushed through the occluded segment and the wire was removed. Intermediate catheter was advanced over the micro wire to the carotid siphon, which would not advance beyond the ophthalmic segment through the terminus. Blood was then aspirated through the hub of the microcatheter, and a gentle contrast injection was performed confirming intraluminal position. A rotating hemostatic valve was then attached  to the back end of the microcatheter, and a pressurized and heparinized saline bag was attached to the catheter. 4 mm x 40 mm solitaire device was then selected. Back flush was achieved  at the rotating hemostatic valve, and then the device was gently advanced through the microcatheter to the distal end. The retriever was then unsheathed by withdrawing the microcatheter under fluoroscopy. Once the retriever was completely unsheathed, the microcatheter was carefully stripped from the delivery wire of the device. Control angiogram was then performed from the balloon catheter. 3 minute time interval was observed. The balloon on the balloon guide was then inflated to profile of the vessel. Constant aspiration was then performed through the intermediate catheter, and constant gentle aspiration was performed at the balloon guide. This aspiration was continued as the retriever was gently and slowly withdrawn with fluoroscopic observation into the distal intermediate catheter. The entire system was then gently withdrawn from the intracranial ICA and into the balloon guide. Once the retriever was entirely removed from the system, free aspiration was confirmed at the hub of the balloon guide, with free blood return confirmed. Control angiogram was then performed. TICI 2a flow was achieved. Repeat angiogram with multiple angulation demonstrated persistent occlusion of parietal branch. The same coaxial system was again passed through the balloon guide catheter. The microcatheter system was then advanced through the occlusion of the parietal branch. Once the micro wire microcatheter were beyond the occlusion, the micro wire was removed and stentreiver device was deployed, stripping the microcatheter from the wire. After the device was deployed across the occlusion, the intermediate catheter was again attempted to advanced to the M1 segment. This was unsuccessful, and would not advance beyond the ophthalmic segment. The balloon at  the balloon guide was inflated to profile of the vessel. Local aspiration was performed at the intermediate catheter upon withdrawal of the device under fluoroscopic observation, with aspiration at the balloon guide. Once the retrieve her and intermediate catheter were entirely removed from the system, free aspiration was confirmed at the hub of the intermediate catheter, with free blood return confirmed. Control angiogram was again performed. Improved flow was confirmed, TICI 2b. Balloon guide was withdrawn and angiogram of the cervical segment was performed. We then removed the balloon guide catheter and deployed an 8 French Angio-Seal at the right common femoral artery access. Angiogram was performed of the right sided access and the left-sided access before completing the case. Patient tolerated the procedure well and remained hemodynamically stable throughout. No complications were encountered. Estimated blood loss approximately 50 cc. IMPRESSION: Status post ultrasound guided access right common femoral artery for cerebral angiogram and a combination of aspiration and mechanical thrombectomy of left M1 emergent large vessel occlusion, and restoration of TICI 2b flow after 2 passes. Status post balloon angioplasty to 6 mm of proximal right common iliac artery for treatment of a restrictive lesion in order to pass 8 French sheath to perform the thrombectomy. Status post deployment of Angio-Seal at the right common femoral artery. At the conclusion of the case, a 4 [place] sheath remains in left common femoral artery for arterial monitoring. Signed, Dulcy Fanny. Dellia Nims, RPVI [facility] Interventional Radiology Special[facility] Radiology PLAN: Formal CT after the case Right hip straight overnight status post 8 French device and Angio-Seal closure Left common femoral artery for French sheath may be removed when hemodynamic monitoring is no longer needed with manual pressure. Goal blood pressure 098  [id number] systolic given the incomplete flow restoration Electronically Signed   By: Corrie Mckusick D.O.   On: 03/29/2018 20:39   Dg Chest Port 1 View  Result Date: 03/11/2018 CLINICAL DATA:  Acute respiratory failure with hypoxia. EXAM: PORTABLE CHEST 1  VIEW COMPARISON:  03/10/2018 FINDINGS: Endotracheal tube has tip 5.6 cm above the carina. Nasogastric tube unchanged with tip over the stomach in the left upper quadrant. Lungs are adequately inflated with minimal persistent left retrocardiac opacification. Mild stable cardiomegaly. Remainder of the exam is unchanged. IMPRESSION: Mild left retrocardiac opacification unchanged likely atelectasis. Mild stable cardiomegaly. Tubes and lines as described. Electronically Signed   By: Marin Olp M.D.   On: 03/11/2018 08:17   Dg Chest Port 1 View  Result Date: 03/10/2018 CLINICAL DATA:  Cerebral infarction and respiratory failure. EXAM: PORTABLE CHEST 1 VIEW COMPARISON:  03/09/2018 FINDINGS: Endotracheal tube remains present with the tip approximately 3 cm above the carina. Gastric decompression tube has been advanced and now extends further into the stomach. Stable significant cardiac enlargement. Lungs show some improved aeration at the right base with mild residual right basilar atelectasis remaining. Left lower lobe atelectasis is relatively stable. No pulmonary edema, significant pleural fluid or pneumothorax. IMPRESSION: Advancement of gastric decompression tube further into the stomach. Improved aeration at the right lung base with residual mild right basilar atelectasis. Stable left lower lobe atelectasis. Electronically Signed   By: Aletta Edouard M.D.   On: 03/10/2018 08:18   Portable Chest X-ray  Result Date: 03/27/2018 CLINICAL DATA:  Intubated EXAM: PORTABLE CHEST 1 VIEW COMPARISON:  06/01/2017 chest radiograph. FINDINGS: Endotracheal tube tip is 4.2 cm above the carina. Enteric tube terminates at the esophagogastric junction with the side port  in the lower thoracic esophagus. Stable cardiomediastinal silhouette with moderate cardiomegaly. No pneumothorax. No pleural effusion. Hazy lower parahilar lung opacities, most prominent in the right lower lung, worsened. IMPRESSION: 1. Well-positioned endotracheal tube. 2. Enteric tube terminates at the esophagogastric junction with the side port in the lower thoracic esophagus, consider advancing 8-10 cm. 3. Moderate cardiomegaly. Worsening hazy lower parahilar lung opacities, most prominent in the right lower lung, favor pulmonary edema. Electronically Signed   By: Ilona Sorrel M.D.   On: 03/16/2018 22:12   Ir Percutaneous Art Thrombectomy/infusion Intracranial Inc Diag Angio  Result Date: 04/05/2018 INDICATION: 73 year old female with a history of acute left MCA stroke. She presents within time frame for IV tPA, and is a candidate for mechanical thrombectomy EXAM: ULTRASOUND GUIDED ACCESS RIGHT COMMON FEMORAL ARTERY ULTRASOUND GUIDED ACCESS LEFT COMMON FEMORAL ARTERY FOR ARTERIAL MONITORING ANGIOGRAM RIGHT LOWER EXTREMITY BALLOON ANGIOPLASTY STENOSIS RIGHT COMMON ILIAC ARTERY IN ORDER TO PASS 8 FRENCH SHEATH TO COMPLETE THE THROMBECTOMY CEREBRAL ANGIOGRAM MECHANICAL THROMBECTOMY LEFT MCA EMERGENT LARGE VESSEL OCCLUSION DEPLOYMENT OF ANGIO-SEAL RIGHT COMMON FEMORAL ARTERY COMPARISON:  CT imaging of the same day MEDICATIONS: 2 g Ancef IV. The antibiotic was administered within 1 hour of the procedure ANESTHESIA/SEDATION: General endotracheal tube anesthesia CONTRAST:  96 cc Isovue-300 FLUOROSCOPY TIME:  Fluoroscopy Time: 26 minutes 24 seconds (1,362 mGy). COMPLICATIONS: None TECHNIQUE: Informed written consent was obtained from the patient's family after a thorough discussion of the procedural risks, benefits and alternatives. Specific risks discussed include: Bleeding, infection, contrast reaction, kidney injury/failure, need for further procedure/surgery, arterial injury or dissection, embolization to new  territory, intracranial hemorrhage (10-15% risk), neurologic deterioration, cardiopulmonary collapse, death. All questions were addressed. Maximal Sterile Barrier Technique was utilized including during the procedure including caps, mask, sterile gowns, sterile gloves, sterile drape, hand hygiene and skin antiseptic. A timeout was performed prior to the initiation of the procedure. The anesthesia team was present to provide general endotracheal tube anesthesia and for patient monitoring during the procedure. Interventional neuro radiology nursing staff was also  present. 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 Surgical changes of prior endarterectomy evident on prior CT imaging. Left external carotid artery: Patent with antegrade flow. Left internal carotid artery: Normal course caliber and contour of the cervical portion. Vertical and petrous segment patent with normal course caliber contour. Cavernous segment patent. Clinoid segment patent. Antegrade flow of the ophthalmic artery. Ophthalmic segment patent. Terminus patent. Left MCA: Distal M1 occluded at the initiation of the case. There is partial filling of an inferior branch which is the non dominant branch to the frontal. Patent fetal PCA to the left P2 segment. Left ACA: A 1 segment patent. A 2 segment perfuses the right territory. Completion Findings: Left MCA: After 2 passes of mechanical thrombectomy plus aspiration, there is near complete restoration of flow through the left MCA territory, with slow flow through an M3/M4 branch of the parietal region compatible with TI CI 2b flow. 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 Physical exam At the initiation of the case, palpable bilateral common femoral artery pulses present No palpable distal pulses Doppler positive pulses of the right posterior  tibial and anterior tibial (with systolic blood pressures above 200) Doppler positive pulse at the left posterior tibial (with systolic blood pressure above 200), with no Doppler signal at the left anterior tibial At completion of case Doppler positive pulses were only discovered on the right posterior tibial and anterior tibial arteries with blood pressures above 200. In the 140-160 range (goal) Doppler signal was not evident A completion of case Doppler positive pulses were only discovered on the left at the posterior tibial and not the left anterior tibial (with the pressure above 893 systolic). PROCEDURE: Patient is brought emergently to the neuro angiography suite, with the patient identified appropriately and placed supine position on the table. Left radial arterial line was attempted by the anesthesia team. The patient is then prepped and draped in the usual sterile fashion. Ultrasound survey of the right inguinal region was performed with images stored and sent to PACs. 11 blade scalpel was used to make a small incision. Blunt dissection was performed. A micropuncture needle was used access the right common femoral artery under ultrasound. With excellent arterial blood flow returned, an .018 micro wire was passed through the needle, observed to enter the abdominal aorta under fluoroscopy. The needle was removed, and a micropuncture sheath was placed over the wire. The inner dilator and wire were removed, and an 035 Bentson wire was advanced under fluoroscopy into the abdominal aorta. The sheath was removed and a standard 5 [place] vascular sheath was placed. The dilator was removed and the sheath was flushed. Ultrasound survey of the left inguinal region was then performed with images stored and sent to PACs, confirming patency of the vessel. A micropuncture needle was used access the left common femoral artery under ultrasound. With excellent arterial blood flow returned, and an .018 micro wire was passed  through the needle, observed enter the abdominal aorta under fluoroscopy. The needle was removed, and a micropuncture sheath was placed over the wire. The inner dilator and wire were removed, and an 035 Bentson wire was advanced under fluoroscopy into the abdominal aorta. The sheath was removed and a standard 4 [place] vascular sheath was placed for arterial monitoring. The dilator was removed and the sheath was flushed. A 38F JB-1 diagnostic catheter was then advanced over the wire through the  existing right femoral access to the proximal descending thoracic aorta. Wire was then removed. Double flush of the catheter was performed. Catheter was then used to select the left cervical internal carotid artery. Formal angiogram was performed, with roadmap achieved. Exchange length Rosen wire was then passed through the diagnostic catheter to the distal cervical ICA and the diagnostic catheter was removed. The 5 French sheath was removed, with attempt at placing 8 French 55 cm bright tip sheath. Significant resistance was encountered with passing the sheath through the right iliac system. The sheath would not advanced through the pre-existing right common iliac artery stent. Sheath was withdrawn and rotated. We attempted to advance the sheath over the introducer which was pinned on the wire. We also removed the introducer in place to diagnostic catheter into the aorta and attempted to pass the sheath over the diagnostic catheter. None of these maneuvers were successful. Sheath was then withdrawn into the external iliac artery. Balloon angioplasty was performed of the pre-existing right common iliac artery stent with 5 mm x 40 mm standard balloon angioplasty. Balloon was removed and the introducer was then replaced. Eight French sheath was then again attempted to pass over the wire, unsuccessful through this segment of the iliac artery. The introducer was removed, diagnostic catheter was placed into the cervical segment of  the internal carotid artery, and the Rose an wire was exchanged for a 260 cm Amplatz wire. With the stiff wire in place, another attempt was made to pass the sheath which was unsuccessful. Sheath was then again withdrawn to the external iliac artery, introducer was removed, and a second balloon angioplasty 6 mm x 40 mm was performed at the pre-existing stent. The sheath was then successfully advanced over the balloon as the balloon was deflated, into the abdominal aorta. The introducer was again placed after removing the balloon and the sheath was advanced 6 successfully into the aorta. Introducer was withdrawn. Once the sheath was advanced over the wire to the thoracic aorta, sheath was flushed and attached to pressurized and heparinized saline bag for constant forward flow. Eight French 95 cm flow gate balloon catheter was then advanced over the wire to the distal cervical segment. Wire was removed. Then a coaxial intermediate catheter and microcatheter combination was prepared on the back table. This combination was penumbra Ace 64 catheter and a Trevo Provue18 microcatheter, with a synchro soft wire. This combination was then advanced through the balloon guide into the ICA. System was advanced into the internal carotid artery, to the level of the occlusion. The micro wire was then carefully advanced through the occluded segment, with a distal knuckle configuration. Microcatheter was then pushed through the occluded segment and the wire was removed. Intermediate catheter was advanced over the micro wire to the carotid siphon, which would not advance beyond the ophthalmic segment through the terminus. Blood was then aspirated through the hub of the microcatheter, and a gentle contrast injection was performed confirming intraluminal position. A rotating hemostatic valve was then attached to the back end of the microcatheter, and a pressurized and heparinized saline bag was attached to the catheter. 4 mm x 40 mm  solitaire device was then selected. Back flush was achieved at the rotating hemostatic valve, and then the device was gently advanced through the microcatheter to the distal end. The retriever was then unsheathed by withdrawing the microcatheter under fluoroscopy. Once the retriever was completely unsheathed, the microcatheter was carefully stripped from the delivery wire of the device. Control angiogram was  then performed from the balloon catheter. 3 minute time interval was observed. The balloon on the balloon guide was then inflated to profile of the vessel. Constant aspiration was then performed through the intermediate catheter, and constant gentle aspiration was performed at the balloon guide. This aspiration was continued as the retriever was gently and slowly withdrawn with fluoroscopic observation into the distal intermediate catheter. The entire system was then gently withdrawn from the intracranial ICA and into the balloon guide. Once the retriever was entirely removed from the system, free aspiration was confirmed at the hub of the balloon guide, with free blood return confirmed. Control angiogram was then performed. TICI 2a flow was achieved. Repeat angiogram with multiple angulation demonstrated persistent occlusion of parietal branch. The same coaxial system was again passed through the balloon guide catheter. The microcatheter system was then advanced through the occlusion of the parietal branch. Once the micro wire microcatheter were beyond the occlusion, the micro wire was removed and stentreiver device was deployed, stripping the microcatheter from the wire. After the device was deployed across the occlusion, the intermediate catheter was again attempted to advanced to the M1 segment. This was unsuccessful, and would not advance beyond the ophthalmic segment. The balloon at the balloon guide was inflated to profile of the vessel. Local aspiration was performed at the intermediate catheter upon  withdrawal of the device under fluoroscopic observation, with aspiration at the balloon guide. Once the retrieve her and intermediate catheter were entirely removed from the system, free aspiration was confirmed at the hub of the intermediate catheter, with free blood return confirmed. Control angiogram was again performed. Improved flow was confirmed, TICI 2b. Balloon guide was withdrawn and angiogram of the cervical segment was performed. We then removed the balloon guide catheter and deployed an 8 French Angio-Seal at the right common femoral artery access. Angiogram was performed of the right sided access and the left-sided access before completing the case. Patient tolerated the procedure well and remained hemodynamically stable throughout. No complications were encountered. Estimated blood loss approximately 50 cc. IMPRESSION: Status post ultrasound guided access right common femoral artery for cerebral angiogram and a combination of aspiration and mechanical thrombectomy of left M1 emergent large vessel occlusion, and restoration of TICI 2b flow after 2 passes. Status post balloon angioplasty to 6 mm of proximal right common iliac artery for treatment of a restrictive lesion in order to pass 8 French sheath to perform the thrombectomy. Status post deployment of Angio-Seal at the right common femoral artery. At the conclusion of the case, a 4 [place] sheath remains in left common femoral artery for arterial monitoring. Signed, Dulcy Fanny. Dellia Nims, RPVI [facility] Radiology PLAN: Formal CT after the case Right hip straight overnight status post 8 French device and Angio-Seal closure Left common femoral artery for French sheath may be removed when hemodynamic monitoring is no longer needed with manual pressure. Goal blood pressure 678 [id number] systolic given the incomplete flow restoration Electronically Signed   By: Corrie Mckusick D.O.   On: 03/21/2018 20:39    Ct Head Code Stroke Wo Contrast  Result Date: 04/06/2018 CLINICAL DATA:  Code stroke.  Deficit not specified EXAM: CT HEAD WITHOUT CONTRAST TECHNIQUE: Contiguous axial images were obtained from the base of the skull through the vertex without intravenous contrast. COMPARISON:  Head CT 07/18/2016 and brain MRI 07/20/2016 FINDINGS: Brain: Multiple remote lacunar infarcts in the deep white matter and deep gray nuclei, chronic when compared to prior head  CT and brain MRI. Small remote left cerebellar infarct. No evidence of acute infarct. No hemorrhage or hydrocephalus. Vascular: No hyperdense vessel. Skull: Normal. Negative for fracture or focal lesion. Sinuses/Orbits: No acute finding. Other: These results were communicated to Dr. Rory Percy at 4:43 pmon 01/15/2020by text page via the [facility] messaging system. ASPECTS [facility] Stroke Program Early CT Score) Not scored without localizing symptoms. Motion artifact at the vertex blurring cortex. IMPRESSION: 1. No acute finding. 2. Motion artifact at the vertex. 3. Multiple remote small vessel infarcts. Electronically Signed   By: Monte Fantasia M.D.   On: 04/05/2018 16:45   Vas [place] Lower Extremity Venous (dvt)  Result Date: 03/09/2018  Lower Venous Study Indications: Stroke.  Limitations: Body habitus. Performing Technologist: Maudry Mayhew MHA, RDMS, RVT, RDCS  Examination Guidelines: A complete evaluation includes B-mode imaging, spectral Doppler, color Doppler, and power Doppler as needed of all accessible portions of each vessel. Bilateral testing is considered an integral part of a complete examination. Limited examinations for reoccurring indications may be performed as noted.  Right Venous Findings: +---------+---------------+---------+-----------+----------+--------------+          CompressibilityPhasicitySpontaneityPropertiesSummary        +---------+---------------+---------+-----------+----------+--------------+ CFV                                                    Not visualized +---------+---------------+---------+-----------+----------+--------------+ SFJ                                                   Not visualized +---------+---------------+---------+-----------+----------+--------------+ FV Prox  Full                                                        +---------+---------------+---------+-----------+----------+--------------+ FV Mid   Full                                                        +---------+---------------+---------+-----------+----------+--------------+ FV DistalFull                                                        +---------+---------------+---------+-----------+----------+--------------+ PFV      Full                                                        +---------+---------------+---------+-----------+----------+--------------+ POP      Full           Yes      Yes                                 +---------+---------------+---------+-----------+----------+--------------+  PTV      Full                                                        +---------+---------------+---------+-----------+----------+--------------+ PERO                                                  Not visualized +---------+---------------+---------+-----------+----------+--------------+  Left Venous Findings: +---------+---------------+---------+-----------+----------+-------+          CompressibilityPhasicitySpontaneityPropertiesSummary +---------+---------------+---------+-----------+----------+-------+ CFV      Full           Yes      Yes                          +---------+---------------+---------+-----------+----------+-------+ SFJ      Full                                                 +---------+---------------+---------+-----------+----------+-------+ FV Prox  Full                                                  +---------+---------------+---------+-----------+----------+-------+ FV Mid   Full                                                 +---------+---------------+---------+-----------+----------+-------+ FV DistalFull                                                 +---------+---------------+---------+-----------+----------+-------+ PFV      Full                                                 +---------+---------------+---------+-----------+----------+-------+ POP      Full           Yes      Yes                          +---------+---------------+---------+-----------+----------+-------+ PTV      Full                                                 +---------+---------------+---------+-----------+----------+-------+ PERO     Full                                                 +---------+---------------+---------+-----------+----------+-------+  Summary: Right: There is no evidence of deep vein thrombosis in the lower extremity. However, portions of this examination were limited- see technologist comments above. No cystic structure found in the popliteal fossa. Left: There is no evidence of deep vein thrombosis in the lower extremity. No cystic structure found in the popliteal fossa.  *See table(s) above for measurements and observations. Electronically signed by Curt Jews MD on 03/09/2018 at 5:40:37 PM.    Final     PHYSICAL EXAM  Temp:  [98 F (36.7 C)-99.1 F (37.3 C)] 98.2 F (36.8 C) (01/04 0800) Pulse Rate:  [51-74] 70 (01/04 0900) Resp:  [18-32] 24 (01/04 0900) BP: (130-172)/(46-56) 172/54 (01/04 0718) SpO2:  [89 %-100 %] 96 % (01/04 0900) Arterial Line BP: (105-180)/(35-61) 180/61 (01/04 0900) FiO2 (%):  [30 %] 30 % (01/04 0718)  General - Well nourished, well developed, intubated on sedation.  Ophthalmologic - fundi not visualized due to noncooperation.  Cardiovascular - Regular rate and rhythm.  Neuro - intubated now off  sedation,   she is  able to open eyes on voice stimulation but not following commands. Globally aphasic Eyes mid position, no deviation, however no doll's eyes.  Not blinking to visual threat bilaterally.  Pupil 2 mm bilaterally, reactive to light.  Left corneal present, right corneal weak reflexes.  Positive gag and cough.  Facial symmetry not able to test due to ET tube.  Left upper and lower extremity spontaneous movements against gravity2/5 on pain stimulation.  Right upper and lower extremity trace withdrawal to pain stimulaiton.  DTR 1+, Babinski negative. Sensation, coordination and gait not tested.   ASSESSMENT/PLAN [name] is a 73 y.o. female with history of stroke, hypertension, PVD, diabetes, hyperlipidemia, status post right CEA, CHF admitted for right-sided weakness and aphasia. TPA given.    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  However, cardioembolic source cannot be excluded.  Resultant intubated on sedation, right hemiparesis  CT showed multiple old lacunar infarcts  CTA head and neck left M1/M2 occlusion.  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  Right precentral gyrus petechial hemorrhage.  MRA left MCA patent with moderate M2 origin stenosis  CT head repeat shows slight increase in right precentral gyrus petechial hemorrhage given on DAPT  2D Echo EF 30 to 35% down from 40 to 45% in 05/2017  LE venous Doppler no DVT  LDL 92  HgbA1c 7.1  SCDs for VTE prophylaxis  aspirin 81 mg daily and clopidogrel 75 mg daily prior to admission, now on aspirin 81 and Plavix due to non-STEMI.   Ongoing aggressive stroke risk factor  management  Therapy recommendations: pending  Disposition: Pending  Multi-large vessel severe atherosclerosis and stenosis  CTA head and neck showed bilateral siphon, right ICA and a better subclavian high-grade stenosis  Hypoplastic posterior circulation with bilateral fetal PCAs  History of left CEA  History of PVD  History of CAD, following with Dr. Einar Gip as outpatient  If patient has reasonable neurological functional outcome, may consider outpatient follow-up with vascular surgery for subclavian artery stenting  Non-STEMI and cardiomyopathy  Troponin 4.62-6.47-5.08-4.7-3.8-3.88  EF 30 to 35% down from 40 to 45% in 05/2017  Cardiology on board  No intervention recommended  Started on aspirin and Plavix  CT head repeat 03/11/18 shows slight increase in [facility] in  right precentral gyrus petechial hemorrhage given on DAPT  Tapered off nitroglycerin IV  AKI on CKD stage III  creatinine 2.20-2.25->2.97  CCM on board  BMP monitoring  continue IV fluid   Diabetes  HgbA1c 7.1 goal < 7.0  Uncontrolled  CBG monitoring  SSI  Hypertension . Stable . Off Cleviprex  BP goal 120-160 now  BP monitoring by left femoral A-line.  Patient BP monitoring difficult in arms given bilateral severe subclavian artery stenosis and difficult to get from legs due to atherosclerosis  Hyperlipidemia  Home meds: Lipitor 80  LDL 92, goal < 70  Now on Lipitor 80  Continue statin at discharge  Other Stroke Risk Factors  Advanced age  Former cigarette smoker, quit smoking 43 years ago  Limit ETOH use  Hx stroke/TIA -09/2011 left pontine infarct, LDL 115 and A1c 8.3.  Put on DAPT and Lipitor 80  Other Active Problems  Hypokalemia-supplement  Hyperglycemia   Hospital day # 3 Plan add iv labetalol and hydralazine for SBP goal below 180. Consider renal consult for worsening renal failure. Fmily to decide on DNR/ one way extubation prior to extubation in next few days.  Lond d/w family and CCM team and answered questions. This patient is critically ill due to left MCA stroke, severe multivessel atherosclerosis and stenosis, CHF, non-STEMI, hyperglycemia and at significant risk of neurological worsening, death form recurrent stroke, hemorrhagic conversion, heart failure, seizure, DKA. This patient's care requires constant monitoring of vital signs, hemodynamics, respiratory and cardiac monitoring, review of multiple databases, neurological assessment, discussion with family, other specialists and medical decision making of high complexity. I spent 20minutes of neurocritical care time in the care of this patient. I had long discussion with her son and 2 daughters at bedside, updated pt current condition, treatment plan and potential prognosis. They expressed understanding and appreciation.  I also had discussed with CCM Np Laverle Hobby, MD Stroke Neurology 03/11/2018 10:15 AM    To contact Stroke Continuity provider, please refer to [email or web address] After hours, contact General Neurology

## 2018-03-11 NOTE — Progress Notes (Signed)
Nutrition Follow-up  DOCUMENTATION CODES:   Not applicable  INTERVENTION:  Initiate TF with Vital High Protein at goal rate of 45 ml/h (1080 ml per day) and Prostat 30 ml once daily.  Tube feeding regimen with current propofol rate to provide 1611 kcals, 110 gm protein, 907 ml free water daily.  NUTRITION DIAGNOSIS:   Inadequate oral intake related to inability to eat as evidenced by NPO status; ongoing  GOAL:   Patient will meet greater than or equal to 90% of their needs; progressing with TF  MONITOR:   Vent status, Labs, Weight trends, I & O's  REASON FOR ASSESSMENT:   Ventilator    ASSESSMENT:   73 year old female who presented to the ED on 1/1 as code stroke. PMH significant for stroke, T2DM, syncope, PAD, HTN, HLD, CEA, cardiomyopathy, neuropathy. PT received TPA in the ED. CT showed left MCA acute ischemic stroke and taken for IR chemical thrombectomy.  Patient is currently intubated on ventilator support MV: 12.7 L/min Temp (24hrs), Avg:98.5 F (36.9 C), Min:98.1 F (36.7 C), Max:99.1 F (37.3 C)  Propofol: 16.32 ml/hr which provides 431 kcal/day Cleviprex: off   RD consulted for enteral/tube feeding initiation and management. RD to put in orders.   Labs and medications reviewed.   Diet Order:   Diet Order            Diet NPO time specified  Diet effective now              EDUCATION NEEDS:   Not appropriate for education at this time  Skin:  Skin Assessment: Reviewed RN Assessment(incision to neck)  Last BM:  PTA/unknown  Height:   Ht Readings from Last 1 Encounters:  05/31/17 5\' 5"  (1.651 m)    Weight:   Wt Readings from Last 1 Encounters:  03/12/2018 77.7 kg    Ideal Body Weight:  56.8 kg(calculated using height from 05/31/17)  BMI:  Body mass index is 28.51 kg/m.  Estimated Nutritional Needs:   Kcal:  1585  Protein:  100-115 grams  Fluid:  >/= 1.6 L    Corrin Parker, MS, RD, LDN Pager # [phone] After hours/  weekend pager # [phone]

## 2018-03-12 ENCOUNTER — Inpatient Hospital Stay (HOSPITAL_COMMUNITY): Payer: PPO

## 2018-03-12 DIAGNOSIS — N179 Acute kidney failure, unspecified: Secondary | ICD-10-CM

## 2018-03-12 DIAGNOSIS — I429 Cardiomyopathy, unspecified: Secondary | ICD-10-CM

## 2018-03-12 LAB — POCT I-STAT 3, ART BLOOD GAS (G3+)
Acid-base deficit: 10 mmol/L — ABNORMAL HIGH (ref 0.0–2.0)
Acid-base deficit: 10 mmol/L — ABNORMAL HIGH (ref 0.0–2.0)
Acid-base deficit: 9 mmol/L — ABNORMAL HIGH (ref 0.0–2.0)
Acid-base deficit: 10 mmol/L — ABNORMAL HIGH (ref 0.0–2.0)
Bicarbonate: 14.3 mmol/L — ABNORMAL LOW (ref 20.0–28.0)
Bicarbonate: 15.8 mmol/L — ABNORMAL LOW (ref 20.0–28.0)
Bicarbonate: 13.3 mmol/L — ABNORMAL LOW (ref 20.0–28.0)
Bicarbonate: 13.2 mmol/L — ABNORMAL LOW (ref 20.0–28.0)
O2 SAT: 100 %
O2 Saturation: 100 %
O2 Saturation: 99 %
O2 Saturation: 100 %
PCO2 ART: 32.3 mmHg (ref 32.0–48.0)
Patient temperature: 98.7
Patient temperature: 98.7
Patient temperature: 97.7
TCO2: 15 mmol/L — ABNORMAL LOW (ref 22–32)
TCO2: 17 mmol/L — ABNORMAL LOW (ref 22–32)
TCO2: 14 mmol/L — ABNORMAL LOW (ref 22–32)
TCO2: 14 mmol/L — ABNORMAL LOW (ref 22–32)
pCO2 arterial: 25.6 mmHg — ABNORMAL LOW (ref 32.0–48.0)
pCO2 arterial: 19 mmHg — CL (ref 32.0–48.0)
pCO2 arterial: 21.2 mmHg — ABNORMAL LOW (ref 32.0–48.0)
pH, Arterial: 7.355 (ref 7.350–7.450)
pH, Arterial: 7.298 — ABNORMAL LOW (ref 7.350–7.450)
pH, Arterial: 7.452 — ABNORMAL HIGH (ref 7.350–7.450)
pH, Arterial: 7.401 (ref 7.350–7.450)
pO2, Arterial: 195 mmHg — ABNORMAL HIGH (ref 83.0–108.0)
pO2, Arterial: 137 mmHg — ABNORMAL HIGH (ref 83.0–108.0)
pO2, Arterial: 327 mmHg — ABNORMAL HIGH (ref 83.0–108.0)
pO2, Arterial: 479 mmHg — ABNORMAL HIGH (ref 83.0–108.0)

## 2018-03-12 LAB — BASIC METABOLIC PANEL
ANION GAP: 12 (ref 5–15)
BUN: 83 mg/dL — ABNORMAL HIGH (ref 8–23)
CO2: 15 mmol/L — ABNORMAL LOW (ref 22–32)
Calcium: 7 mg/dL — ABNORMAL LOW (ref 8.9–10.3)
Chloride: 111 mmol/L (ref 98–111)
Creatinine, Ser: 5.26 mg/dL — ABNORMAL HIGH (ref 0.44–1.00)
GFR calc Af Amer: 9 mL/min — ABNORMAL LOW (ref >60)
GFR calc non Af Amer: 8 mL/min — ABNORMAL LOW (ref >60)
Glucose, Bld: 272 mg/dL — ABNORMAL HIGH (ref 70–99)
Potassium: 4.8 mmol/L (ref 3.5–5.1)
Sodium: 138 mmol/L (ref 135–145)

## 2018-03-12 LAB — CBC
HCT: 27.3 % — ABNORMAL LOW (ref 36.0–46.0)
Hemoglobin: 8.9 g/dL — ABNORMAL LOW (ref 12.0–15.0)
MCH: 31.1 pg (ref 26.0–34.0)
MCHC: 32.6 g/dL (ref 30.0–36.0)
MCV: 95.5 fL (ref 80.0–100.0)
Platelets: 106 10*3/uL — ABNORMAL LOW (ref 150–400)
RBC: 2.86 MIL/uL — ABNORMAL LOW (ref 3.87–5.11)
RDW: 13.5 % (ref 11.5–15.5)
WBC: 13.1 10*3/uL — ABNORMAL HIGH (ref 4.0–10.5)
nRBC: 0.5 % — ABNORMAL HIGH (ref 0.0–0.2)

## 2018-03-12 LAB — GLUCOSE, CAPILLARY
GLUCOSE-CAPILLARY: 135 mg/dL — AB (ref 70–99)
Glucose-Capillary: 263 mg/dL — ABNORMAL HIGH (ref 70–99)
Glucose-Capillary: 222 mg/dL — ABNORMAL HIGH (ref 70–99)
Glucose-Capillary: 179 mg/dL — ABNORMAL HIGH (ref 70–99)
Glucose-Capillary: 150 mg/dL — ABNORMAL HIGH (ref 70–99)
Glucose-Capillary: 209 mg/dL — ABNORMAL HIGH (ref 70–99)

## 2018-03-12 LAB — PHOSPHORUS
Phosphorus: 7.5 mg/dL — ABNORMAL HIGH (ref 2.5–4.6)
Phosphorus: 8.2 mg/dL — ABNORMAL HIGH (ref 2.5–4.6)

## 2018-03-12 LAB — MAGNESIUM
Magnesium: 2.3 mg/dL (ref 1.7–2.4)
Magnesium: 2.4 mg/dL (ref 1.7–2.4)

## 2018-03-12 MED ORDER — ETOMIDATE 2 MG/ML IV SOLN
20.0000 mg | Freq: Once | INTRAVENOUS | Status: AC
  Administered 2018-03-12: 20 mg via INTRAVENOUS

## 2018-03-12 MED ORDER — CARVEDILOL 3.125 MG PO TABS
3.1250 mg | ORAL_TABLET | Freq: Two times a day (BID) | ORAL | Status: DC
  Administered 2018-03-13: 3.125 mg
  Filled 2018-03-12: qty 1

## 2018-03-12 MED ORDER — ROCURONIUM BROMIDE 50 MG/5ML IV SOLN
100.0000 mg | Freq: Once | INTRAVENOUS | Status: AC
  Administered 2018-03-12: 100 mg via INTRAVENOUS

## 2018-03-12 NOTE — Progress Notes (Signed)
Ventilator alarm was going off. When Yvonne Malta RN came into the room the Yvonne Patel had self extubated with ETT tube on her chest. Yvonne Patel's RN notified and is at bedside. RT notified. Nasal cannula at 2L initiated then switched to Bi Pap. Yvonne Patel O2 sats at 100% on BiPap.  Dr Oletta Darter notified at [place]. Orders received. RN to continue to monitor closely.

## 2018-03-12 NOTE — Procedures (Signed)
Intubation Procedure Note Yvonne Patel [id number] [birth year]  Procedure: Intubation Indications: Airway protection and maintenance  Procedure Details Consent: Risks of procedure as well as the alternatives and risks of each were explained to the (patient/caregiver).  Consent for procedure obtained. Time Out: Verified patient identification, verified procedure, site/side was marked, verified correct patient position, special equipment/implants available, medications/allergies/relevent history reviewed, required imaging and test results available.  Performed  Maximum sterile technique was used including hand hygiene.  Miller and 2    Evaluation Hemodynamic Status: BP stable throughout; O2 sats: stable throughout Patient's Current Condition: stable Complications: No apparent complications Patient did tolerate procedure well. Chest X-ray ordered to verify placement.  CXR: pending.   Yvonne Patel 03/12/2018

## 2018-03-12 NOTE — Progress Notes (Signed)
Called by RN regarding low BP.  BP range 395'V systolic.  Coreg dosing adjusted this am to 6.2.  Will hold PM dose of beta blocker and resume 3.125 (home dosing in am).  Avoid relative hypotension. Concern for possible episodes of desaturation but pleth is dampened when readings occur and spontaneously resolve.    Plan: Increase PEEP 5, wean FiO2 Assess ABG now > reviewed, appropriate respiratory compensation for metabolic acidosis (expected bicarb 11.5-15.5) Adjust coreg as above > SBP goal 120-160   Noe Gens, NP-C [facility] Pgr: [phone] or if no answer [phone] 03/12/2018, 11:28 AM

## 2018-03-12 NOTE — Procedures (Signed)
Extubation Procedure Note  Patient Details:   Name: Yvonne Patel DOB: 1945/08/16 MRN: [id number]   Airway Documentation:    Vent end date: 03/12/17 Vent end time: 0100   Evaluation  O2 sats: stable throughout Complications: No apparent complications Patient did tolerate procedure well. Bilateral Breath Sounds: Rhonchi   Yes  [name] 03/12/2018, 1:17 AM

## 2018-03-12 NOTE — Progress Notes (Signed)
Patient switched to nasal cannula at 0130 after ABG reviewed with CCM on site. Patient's O2 sats at 100%. Patient still unable to cough on demand but is following commands intermittently which is her baseline prior to self extubation. RN to continue to monitor.

## 2018-03-12 NOTE — Progress Notes (Signed)
Pt less responsive, bradycardic 40s-50s, and hypotensive with MAPs in the 50s. Dr. Leonie Man and Noe Gens, NP made aware. Brandi called to bedside.

## 2018-03-12 NOTE — Progress Notes (Signed)
Patient self extubated and was placed on BIPAP for support until further decisions could be made.Patient isn't able to cough on command, but holding O2 sats well and HR 65, Sats 100%, RR 26 on and off BIPAP.

## 2018-03-12 NOTE — Consult Note (Signed)
[facility]    NEPHROLOGY CONSULTATION NOTE  PATIENT ID:  Yvonne Patel, DOB:  01/31/1946  HPI: The patient is a 73 y.o. year old female with a past medical history significant for stroke, diabetes, syncope, peripheral arterial disease, hypertension, hyperlipidemia, CEA, cardiomyopathy and neuropathy who presented after she was noted to have a sudden right facial droop, inability to move her right arm, and aphasia.  She was brought immediately to the [facility] emergency department as a code stroke.  She was given TPA in the emergency department for an acute left MCA stroke.  She underwent a subsequent mechanical thrombectomy.  She remained intubated on the ventilator with no real significant improvement in mental status.  She self extubated last night, and was reintubated this morning.  Review of prior records reveals a baseline creatinine of 1.5 to 1.8 mg/dL last year, likely secondary to underlying diabetes and hypertension.  She has had no significant proteinuria.  On admission, she was noted to have a serum creatinine of 2.2, which has worsened to 5.26.  She had about 330 mL of urine output yesterday.  She was notably in the 26S systolic this morning.  Renal consultation has been called for worsening renal function.  Past Medical History:  Diagnosis Date  . Cardiomyopathy ([place])   . Coronary artery disease   . Hyperlipidemia   . Hypertension   . Neuromuscular disorder (HCC)    neuropathy  . PAD (peripheral artery disease) ([place])   . Peripheral vascular disease ([place])   . Refusal of blood transfusions as patient is Jehovah's Witness   . Retinal disease   . Syncope   . TIA (transient ischemic attack) 2010; 09/2011   denies residual on 03/11/2015  . Type II diabetes mellitus ([place])     Past Surgical History:  Procedure Laterality Date  . AORTIC ARCH ANGIOGRAPHY N/A 06/22/2016   Procedure: Aortic Arch Angiography;  Surgeon: [name], MD;  Location: [place] [facility];   Service: Cardiovascular;  Laterality: N/A;  . CAROTID ANGIOGRAPHY N/A 06/22/2016   Procedure: Carotid Angiography;  Surgeon: [name], MD;  Location: [place] [facility];  Service: Cardiovascular;  Laterality: N/A;  . DILATION AND CURETTAGE OF UTERUS    . ENDARTERECTOMY Left 07/14/2016   Procedure: ENDARTERECTOMY CAROTID LEFT;  Surgeon: Elam Dutch, MD;  Location: [place];  Service: Vascular;  Laterality: Left;  . FEMORAL ARTERY STENT    . IR ANGIOGRAM EXTREMITY BILATERAL  04/07/2018  . IR PERCUTANEOUS ART THROMBECTOMY/INFUSION INTRACRANIAL INC DIAG ANGIO  04/03/2018  . IR US GUIDE VASC ACCESS RIGHT  03/26/2018  . LEFT HEART CATH AND CORONARY ANGIOGRAPHY N/A 06/22/2016   Procedure: Left Heart Cath and Coronary Angiography;  Surgeon: [name], MD;  Location: [place] [facility];  Service: Cardiovascular;  Laterality: N/A;  . LOWER EXTREMITY ANGIOGRAM N/A 06/05/2012   Procedure: LOWER EXTREMITY ANGIOGRAM;  Surgeon: [name], MD;  Location: [facility] CATH LAB;  Service: Cardiovascular;  Laterality: N/A;  . LOWER EXTREMITY ANGIOGRAM N/A 07/11/2012   Procedure: LOWER EXTREMITY ANGIOGRAM;  Surgeon: [name], MD;  Location: [facility] CATH LAB;  Service: Cardiovascular;  Laterality: N/A;  . LOWER EXTREMITY ANGIOGRAM N/A 09/19/2012   Procedure: LOWER EXTREMITY ANGIOGRAM;  Surgeon: [name], MD;  Location: [facility] CATH LAB;  Service: Cardiovascular;  Laterality: N/A;  . PATCH ANGIOPLASTY Left 07/14/2016   Procedure: PATCH ANGIOPLASTY;  Surgeon: Elam Dutch, MD;  Location: [place];  Service: Vascular;  Laterality: Left;  .  PERIPHERAL VASCULAR CATHETERIZATION N/A 03/11/2015   Procedure: Renal Angiography;  Surgeon: [name], MD;  Location: [place] [facility];  Service: Cardiovascular;  Laterality: N/A;  . RADIOLOGY WITH ANESTHESIA N/A 03/22/2018   Procedure: IR WITH ANESTHESIA;  Surgeon: Radiologist, Medication, MD;  Location: [place];  Service: Radiology;  Laterality: N/A;  . RENAL ANGIOGRAM Left 06/05/2012    Procedure: RENAL ANGIOGRAM;  Surgeon: [name], MD;  Location: [facility] CATH LAB;  Service: Cardiovascular;  Laterality: Left;  . RENAL ANGIOGRAPHY  06/22/2016   Procedure: Renal Angiography;  Surgeon: [name], MD;  Location: [place] [facility];  Service: Cardiovascular;;  . RENAL ARTERY ANGIOPLASTY Right 03/11/2015  . TUBAL LIGATION    . VAGINAL HYSTERECTOMY      Family History  Problem Relation Age of Onset  . Breast cancer Neg Hx     Social History   Tobacco Use  . Smoking status: Former Smoker    Packs/day: 0.50    Years: 30.00    Pack years: 15.00    Types: Cigarettes    Start date: 03/28/1965    Last attempt to quit: 03/31/1975    Years since quitting: 42.9  . Smokeless tobacco: Never Used  Substance Use Topics  . Alcohol use: Yes    Comment: 03/11/2015 'drink at gatherings a couple times/year"  . Drug use: No    REVIEW OF SYSTEMS: Unable to obtain secondary to recent stroke.    PHYSICAL EXAM:  Vitals:   03/12/18 1115 03/12/18 1200  BP:    Pulse: (!) 56 (!) 49  Resp: (!) 29 (!) 27  Temp:  97.7 F (36.5 C)  SpO2: (!) 87% 100%   I/O last 3 completed shifts: In: 3031.5 [I.V.:2731.5; NG/GT:300] Out: 430 [Urine:430] 103/39, heart rate 43, respiratory rate 27, temperature 97.7  General: Does not respond to questions.  Opens eyes, does not have purposeful interaction HEENT: MMM [place] AT anicteric sclera Neck:  No JVD, no adenopathy CV:  Heart RRR  Lungs:  L/S CTA bilaterally Abd:  abd SNT/ND with normal BS GU:  Bladder non-palpable Extremities:  No LE edema. Skin:  No skin rash Psych:  normal mood and affect Neuro: Right upper and lower extremity without spontaneous movement.  MEDICATIONS:   Current Facility-Administered Medications:  .   stroke: mapping our early stages of recovery book, , Does not apply, Once, Marliss Coots, PA-C .  0.9 %  sodium chloride infusion, , Intravenous, Continuous, Marliss Coots, PA-C, Last Rate: 75 mL/hr at 03/12/18  1000 .  acetaminophen (TYLENOL) tablet 650 mg, 650 mg, Oral, Q4H PRN **OR** acetaminophen (TYLENOL) solution 650 mg, 650 mg, Per Tube, Q4H PRN **OR** acetaminophen (TYLENOL) suppository 650 mg, 650 mg, Rectal, Q4H PRN, Marliss Coots, PA-C .  aspirin EC tablet 81 mg, 81 mg, Oral, QHS, Rosalin Hawking, MD, 81 mg at 03/11/18 2128 .  atorvastatin (LIPITOR) tablet 80 mg, 80 mg, Oral, q1800, Patwardhan, Manish J, MD, 80 mg at 03/11/18 1708 .  [START ON 03/13/2018] carvedilol (COREG) tablet 3.125 mg, 3.125 mg, Per Tube, BID WC, Ollis, Brandi L, NP .  chlorhexidine gluconate (MEDLINE KIT) (PERIDEX) 0.12 % solution 15 mL, 15 mL, Mouth Rinse, BID, Amie Portland, MD, 15 mL at 03/12/18 0800 .  clevidipine (CLEVIPREX) infusion 0.5 mg/mL, 0-21 mg/hr, Intravenous, Continuous, Corrie Mckusick, DO, Stopped at 03/10/18 0414 .  clopidogrel (PLAVIX) tablet 75 mg, 75 mg, Oral, QHS, Rosalin Hawking, MD, 75 mg at 03/11/18 2129 .  feeding supplement (PRO-STAT  SUGAR FREE 64) liquid 30 mL, 30 mL, Per Tube, Daily, Rosalin Hawking, MD, 30 mL at 03/12/18 1236 .  feeding supplement (VITAL HIGH PROTEIN) liquid 1,000 mL, 1,000 mL, Per Tube, Q24H, Rosalin Hawking, MD .  fentaNYL (SUBLIMAZE) injection 50 mcg, 50 mcg, Intravenous, Q2H PRN, Jennelle Human B, NP, 50 mcg at 03/11/18 2039 .  Ferrous Fumarate (HEMOCYTE - 106 mg FE) tablet 106 mg of iron, 1 tablet, Oral, BID WC, Rosalin Hawking, MD, 106 mg of iron at 03/12/18 0834 .  hydrALAZINE (APRESOLINE) injection 10 mg, 10 mg, Intravenous, Q6H PRN, Leonie Man, Pramod S, MD .  insulin aspart (novoLOG) injection 0-9 Units, 0-9 Units, Subcutaneous, Q4H, Rosalin Hawking, MD, 2 Units at 03/12/18 1237 .  iohexol (OMNIPAQUE) 300 MG/ML solution 96 mL, 96 mL, Intravenous, Once PRN, Corrie Mckusick, DO .  labetalol (NORMODYNE,TRANDATE) injection 20 mg, 20 mg, Intravenous, Q2H PRN, Garvin Fila, MD .  MEDLINE mouth rinse, 15 mL, Mouth Rinse, 10 times per day, Amie Portland, MD, 15 mL at 03/12/18 1237 .  pantoprazole  (PROTONIX) injection 40 mg, 40 mg, Intravenous, QHS, Marliss Coots, PA-C, 40 mg at 03/11/18 2129 .  senna-docusate (Senokot-S) tablet 1 tablet, 1 tablet, Oral, QHS PRN, Marliss Coots, PA-C     LABS:  CBC Latest Ref Rng & Units 03/12/2018 03/11/2018 03/10/2018  WBC 4.0 - 10.5 K/uL 13.1(H) 12.1(H) 8.4  Hemoglobin 12.0 - 15.0 g/dL 8.9(L) 9.3(L) 9.0(L)  Hematocrit 36.0 - 46.0 % 27.3(L) 28.9(L) 28.8(L)  Platelets 150 - 400 K/uL 106(L) 116(L) 145(L)    CMP Latest Ref Rng & Units 03/12/2018 03/11/2018 03/10/2018  Glucose 70 - 99 mg/dL 272(H) 188(H) 155(H)  BUN 8 - 23 mg/dL 83(H) 56(H) 42(H)  Creatinine 0.44 - 1.00 mg/dL 5.26(H) 4.68(H) 4.27(H)  Sodium 135 - 145 mmol/L 138 138 140  Potassium 3.5 - 5.1 mmol/L 4.8 4.7 5.1  Chloride 98 - 111 mmol/L 111 110 113(H)  CO2 22 - 32 mmol/L 15(L) 13(L) 15(L)  Calcium 8.9 - 10.3 mg/dL 7.0(L) 7.6(L) 8.1(L)  Total Protein 6.5 - 8.1 g/dL - - -  Total Bilirubin 0.3 - 1.2 mg/dL - - -  Alkaline Phos 38 - 126 U/L - - -  AST 15 - 41 U/L - - -  ALT 0 - 44 U/L - - -    Lab Results  Component Value Date   CALCIUM 7.0 (L) 03/12/2018   CAION 1.01 (L) 04/06/2018   PHOS 7.5 (H) 03/12/2018       Component Value Date/Time   COLORURINE YELLOW 07/21/2016 2243   APPEARANCEUR HAZY (A) 07/21/2016 2243   LABSPEC 1.011 07/21/2016 2243   PHURINE 6.0 07/21/2016 2243   GLUCOSEU NEGATIVE 07/21/2016 2243   HGBUR SMALL (A) 07/21/2016 2243   HGBUR trace-intact 12/18/2009 0836   BILIRUBINUR NEGATIVE 07/21/2016 2243   KETONESUR NEGATIVE 07/21/2016 2243   PROTEINUR 100 (A) 07/21/2016 2243   UROBILINOGEN 0.2 01/22/2012 1412   NITRITE NEGATIVE 07/21/2016 2243   LEUKOCYTESUR NEGATIVE 07/21/2016 2243      Component Value Date/Time   PHART 7.401 03/12/2018 1403   PCO2ART 21.2 (L) 03/12/2018 1403   PO2ART 479.0 (H) 03/12/2018 1403   HCO3 13.2 (L) 03/12/2018 1403   TCO2 14 (L) 03/12/2018 1403   ACIDBASEDEF 10.0 (H) 03/12/2018 1403   O2SAT 100.0 03/12/2018 1403       Component  Value Date/Time   IRON 18 (L) 07/18/2016 0948   TIBC 207 (L) 07/18/2016 0948   FERRITIN 67 07/18/2016 0948  IRONPCTSAT 9 (L) 07/18/2016 0948       ASSESSMENT/PLAN:     Problem List Items Addressed This Visit      Cardiovascular and Mediastinum   CVA (cerebral vascular accident) ([place]) - Primary   Relevant Medications   clevidipine (CLEVIPREX) 0.5 MG/ML infusion (Completed)   clevidipine (CLEVIPREX) infusion 0.5 mg/mL   atorvastatin (LIPITOR) tablet 80 mg   furosemide (LASIX) injection 40 mg (Completed)   aspirin EC tablet 81 mg   labetalol (NORMODYNE,TRANDATE) injection 10 mg (Completed)   labetalol (NORMODYNE,TRANDATE) injection 20 mg   hydrALAZINE (APRESOLINE) injection 10 mg   carvedilol (COREG) tablet 3.125 mg (Start on 03/13/2018  8:00 AM)   Other Relevant Orders   IR Angiogram Extremity Bilateral (Completed)     Respiratory   Acute respiratory failure (HCC)   Relevant Orders   DG Chest Port 1 View (Completed)   DG Chest Port 1 View (Completed)   DG Chest Port 1 View     Other   Endotracheal tube present   Relevant Orders   Portable Chest x-ray (Completed)    Other Visit Diagnoses    Stroke (HCC)       Relevant Medications   clevidipine (CLEVIPREX) 0.5 MG/ML infusion (Completed)   clevidipine (CLEVIPREX) infusion 0.5 mg/mL   atorvastatin (LIPITOR) tablet 80 mg   furosemide (LASIX) injection 40 mg (Completed)   aspirin EC tablet 81 mg   labetalol (NORMODYNE,TRANDATE) injection 10 mg (Completed)   labetalol (NORMODYNE,TRANDATE) injection 20 mg   hydrALAZINE (APRESOLINE) injection 10 mg   carvedilol (COREG) tablet 3.125 mg (Start on 03/13/2018  8:00 AM)   Other Relevant Orders   IR PERCUTANEOUS ART THROMBECTOMY/INFUSION INTRACRANIAL INC DIAG ANGIO (Completed)   IR US Guide Vasc Access Right (Completed)   IR Angiogram Extremity Bilateral (Completed)   IR Angiogram Extremity Bilateral (Completed)   Encounter for intubation       Relevant Orders   DG CHEST PORT 1  VIEW (Completed)   Encounter for orogastric (OG) tube placement       Relevant Orders   DG Abd 1 View (Completed)      1.  Chronic kidney disease stage IV with a baseline serum creatinine around 1.5 to 1.8 mg/dL.  I suspect this is on the basis of longstanding hypertension and diabetes.  2.  Acute kidney injury.  Likely on the basis of hemodynamics in the setting of an acute stroke, hypotension, and self extubation.  Attempt to avoid hypotension.  Check urinalysis for completeness.  Remains oliguric.  Would monitor for today.  Given underlying neurologic status, I doubt escalation of care to include dialysis would improve her morbidity or mortality.  Will address again in the morning.  Has not seen any significant neurologic improvement.  Volume status and electrolytes are stable.  3.  Hypertension.  Transitioning to oral medications in the morning.  4.  Acute left MCA CVA.[place] Kitchen  No clear evidence of neurologic improvement.  Suspect palliative care may be the most appropriate route.  5.  Systolic congestive heart failure.  Continue goal-directed therapy for now, but keep ACE inhibitor on hold in the setting of acute kidney injury.   [place], DO, [place]

## 2018-03-12 NOTE — Progress Notes (Signed)
NAME:  Yvonne Patel, MRN:  [id number], DOB:  [birth year], LOS: 4 ADMISSION DATE:  04/07/2018, CONSULTATION DATE:  1/1 REFERRING MD:  Dr. Rory Percy , CHIEF COMPLAINT:  CVA   Brief History   73 year old female admitted with acute L MCA CVA s/p systemic TPA and mechanical thrombolysis.   Past Medical History  DM II TIA  [employer]ehovah's witness > no blood products  HTN HLD CAD Cardiomyopathy  Significant Hospital Events   1/01 admit 1/03 CP, + troponin, Cardiology consulted.  Cleviprex, propofol.   1/04 Self extubated, reintubated for poor cough/airway protection  Consults:  Cardiology  Procedures:  VIR 1/1 >> procedure completed for left MCA occlusion at M1 segment. The procedure involved two passes of combination mechanical and aspiration thrombectomy with restoration of TICI 2b flow. L femoral art line 1/1 >> ETT 1/1 >>   Significant Diagnostic Tests:  CT head 1/1 > no acute finding, multiple remote small vessel infarcts CTA head/neck 1/1 > Left M2 branch occlusion beginning at the M1 bifurcation. Severe atherosclerosis which preferentially spares the left carotid circulation, question prior endarterectomy. Severe atheromatous narrowing if not occlusion of bilateral proximal subclavian arteries. There is bilateral fetal type PCA with diffuse thready flow in the posterior fossa. 70% atheromatous narrowing of the right common carotid and ICA bulb. CT head 1/1 (post IR) > New 3 mm high-density within the posterior right frontal cortex which could be focus of hemorrhage or an enhancing subacute infarct. No hemorrhage or infarct seen along the postoperative left MCA distribution. TTE 1/1 >> LVEF 30-35%, mod global & severe inferior / inferolateral hypokinesis,  MRI Brain 1/2 >> moderate left MCA infarct, punctate acute infarcts involving the left caudate, right centrum semiovale, parasagittal right parietal cortex, bilateral cerebellar hemispheres.  4 mm hemorrhage right precentral gyrus.   Numerous microhemorrhages present basal ganglia both cerebral hemispheres, cerebellum. Advanced intracranial atherosclerosis, moderate ICA stenoses, chronic segmental occlusion basilar artery with decreased bilateral vertebral artery flow  Micro Data:  1/1 MRSA PCR  >> neg  Antimicrobials:  1/1 ancef preop  Interim history/subjective:  Pt self extubated overnight.  Re-intubated due to poor cough mechanics/airway protection.    Objective   Blood pressure (!) 180/51, pulse 73, temperature 98.1 F (36.7 C), temperature source Axillary, resp. rate 12, weight 88.2 kg, SpO2 100 %.    Vent Mode: PRVC FiO2 (%):  [30 %-40 %] 40 % Set Rate:  [16 bmp] 16 bmp Vt Set:  [450 mL] 450 mL PEEP:  [5 cmH20] 5 cmH20 Pressure Support:  [12 cmH20] 12 cmH20 Plateau Pressure:  [13 cmH20-18 cmH20] 18 cmH20   Intake/Output Summary (Last 24 hours) at 03/12/2018 0915 Last data filed at 03/12/2018 0700 Gross per 24 hour  Intake 2103.43 ml  Output 330 ml  Net 1773.43 ml   Filed Weights   03/21/2018 1600 03/12/18 0449  Weight: 77.7 kg 88.2 kg    Examination: General: elderly female lying in bed on vent in NAD HEENT: MM pink/moist, ETT Neuro: opens eyes but does not track, wiggles toes on left to command CV: s1s2 rrr, no m/r/g PULM: even/non-labored, lungs bilaterally clear  QQ:IWLN, non-tender, bsx4 active  Extremities: warm/dry, trace to 1+ generalized edema  Skin: no rashes or lesions  Resolved Hospital Problem list     Assessment & Plan:   Acute CVA:  Left M1/M2 distribution. S/p systemic TPA 1/1 at 1700, and Neuro IR thrombectomy.  Associated hemorrhage. P: ICU monitoring  Recommendations per Neurology  SBP Goal 120-160  Minimize sedation as able  IR following   Acute respiratory insufficency related to above  -failed self extubation 1/5 P: PRVC 8 cc/kg as rest mode OK for PSV wean but no extubation 1/5  Follow CXR   Hypertension PAD -difficult to monitor BP due to severe subclavian  stenosis P: Monitor BP via Aline  Coreg 3.125 BID > avoid relative hypotension with CVA, AKI.  Dose reduced from 6 back to 3.125  Chest pain NSTEMI - troponin  peaked Systolic HF - TTE 1/1 with EF 30-35% P: Cardiology following > rec's for no further work up at this time  ASA, lipitor  BP goals as above   Acute on chronic oliguric renal failure, suspect some component of hypoperfusion, dye load P: Nephrology consulted   Trend BMP / urinary output Replace electrolytes as indicated Avoid nephrotoxic agents, ensure adequate renal perfusion NS at 47ml/hr   DM2 P: SSI, sensitive scale     Best practice:  Diet: NPO Pain/Anxiety/Delirium protocol (if indicated): propofol RASS goal 0 to -1 VAP protocol (if indicated): per protocol DVT prophylaxis: SCD GI prophylaxis: protonix Glucose control: ssi Mobility: BR Code Status: Full Family Communication: Family updated at bedside 1/5.  They are taking each decision a step at a time.  They are hopeful for a good quality of life for her. Have discussed concept of trach, HD etc.  They are not sure they would want those long term for her.  Disposition: ICU  Labs   CBC: Recent Labs  Lab 03/30/2018 1656 03/09/18 1040 03/10/18 0503 03/11/18 0535 03/12/18 0453  WBC 6.3 6.6 8.4 12.1* 13.1*  NEUTROABS 3.2  --   --   --   --   HGB 12.2 9.5* 9.0* 9.3* 8.9*  HCT 38.5 30.1* 28.8* 28.9* 27.3*  MCV 94.1 95.3 96.0 95.4 95.5  PLT 174 150 145* 116* 106*    Basic Metabolic Panel: Recent Labs  Lab 03/09/18 1055 03/10/18 0503 03/10/18 1725 03/11/18 0535 03/11/18 1245 03/11/18 1700 03/12/18 0453  NA 139 141 140 138  --   --  138  K 3.6 4.1 5.1 4.7  --   --  4.8  CL 107 110 113* 110  --   --  111  CO2 21* 20* 15* 13*  --   --  15*  GLUCOSE 174* 171* 155* 188*  --   --  272*  BUN 25* 32* 42* 56*  --   --  83*  CREATININE 2.06* 2.97* 4.27* 4.68*  --   --  5.26*  CALCIUM 8.1* 8.1* 8.1* 7.6*  --   --  7.0*  MG  --  2.1  --  2.3 2.2 2.2  2.3  PHOS 5.4* 6.0*  --  9.1* 8.2* 8.1* 7.5*   GFR: CrCl cannot be calculated (Unknown ideal weight.). Recent Labs  Lab 03/09/18 1040 03/10/18 0503 03/11/18 0535 03/12/18 0453  WBC 6.6 8.4 12.1* 13.1*    Liver Function Tests: Recent Labs  Lab 04/07/2018 1656 03/09/18 1055 03/10/18 0503  AST 27  --   --   ALT 26  --   --   ALKPHOS 97  --   --   BILITOT 0.7  --   --   PROT 7.4  --   --   ALBUMIN 3.0* 2.1* 2.1*   No results for input(s): LIPASE, AMYLASE in the last 168 hours. No results for input(s): AMMONIA in the last 168 hours.  ABG    Component Value Date/Time  PHART 7.298 (L) 03/12/2018 0644   PCO2ART 32.3 03/12/2018 0644   PO2ART 137.0 (H) 03/12/2018 0644   HCO3 15.8 (L) 03/12/2018 0644   TCO2 17 (L) 03/12/2018 0644   ACIDBASEDEF 10.0 (H) 03/12/2018 0644   O2SAT 99.0 03/12/2018 0644     Coagulation Profile: Recent Labs  Lab 03/20/2018 1656  INR 1.00    Cardiac Enzymes: Recent Labs  Lab 03/09/18 0019 03/09/18 0432 03/09/18 1040 03/09/18 1526 03/09/18 2224  TROPONINI 6.47* 5.08* 4.70* 3.81* 3.88*    HbA1C: Hgb A1c MFr Bld  Date/Time Value Ref Range Status  03/09/2018 04:32 AM 7.1 (H) 4.8 - 5.6 % Final    Comment:    (NOTE) Pre diabetes:          5.7%-6.4% Diabetes:              >6.4% Glycemic control for   <7.0% adults with diabetes   05/31/2017 03:59 PM 6.7 (H) 4.8 - 5.6 % Final    Comment:    (NOTE) Pre diabetes:          5.7%-6.4% Diabetes:              >6.4% Glycemic control for   <7.0% adults with diabetes     CBG: Recent Labs  Lab 03/11/18 1601 03/11/18 1920 03/11/18 2336 03/12/18 0352 03/12/18 0816  GLUCAP 182* 199* 242* 263* 222*    Critical care time:  30 mins    Noe Gens, NP-C [facility] Pgr: [phone] or if no answer [phone] 03/12/2018, 9:15 AM

## 2018-03-12 NOTE — Progress Notes (Signed)
Pt had a rhythm change with a HR of 26, EKG obtained. RN at bedside with Noe Gens, NP & Dr. Valeta Harms. See new orders.

## 2018-03-12 NOTE — Progress Notes (Addendum)
[facility] KitchenSTROKE TEAM PROGRESS NOTE   SUBJECTIVE (INTERVAL HISTORY) .She self extubated yesterday and went into respiratory distress requiring reintubation and sedation.  Her exam today remains poor. She is not responding, has decrease UOP, and is bradycardia HR 40's-50's. Her sequelae of medical complications or cardiomyopathy, and renal impairment. Nephrology and cardiology are on board. We have further discussed our input at this time from a neurological standpoint with her daughter Reuben Likes. Apparently she has 2 children that reside in [place], and other are visiting from out of town. They seem to be onboard with expectations of quality of life. At this point we will continue to monitor for  any neurological imrovments or decline.  Her renal function continues to decline and serum creatinine today is up at 5.26.  Nephrology has been consulted. OBJECTIVE Temp:  [97.7 F (36.5 C)-98.5 F (36.9 C)] 97.7 F (36.5 C) (01/05 1200) Pulse Rate:  [49-76] 63 (01/05 1519) Cardiac Rhythm: Sinus bradycardia (01/05 1200) Resp:  [11-30] 26 (01/05 1519) BP: (103-154)/(39-62) 139/62 (01/05 1519) SpO2:  [80 %-100 %] 100 % (01/05 1519) Arterial Line BP: (99-165)/(38-62) 137/62 (01/05 1500) FiO2 (%):  [30 %-50 %] 50 % (01/05 1519) Weight:  [88.2 kg] 88.2 kg (01/05 0449)  Recent Labs  Lab 03/11/18 2336 03/12/18 0352 03/12/18 0816 03/12/18 1209 03/12/18 1606  GLUCAP 242* 263* 222* 179* 135*   Recent Labs  Lab 03/09/18 1055 03/10/18 0503 03/10/18 1725 03/11/18 0535 03/11/18 1245 03/11/18 1700 03/12/18 0453 03/12/18 1615  NA 139 141 140 138  --   --  138  --   K 3.6 4.1 5.1 4.7  --   --  4.8  --   CL 107 110 113* 110  --   --  111  --   CO2 21* 20* 15* 13*  --   --  15*  --   GLUCOSE 174* 171* 155* 188*  --   --  272*  --   BUN 25* 32* 42* 56*  --   --  83*  --   CREATININE 2.06* 2.97* 4.27* 4.68*  --   --  5.26*  --   CALCIUM 8.1* 8.1* 8.1* 7.6*  --   --  7.0*  --   MG  --  2.1  --  2.3 2.2 2.2  2.3 2.4  PHOS 5.4* 6.0*  --  9.1* 8.2* 8.1* 7.5* 8.2*   Recent Labs  Lab 03/13/2018 1656 03/09/18 1055 03/10/18 0503  AST 27  --   --   ALT 26  --   --   ALKPHOS 97  --   --   BILITOT 0.7  --   --   PROT 7.4  --   --   ALBUMIN 3.0* 2.1* 2.1*   Recent Labs  Lab 03/30/2018 1656 03/09/18 1040 03/10/18 0503 03/11/18 0535 03/12/18 0453  WBC 6.3 6.6 8.4 12.1* 13.1*  NEUTROABS 3.2  --   --   --   --   HGB 12.2 9.5* 9.0* 9.3* 8.9*  HCT 38.5 30.1* 28.8* 28.9* 27.3*  MCV 94.1 95.3 96.0 95.4 95.5  PLT 174 150 145* 116* 106*   Recent Labs  Lab 03/09/18 0019 03/09/18 0432 03/09/18 1040 03/09/18 1526 03/09/18 2224  TROPONINI 6.47* 5.08* 4.70* 3.81* 3.88*   No results for input(s): LABPROT, INR in the last 72 hours. No results for input(s): COLORURINE, LABSPEC, [place], GLUCOSEU, HGBUR, BILIRUBINUR, KETONESUR, PROTEINUR, UROBILINOGEN, NITRITE, LEUKOCYTESUR in the last 72 hours.  Invalid input(s): APPERANCEUR  Component Value Date/Time   CHOL 141 03/09/2018 0432   TRIG 120 03/09/2018 0432   HDL 25 (L) 03/09/2018 0432   CHOLHDL 5.6 03/09/2018 0432   VLDL 24 03/09/2018 0432   LDLCALC 92 03/09/2018 0432   Lab Results  Component Value Date   HGBA1C 7.1 (H) 03/09/2018   No results found for: LABOPIA, COCAINSCRNUR, LABBENZ, AMPHETMU, THCU, LABBARB  No results for input(s): ETH in the last 168 hours.  I have personally reviewed the radiological images below and agree with the radiology interpretations.  Ct Angio Head W Or Wo Contrast  Result Date: 03/09/2018 CLINICAL DATA:  Code stroke, deficit not specified EXAM: CT ANGIOGRAPHY HEAD AND NECK TECHNIQUE: Multidetector CT imaging of the head and neck was performed using the standard protocol during bolus administration of intravenous contrast. Multiplanar CT image reconstructions and MIPs were obtained to evaluate the vascular anatomy. Carotid stenosis measurements (when applicable) are obtained utilizing NASCET criteria, using the  distal internal carotid diameter as the denominator. CONTRAST:  26mL ISOVUE-370 IOPAMIDOL (ISOVUE-370) INJECTION 76% COMPARISON:  None. FINDINGS: CTA NECK FINDINGS Aortic arch: Extensive atherosclerotic calcification. Three vessel branching Right carotid system: Diffuse mainly calcified atherosclerotic plaque which limits visualization of the lumen due to plaque blooming. There is distal common carotid stenosis that measures up to 70% (when compared to the more proximal vessel given the immediate downstream bifurcation). There is ICA bulb plaque with 70% stenosis. Left carotid system: Much less extensive atherosclerotic plaque, question prior carotid endarterectomy. No stenosis or ulceration. Vertebral arteries: Severe bilateral proximal subclavian stenosis, with possible occlusion on the right. Narrowing on the left involves and extensive segment. No flow is seen within vertebral arteries until the V3 segments, there may be retrograde flow Skeleton: Diffuse degenerative disease.  No acute finding Other neck: Bilateral cataract resection. Upper chest: Hazy density of the lungs which could be from atelectasis. No Kerley lines to implicate edema. Review of the MIP images confirms the above findings CTA HEAD FINDINGS Anterior circulation: Confluent atherosclerotic calcification on the carotid siphons with small vessel size and plaque blooming limiting luminal visualization. There is a left M2 branch occlusion beginning at the M1 bifurcation with subsequent downstream reconstitution. No contralateral embolism is seen. Posterior circulation: Tiny vertebrobasilar arteries. There may be a basilar interruption from atherosclerosis or developmental variant. Fetal type flow to both posterior cerebral arteries which are symmetrically patent Venous sinuses: Patent as permitted by contrast timing Anatomic variants: As above Delayed phase: Not obtained in the emergent setting Review of the MIP images confirms the above findings  Case discussed with Dr. Rory Percy at 5:04 p.m. IMPRESSION: 1. Left M2 branch occlusion beginning at the M1 bifurcation. 2. Severe atherosclerosis which preferentially spares the left carotid circulation, question prior endarterectomy. 3. Severe atheromatous narrowing if not occlusion of bilateral proximal subclavian arteries. There is bilateral fetal type PCA with diffuse thready flow in the posterior fossa. 4. 70% atheromatous narrowing of the right common carotid and ICA bulb. Electronically Signed   By: Monte Fantasia M.D.   On: 04/04/2018 17:16   Dg Abd 1 View  Result Date: 03/12/2018 CLINICAL DATA:  Orogastric tube placement.  Stroke. EXAM: ABDOMEN - 1 VIEW COMPARISON:  None. FINDINGS: Gastric decompression catheter extends into the stomach with the tip likely in the distal stomach near the antrum. No evidence of overt bowel obstruction or ileus. IMPRESSION: Orogastric tube extends into the stomach with the tip located in the distal stomach, likely near the antrum. Electronically Signed   By: Eulas Post  Kathlene Cote M.D.   On: 03/12/2018 08:43   Ct Head Wo Contrast  Result Date: 03/11/2018 CLINICAL DATA:  Follow-up examination for acute stroke. EXAM: CT HEAD WITHOUT CONTRAST TECHNIQUE: Contiguous axial images were obtained from the base of the skull through the vertex without intravenous contrast. COMPARISON:  Comparison made with prior MRI from 03/09/2018 as well as CT from 03/24/2018. FINDINGS: Brain: There has been continued interval evolution of small to moderate sized left MCA territory infarct, overall stable in distribution as compared to previous MRI. Mild localized edema without significant mass effect. Additional involving subcentimeter left cerebellar infarct noted as well. No evidence for hemorrhagic transformation or other complication. Previously noted additional punctate subcentimeter infarcts not well seen by CT. Subcentimeter hyperdensity involving the right frontal cortex slightly increased in size  on today's exam measuring 5 mm, consistent with previously identified small hemorrhage. No significant mass effect or edema. No other evidence for new or interval infarction. No other acute large vessel territory infarct. Underlying atrophy with chronic microvascular ischemic disease noted, stable. Vascular: No hyperdense vessel. Calcified atherosclerosis noted at the skull base. Skull: Scalp soft tissues and calvarium within normal limits. Sinuses/Orbits: Globes and orbital soft tissues demonstrate no acute finding. Paranasal sinuses and mastoid air cells remain clear. Other: None. IMPRESSION: 1. Normal expected interval evolution of small to moderate sized left MCA territory infarcts. No evidence for hemorrhagic transformation or other complication. Additional previously identified subcentimeter acute ischemic infarcts not well visualized by CT. 2. Slight interval increase in size of cortically based right frontal parenchymal hemorrhage, now measuring 5 mm (previously 3 mm). 3. No other new acute intracranial abnormality. Electronically Signed   By: Jeannine Boga M.D.   On: 03/11/2018 03:41   Ct Head Wo Contrast  Result Date: 03/26/2018 CLINICAL DATA:  Post thrombectomy EXAM: CT HEAD WITHOUT CONTRAST TECHNIQUE: Contiguous axial images were obtained from the base of the skull through the vertex without intravenous contrast. COMPARISON:  Head CT from earlier today FINDINGS: Brain: 3 mm focus of high density along the high and posterior right frontal convexity, on reformats this appears to be intraparenchymal. No hemorrhagic conversion or visible infarct in the area of procedure. Multiple remote small vessel infarcts. Vascular: Major vessels are symmetrically enhancing. Skull: Negative Sinuses/Orbits: Negative These results were called by telephone at the time of interpretation on 04/02/2018 at 8:17 pm to Dr. Cheral Marker , who verbally acknowledged these results. IMPRESSION: New 3 mm high-density within the  posterior right frontal cortex which could be focus of hemorrhage or an enhancing subacute infarct. No hemorrhage or infarct seen along the postoperative left MCA distribution. Electronically Signed   By: Monte Fantasia M.D.   On: 03/26/2018 20:18   Ct Angio Neck W Or Wo Contrast  Result Date: 04/04/2018 CLINICAL DATA:  Code stroke, deficit not specified EXAM: CT ANGIOGRAPHY HEAD AND NECK TECHNIQUE: Multidetector CT imaging of the head and neck was performed using the standard protocol during bolus administration of intravenous contrast. Multiplanar CT image reconstructions and MIPs were obtained to evaluate the vascular anatomy. Carotid stenosis measurements (when applicable) are obtained utilizing NASCET criteria, using the distal internal carotid diameter as the denominator. CONTRAST:  86mL ISOVUE-370 IOPAMIDOL (ISOVUE-370) INJECTION 76% COMPARISON:  None. FINDINGS: CTA NECK FINDINGS Aortic arch: Extensive atherosclerotic calcification. Three vessel branching Right carotid system: Diffuse mainly calcified atherosclerotic plaque which limits visualization of the lumen due to plaque blooming. There is distal common carotid stenosis that measures up to 70% (when compared to the more proximal vessel  given the immediate downstream bifurcation). There is ICA bulb plaque with 70% stenosis. Left carotid system: Much less extensive atherosclerotic plaque, question prior carotid endarterectomy. No stenosis or ulceration. Vertebral arteries: Severe bilateral proximal subclavian stenosis, with possible occlusion on the right. Narrowing on the left involves and extensive segment. No flow is seen within vertebral arteries until the V3 segments, there may be retrograde flow Skeleton: Diffuse degenerative disease.  No acute finding Other neck: Bilateral cataract resection. Upper chest: Hazy density of the lungs which could be from atelectasis. No Kerley lines to implicate edema. Review of the MIP images confirms the above  findings CTA HEAD FINDINGS Anterior circulation: Confluent atherosclerotic calcification on the carotid siphons with small vessel size and plaque blooming limiting luminal visualization. There is a left M2 branch occlusion beginning at the M1 bifurcation with subsequent downstream reconstitution. No contralateral embolism is seen. Posterior circulation: Tiny vertebrobasilar arteries. There may be a basilar interruption from atherosclerosis or developmental variant. Fetal type flow to both posterior cerebral arteries which are symmetrically patent Venous sinuses: Patent as permitted by contrast timing Anatomic variants: As above Delayed phase: Not obtained in the emergent setting Review of the MIP images confirms the above findings Case discussed with Dr. Rory Percy at 5:04 p.m. IMPRESSION: 1. Left M2 branch occlusion beginning at the M1 bifurcation. 2. Severe atherosclerosis which preferentially spares the left carotid circulation, question prior endarterectomy. 3. Severe atheromatous narrowing if not occlusion of bilateral proximal subclavian arteries. There is bilateral fetal type PCA with diffuse thready flow in the posterior fossa. 4. 70% atheromatous narrowing of the right common carotid and ICA bulb. Electronically Signed   By: Monte Fantasia M.D.   On: 03/13/2018 17:16   Mr Jodene Nam Head Wo Contrast  Result Date: 03/09/2018 CLINICAL DATA:  Stroke follow-up. Status post endovascular revascularization of left MCA occlusion yesterday. EXAM: MRI HEAD WITHOUT CONTRAST MRA HEAD WITHOUT CONTRAST TECHNIQUE: Multiplanar, multiecho pulse sequences of the brain and surrounding structures were obtained without intravenous contrast. Angiographic images of the head were obtained using MRA technique without contrast. COMPARISON:  Head CT and CTA 03/11/2018. Brain MRI 07/20/2016. Head MRA 09/30/2011. FINDINGS: MRI HEAD FINDINGS Brain: There is a small to slightly moderate-sized acute left MCA infarct involving the insula,  predominantly posterior frontal lobe as well as operculum, and small amount of the postcentral gyrus. There also punctate acute infarcts involving the left caudate nucleus, right centrum semiovale, parasagittal right parietal cortex, and likely left occipital white matter. There are also small acute infarcts in the left greater than right cerebellar hemispheres. Susceptibility artifact in the right precentral gyrus corresponds to the 4 mm acute hemorrhage on yesterday's CT with minimal surrounding edema. There are numerous chronic microhemorrhages clustered about the right insula and operculum which are new from the 2018 MRI. Multiple additional chronic microhemorrhages are present in the basal ganglia bilaterally and scattered throughout both cerebral hemispheres and cerebellum. Patchy T2 hyperintensities in the cerebral white matter and pons have progressed from the prior MRI and are nonspecific but compatible with moderate chronic small-vessel ischemic disease. There are new chronic lacunar infarcts in the cerebral white matter, and there are chronic lacunar infarcts in the basal ganglia bilaterally. Small chronic infarcts in the cerebellar hemispheres have increased in number from the prior MRI. Mild cerebral atrophy is within normal limits for age. No mass, midline shift, or extra-axial fluid collection is identified. Vascular: Diminutive vertebrobasilar circulation, more fully evaluated below. Skull and upper cervical spine: Unremarkable bone marrow signal. Sinuses/Orbits: Bilateral cataract  extraction. Paranasal sinuses and mastoid air cells are clear. Other: Partially visualized endotracheal and enteric tubes. MRA HEAD FINDINGS The distal vertebral arteries are diminutive with diminished flow most notable in the distal right V3 and V4 segments and with bilateral proximal vertebral artery occlusion shown on yesterday's neck CTA. Patent bilateral PICA, right AICA, and bilateral SCA origins are identified.  There is a chronic lack of visualization of the mid basilar artery which appears patent more proximally and distally, and this may reflect segmental atherosclerotic occlusion or a developmental variant. There are large posterior communicating arteries bilaterally with absence or severe hypoplasia of the right P1 segment. Both PCAs are patent without evidence of significant proximal stenosis. The internal carotid arteries are patent from skull base to carotid termini with extensive bilateral cavernous segment atherosclerotic irregularity resulting in mild stenosis on the right and moderate stenosis on the left. 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 ACAs and MCAs are patent with branch vessel irregularity but no evidence of proximal branch occlusion. The left MCA trifurcation remains patent following thrombectomy although there is moderate stenosis of the inferior and mid M2 origins. There also mild proximal left M1 and left A1 stenoses. IMPRESSION: 1. Small to moderate-sized acute left MCA infarct. 2. Additional predominantly subcentimeter acute infarcts in the cerebral and cerebellar hemispheres bilaterally. 3. 4 mm acute hemorrhage in the right precentral gyrus as seen on CT yesterday. 4. Progressive chronic small vessel ischemia since 2018 with increased number of chronic microhemorrhages and chronic lacunar infarcts. 5. Advanced intracranial atherosclerosis. Revascularized left MCA bifurcation remains patent with moderate M2 origin stenoses. 6. Moderate ICA stenoses. 7. Diminished flow in the distal vertebral arteries with bilateral proximal occlusion shown on prior CTA. 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 8. 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  Electronically Signed   By: Logan Bores M.D.   On: 03/09/2018 17:17   Mr Brain Wo Contrast  Result Date: 03/09/2018 CLINICAL DATA:  Stroke follow-up. Status post endovascular revascularization of left MCA occlusion yesterday. EXAM: MRI HEAD WITHOUT CONTRAST MRA HEAD WITHOUT CONTRAST TECHNIQUE: Multiplanar, multiecho pulse sequences of the brain and surrounding structures were obtained without intravenous contrast. Angiographic images of the head were obtained using MRA technique without contrast. COMPARISON:  Head CT and CTA 03/25/2018. Brain MRI 07/20/2016. Head MRA 09/30/2011. FINDINGS: MRI HEAD FINDINGS Brain: There is a small to slightly moderate-sized acute left MCA infarct involving the insula, predominantly posterior frontal lobe as well as operculum, and small amount of the postcentral gyrus. There also punctate acute infarcts involving the left caudate nucleus, right centrum semiovale, parasagittal right parietal cortex, and likely left occipital white matter. There are also small acute infarcts in the left greater than right cerebellar hemispheres. Susceptibility artifact in the right precentral gyrus corresponds to the 4 mm acute hemorrhage on yesterday's CT with minimal surrounding edema. There are numerous chronic microhemorrhages clustered about the right insula and operculum which are new from the 2018 MRI. Multiple additional chronic microhemorrhages are present in the basal ganglia bilaterally and scattered throughout both cerebral hemispheres and cerebellum. Patchy T2 hyperintensities in the cerebral white matter and pons have progressed from the prior MRI and are nonspecific but compatible with moderate chronic small-vessel ischemic disease. There are new chronic lacunar infarcts  in the cerebral white matter, and there are chronic lacunar infarcts in the basal ganglia bilaterally. Small chronic infarcts in the cerebellar hemispheres have increased in number from the prior MRI. Mild cerebral  atrophy is within normal limits for age. No mass, midline shift, or extra-axial fluid collection is identified. Vascular: Diminutive vertebrobasilar circulation, more fully evaluated below. Skull and upper cervical spine: Unremarkable bone marrow signal. Sinuses/Orbits: Bilateral cataract extraction. Paranasal sinuses and mastoid air cells are clear. Other: Partially visualized endotracheal and enteric tubes. MRA HEAD FINDINGS The distal vertebral arteries are diminutive with diminished flow most notable in the distal right V3 and V4 segments and with bilateral proximal vertebral artery occlusion shown on yesterday's neck CTA. Patent bilateral PICA, right AICA, and bilateral SCA origins are identified. There is a chronic lack of visualization of the mid basilar artery which appears patent more proximally and distally, and this may reflect segmental atherosclerotic occlusion or a developmental variant. There are large posterior communicating arteries bilaterally with absence or severe hypoplasia of the right P1 segment. Both PCAs are patent without evidence of significant proximal stenosis. The internal carotid arteries are patent from skull base to carotid termini with extensive bilateral cavernous segment atherosclerotic irregularity resulting in mild stenosis on the right and moderate stenosis on the left. 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 ACAs and MCAs are patent with branch vessel irregularity but no evidence of proximal branch occlusion. The left MCA trifurcation remains patent following thrombectomy although there is moderate stenosis of the inferior and mid M2 origins. There also mild proximal left M1 and left A1 stenoses. IMPRESSION: 1. Small to moderate-sized acute left MCA infarct. 2. Additional predominantly subcentimeter acute infarcts in the cerebral and cerebellar  hemispheres bilaterally. 3. 4 mm acute hemorrhage in the right precentral gyrus as seen on CT yesterday. 4. Progressive chronic small vessel ischemia since 2018 with increased number of chronic microhemorrhages and chronic lacunar infarcts. 5. Advanced intracranial atherosclerosis. Revascularized left MCA bifurcation remains patent with moderate M2 origin stenoses. 6. Moderate ICA stenoses. 7. Diminished flow in the distal vertebral arteries with bilateral proximal occlusion shown on prior CTA. 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 8. 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 Electronically Signed   By: Logan Bores M.D.   On: 03/09/2018 17:17   Ir Angiogram Extremity Bilateral  Result Date: 03/10/2018 INDICATION: 73 year old female with a history of acute left MCA stroke. She presents within time frame for IV tPA, and is a candidate for mechanical thrombectomy EXAM: ULTRASOUND GUIDED ACCESS RIGHT COMMON FEMORAL ARTERY ULTRASOUND GUIDED ACCESS LEFT COMMON FEMORAL ARTERY FOR ARTERIAL MONITORING ANGIOGRAM RIGHT LOWER EXTREMITY BALLOON ANGIOPLASTY STENOSIS RIGHT COMMON ILIAC ARTERY IN ORDER TO PASS 8 FRENCH SHEATH TO COMPLETE THE THROMBECTOMY CEREBRAL ANGIOGRAM MECHANICAL THROMBECTOMY LEFT MCA EMERGENT LARGE VESSEL OCCLUSION DEPLOYMENT OF ANGIO-SEAL RIGHT COMMON FEMORAL ARTERY COMPARISON:  CT imaging of the same day MEDICATIONS: 2 g Ancef IV. The antibiotic was administered within 1 hour of the procedure ANESTHESIA/SEDATION: General endotracheal tube anesthesia CONTRAST:  96 cc Isovue-300 FLUOROSCOPY TIME:  Fluoroscopy Time: 26 minutes 24 seconds (1,362 mGy). COMPLICATIONS: None TECHNIQUE: Informed written consent was obtained from the patient's family after a thorough discussion of the procedural risks, benefits and alternatives. Specific risks discussed include: Bleeding, infection,  contrast reaction, kidney injury/failure, need for further procedure/surgery, arterial injury or dissection, embolization to new territory, intracranial hemorrhage (10-15% risk), neurologic deterioration, cardiopulmonary collapse, death. All questions were addressed. Maximal Sterile Barrier Technique was utilized including during the procedure including caps, mask, sterile gowns, sterile gloves, sterile drape, hand hygiene and skin antiseptic. A timeout was performed prior to the initiation of the procedure. The anesthesia team was present to provide general endotracheal tube anesthesia and for patient monitoring during the procedure. Interventional neuro radiology nursing staff was also present. 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 Surgical changes of prior endarterectomy evident on prior CT imaging. Left external carotid artery: Patent with antegrade flow. Left internal carotid artery: Normal course caliber and contour of the cervical portion. Vertical and petrous segment patent with normal course caliber contour. Cavernous segment patent. Clinoid segment patent. Antegrade flow of the ophthalmic artery. Ophthalmic segment patent. Terminus patent. Left MCA: Distal M1 occluded at the initiation of the case. There is partial filling of an inferior branch which is the non dominant branch to the frontal. Patent fetal PCA to the left P2 segment. Left ACA: A 1 segment patent. A 2 segment perfuses the right territory. Completion Findings: Left MCA: After 2 passes of mechanical thrombectomy plus aspiration, there is near complete restoration of flow through the left MCA territory, with slow flow through an M3/M4 branch of the parietal region compatible with TI CI 2b flow. 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 Physical exam At the initiation of the case,  palpable bilateral common femoral artery pulses present No palpable distal pulses Doppler positive pulses of the right posterior tibial and anterior tibial (with systolic blood pressures above 200) Doppler positive pulse at the left posterior tibial (with systolic blood pressure above 200), with no Doppler signal at the left anterior tibial At completion of case Doppler positive pulses were only discovered on the right posterior tibial and anterior tibial arteries with blood pressures above 200. In the 140-160 range (goal) Doppler signal was not evident A completion of case Doppler positive pulses were only discovered on the left at the posterior tibial and not the left anterior tibial (with the pressure above 132 systolic). PROCEDURE: Patient is brought emergently to the neuro angiography suite, with the patient identified appropriately and placed supine position on the table. Left radial arterial line was attempted by the anesthesia team. The patient is then prepped and draped in the usual sterile fashion. Ultrasound survey of the right inguinal region was performed with images stored and sent to PACs. 11 blade scalpel was used to make a small incision. Blunt dissection was performed. A micropuncture needle was used access the right common femoral artery under ultrasound. With excellent arterial blood flow returned, an .018 micro wire was passed through the needle, observed to enter the abdominal aorta under fluoroscopy. The needle was removed, and a micropuncture sheath was placed over the wire. The inner dilator and wire were removed, and an 035 Bentson wire was advanced under fluoroscopy into the abdominal aorta. The sheath was removed and a standard 5 [place] vascular sheath was placed. The dilator was removed and the sheath was flushed. Ultrasound survey of the left inguinal region was then performed with images stored and sent to PACs, confirming patency of the vessel. A micropuncture needle was used access  the left common femoral artery under ultrasound. With excellent arterial blood flow  returned, and an .018 micro wire was passed through the needle, observed enter the abdominal aorta under fluoroscopy. The needle was removed, and a micropuncture sheath was placed over the wire. The inner dilator and wire were removed, and an 035 Bentson wire was advanced under fluoroscopy into the abdominal aorta. The sheath was removed and a standard 4 [place] vascular sheath was placed for arterial monitoring. The dilator was removed and the sheath was flushed. A 76F JB-1 diagnostic catheter was then advanced over the wire through the existing right femoral access to the proximal descending thoracic aorta. Wire was then removed. Double flush of the catheter was performed. Catheter was then used to select the left cervical internal carotid artery. Formal angiogram was performed, with roadmap achieved. Exchange length Rosen wire was then passed through the diagnostic catheter to the distal cervical ICA and the diagnostic catheter was removed. The 5 French sheath was removed, with attempt at placing 8 French 55 cm bright tip sheath. Significant resistance was encountered with passing the sheath through the right iliac system. The sheath would not advanced through the pre-existing right common iliac artery stent. Sheath was withdrawn and rotated. We attempted to advance the sheath over the introducer which was pinned on the wire. We also removed the introducer in place to diagnostic catheter into the aorta and attempted to pass the sheath over the diagnostic catheter. None of these maneuvers were successful. Sheath was then withdrawn into the external iliac artery. Balloon angioplasty was performed of the pre-existing right common iliac artery stent with 5 mm x 40 mm standard balloon angioplasty. Balloon was removed and the introducer was then replaced. Eight French sheath was then again attempted to pass over the wire, unsuccessful  through this segment of the iliac artery. The introducer was removed, diagnostic catheter was placed into the cervical segment of the internal carotid artery, and the Rose an wire was exchanged for a 260 cm Amplatz wire. With the stiff wire in place, another attempt was made to pass the sheath which was unsuccessful. Sheath was then again withdrawn to the external iliac artery, introducer was removed, and a second balloon angioplasty 6 mm x 40 mm was performed at the pre-existing stent. The sheath was then successfully advanced over the balloon as the balloon was deflated, into the abdominal aorta. The introducer was again placed after removing the balloon and the sheath was advanced 6 successfully into the aorta. Introducer was withdrawn. Once the sheath was advanced over the wire to the thoracic aorta, sheath was flushed and attached to pressurized and heparinized saline bag for constant forward flow. Eight French 95 cm flow gate balloon catheter was then advanced over the wire to the distal cervical segment. Wire was removed. Then a coaxial intermediate catheter and microcatheter combination was prepared on the back table. This combination was penumbra Ace 64 catheter and a Trevo Provue18 microcatheter, with a synchro soft wire. This combination was then advanced through the balloon guide into the ICA. System was advanced into the internal carotid artery, to the level of the occlusion. The micro wire was then carefully advanced through the occluded segment, with a distal knuckle configuration. Microcatheter was then pushed through the occluded segment and the wire was removed. Intermediate catheter was advanced over the micro wire to the carotid siphon, which would not advance beyond the ophthalmic segment through the terminus. Blood was then aspirated through the hub of the microcatheter, and a gentle contrast injection was performed confirming intraluminal position. A rotating hemostatic  valve was then attached  to the back end of the microcatheter, and a pressurized and heparinized saline bag was attached to the catheter. 4 mm x 40 mm solitaire device was then selected. Back flush was achieved at the rotating hemostatic valve, and then the device was gently advanced through the microcatheter to the distal end. The retriever was then unsheathed by withdrawing the microcatheter under fluoroscopy. Once the retriever was completely unsheathed, the microcatheter was carefully stripped from the delivery wire of the device. Control angiogram was then performed from the balloon catheter. 3 minute time interval was observed. The balloon on the balloon guide was then inflated to profile of the vessel. Constant aspiration was then performed through the intermediate catheter, and constant gentle aspiration was performed at the balloon guide. This aspiration was continued as the retriever was gently and slowly withdrawn with fluoroscopic observation into the distal intermediate catheter. The entire system was then gently withdrawn from the intracranial ICA and into the balloon guide. Once the retriever was entirely removed from the system, free aspiration was confirmed at the hub of the balloon guide, with free blood return confirmed. Control angiogram was then performed. TICI 2a flow was achieved. Repeat angiogram with multiple angulation demonstrated persistent occlusion of parietal branch. The same coaxial system was again passed through the balloon guide catheter. The microcatheter system was then advanced through the occlusion of the parietal branch. Once the micro wire microcatheter were beyond the occlusion, the micro wire was removed and stentreiver device was deployed, stripping the microcatheter from the wire. After the device was deployed across the occlusion, the intermediate catheter was again attempted to advanced to the M1 segment. This was unsuccessful, and would not advance beyond the ophthalmic segment. The balloon at  the balloon guide was inflated to profile of the vessel. Local aspiration was performed at the intermediate catheter upon withdrawal of the device under fluoroscopic observation, with aspiration at the balloon guide. Once the retrieve her and intermediate catheter were entirely removed from the system, free aspiration was confirmed at the hub of the intermediate catheter, with free blood return confirmed. Control angiogram was again performed. Improved flow was confirmed, TICI 2b. Balloon guide was withdrawn and angiogram of the cervical segment was performed. We then removed the balloon guide catheter and deployed an 8 French Angio-Seal at the right common femoral artery access. Angiogram was performed of the right sided access and the left-sided access before completing the case. Patient tolerated the procedure well and remained hemodynamically stable throughout. No complications were encountered. Estimated blood loss approximately 50 cc. IMPRESSION: Status post ultrasound guided access right common femoral artery for cerebral angiogram and a combination of aspiration and mechanical thrombectomy of left M1 emergent large vessel occlusion, and restoration of TICI 2b flow after 2 passes. Status post balloon angioplasty to 6 mm of proximal right common iliac artery for treatment of a restrictive lesion in order to pass 8 French sheath to perform the thrombectomy. Status post deployment of Angio-Seal at the right common femoral artery. At the conclusion of the case, a 4 [place] sheath remains in left common femoral artery for arterial monitoring. Signed, Dulcy Fanny. Dellia Nims, RPVI [facility] Radiology PLAN: Formal CT after the case Right hip straight overnight status post 8 French device and Angio-Seal closure Left common femoral artery for French sheath may be removed when hemodynamic monitoring is no longer needed with manual pressure. Goal blood pressure 542  [id number] systolic given the incomplete flow  restoration Electronically Signed   By: Corrie Mckusick D.O.   On: 03/14/2018 20:39   Ir US Guide Vasc Access Right  Result Date: 03/30/2018 INDICATION: 73 year old female with a history of acute left MCA stroke. She presents within time frame for IV tPA, and is a candidate for mechanical thrombectomy EXAM: ULTRASOUND GUIDED ACCESS RIGHT COMMON FEMORAL ARTERY ULTRASOUND GUIDED ACCESS LEFT COMMON FEMORAL ARTERY FOR ARTERIAL MONITORING ANGIOGRAM RIGHT LOWER EXTREMITY BALLOON ANGIOPLASTY STENOSIS RIGHT COMMON ILIAC ARTERY IN ORDER TO PASS 8 FRENCH SHEATH TO COMPLETE THE THROMBECTOMY CEREBRAL ANGIOGRAM MECHANICAL THROMBECTOMY LEFT MCA EMERGENT LARGE VESSEL OCCLUSION DEPLOYMENT OF ANGIO-SEAL RIGHT COMMON FEMORAL ARTERY COMPARISON:  CT imaging of the same day MEDICATIONS: 2 g Ancef IV. The antibiotic was administered within 1 hour of the procedure ANESTHESIA/SEDATION: General endotracheal tube anesthesia CONTRAST:  96 cc Isovue-300 FLUOROSCOPY TIME:  Fluoroscopy Time: 26 minutes 24 seconds (1,362 mGy). COMPLICATIONS: None TECHNIQUE: Informed written consent was obtained from the patient's family after a thorough discussion of the procedural risks, benefits and alternatives. Specific risks discussed include: Bleeding, infection, contrast reaction, kidney injury/failure, need for further procedure/surgery, arterial injury or dissection, embolization to new territory, intracranial hemorrhage (10-15% risk), neurologic deterioration, cardiopulmonary collapse, death. All questions were addressed. Maximal Sterile Barrier Technique was utilized including during the procedure including caps, mask, sterile gowns, sterile gloves, sterile drape, hand hygiene and skin antiseptic. A timeout was performed prior to the initiation of the procedure. The anesthesia team was present to provide general endotracheal tube anesthesia and for patient monitoring during the procedure. Interventional  neuro radiology nursing staff was also present. 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 Surgical changes of prior endarterectomy evident on prior CT imaging. Left external carotid artery: Patent with antegrade flow. Left internal carotid artery: Normal course caliber and contour of the cervical portion. Vertical and petrous segment patent with normal course caliber contour. Cavernous segment patent. Clinoid segment patent. Antegrade flow of the ophthalmic artery. Ophthalmic segment patent. Terminus patent. Left MCA: Distal M1 occluded at the initiation of the case. There is partial filling of an inferior branch which is the non dominant branch to the frontal. Patent fetal PCA to the left P2 segment. Left ACA: A 1 segment patent. A 2 segment perfuses the right territory. Completion Findings: Left MCA: After 2 passes of mechanical thrombectomy plus aspiration, there is near complete restoration of flow through the left MCA territory, with slow flow through an M3/M4 branch of the parietal region compatible with TI CI 2b flow. 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 Physical exam At the initiation of the case, palpable bilateral common femoral artery pulses present No palpable distal pulses Doppler positive pulses of the right posterior tibial and anterior tibial (with systolic blood pressures above 200) Doppler positive pulse at the left posterior tibial (with systolic blood pressure above 200), with no Doppler signal at the left anterior tibial At completion of case Doppler positive pulses were only discovered on the right posterior tibial and anterior tibial arteries with blood pressures above 200. In the 140-160 range (goal) Doppler signal was not evident A completion of case Doppler positive pulses were only discovered on the left at the posterior  tibial and not the left anterior tibial (with the pressure above 829 systolic). PROCEDURE: Patient is brought emergently to the neuro angiography suite, with the patient identified appropriately and placed supine position on the table.  Left radial arterial line was attempted by the anesthesia team. The patient is then prepped and draped in the usual sterile fashion. Ultrasound survey of the right inguinal region was performed with images stored and sent to PACs. 11 blade scalpel was used to make a small incision. Blunt dissection was performed. A micropuncture needle was used access the right common femoral artery under ultrasound. With excellent arterial blood flow returned, an .018 micro wire was passed through the needle, observed to enter the abdominal aorta under fluoroscopy. The needle was removed, and a micropuncture sheath was placed over the wire. The inner dilator and wire were removed, and an 035 Bentson wire was advanced under fluoroscopy into the abdominal aorta. The sheath was removed and a standard 5 [place] vascular sheath was placed. The dilator was removed and the sheath was flushed. Ultrasound survey of the left inguinal region was then performed with images stored and sent to PACs, confirming patency of the vessel. A micropuncture needle was used access the left common femoral artery under ultrasound. With excellent arterial blood flow returned, and an .018 micro wire was passed through the needle, observed enter the abdominal aorta under fluoroscopy. The needle was removed, and a micropuncture sheath was placed over the wire. The inner dilator and wire were removed, and an 035 Bentson wire was advanced under fluoroscopy into the abdominal aorta. The sheath was removed and a standard 4 [place] vascular sheath was placed for arterial monitoring. The dilator was removed and the sheath was flushed. A 56F JB-1 diagnostic catheter was then advanced over the wire through the existing right femoral access  to the proximal descending thoracic aorta. Wire was then removed. Double flush of the catheter was performed. Catheter was then used to select the left cervical internal carotid artery. Formal angiogram was performed, with roadmap achieved. Exchange length Rosen wire was then passed through the diagnostic catheter to the distal cervical ICA and the diagnostic catheter was removed. The 5 French sheath was removed, with attempt at placing 8 French 55 cm bright tip sheath. Significant resistance was encountered with passing the sheath through the right iliac system. The sheath would not advanced through the pre-existing right common iliac artery stent. Sheath was withdrawn and rotated. We attempted to advance the sheath over the introducer which was pinned on the wire. We also removed the introducer in place to diagnostic catheter into the aorta and attempted to pass the sheath over the diagnostic catheter. None of these maneuvers were successful. Sheath was then withdrawn into the external iliac artery. Balloon angioplasty was performed of the pre-existing right common iliac artery stent with 5 mm x 40 mm standard balloon angioplasty. Balloon was removed and the introducer was then replaced. Eight French sheath was then again attempted to pass over the wire, unsuccessful through this segment of the iliac artery. The introducer was removed, diagnostic catheter was placed into the cervical segment of the internal carotid artery, and the Rose an wire was exchanged for a 260 cm Amplatz wire. With the stiff wire in place, another attempt was made to pass the sheath which was unsuccessful. Sheath was then again withdrawn to the external iliac artery, introducer was removed, and a second balloon angioplasty 6 mm x 40 mm was performed at the pre-existing stent. The sheath was then successfully advanced over the balloon as the balloon was deflated, into the abdominal aorta. The introducer was again placed after removing the  balloon and the sheath was advanced 6 successfully into the aorta.  Introducer was withdrawn. Once the sheath was advanced over the wire to the thoracic aorta, sheath was flushed and attached to pressurized and heparinized saline bag for constant forward flow. Eight French 95 cm flow gate balloon catheter was then advanced over the wire to the distal cervical segment. Wire was removed. Then a coaxial intermediate catheter and microcatheter combination was prepared on the back table. This combination was penumbra Ace 64 catheter and a Trevo Provue18 microcatheter, with a synchro soft wire. This combination was then advanced through the balloon guide into the ICA. System was advanced into the internal carotid artery, to the level of the occlusion. The micro wire was then carefully advanced through the occluded segment, with a distal knuckle configuration. Microcatheter was then pushed through the occluded segment and the wire was removed. Intermediate catheter was advanced over the micro wire to the carotid siphon, which would not advance beyond the ophthalmic segment through the terminus. Blood was then aspirated through the hub of the microcatheter, and a gentle contrast injection was performed confirming intraluminal position. A rotating hemostatic valve was then attached to the back end of the microcatheter, and a pressurized and heparinized saline bag was attached to the catheter. 4 mm x 40 mm solitaire device was then selected. Back flush was achieved at the rotating hemostatic valve, and then the device was gently advanced through the microcatheter to the distal end. The retriever was then unsheathed by withdrawing the microcatheter under fluoroscopy. Once the retriever was completely unsheathed, the microcatheter was carefully stripped from the delivery wire of the device. Control angiogram was then performed from the balloon catheter. 3 minute time interval was observed. The balloon on the balloon guide was  then inflated to profile of the vessel. Constant aspiration was then performed through the intermediate catheter, and constant gentle aspiration was performed at the balloon guide. This aspiration was continued as the retriever was gently and slowly withdrawn with fluoroscopic observation into the distal intermediate catheter. The entire system was then gently withdrawn from the intracranial ICA and into the balloon guide. Once the retriever was entirely removed from the system, free aspiration was confirmed at the hub of the balloon guide, with free blood return confirmed. Control angiogram was then performed. TICI 2a flow was achieved. Repeat angiogram with multiple angulation demonstrated persistent occlusion of parietal branch. The same coaxial system was again passed through the balloon guide catheter. The microcatheter system was then advanced through the occlusion of the parietal branch. Once the micro wire microcatheter were beyond the occlusion, the micro wire was removed and stentreiver device was deployed, stripping the microcatheter from the wire. After the device was deployed across the occlusion, the intermediate catheter was again attempted to advanced to the M1 segment. This was unsuccessful, and would not advance beyond the ophthalmic segment. The balloon at the balloon guide was inflated to profile of the vessel. Local aspiration was performed at the intermediate catheter upon withdrawal of the device under fluoroscopic observation, with aspiration at the balloon guide. Once the retrieve her and intermediate catheter were entirely removed from the system, free aspiration was confirmed at the hub of the intermediate catheter, with free blood return confirmed. Control angiogram was again performed. Improved flow was confirmed, TICI 2b. Balloon guide was withdrawn and angiogram of the cervical segment was performed. We then removed the balloon guide catheter and deployed an 8 French Angio-Seal at the  right common femoral artery access. Angiogram was performed of the right sided access and the left-sided  access before completing the case. Patient tolerated the procedure well and remained hemodynamically stable throughout. No complications were encountered. Estimated blood loss approximately 50 cc. IMPRESSION: Status post ultrasound guided access right common femoral artery for cerebral angiogram and a combination of aspiration and mechanical thrombectomy of left M1 emergent large vessel occlusion, and restoration of TICI 2b flow after 2 passes. Status post balloon angioplasty to 6 mm of proximal right common iliac artery for treatment of a restrictive lesion in order to pass 8 French sheath to perform the thrombectomy. Status post deployment of Angio-Seal at the right common femoral artery. At the conclusion of the case, a 4 [place] sheath remains in left common femoral artery for arterial monitoring. Signed, Dulcy Fanny. Dellia Nims, RPVI [facility]iology [facility] Radiology PLAN: Formal CT after the case Right hip straight overnight status post 8 French device and Angio-Seal closure Left common femoral artery for French sheath may be removed when hemodynamic monitoring is no longer needed with manual pressure. Goal blood pressure 725 [id number] systolic given the incomplete flow restoration Electronically Signed   By: Corrie Mckusick D.O.   On: 03/19/2018 20:39   Dg Chest Port 1 View  Result Date: 03/12/2018 CLINICAL DATA:  Endotracheal tube placement. EXAM: PORTABLE CHEST 1 VIEW COMPARISON:  Chest radiograph performed 03/11/2018 FINDINGS: The patient's endotracheal tube is seen ending 1-2 cm above the carina. This could be retracted 2 cm. The patient's enteric tube is noted extending below the diaphragm. Small bilateral pleural effusions are noted. Hazy bilateral airspace opacities raise concern for pulmonary edema. No pneumothorax is seen. The cardiomediastinal silhouette is  mildly enlarged. No acute osseous abnormalities are identified. IMPRESSION: 1. Endotracheal tube seen ending 1-2 cm above the carina. This could be retracted 2 cm. 2. Small bilateral pleural effusions noted. Hazy bilateral airspace opacities raise concern for pulmonary edema. 3. Mild cardiomegaly. Electronically Signed   By: Garald Balding M.D.   On: 03/12/2018 06:38   Dg Chest Port 1 View  Result Date: 03/11/2018 CLINICAL DATA:  Acute respiratory failure with hypoxia. EXAM: PORTABLE CHEST 1 VIEW COMPARISON:  03/10/2018 FINDINGS: Endotracheal tube has tip 5.6 cm above the carina. Nasogastric tube unchanged with tip over the stomach in the left upper quadrant. Lungs are adequately inflated with minimal persistent left retrocardiac opacification. Mild stable cardiomegaly. Remainder of the exam is unchanged. IMPRESSION: Mild left retrocardiac opacification unchanged likely atelectasis. Mild stable cardiomegaly. Tubes and lines as described. Electronically Signed   By: Marin Olp M.D.   On: 03/11/2018 08:17   Dg Chest Port 1 View  Result Date: 03/10/2018 CLINICAL DATA:  Cerebral infarction and respiratory failure. EXAM: PORTABLE CHEST 1 VIEW COMPARISON:  03/28/2018 FINDINGS: Endotracheal tube remains present with the tip approximately 3 cm above the carina. Gastric decompression tube has been advanced and now extends further into the stomach. Stable significant cardiac enlargement. Lungs show some improved aeration at the right base with mild residual right basilar atelectasis remaining. Left lower lobe atelectasis is relatively stable. No pulmonary edema, significant pleural fluid or pneumothorax. IMPRESSION: Advancement of gastric decompression tube further into the stomach. Improved aeration at the right lung base with residual mild right basilar atelectasis. Stable left lower lobe atelectasis. Electronically Signed   By: Aletta Edouard M.D.   On: 03/10/2018 08:18   Portable Chest X-ray  Result Date:  04/04/2018 CLINICAL DATA:  Intubated EXAM: PORTABLE CHEST 1 VIEW COMPARISON:  06/01/2017 chest radiograph. FINDINGS: Endotracheal tube tip is 4.2 cm above the carina.  Enteric tube terminates at the esophagogastric junction with the side port in the lower thoracic esophagus. Stable cardiomediastinal silhouette with moderate cardiomegaly. No pneumothorax. No pleural effusion. Hazy lower parahilar lung opacities, most prominent in the right lower lung, worsened. IMPRESSION: 1. Well-positioned endotracheal tube. 2. Enteric tube terminates at the esophagogastric junction with the side port in the lower thoracic esophagus, consider advancing 8-10 cm. 3. Moderate cardiomegaly. Worsening hazy lower parahilar lung opacities, most prominent in the right lower lung, favor pulmonary edema. Electronically Signed   By: Ilona Sorrel M.D.   On: 03/15/2018 22:12   Ir Percutaneous Art Thrombectomy/infusion Intracranial Inc Diag Angio  Result Date: 03/29/2018 INDICATION: 73 year old female with a history of acute left MCA stroke. She presents within time frame for IV tPA, and is a candidate for mechanical thrombectomy EXAM: ULTRASOUND GUIDED ACCESS RIGHT COMMON FEMORAL ARTERY ULTRASOUND GUIDED ACCESS LEFT COMMON FEMORAL ARTERY FOR ARTERIAL MONITORING ANGIOGRAM RIGHT LOWER EXTREMITY BALLOON ANGIOPLASTY STENOSIS RIGHT COMMON ILIAC ARTERY IN ORDER TO PASS 8 FRENCH SHEATH TO COMPLETE THE THROMBECTOMY CEREBRAL ANGIOGRAM MECHANICAL THROMBECTOMY LEFT MCA EMERGENT LARGE VESSEL OCCLUSION DEPLOYMENT OF ANGIO-SEAL RIGHT COMMON FEMORAL ARTERY COMPARISON:  CT imaging of the same day MEDICATIONS: 2 g Ancef IV. The antibiotic was administered within 1 hour of the procedure ANESTHESIA/SEDATION: General endotracheal tube anesthesia CONTRAST:  96 cc Isovue-300 FLUOROSCOPY TIME:  Fluoroscopy Time: 26 minutes 24 seconds (1,362 mGy). COMPLICATIONS: None TECHNIQUE: Informed written consent was obtained from the patient's family after a thorough  discussion of the procedural risks, benefits and alternatives. Specific risks discussed include: Bleeding, infection, contrast reaction, kidney injury/failure, need for further procedure/surgery, arterial injury or dissection, embolization to new territory, intracranial hemorrhage (10-15% risk), neurologic deterioration, cardiopulmonary collapse, death. All questions were addressed. Maximal Sterile Barrier Technique was utilized including during the procedure including caps, mask, sterile gowns, sterile gloves, sterile drape, hand hygiene and skin antiseptic. A timeout was performed prior to the initiation of the procedure. The anesthesia team was present to provide general endotracheal tube anesthesia and for patient monitoring during the procedure. Interventional neuro radiology nursing staff was also present. 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 Surgical changes of prior endarterectomy evident on prior CT imaging. Left external carotid artery: Patent with antegrade flow. Left internal carotid artery: Normal course caliber and contour of the cervical portion. Vertical and petrous segment patent with normal course caliber contour. Cavernous segment patent. Clinoid segment patent. Antegrade flow of the ophthalmic artery. Ophthalmic segment patent. Terminus patent. Left MCA: Distal M1 occluded at the initiation of the case. There is partial filling of an inferior branch which is the non dominant branch to the frontal. Patent fetal PCA to the left P2 segment. Left ACA: A 1 segment patent. A 2 segment perfuses the right territory. Completion Findings: Left MCA: After 2 passes of mechanical thrombectomy plus aspiration, there is near complete restoration of flow through the left MCA territory, with slow flow through an M3/M4 branch of the parietal region compatible with TI CI 2b flow. Extensive  calcified atherosclerotic disease of the aorta bilateral iliac arteries. Physical exam At the initiation of the case, palpable bilateral common femoral artery pulses present No palpable distal pulses Doppler positive pulses of the right posterior tibial and anterior tibial (with systolic blood pressures above 200) Doppler positive pulse at the left posterior tibial (with systolic blood pressure above 200), with no Doppler signal at the left anterior  tibial At completion of case Doppler positive pulses were only discovered on the right posterior tibial and anterior tibial arteries with blood pressures above 200. In the 140-160 range (goal) Doppler signal was not evident A completion of case Doppler positive pulses were only discovered on the left at the posterior tibial and not the left anterior tibial (with the pressure above 865 systolic). PROCEDURE: Patient is brought emergently to the neuro angiography suite, with the patient identified appropriately and placed supine position on the table. Left radial arterial line was attempted by the anesthesia team. The patient is then prepped and draped in the usual sterile fashion. Ultrasound survey of the right inguinal region was performed with images stored and sent to PACs. 11 blade scalpel was used to make a small incision. Blunt dissection was performed. A micropuncture needle was used access the right common femoral artery under ultrasound. With excellent arterial blood flow returned, an .018 micro wire was passed through the needle, observed to enter the abdominal aorta under fluoroscopy. The needle was removed, and a micropuncture sheath was placed over the wire. The inner dilator and wire were removed, and an 035 Bentson wire was advanced under fluoroscopy into the abdominal aorta. The sheath was removed and a standard 5 [place] vascular sheath was placed. The dilator was removed and the sheath was flushed. Ultrasound survey of the left inguinal region was then  performed with images stored and sent to PACs, confirming patency of the vessel. A micropuncture needle was used access the left common femoral artery under ultrasound. With excellent arterial blood flow returned, and an .018 micro wire was passed through the needle, observed enter the abdominal aorta under fluoroscopy. The needle was removed, and a micropuncture sheath was placed over the wire. The inner dilator and wire were removed, and an 035 Bentson wire was advanced under fluoroscopy into the abdominal aorta. The sheath was removed and a standard 4 [place] vascular sheath was placed for arterial monitoring. The dilator was removed and the sheath was flushed. A 45F JB-1 diagnostic catheter was then advanced over the wire through the existing right femoral access to the proximal descending thoracic aorta. Wire was then removed. Double flush of the catheter was performed. Catheter was then used to select the left cervical internal carotid artery. Formal angiogram was performed, with roadmap achieved. Exchange length Rosen wire was then passed through the diagnostic catheter to the distal cervical ICA and the diagnostic catheter was removed. The 5 French sheath was removed, with attempt at placing 8 French 55 cm bright tip sheath. Significant resistance was encountered with passing the sheath through the right iliac system. The sheath would not advanced through the pre-existing right common iliac artery stent. Sheath was withdrawn and rotated. We attempted to advance the sheath over the introducer which was pinned on the wire. We also removed the introducer in place to diagnostic catheter into the aorta and attempted to pass the sheath over the diagnostic catheter. None of these maneuvers were successful. Sheath was then withdrawn into the external iliac artery. Balloon angioplasty was performed of the pre-existing right common iliac artery stent with 5 mm x 40 mm standard balloon angioplasty. Balloon was removed  and the introducer was then replaced. Eight French sheath was then again attempted to pass over the wire, unsuccessful through this segment of the iliac artery. The introducer was removed, diagnostic catheter was placed into the cervical segment of the internal carotid artery, and the Rose an wire was exchanged for a 260  cm Amplatz wire. With the stiff wire in place, another attempt was made to pass the sheath which was unsuccessful. Sheath was then again withdrawn to the external iliac artery, introducer was removed, and a second balloon angioplasty 6 mm x 40 mm was performed at the pre-existing stent. The sheath was then successfully advanced over the balloon as the balloon was deflated, into the abdominal aorta. The introducer was again placed after removing the balloon and the sheath was advanced 6 successfully into the aorta. Introducer was withdrawn. Once the sheath was advanced over the wire to the thoracic aorta, sheath was flushed and attached to pressurized and heparinized saline bag for constant forward flow. Eight French 95 cm flow gate balloon catheter was then advanced over the wire to the distal cervical segment. Wire was removed. Then a coaxial intermediate catheter and microcatheter combination was prepared on the back table. This combination was penumbra Ace 64 catheter and a Trevo Provue18 microcatheter, with a synchro soft wire. This combination was then advanced through the balloon guide into the ICA. System was advanced into the internal carotid artery, to the level of the occlusion. The micro wire was then carefully advanced through the occluded segment, with a distal knuckle configuration. Microcatheter was then pushed through the occluded segment and the wire was removed. Intermediate catheter was advanced over the micro wire to the carotid siphon, which would not advance beyond the ophthalmic segment through the terminus. Blood was then aspirated through the hub of the microcatheter, and a  gentle contrast injection was performed confirming intraluminal position. A rotating hemostatic valve was then attached to the back end of the microcatheter, and a pressurized and heparinized saline bag was attached to the catheter. 4 mm x 40 mm solitaire device was then selected. Back flush was achieved at the rotating hemostatic valve, and then the device was gently advanced through the microcatheter to the distal end. The retriever was then unsheathed by withdrawing the microcatheter under fluoroscopy. Once the retriever was completely unsheathed, the microcatheter was carefully stripped from the delivery wire of the device. Control angiogram was then performed from the balloon catheter. 3 minute time interval was observed. The balloon on the balloon guide was then inflated to profile of the vessel. Constant aspiration was then performed through the intermediate catheter, and constant gentle aspiration was performed at the balloon guide. This aspiration was continued as the retriever was gently and slowly withdrawn with fluoroscopic observation into the distal intermediate catheter. The entire system was then gently withdrawn from the intracranial ICA and into the balloon guide. Once the retriever was entirely removed from the system, free aspiration was confirmed at the hub of the balloon guide, with free blood return confirmed. Control angiogram was then performed. TICI 2a flow was achieved. Repeat angiogram with multiple angulation demonstrated persistent occlusion of parietal branch. The same coaxial system was again passed through the balloon guide catheter. The microcatheter system was then advanced through the occlusion of the parietal branch. Once the micro wire microcatheter were beyond the occlusion, the micro wire was removed and stentreiver device was deployed, stripping the microcatheter from the wire. After the device was deployed across the occlusion, the intermediate catheter was again attempted  to advanced to the M1 segment. This was unsuccessful, and would not advance beyond the ophthalmic segment. The balloon at the balloon guide was inflated to profile of the vessel. Local aspiration was performed at the intermediate catheter upon withdrawal of the device under fluoroscopic observation, with aspiration at  the balloon guide. Once the retrieve her and intermediate catheter were entirely removed from the system, free aspiration was confirmed at the hub of the intermediate catheter, with free blood return confirmed. Control angiogram was again performed. Improved flow was confirmed, TICI 2b. Balloon guide was withdrawn and angiogram of the cervical segment was performed. We then removed the balloon guide catheter and deployed an 8 French Angio-Seal at the right common femoral artery access. Angiogram was performed of the right sided access and the left-sided access before completing the case. Patient tolerated the procedure well and remained hemodynamically stable throughout. No complications were encountered. Estimated blood loss approximately 50 cc. IMPRESSION: Status post ultrasound guided access right common femoral artery for cerebral angiogram and a combination of aspiration and mechanical thrombectomy of left M1 emergent large vessel occlusion, and restoration of TICI 2b flow after 2 passes. Status post balloon angioplasty to 6 mm of proximal right common iliac artery for treatment of a restrictive lesion in order to pass 8 French sheath to perform the thrombectomy. Status post deployment of Angio-Seal at the right common femoral artery. At the conclusion of the case, a 4 [place] sheath remains in left common femoral artery for arterial monitoring. Signed, Dulcy Fanny. Dellia Nims, RPVI [facility] Radiology PLAN: Formal CT after the case Right hip straight overnight status post 8 French device and Angio-Seal closure Left common femoral artery for French  sheath may be removed when hemodynamic monitoring is no longer needed with manual pressure. Goal blood pressure 937 [id number] systolic given the incomplete flow restoration Electronically Signed   By: Corrie Mckusick D.O.   On: 03/12/2018 20:39   Ct Head Code Stroke Wo Contrast  Result Date: 04/04/2018 CLINICAL DATA:  Code stroke.  Deficit not specified EXAM: CT HEAD WITHOUT CONTRAST TECHNIQUE: Contiguous axial images were obtained from the base of the skull through the vertex without intravenous contrast. COMPARISON:  Head CT 07/18/2016 and brain MRI 07/20/2016 FINDINGS: Brain: Multiple remote lacunar infarcts in the deep white matter and deep gray nuclei, chronic when compared to prior head CT and brain MRI. Small remote left cerebellar infarct. No evidence of acute infarct. No hemorrhage or hydrocephalus. Vascular: No hyperdense vessel. Skull: Normal. Negative for fracture or focal lesion. Sinuses/Orbits: No acute finding. Other: These results were communicated to Dr. Rory Percy at 4:43 pmon 01/24/2020by text page via the [facility] messaging system. ASPECTS [facility] Stroke Program Early CT Score) Not scored without localizing symptoms. Motion artifact at the vertex blurring cortex. IMPRESSION: 1. No acute finding. 2. Motion artifact at the vertex. 3. Multiple remote small vessel infarcts. Electronically Signed   By: Monte Fantasia M.D.   On: 03/27/2018 16:45   Vas [place] Lower Extremity Venous (dvt)  Result Date: 03/09/2018  Lower Venous Study Indications: Stroke.  Limitations: Body habitus. Performing Technologist: Maudry Mayhew MHA, RDMS, RVT, RDCS  Examination Guidelines: A complete evaluation includes B-mode imaging, spectral Doppler, color Doppler, and power Doppler as needed of all accessible portions of each vessel. Bilateral testing is considered an integral part of a complete examination. Limited examinations for reoccurring indications may be performed as noted.  Right Venous Findings:  +---------+---------------+---------+-----------+----------+--------------+          CompressibilityPhasicitySpontaneityPropertiesSummary        +---------+---------------+---------+-----------+----------+--------------+ CFV  Not visualized +---------+---------------+---------+-----------+----------+--------------+ SFJ                                                   Not visualized +---------+---------------+---------+-----------+----------+--------------+ FV Prox  Full                                                        +---------+---------------+---------+-----------+----------+--------------+ FV Mid   Full                                                        +---------+---------------+---------+-----------+----------+--------------+ FV DistalFull                                                        +---------+---------------+---------+-----------+----------+--------------+ PFV      Full                                                        +---------+---------------+---------+-----------+----------+--------------+ POP      Full           Yes      Yes                                 +---------+---------------+---------+-----------+----------+--------------+ PTV      Full                                                        +---------+---------------+---------+-----------+----------+--------------+ PERO                                                  Not visualized +---------+---------------+---------+-----------+----------+--------------+  Left Venous Findings: +---------+---------------+---------+-----------+----------+-------+          CompressibilityPhasicitySpontaneityPropertiesSummary +---------+---------------+---------+-----------+----------+-------+ CFV      Full           Yes      Yes                           +---------+---------------+---------+-----------+----------+-------+ SFJ      Full                                                 +---------+---------------+---------+-----------+----------+-------+ FV Prox  Full                                                 +---------+---------------+---------+-----------+----------+-------+  FV Mid   Full                                                 +---------+---------------+---------+-----------+----------+-------+ FV DistalFull                                                 +---------+---------------+---------+-----------+----------+-------+ PFV      Full                                                 +---------+---------------+---------+-----------+----------+-------+ POP      Full           Yes      Yes                          +---------+---------------+---------+-----------+----------+-------+ PTV      Full                                                 +---------+---------------+---------+-----------+----------+-------+ PERO     Full                                                 +---------+---------------+---------+-----------+----------+-------+    Summary: Right: There is no evidence of deep vein thrombosis in the lower extremity. However, portions of this examination were limited- see technologist comments above. No cystic structure found in the popliteal fossa. Left: There is no evidence of deep vein thrombosis in the lower extremity. No cystic structure found in the popliteal fossa.  *See table(s) above for measurements and observations. Electronically signed by Curt Jews MD on 03/09/2018 at 5:40:37 PM.    Final     PHYSICAL EXAM  Temp:  [97.7 F (36.5 C)-98.5 F (36.9 C)] 97.7 F (36.5 C) (01/05 1200) Pulse Rate:  [49-76] 63 (01/05 1519) Resp:  [11-30] 26 (01/05 1519) BP: (103-154)/(39-62) 139/62 (01/05 1519) SpO2:  [80 %-100 %] 100 % (01/05 1519) Arterial Line BP: (99-165)/(38-62) 137/62  (01/05 1500) FiO2 (%):  [30 %-50 %] 50 % (01/05 1519) Weight:  [88.2 kg] 88.2 kg (01/05 0449)  General - Well nourished, well developed, intubated on sedation.  Ophthalmologic - fundi not visualized due to noncooperation.  Cardiovascular - Regular rate and rhythm.  Neuro - intubated now off  sedation,   she is stuporous and unresponsive and not following commands. Globally aphasic Eyes mid position, no deviation, however no doll's eyes.  Not blinking to visual threat bilaterally.  Pupil 2 mm bilaterally, reactive to light.  Left corneal present, right corneal weak reflexes.  Positive gag and cough.  Facial symmetry not able to test due to ET tube.  Left upper and lower extremity spontaneous movements against gravity2/5 on pain stimulation.  Right upper and lower extremity trace withdrawal to pain stimulaiton.  DTR 1+, Babinski  negative. Sensation, coordination and gait not tested.   ASSESSMENT/PLAN Ms. LYNNSEY BARBARA is a 73 y.o. female with history of stroke, hypertension, PVD, diabetes, hyperlipidemia, status post right CEA, CHF admitted for right-sided weakness and aphasia. TPA given.    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  However, cardioembolic source cannot be excluded.  Resultant intubated on sedation, right hemiparesis  CT showed multiple old lacunar infarcts  CTA head and neck left M1/M2 occlusion.  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  Right precentral gyrus petechial hemorrhage.  MRA left MCA patent with moderate M2 origin stenosis  CT head repeat shows slight increase in right precentral gyrus petechial hemorrhage  given on DAPT  2D Echo EF 30 to 35% down from 40 to 45% in 05/2017  LE venous Doppler no DVT  LDL 92  HgbA1c 7.1  SCDs for VTE prophylaxis  aspirin 81 mg daily and clopidogrel 75 mg daily prior to admission, now on aspirin 81 and Plavix due to non-STEMI.   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 Einar Gip as outpatient  If patient has reasonable neurological functional outcome, may consider outpatient follow-up with vascular surgery for subclavian artery stenting  Non-STEMI and cardiomyopathy  Troponin 4.62-6.47-5.08-4.7-3.8-3.88  EF 30 to 35% down from 40 to 45% in 05/2017  Cardiology on board  No intervention recommended  Started on aspirin and Plavix  CT head repeat 03/11/18 shows slight increase in [facility] in  right precentral gyrus petechial hemorrhage given on DAPT  Tapered off nitroglycerin IV  AKI on CKD stage III  creatinine 2.20-2.25->2.97.4.68  CCM on board  BMP monitoring  continue IV fluid   Diabetes  HgbA1c 7.1 goal < 7.0  Uncontrolled  CBG monitoring  SSI  Hypertension . Stable . 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  Put on DAPT and Lipitor 80  Other Active Problems  Hypokalemia-supplement  Hyperglycemia   Hospital day # 4 Plan : Patient's condition  seems to have declined with worsening renal failure, decreased urine output and depressed mental status.  I had a long discussion with the patient's daughter and Dr. Valeta Harms from critical care medicine and nephrology.  It may be appropriate to consider palliative care discussion as her condition is continuing to decline..This patient is critically ill due to left MCA stroke, severe multivessel atherosclerosis and stenosis, CHF, non-STEMI, hyperglycemia and at significant risk of neurological worsening, death form recurrent stroke, hemorrhagic conversion, heart failure, seizure, DKA. This patient's care requires constant monitoring of vital  signs, hemodynamics, respiratory and cardiac monitoring, review of multiple databases, neurological assessment, discussion with family, other specialists and medical decision making of high complexity. I spent 40 minutes of neurocritical care time in the care of this patient. I had long discussion with her daughter at bedside, updated pt current condition, treatment plan and potential prognosis.She expressed understanding and appreciation.    Antony Contras, MD Stroke Neurology 03/12/2018 5:06 PM    To contact Stroke Continuity provider, please refer to [email or web address] After hours, contact General Neurology

## 2018-03-12 NOTE — Progress Notes (Signed)
eLink Physician-Brief Progress Note Patient Name: Yvonne Patel DOB: 09-26-1945 MRN: [id number]   Date of Service  03/12/2018  HPI/Events of Note  Self extubated - Sat = 100% on BiPAP. However, she has a poor cough according to bedside nurse.   eICU Interventions  Will order: 1. ABG at 1:25 AM (off ventilator X 30 minutes). 2. Will ask bedside team to evaluate patient at bedside.      Intervention Category Major Interventions: Respiratory failure - evaluation and management  Sommer,Steven Eugene 03/12/2018, 1:12 AM

## 2018-03-13 ENCOUNTER — Inpatient Hospital Stay (HOSPITAL_COMMUNITY): Payer: PPO

## 2018-03-13 DIAGNOSIS — Z452 Encounter for adjustment and management of vascular access device: Secondary | ICD-10-CM

## 2018-03-13 LAB — GLUCOSE, CAPILLARY
GLUCOSE-CAPILLARY: 287 mg/dL — AB (ref 70–99)
GLUCOSE-CAPILLARY: 272 mg/dL — AB (ref 70–99)
Glucose-Capillary: 256 mg/dL — ABNORMAL HIGH (ref 70–99)
Glucose-Capillary: 306 mg/dL — ABNORMAL HIGH (ref 70–99)
Glucose-Capillary: 297 mg/dL — ABNORMAL HIGH (ref 70–99)
Glucose-Capillary: 383 mg/dL — ABNORMAL HIGH (ref 70–99)
Glucose-Capillary: 300 mg/dL — ABNORMAL HIGH (ref 70–99)
Glucose-Capillary: 223 mg/dL — ABNORMAL HIGH (ref 70–99)
Glucose-Capillary: 126 mg/dL — ABNORMAL HIGH (ref 70–99)
Glucose-Capillary: 134 mg/dL — ABNORMAL HIGH (ref 70–99)
Glucose-Capillary: 174 mg/dL — ABNORMAL HIGH (ref 70–99)
Glucose-Capillary: 160 mg/dL — ABNORMAL HIGH (ref 70–99)

## 2018-03-13 LAB — BETA-HYDROXYBUTYRIC ACID: Beta-Hydroxybutyric Acid: 0.44 mmol/L — ABNORMAL HIGH (ref 0.05–0.27)

## 2018-03-13 LAB — BASIC METABOLIC PANEL
Anion gap: 15 (ref 5–15)
Anion gap: 18 — ABNORMAL HIGH (ref 5–15)
Anion gap: 15 (ref 5–15)
Anion gap: 14 (ref 5–15)
Anion gap: 11 (ref 5–15)
BUN: 108 mg/dL — ABNORMAL HIGH (ref 8–23)
BUN: 113 mg/dL — ABNORMAL HIGH (ref 8–23)
BUN: 119 mg/dL — ABNORMAL HIGH (ref 8–23)
BUN: 99 mg/dL — ABNORMAL HIGH (ref 8–23)
BUN: 77 mg/dL — ABNORMAL HIGH (ref 8–23)
CO2: 12 mmol/L — ABNORMAL LOW (ref 22–32)
CO2: 9 mmol/L — ABNORMAL LOW (ref 22–32)
CO2: 14 mmol/L — ABNORMAL LOW (ref 22–32)
CO2: 15 mmol/L — ABNORMAL LOW (ref 22–32)
CO2: 21 mmol/L — ABNORMAL LOW (ref 22–32)
CREATININE: 5.17 mg/dL — AB (ref 0.44–1.00)
Calcium: 6.5 mg/dL — ABNORMAL LOW (ref 8.9–10.3)
Calcium: 6.8 mg/dL — ABNORMAL LOW (ref 8.9–10.3)
Calcium: 6.4 mg/dL — CL (ref 8.9–10.3)
Calcium: 6.9 mg/dL — ABNORMAL LOW (ref 8.9–10.3)
Calcium: 7.3 mg/dL — ABNORMAL LOW (ref 8.9–10.3)
Chloride: 111 mmol/L (ref 98–111)
Chloride: 109 mmol/L (ref 98–111)
Chloride: 109 mmol/L (ref 98–111)
Chloride: 111 mmol/L (ref 98–111)
Chloride: 107 mmol/L (ref 98–111)
Creatinine, Ser: 6.24 mg/dL — ABNORMAL HIGH (ref 0.44–1.00)
Creatinine, Ser: 6.59 mg/dL — ABNORMAL HIGH (ref 0.44–1.00)
Creatinine, Ser: 6.09 mg/dL — ABNORMAL HIGH (ref 0.44–1.00)
Creatinine, Ser: 3.88 mg/dL — ABNORMAL HIGH (ref 0.44–1.00)
GFR calc Af Amer: 7 mL/min — ABNORMAL LOW (ref >60)
GFR calc Af Amer: 7 mL/min — ABNORMAL LOW (ref >60)
GFR calc Af Amer: 9 mL/min — ABNORMAL LOW (ref >60)
GFR calc Af Amer: 13 mL/min — ABNORMAL LOW (ref >60)
GFR calc non Af Amer: 6 mL/min — ABNORMAL LOW (ref >60)
GFR calc non Af Amer: 6 mL/min — ABNORMAL LOW (ref >60)
GFR calc non Af Amer: 6 mL/min — ABNORMAL LOW (ref >60)
GFR calc non Af Amer: 8 mL/min — ABNORMAL LOW (ref >60)
GFR calc non Af Amer: 11 mL/min — ABNORMAL LOW (ref >60)
GFR, EST AFRICAN AMERICAN: 7 mL/min — AB (ref >60)
GLUCOSE: 331 mg/dL — AB (ref 70–99)
GLUCOSE: 246 mg/dL — AB (ref 70–99)
Glucose, Bld: 300 mg/dL — ABNORMAL HIGH (ref 70–99)
Glucose, Bld: 370 mg/dL — ABNORMAL HIGH (ref 70–99)
Glucose, Bld: 177 mg/dL — ABNORMAL HIGH (ref 70–99)
POTASSIUM: 4.3 mmol/L (ref 3.5–5.1)
Potassium: 5 mmol/L (ref 3.5–5.1)
Potassium: 5.2 mmol/L — ABNORMAL HIGH (ref 3.5–5.1)
Potassium: 4.9 mmol/L (ref 3.5–5.1)
Potassium: 4.4 mmol/L (ref 3.5–5.1)
SODIUM: 138 mmol/L (ref 135–145)
Sodium: 136 mmol/L (ref 135–145)
Sodium: 138 mmol/L (ref 135–145)
Sodium: 140 mmol/L (ref 135–145)
Sodium: 139 mmol/L (ref 135–145)

## 2018-03-13 LAB — CBC
HCT: 26.3 % — ABNORMAL LOW (ref 36.0–46.0)
Hemoglobin: 8.4 g/dL — ABNORMAL LOW (ref 12.0–15.0)
MCH: 30.5 pg (ref 26.0–34.0)
MCHC: 31.9 g/dL (ref 30.0–36.0)
MCV: 95.6 fL (ref 80.0–100.0)
NRBC: 0.7 % — AB (ref 0.0–0.2)
Platelets: 100 10*3/uL — ABNORMAL LOW (ref 150–400)
RBC: 2.75 MIL/uL — ABNORMAL LOW (ref 3.87–5.11)
RDW: 13.8 % (ref 11.5–15.5)
WBC: 13 10*3/uL — AB (ref 4.0–10.5)

## 2018-03-13 LAB — POCT I-STAT 3, ART BLOOD GAS (G3+)
Acid-base deficit: 11 mmol/L — ABNORMAL HIGH (ref 0.0–2.0)
Bicarbonate: 12.6 mmol/L — ABNORMAL LOW (ref 20.0–28.0)
O2 Saturation: 100 %
Patient temperature: 98
TCO2: 13 mmol/L — ABNORMAL LOW (ref 22–32)
pCO2 arterial: 23.1 mmHg — ABNORMAL LOW (ref 32.0–48.0)
pH, Arterial: 7.344 — ABNORMAL LOW (ref 7.350–7.450)
pO2, Arterial: 190 mmHg — ABNORMAL HIGH (ref 83.0–108.0)

## 2018-03-13 LAB — PHOSPHORUS: PHOSPHORUS: 7.8 mg/dL — AB (ref 2.5–4.6)

## 2018-03-13 MED ORDER — PRISMASOL BGK 4/2.5 32-4-2.5 MEQ/L REPLACEMENT SOLN
Status: DC
  Administered 2018-03-13 – 2018-03-14 (×2): via INTRAVENOUS_CENTRAL
  Filled 2018-03-13 (×9): qty 5000

## 2018-03-13 MED ORDER — FENTANYL CITRATE (PF) 100 MCG/2ML IJ SOLN
50.0000 ug | INTRAMUSCULAR | Status: DC | PRN
  Administered 2018-03-13 – 2018-03-14 (×4): 50 ug via INTRAVENOUS
  Filled 2018-03-13 (×4): qty 2

## 2018-03-13 MED ORDER — FERROUS SULFATE 300 (60 FE) MG/5ML PO SYRP
300.0000 mg | ORAL_SOLUTION | Freq: Two times a day (BID) | ORAL | Status: DC
  Administered 2018-03-13 – 2018-03-14 (×2): 300 mg
  Filled 2018-03-13 (×2): qty 5

## 2018-03-13 MED ORDER — INSULIN REGULAR(HUMAN) IN NACL 100-0.9 UT/100ML-% IV SOLN
INTRAVENOUS | Status: DC
  Administered 2018-03-13: 2.4 [IU]/h via INTRAVENOUS
  Administered 2018-03-14: 9.2 [IU]/h via INTRAVENOUS
  Filled 2018-03-13 (×2): qty 100

## 2018-03-13 MED ORDER — INSULIN ASPART 100 UNIT/ML ~~LOC~~ SOLN: 7.0000 [IU] | SUBCUTANEOUS | Status: DC

## 2018-03-13 MED ORDER — DEXTROSE-NACL 5-0.45 % IV SOLN
INTRAVENOUS | Status: DC
  Administered 2018-03-13: 19:00:00 via INTRAVENOUS

## 2018-03-13 MED ORDER — SODIUM CHLORIDE 0.9 % FOR CRRT
INTRAVENOUS_CENTRAL | Status: DC | PRN
  Filled 2018-03-13: qty 1000

## 2018-03-13 MED ORDER — ATROPINE SULFATE 1 MG/ML IJ SOLN
INTRAMUSCULAR | Status: AC
  Filled 2018-03-13: qty 1

## 2018-03-13 MED ORDER — ATROPINE SULFATE 1 MG/10ML IJ SOSY
PREFILLED_SYRINGE | INTRAMUSCULAR | Status: AC
  Administered 2018-03-13: 1 mg
  Filled 2018-03-13: qty 10

## 2018-03-13 MED ORDER — PANTOPRAZOLE SODIUM 40 MG PO PACK
40.0000 mg | PACK | Freq: Every day | ORAL | Status: DC
  Administered 2018-03-13 – 2018-03-16 (×4): 40 mg
  Filled 2018-03-13 (×5): qty 20

## 2018-03-13 MED ORDER — SODIUM CHLORIDE 0.9 % IV SOLN
INTRAVENOUS | Status: DC
  Administered 2018-03-13 – 2018-03-15 (×2): via INTRAVENOUS

## 2018-03-13 MED ORDER — HEPARIN SODIUM (PORCINE) 1000 UNIT/ML DIALYSIS
1000.0000 [IU] | INTRAMUSCULAR | Status: DC | PRN
  Administered 2018-03-14: 1200 [IU] via INTRAVENOUS_CENTRAL
  Filled 2018-03-13: qty 3
  Filled 2018-03-13 (×2): qty 6

## 2018-03-13 MED ORDER — ASPIRIN 81 MG PO CHEW
81.0000 mg | CHEWABLE_TABLET | Freq: Every day | ORAL | Status: DC
  Administered 2018-03-13 – 2018-03-16 (×4): 81 mg
  Filled 2018-03-13 (×5): qty 1

## 2018-03-13 MED ORDER — SODIUM CHLORIDE 0.9 % IV SOLN: INTRAVENOUS | Status: AC

## 2018-03-13 MED ORDER — SODIUM BICARBONATE 8.4 % IV SOLN
50.0000 meq | Freq: Once | INTRAVENOUS | Status: AC
  Administered 2018-03-13: 50 meq via INTRAVENOUS
  Filled 2018-03-13: qty 50

## 2018-03-13 MED ORDER — ATROPINE SULFATE 1 MG/10ML IJ SOSY
PREFILLED_SYRINGE | INTRAMUSCULAR | Status: AC
  Administered 2018-03-13: 10:00:00
  Filled 2018-03-13: qty 10

## 2018-03-13 MED ORDER — HEPARIN SODIUM (PORCINE) 1000 UNIT/ML IJ SOLN
3000.0000 [IU] | Freq: Once | INTRAMUSCULAR | Status: AC
  Administered 2018-03-13: 3000 [IU] via INTRAVENOUS
  Filled 2018-03-13: qty 3

## 2018-03-13 MED ORDER — PRISMASOL BGK 4/2.5 32-4-2.5 MEQ/L IV SOLN
INTRAVENOUS | Status: DC
  Administered 2018-03-13 – 2018-03-14 (×7): via INTRAVENOUS_CENTRAL
  Filled 2018-03-13 (×10): qty 5000

## 2018-03-13 NOTE — Progress Notes (Signed)
OT Cancellation Note  Patient Details Name: Yvonne Patel MRN: [id number] DOB: 1945/08/06   Cancelled Treatment:    Reason Eval/Treat Not Completed: Patient not medically ready;Active bedrest order. Femoral sheath in place.  Will follow and initiate OT eval when medically appropriate and able.   Delight Stare, OT Acute Rehabilitation Services Pager [phone] Office [phone]    Delight Stare 03/13/2018, 7:57 AM

## 2018-03-13 NOTE — Progress Notes (Signed)
[facility]    NEPHROLOGY PROGRESS NOTE  SUBJECTIVE: Worsening acidosis this morning.  Low blood pressures this morning.  Patient opens eyes and interacts with family.  Family meeting this morning to establish goals of care.  Family would like to continue full care in the short-term, since patient is continuing to interact with them.  They are aware that she may not have neurologic recovery, and reports that the patient would want to continue full care at this time.  It was established in the goals of care meeting that they would like to try dialysis in the short-term, but do not want to pursue long-term dialysis.    OBJECTIVE:  Vitals:   03/13/18 1108 03/13/18 1200  BP:    Pulse: 67   Resp: (!) 22   Temp:  99 F (37.2 C)  SpO2: 100%    I/O last 3 completed shifts: In: 2056.1 [I.V.:1476.1; NG/GT:580] Out: 430 [Urine:430] Blood pressure (!) 149/88, pulse 67, temperature 99 F (37.2 C), temperature source Axillary, resp. rate (!) 22, height '5\' 5"'  (1.651 m), weight 83.3 kg, SpO2 100 %.   Genearl: Awake, on the ventilator, no acute distress HEENT: MMM [place] AT anicteric sclera Neck:  No JVD, no adenopathy CV:  Heart RRR  Lungs: Bilateral rales Abd:  abd SNT/ND with normal BS GU:  Bladder non-palpable Extremities: +1 bilateral lower extremity edema Skin:  No skin rash  MEDICATIONS:   Current Facility-Administered Medications:  .   stroke: mapping our early stages of recovery book, , Does not apply, Once, Marliss Coots, PA-C .  acetaminophen (TYLENOL) tablet 650 mg, 650 mg, Oral, Q4H PRN **OR** acetaminophen (TYLENOL) solution 650 mg, 650 mg, Per Tube, Q4H PRN **OR** acetaminophen (TYLENOL) suppository 650 mg, 650 mg, Rectal, Q4H PRN, Marliss Coots, PA-C .  aspirin chewable tablet 81 mg, 81 mg, Per Tube, Daily, Salvadore Dom E, NP .  atorvastatin (LIPITOR) tablet 80 mg, 80 mg, Oral, q1800, Patwardhan, Manish J, MD, 80 mg at 03/12/18 1804 .  atropine 1 MG/ML  injection, , , ,  .  chlorhexidine gluconate (MEDLINE KIT) (PERIDEX) 0.12 % solution 15 mL, 15 mL, Mouth Rinse, BID, Amie Portland, MD, 15 mL at 03/13/18 0810 .  clopidogrel (PLAVIX) tablet 75 mg, 75 mg, Oral, QHS, Rosalin Hawking, MD, 75 mg at 03/12/18 2147 .  feeding supplement (PRO-STAT SUGAR FREE 64) liquid 30 mL, 30 mL, Per Tube, Daily, Rosalin Hawking, MD, 30 mL at 03/13/18 0926 .  feeding supplement (VITAL HIGH PROTEIN) liquid 1,000 mL, 1,000 mL, Per Tube, Q24H, Rosalin Hawking, MD, 1,000 mL at 03/12/18 1607 .  fentaNYL (SUBLIMAZE) injection 50 mcg, 50 mcg, Intravenous, Q1H PRN, Anders Simmonds, MD, 50 mcg at 03/13/18 0228 .  Ferrous Fumarate (HEMOCYTE - 106 mg FE) tablet 106 mg of iron, 1 tablet, Oral, BID WC, Rosalin Hawking, MD, 106 mg of iron at 03/13/18 0802 .  hydrALAZINE (APRESOLINE) injection 10 mg, 10 mg, Intravenous, Q6H PRN, Leonie Man, Pramod S, MD .  insulin aspart (novoLOG) injection 0-9 Units, 0-9 Units, Subcutaneous, Q4H, Rosalin Hawking, MD, 5 Units at 03/13/18 [phone] .  insulin aspart (novoLOG) injection 7 Units, 7 Units, Subcutaneous, Q4H, Babcock, Peter E, NP .  iohexol (OMNIPAQUE) 300 MG/ML solution 96 mL, 96 mL, Intravenous, Once PRN, Corrie Mckusick, DO .  MEDLINE mouth rinse, 15 mL, Mouth Rinse, 10 times per day, Amie Portland, MD, 15 mL at 03/13/18 0927 .  pantoprazole sodium (PROTONIX) 40 mg/20 mL oral suspension 40 mg, 40  mg, Per Tube, Q1200, Erick Colace, NP .  senna-docusate (Senokot-S) tablet 1 tablet, 1 tablet, Oral, QHS PRN, Marliss Coots, PA-C .  sodium bicarbonate injection 50 mEq, 50 mEq, Intravenous, Once, Jazzmin Newbold A, DO     LABS:  CBC Latest Ref Rng & Units 03/13/2018 03/12/2018 03/11/2018  WBC 4.0 - 10.5 K/uL 13.0(H) 13.1(H) 12.1(H)  Hemoglobin 12.0 - 15.0 g/dL 8.4(L) 8.9(L) 9.3(L)  Hematocrit 36.0 - 46.0 % 26.3(L) 27.3(L) 28.9(L)  Platelets 150 - 400 K/uL 100(L) 106(L) 116(L)    CMP Latest Ref Rng & Units 03/13/2018 03/13/2018 03/12/2018  Glucose 70 - 99 mg/dL 331(H)  300(H) 272(H)  BUN 8 - 23 mg/dL 113(H) 108(H) 83(H)  Creatinine 0.44 - 1.00 mg/dL 6.59(H) 6.24(H) 5.26(H)  Sodium 135 - 145 mmol/L 136 138 138  Potassium 3.5 - 5.1 mmol/L 5.2(H) 5.0 4.8  Chloride 98 - 111 mmol/L 109 111 111  CO2 22 - 32 mmol/L 9(L) 12(L) 15(L)  Calcium 8.9 - 10.3 mg/dL 6.8(L) 6.5(L) 7.0(L)  Total Protein 6.5 - 8.1 g/dL - - -  Total Bilirubin 0.3 - 1.2 mg/dL - - -  Alkaline Phos 38 - 126 U/L - - -  AST 15 - 41 U/L - - -  ALT 0 - 44 U/L - - -    Lab Results  Component Value Date   CALCIUM 6.8 (L) 03/13/2018   CAION 1.01 (L) 03/10/2018   PHOS 8.2 (H) 03/12/2018       Component Value Date/Time   COLORURINE YELLOW 07/21/2016 2243   APPEARANCEUR HAZY (A) 07/21/2016 2243   LABSPEC 1.011 07/21/2016 2243   PHURINE 6.0 07/21/2016 2243   GLUCOSEU NEGATIVE 07/21/2016 2243   HGBUR SMALL (A) 07/21/2016 2243   HGBUR trace-intact 12/18/2009 0836   BILIRUBINUR NEGATIVE 07/21/2016 2243   KETONESUR NEGATIVE 07/21/2016 2243   PROTEINUR 100 (A) 07/21/2016 2243   UROBILINOGEN 0.2 01/22/2012 1412   NITRITE NEGATIVE 07/21/2016 2243   LEUKOCYTESUR NEGATIVE 07/21/2016 2243      Component Value Date/Time   PHART 7.344 (L) 03/13/2018 1044   PCO2ART 23.1 (L) 03/13/2018 1044   PO2ART 190.0 (H) 03/13/2018 1044   HCO3 12.6 (L) 03/13/2018 1044   TCO2 13 (L) 03/13/2018 1044   ACIDBASEDEF 11.0 (H) 03/13/2018 1044   O2SAT 100.0 03/13/2018 1044       Component Value Date/Time   IRON 18 (L) 07/18/2016 0948   TIBC 207 (L) 07/18/2016 0948   FERRITIN 67 07/18/2016 0948   IRONPCTSAT 9 (L) 07/18/2016 0948       ASSESSMENT/PLAN:     Problem List Items Addressed This Visit      Cardiovascular and Mediastinum   CVA (cerebral vascular accident) ([place]) - Primary   Relevant Medications   clevidipine (CLEVIPREX) 0.5 MG/ML infusion (Completed)   atorvastatin (LIPITOR) tablet 80 mg   furosemide (LASIX) injection 40 mg (Completed)   labetalol (NORMODYNE,TRANDATE) injection 10 mg  (Completed)   hydrALAZINE (APRESOLINE) injection 10 mg   aspirin chewable tablet 81 mg (Start on 03/13/2018  6:00 PM)   Other Relevant Orders   IR Angiogram Extremity Bilateral (Completed)     Respiratory   Acute respiratory failure (HCC)   Relevant Orders   DG Chest Port 1 View (Completed)   DG Chest Port 1 View (Completed)   DG Chest Port 1 View (Completed)     Other   Endotracheal tube present   Relevant Orders   Portable Chest x-ray (Completed)    Other Visit Diagnoses  Stroke [facility])       Relevant Medications   clevidipine (CLEVIPREX) 0.5 MG/ML infusion (Completed)   atorvastatin (LIPITOR) tablet 80 mg   furosemide (LASIX) injection 40 mg (Completed)   labetalol (NORMODYNE,TRANDATE) injection 10 mg (Completed)   hydrALAZINE (APRESOLINE) injection 10 mg   aspirin chewable tablet 81 mg (Start on 03/13/2018  6:00 PM)   Other Relevant Orders   IR PERCUTANEOUS ART THROMBECTOMY/INFUSION INTRACRANIAL INC DIAG ANGIO (Completed)   IR US Guide Vasc Access Right (Completed)   IR Angiogram Extremity Bilateral (Completed)   IR Angiogram Extremity Bilateral (Completed)   Encounter for intubation       Relevant Orders   DG CHEST PORT 1 VIEW (Completed)   Encounter for orogastric (OG) tube placement       Relevant Orders   DG Abd 1 View (Completed)       1.  Chronic kidney disease stage IV with a baseline serum creatinine around 1.5 to 1.8 mg/dL.  I suspect this is on the basis of longstanding hypertension and diabetes.  2.  Acute kidney injury.  Likely on the basis of hemodynamics in the setting of an acute stroke, hypotension, and self extubation.  Attempt to avoid hypotension.  Check urinalysis for completeness.  Remains oliguric.    Lengthy discussion with the family today.  They would like to pursue dialysis.  They do not think the patient would like long-term dialysis.  I explained to them that it is likely, given her underlying chronic kidney disease, that she will not have  significant renal recovery.  Given the fact that she continues to interact with them, they would like to pursue short-term dialysis.  They are aware that dialysis will not improve her kidney function or her neurologic function.    3.  Hypertension.    Continue oral medications.  4.  Acute left MCA CVA.Marland Kitchen  No clear evidence of neurologic improvement but is awake and interactive with family.  Palliative care discussions ongoing.  Status post TPA and thrombectomy.    5.  Systolic congestive heart failure.  Continue goal-directed therapy for now, but keep ACE inhibitor on hold in the setting of acute kidney injury.  6.  Mixed gap and non-anion gap metabolic acidosis.  Likely combination of acute kidney injury.  Significant worsening of acidosis and hyperglycemia suggest a possible component of DKA.  Would add insulin therapy ? DKA - d/w PCM PA.  [place], DO, [place]

## 2018-03-13 NOTE — Progress Notes (Signed)
NAME:  Yvonne Patel, MRN:  [id number], DOB:  1945/11/15, LOS: 5 ADMISSION DATE:  04/05/2018, CONSULTATION DATE:  1/1 REFERRING MD:  Dr. Rory Percy , CHIEF COMPLAINT:  CVA   Brief History   73 year old female admitted with acute L MCA CVA s/p systemic TPA and mechanical thrombolysis.   Past Medical History  DM II TIA  Jehovah's witness > no blood products  HTN HLD CAD Cardiomyopathy  Significant Hospital Events   1/01 admit 1/03 CP, + troponin, Cardiology consulted.  Cleviprex, propofol.   1/04 Self extubated, reintubated for poor cough/airway protection 1/6: No improvement neurological exam, renal function worse, having bradycardic events. Consults:  Cardiology  Procedures:  VIR 1/1 >> procedure completed for left MCA occlusion at M1 segment. The procedure involved two passes of combination mechanical and aspiration thrombectomy with restoration of TICI 2b flow. L femoral art line 1/1 >> ETT 1/1 >>   Significant Diagnostic Tests:  CT head 1/1 > no acute finding, multiple remote small vessel infarcts CTA head/neck 1/1 > Left M2 branch occlusion beginning at the M1 bifurcation. Severe atherosclerosis which preferentially spares the left carotid circulation, question prior endarterectomy. Severe atheromatous narrowing if not occlusion of bilateral proximal subclavian arteries. There is bilateral fetal type PCA with diffuse thready flow in the posterior fossa. 70% atheromatous narrowing of the right common carotid and ICA bulb. CT head 1/1 (post IR) > New 3 mm high-density within the posterior right frontal cortex which could be focus of hemorrhage or an enhancing subacute infarct. No hemorrhage or infarct seen along the postoperative left MCA distribution. TTE 1/1 >> LVEF 30-35%, mod global & severe inferior / inferolateral hypokinesis,  MRI Brain 1/2 >> moderate left MCA infarct, punctate acute infarcts involving the left caudate, right centrum semiovale, parasagittal right parietal  cortex, bilateral cerebellar hemispheres.  4 mm hemorrhage right precentral gyrus.  Numerous microhemorrhages present basal ganglia both cerebral hemispheres, cerebellum. Advanced intracranial atherosclerosis, moderate ICA stenoses, chronic segmental occlusion basilar artery with decreased bilateral vertebral artery flow  Micro Data:  1/1 MRSA PCR  >> neg  Antimicrobials:  1/1 ancef preop  Interim history/subjective:  Trying to interact w/ family. RR increased. Bradycardic   Objective   Blood pressure (Abnormal) 149/88, pulse (Abnormal) 52, temperature 98 F (36.7 C), temperature source Axillary, resp. rate (Abnormal) 34, height 5\' 5"  (1.651 m), weight 83.3 kg, SpO2 96 %.    Vent Mode: PRVC FiO2 (%):  [40 %-50 %] 40 % Set Rate:  [16 bmp] 16 bmp Vt Set:  [450 mL] 450 mL PEEP:  [5 cmH20] 5 cmH20 Plateau Pressure:  [16 cmH20] 16 cmH20   Intake/Output Summary (Last 24 hours) at 03/13/2018 1004 Last data filed at 03/13/2018 0900 Gross per 24 hour  Intake 1072.31 ml  Output 230 ml  Net 842.31 ml   Filed Weights   03/10/2018 1600 03/12/18 0449 03/13/18 0500  Weight: 77.7 kg 88.2 kg 83.3 kg    Examination:  General 73 year old aaf currently still on full vent support HENT NCAT orally intubated. MMM Pulm CTA no accessory use  Card Bradycardic RRR; no MRG abd not tender + bowel sounds GU minimal UOP Neuro awake interacts w/ family right sided hemiparesis persists.   Resolved Hospital Problem list     Assessment & Plan:   Acute CVA:  Left M1/M2 distribution. S/p systemic TPA 1/1 at 1700, and Neuro IR thrombectomy.  Associated hemorrhage. P: continuing ICU monitoring CU monitoring  SBP goal 120-1 60 Serial  neuro checks  Acute respiratory insufficency related to above  -failed self extubation 1/5 P: Continuing full ventilator support ABG this a.m. Daily assessment for weaning, with metabolic status worse she is not a candidate for extubation from both a metabolic standpoint  and of course neurological standpoint Will need to discuss possible tracheostomy   Hypertension PAD -difficult to monitor BP due to severe subclavian stenosis P: Keep A-line in place  Discontinuing beta-blockade   Chest pain NSTEMI - troponin  peaked Systolic HF - TTE 1/1 with EF 30-35% P: Aspirin and Lipitor per cardiology  Bradycardia P: Repeat stat chemistry looking for hyperkalemia, also check ABG Continue telemetry monitoring Discontinue beta-blockade  Acute on chronic oliguric renal failure, suspect some component of hypoperfusion, dye load -Remains oliguric, renal function worse; 7.2 L positive -Seen by nephrology, baseline creatinine 1.5-1.8. P: KVO IV fluids Mean arterial pressure goal greater than 65 Close observation of chemistries We will need to discuss goals of care, will likely need dialysis soon Holding ACE inhibitor Daily chemistry  Fluid and electrolyte imbalance: Mild hyperkalemia P: Repeat chemistry stat Continue daily chemistries  DM2 P: ssi    Best practice:  Diet: NPO Pain/Anxiety/Delirium protocol (if indicated): propofol RASS goal 0 to -1 VAP protocol (if indicated): per protocol DVT prophylaxis: SCD GI prophylaxis: protonix Glucose control: ssi Mobility: BR Code Status: Full Family Communication:  Goals of care today. Will need to establish goals of care.    Critical care time:  33 minutes.      Erick Colace ACNP-BC [place] Pager # [phone] OR # [phone] if no answer

## 2018-03-13 NOTE — Procedures (Signed)
Central Venous dialysis Catheter Insertion Procedure Note Yvonne Patel [id number] Apr 03, 1945  Procedure: Insertion of Central Venous Catheter Indications: dialysis   Procedure Details Consent: Risks of procedure as well as the alternatives and risks of each were explained to the (patient/caregiver).  Consent for procedure obtained. Time Out: Verified patient identification, verified procedure, site/side was marked, verified correct patient position, special equipment/implants available, medications/allergies/relevent history reviewed, required imaging and test results available.  Performed Real time [place] used to ID and cannulate vessel   Maximum sterile technique was used including antiseptics, cap, gloves, gown, hand hygiene, mask and sheet. Skin prep: Chlorhexidine; local anesthetic administered A antimicrobial bonded/coated triple lumen catheter was placed in the right internal jugular vein using the Seldinger technique.  Evaluation Blood flow good Complications: No apparent complications Patient did tolerate procedure well. Chest X-ray ordered to verify placement.  CXR: pending.  Yvonne Patel 03/13/2018, 2:40 PM  Yvonne Patel ACNP-BC [place] Pager # [phone] OR # [phone] if no answer

## 2018-03-13 NOTE — Progress Notes (Addendum)
PT Cancellation Note  Patient Details Name: Yvonne Patel MRN: [id number] DOB: 1945/11/14   Cancelled Treatment:    Reason Eval/Treat Not Completed: Patient not medically ready. Pt remains on vent and medically regressing. Per RN family to have meeting regarding medical plan. Pt remains to also have a L femoral sheath.  Acute PT to return as able, as appropriate when/if family wants to proceed with therapy.  Kittie Plater, PT, DPT Acute Rehabilitation Services Pager #: [phone] Office #: [phone]    [place]line Lopes 03/13/2018, 10:36 AM

## 2018-03-13 NOTE — Progress Notes (Signed)
[facility] Kitchen[facility]OKE TEAM PROGRESS NOTE   SUBJECTIVE (INTERVAL HISTORY) Patient's condition continues to decline Renal function is worse and she is hyortensive and bradycardic this morning OBJECTIVE Temp:  [98 F (36.7 C)-99.5 F (37.5 C)] 99.1 F (37.3 C) (01/06 1600) Pulse Rate:  [52-70] 70 (01/06 1523) Cardiac Rhythm: Normal sinus rhythm;Sinus bradycardia (01/06 0800) Resp:  [19-34] 21 (01/06 1523) BP: (133-153)/(37-88) 149/88 (01/06 0809) SpO2:  [96 %-100 %] 97 % (01/06 1523) Arterial Line BP: (81-171)/(36-115) 131/44 (01/06 1200) FiO2 (%):  [40 %] 40 % (01/06 1523) Weight:  [83.3 kg] 83.3 kg (01/06 0500)  Recent Labs  Lab 03/13/18 0338 03/13/18 0750 03/13/18 1134 03/13/18 1452 03/13/18 1558  GLUCAP 256* 287* 306* 297* 383*   Recent Labs  Lab 03/11/18 0535 03/11/18 1245 03/11/18 1700 03/12/18 0453 03/12/18 1615 03/13/18 0513 03/13/18 1027 03/13/18 1531  NA 138  --   --  138  --  138 136 138  K 4.7  --   --  4.8  --  5.0 5.2* 4.9  CL 110  --   --  111  --  111 109 109  CO2 13*  --   --  15*  --  12* 9* 14*  GLUCOSE 188*  --   --  272*  --  300* 331* 370*  BUN 56*  --   --  83*  --  108* 113* 119*  CREATININE 4.68*  --   --  5.26*  --  6.24* 6.59* 6.09*  CALCIUM 7.6*  --   --  7.0*  --  6.5* 6.8* 6.4*  MG 2.3 2.2 2.2 2.3 2.4  --   --   --   PHOS 9.1* 8.2* 8.1* 7.5* 8.2*  --   --  7.8*   Recent Labs  Lab 03/22/2018 1656 03/09/18 1055 03/10/18 0503  AST 27  --   --   ALT 26  --   --   ALKPHOS 97  --   --   BILITOT 0.7  --   --   PROT 7.4  --   --   ALBUMIN 3.0* 2.1* 2.1*   Recent Labs  Lab 03/19/2018 1656 03/09/18 1040 03/10/18 0503 03/11/18 0535 03/12/18 0453 03/13/18 0513  WBC 6.3 6.6 8.4 12.1* 13.1* 13.0*  NEUTROABS 3.2  --   --   --   --   --   HGB 12.2 9.5* 9.0* 9.3* 8.9* 8.4*  HCT 38.5 30.1* 28.8* 28.9* 27.3* 26.3*  MCV 94.1 95.3 96.0 95.4 95.5 95.6  PLT 174 150 145* 116* 106* 100*   Recent Labs  Lab 03/09/18 0019 03/09/18 0432 03/09/18 1040  03/09/18 1526 03/09/18 2224  TROPONINI 6.47* 5.08* 4.70* 3.81* 3.88*   No results for input(s): LABPROT, INR in the last 72 hours. No results for input(s): COLORURINE, LABSPEC, [place], GLUCOSEU, HGBUR, BILIRUBINUR, KETONESUR, PROTEINUR, UROBILINOGEN, NITRITE, LEUKOCYTESUR in the last 72 hours.  Invalid input(s): APPERANCEUR     Component Value Date/Time   CHOL 141 03/09/2018 0432   TRIG 120 03/09/2018 0432   HDL 25 (L) 03/09/2018 0432   CHOLHDL 5.6 03/09/2018 0432   VLDL 24 03/09/2018 0432   LDLCALC 92 03/09/2018 0432   Lab Results  Component Value Date   HGBA1C 7.1 (H) 03/09/2018   No results found for: LABOPIA, COCAINSCRNUR, LABBENZ, AMPHETMU, THCU, LABBARB  No results for input(s): ETH in the last 168 hours.  I have personally reviewed the radiological images below and agree with the radiology interpretations.  Ct  Angio Head W Or Wo Contrast  Result Date: 03/11/2018 CLINICAL DATA:  Code stroke, deficit not specified EXAM: CT ANGIOGRAPHY HEAD AND NECK TECHNIQUE: Multidetector CT imaging of the head and neck was performed using the standard protocol during bolus administration of intravenous contrast. Multiplanar CT image reconstructions and MIPs were obtained to evaluate the vascular anatomy. Carotid stenosis measurements (when applicable) are obtained utilizing NASCET criteria, using the distal internal carotid diameter as the denominator. CONTRAST:  67mL ISOVUE-370 IOPAMIDOL (ISOVUE-370) INJECTION 76% COMPARISON:  None. FINDINGS: CTA NECK FINDINGS Aortic arch: Extensive atherosclerotic calcification. Three vessel branching Right carotid system: Diffuse mainly calcified atherosclerotic plaque which limits visualization of the lumen due to plaque blooming. There is distal common carotid stenosis that measures up to 70% (when compared to the more proximal vessel given the immediate downstream bifurcation). There is ICA bulb plaque with 70% stenosis. Left carotid system: Much less  extensive atherosclerotic plaque, question prior carotid endarterectomy. No stenosis or ulceration. Vertebral arteries: Severe bilateral proximal subclavian stenosis, with possible occlusion on the right. Narrowing on the left involves and extensive segment. No flow is seen within vertebral arteries until the V3 segments, there may be retrograde flow Skeleton: Diffuse degenerative disease.  No acute finding Other neck: Bilateral cataract resection. Upper chest: Hazy density of the lungs which could be from atelectasis. No Kerley lines to implicate edema. Review of the MIP images confirms the above findings CTA HEAD FINDINGS Anterior circulation: Confluent atherosclerotic calcification on the carotid siphons with small vessel size and plaque blooming limiting luminal visualization. There is a left M2 branch occlusion beginning at the M1 bifurcation with subsequent downstream reconstitution. No contralateral embolism is seen. Posterior circulation: Tiny vertebrobasilar arteries. There may be a basilar interruption from atherosclerosis or developmental variant. Fetal type flow to both posterior cerebral arteries which are symmetrically patent Venous sinuses: Patent as permitted by contrast timing Anatomic variants: As above Delayed phase: Not obtained in the emergent setting Review of the MIP images confirms the above findings Case discussed with Dr. Rory Percy at 5:04 p.m. IMPRESSION: 1. Left M2 branch occlusion beginning at the M1 bifurcation. 2. Severe atherosclerosis which preferentially spares the left carotid circulation, question prior endarterectomy. 3. Severe atheromatous narrowing if not occlusion of bilateral proximal subclavian arteries. There is bilateral fetal type PCA with diffuse thready flow in the posterior fossa. 4. 70% atheromatous narrowing of the right common carotid and ICA bulb. Electronically Signed   By: Monte Fantasia M.D.   On: 03/21/2018 17:16   Dg Abd 1 View  Result Date:  03/12/2018 CLINICAL DATA:  Orogastric tube placement.  Stroke. EXAM: ABDOMEN - 1 VIEW COMPARISON:  None. FINDINGS: Gastric decompression catheter extends into the stomach with the tip likely in the distal stomach near the antrum. No evidence of overt bowel obstruction or ileus. IMPRESSION: Orogastric tube extends into the stomach with the tip located in the distal stomach, likely near the antrum. Electronically Signed   By: Aletta Edouard M.D.   On: 03/12/2018 08:43   Ct Head Wo Contrast  Result Date: 03/11/2018 CLINICAL DATA:  Follow-up examination for acute stroke. EXAM: CT HEAD WITHOUT CONTRAST TECHNIQUE: Contiguous axial images were obtained from the base of the skull through the vertex without intravenous contrast. COMPARISON:  Comparison made with prior MRI from 03/09/2018 as well as CT from 03/15/2018. FINDINGS: Brain: There has been continued interval evolution of small to moderate sized left MCA territory infarct, overall stable in distribution as compared to previous MRI. Mild localized edema without  significant mass effect. Additional involving subcentimeter left cerebellar infarct noted as well. No evidence for hemorrhagic transformation or other complication. Previously noted additional punctate subcentimeter infarcts not well seen by CT. Subcentimeter hyperdensity involving the right frontal cortex slightly increased in size on today's exam measuring 5 mm, consistent with previously identified small hemorrhage. No significant mass effect or edema. No other evidence for new or interval infarction. No other acute large vessel territory infarct. Underlying atrophy with chronic microvascular ischemic disease noted, stable. Vascular: No hyperdense vessel. Calcified atherosclerosis noted at the skull base. Skull: Scalp soft tissues and calvarium within normal limits. Sinuses/Orbits: Globes and orbital soft tissues demonstrate no acute finding. Paranasal sinuses and mastoid air cells remain clear. Other:  None. IMPRESSION: 1. Normal expected interval evolution of small to moderate sized left MCA territory infarcts. No evidence for hemorrhagic transformation or other complication. Additional previously identified subcentimeter acute ischemic infarcts not well visualized by CT. 2. Slight interval increase in size of cortically based right frontal parenchymal hemorrhage, now measuring 5 mm (previously 3 mm). 3. No other new acute intracranial abnormality. Electronically Signed   By: Jeannine Boga M.D.   On: 03/11/2018 03:41   Ct Head Wo Contrast  Result Date: 03/25/2018 CLINICAL DATA:  Post thrombectomy EXAM: CT HEAD WITHOUT CONTRAST TECHNIQUE: Contiguous axial images were obtained from the base of the skull through the vertex without intravenous contrast. COMPARISON:  Head CT from earlier today FINDINGS: Brain: 3 mm focus of high density along the high and posterior right frontal convexity, on reformats this appears to be intraparenchymal. No hemorrhagic conversion or visible infarct in the area of procedure. Multiple remote small vessel infarcts. Vascular: Major vessels are symmetrically enhancing. Skull: Negative Sinuses/Orbits: Negative These results were called by telephone at the time of interpretation on 04/05/2018 at 8:17 pm to Dr. Cheral Marker , who verbally acknowledged these results. IMPRESSION: New 3 mm high-density within the posterior right frontal cortex which could be focus of hemorrhage or an enhancing subacute infarct. No hemorrhage or infarct seen along the postoperative left MCA distribution. Electronically Signed   By: Monte Fantasia M.D.   On: 03/24/2018 20:18   Ct Angio Neck W Or Wo Contrast  Result Date: 03/15/2018 CLINICAL DATA:  Code stroke, deficit not specified EXAM: CT ANGIOGRAPHY HEAD AND NECK TECHNIQUE: Multidetector CT imaging of the head and neck was performed using the standard protocol during bolus administration of intravenous contrast. Multiplanar CT image reconstructions and  MIPs were obtained to evaluate the vascular anatomy. Carotid stenosis measurements (when applicable) are obtained utilizing NASCET criteria, using the distal internal carotid diameter as the denominator. CONTRAST:  81mL ISOVUE-370 IOPAMIDOL (ISOVUE-370) INJECTION 76% COMPARISON:  None. FINDINGS: CTA NECK FINDINGS Aortic arch: Extensive atherosclerotic calcification. Three vessel branching Right carotid system: Diffuse mainly calcified atherosclerotic plaque which limits visualization of the lumen due to plaque blooming. There is distal common carotid stenosis that measures up to 70% (when compared to the more proximal vessel given the immediate downstream bifurcation). There is ICA bulb plaque with 70% stenosis. Left carotid system: Much less extensive atherosclerotic plaque, question prior carotid endarterectomy. No stenosis or ulceration. Vertebral arteries: Severe bilateral proximal subclavian stenosis, with possible occlusion on the right. Narrowing on the left involves and extensive segment. No flow is seen within vertebral arteries until the V3 segments, there may be retrograde flow Skeleton: Diffuse degenerative disease.  No acute finding Other neck: Bilateral cataract resection. Upper chest: Hazy density of the lungs which could be from atelectasis. No Awanda Mink  lines to implicate edema. Review of the MIP images confirms the above findings CTA HEAD FINDINGS Anterior circulation: Confluent atherosclerotic calcification on the carotid siphons with small vessel size and plaque blooming limiting luminal visualization. There is a left M2 branch occlusion beginning at the M1 bifurcation with subsequent downstream reconstitution. No contralateral embolism is seen. Posterior circulation: Tiny vertebrobasilar arteries. There may be a basilar interruption from atherosclerosis or developmental variant. Fetal type flow to both posterior cerebral arteries which are symmetrically patent Venous sinuses: Patent as permitted by  contrast timing Anatomic variants: As above Delayed phase: Not obtained in the emergent setting Review of the MIP images confirms the above findings Case discussed with Dr. Rory Percy at 5:04 p.m. IMPRESSION: 1. Left M2 branch occlusion beginning at the M1 bifurcation. 2. Severe atherosclerosis which preferentially spares the left carotid circulation, question prior endarterectomy. 3. Severe atheromatous narrowing if not occlusion of bilateral proximal subclavian arteries. There is bilateral fetal type PCA with diffuse thready flow in the posterior fossa. 4. 70% atheromatous narrowing of the right common carotid and ICA bulb. Electronically Signed   By: Monte Fantasia M.D.   On: 03/28/2018 17:16   Mr Jodene Nam Head Wo Contrast  Result Date: 03/09/2018 CLINICAL DATA:  Stroke follow-up. Status post endovascular revascularization of left MCA occlusion yesterday. EXAM: MRI HEAD WITHOUT CONTRAST MRA HEAD WITHOUT CONTRAST TECHNIQUE: Multiplanar, multiecho pulse sequences of the brain and surrounding structures were obtained without intravenous contrast. Angiographic images of the head were obtained using MRA technique without contrast. COMPARISON:  Head CT and CTA 03/24/2018. Brain MRI 07/20/2016. Head MRA 09/30/2011. FINDINGS: MRI HEAD FINDINGS Brain: There is a small to slightly moderate-sized acute left MCA infarct involving the insula, predominantly posterior frontal lobe as well as operculum, and small amount of the postcentral gyrus. There also punctate acute infarcts involving the left caudate nucleus, right centrum semiovale, parasagittal right parietal cortex, and likely left occipital white matter. There are also small acute infarcts in the left greater than right cerebellar hemispheres. Susceptibility artifact in the right precentral gyrus corresponds to the 4 mm acute hemorrhage on yesterday's CT with minimal surrounding edema. There are numerous chronic microhemorrhages clustered about the right insula and operculum  which are new from the 2018 MRI. Multiple additional chronic microhemorrhages are present in the basal ganglia bilaterally and scattered throughout both cerebral hemispheres and cerebellum. Patchy T2 hyperintensities in the cerebral white matter and pons have progressed from the prior MRI and are nonspecific but compatible with moderate chronic small-vessel ischemic disease. There are new chronic lacunar infarcts in the cerebral white matter, and there are chronic lacunar infarcts in the basal ganglia bilaterally. Small chronic infarcts in the cerebellar hemispheres have increased in number from the prior MRI. Mild cerebral atrophy is within normal limits for age. No mass, midline shift, or extra-axial fluid collection is identified. Vascular: Diminutive vertebrobasilar circulation, more fully evaluated below. Skull and upper cervical spine: Unremarkable bone marrow signal. Sinuses/Orbits: Bilateral cataract extraction. Paranasal sinuses and mastoid air cells are clear. Other: Partially visualized endotracheal and enteric tubes. MRA HEAD FINDINGS The distal vertebral arteries are diminutive with diminished flow most notable in the distal right V3 and V4 segments and with bilateral proximal vertebral artery occlusion shown on yesterday's neck CTA. Patent bilateral PICA, right AICA, and bilateral SCA origins are identified. There is a chronic lack of visualization of the mid basilar artery which appears patent more proximally and distally, and this may reflect segmental atherosclerotic occlusion or a developmental variant. There are  large posterior communicating arteries bilaterally with absence or severe hypoplasia of the right P1 segment. Both PCAs are patent without evidence of significant proximal stenosis. The internal carotid arteries are patent from skull base to carotid termini with extensive bilateral cavernous segment atherosclerotic irregularity resulting in mild stenosis on the right and moderate stenosis  on the left. 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 ACAs and MCAs are patent with branch vessel irregularity but no evidence of proximal branch occlusion. The left MCA trifurcation remains patent following thrombectomy although there is moderate stenosis of the inferior and mid M2 origins. There also mild proximal left M1 and left A1 stenoses. IMPRESSION: 1. Small to moderate-sized acute left MCA infarct. 2. Additional predominantly subcentimeter acute infarcts in the cerebral and cerebellar hemispheres bilaterally. 3. 4 mm acute hemorrhage in the right precentral gyrus as seen on CT yesterday. 4. Progressive chronic small vessel ischemia since 2018 with increased number of chronic microhemorrhages and chronic lacunar infarcts. 5. Advanced intracranial atherosclerosis. Revascularized left MCA bifurcation remains patent with moderate M2 origin stenoses. 6. Moderate ICA stenoses. 7. Diminished flow in the distal vertebral arteries with bilateral proximal occlusion shown on prior CTA. 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 8. 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 Electronically Signed   By: Logan Bores M.D.   On: 03/09/2018 17:17   Mr Brain Wo Contrast  Result Date: 03/09/2018 CLINICAL DATA:  Stroke follow-up. Status post endovascular revascularization of left MCA occlusion yesterday. EXAM: MRI HEAD WITHOUT CONTRAST MRA HEAD WITHOUT CONTRAST TECHNIQUE: Multiplanar, multiecho pulse sequences of the brain and surrounding structures were obtained without intravenous contrast. Angiographic images of the head were obtained using MRA technique without contrast. COMPARISON:  Head CT and CTA 03/13/2018. Brain MRI 07/20/2016. Head MRA 09/30/2011. FINDINGS: MRI  HEAD FINDINGS Brain: There is a small to slightly moderate-sized acute left MCA infarct involving the insula, predominantly posterior frontal lobe as well as operculum, and small amount of the postcentral gyrus. There also punctate acute infarcts involving the left caudate nucleus, right centrum semiovale, parasagittal right parietal cortex, and likely left occipital white matter. There are also small acute infarcts in the left greater than right cerebellar hemispheres. Susceptibility artifact in the right precentral gyrus corresponds to the 4 mm acute hemorrhage on yesterday's CT with minimal surrounding edema. There are numerous chronic microhemorrhages clustered about the right insula and operculum which are new from the 2018 MRI. Multiple additional chronic microhemorrhages are present in the basal ganglia bilaterally and scattered throughout both cerebral hemispheres and cerebellum. Patchy T2 hyperintensities in the cerebral white matter and pons have progressed from the prior MRI and are nonspecific but compatible with moderate chronic small-vessel ischemic disease. There are new chronic lacunar infarcts in the cerebral white matter, and there are chronic lacunar infarcts in the basal ganglia bilaterally. Small chronic infarcts in the cerebellar hemispheres have increased in number from the prior MRI. Mild cerebral atrophy is within normal limits for age. No mass, midline shift, or extra-axial fluid collection is identified. Vascular: Diminutive vertebrobasilar circulation, more fully evaluated below. Skull and upper cervical spine: Unremarkable bone marrow signal. Sinuses/Orbits: Bilateral cataract extraction. Paranasal sinuses and mastoid air cells are clear. Other: Partially visualized endotracheal and enteric tubes. MRA HEAD FINDINGS The distal vertebral arteries  are diminutive with diminished flow most notable in the distal right V3 and V4 segments and with bilateral proximal vertebral artery occlusion  shown on yesterday's neck CTA. Patent bilateral PICA, right AICA, and bilateral SCA origins are identified. There is a chronic lack of visualization of the mid basilar artery which appears patent more proximally and distally, and this may reflect segmental atherosclerotic occlusion or a developmental variant. There are large posterior communicating arteries bilaterally with absence or severe hypoplasia of the right P1 segment. Both PCAs are patent without evidence of significant proximal stenosis. The internal carotid arteries are patent from skull base to carotid termini with extensive bilateral cavernous segment atherosclerotic irregularity resulting in mild stenosis on the right and moderate stenosis on the left. 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 ACAs and MCAs are patent with branch vessel irregularity but no evidence of proximal branch occlusion. The left MCA trifurcation remains patent following thrombectomy although there is moderate stenosis of the inferior and mid M2 origins. There also mild proximal left M1 and left A1 stenoses. IMPRESSION: 1. Small to moderate-sized acute left MCA infarct. 2. Additional predominantly subcentimeter acute infarcts in the cerebral and cerebellar hemispheres bilaterally. 3. 4 mm acute hemorrhage in the right precentral gyrus as seen on CT yesterday. 4. Progressive chronic small vessel ischemia since 2018 with increased number of chronic microhemorrhages and chronic lacunar infarcts. 5. Advanced intracranial atherosclerosis. Revascularized left MCA bifurcation remains patent with moderate M2 origin stenoses. 6. Moderate ICA stenoses. 7. Diminished flow in the distal vertebral arteries with bilateral proximal occlusion shown on prior CTA. 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 8. 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 Electronically Signed   By: Logan Bores M.D.   On: 03/09/2018 17:17   Ir Angiogram Extremity Bilateral  Result Date: 03/26/2018 INDICATION: 73 year old female with a history of acute left MCA stroke. She presents within time frame for IV tPA, and is a candidate for mechanical thrombectomy EXAM: ULTRASOUND GUIDED ACCESS RIGHT COMMON FEMORAL ARTERY ULTRASOUND GUIDED ACCESS LEFT COMMON FEMORAL ARTERY FOR ARTERIAL MONITORING ANGIOGRAM RIGHT LOWER EXTREMITY BALLOON ANGIOPLASTY STENOSIS RIGHT COMMON ILIAC ARTERY IN ORDER TO PASS 8 FRENCH SHEATH TO COMPLETE THE THROMBECTOMY CEREBRAL ANGIOGRAM MECHANICAL THROMBECTOMY LEFT MCA EMERGENT LARGE VESSEL OCCLUSION DEPLOYMENT OF ANGIO-SEAL RIGHT COMMON FEMORAL ARTERY COMPARISON:  CT imaging of the same day MEDICATIONS: 2 g Ancef IV. The antibiotic was administered within 1 hour of the procedure ANESTHESIA/SEDATION: General endotracheal tube anesthesia CONTRAST:  96 cc Isovue-300 FLUOROSCOPY TIME:  Fluoroscopy Time: 26 minutes 24 seconds (1,362 mGy). COMPLICATIONS: None TECHNIQUE: Informed written consent was obtained from the patient's family after a thorough discussion of the procedural risks, benefits and alternatives. Specific risks discussed include: Bleeding, infection, contrast reaction, kidney injury/failure, need for further procedure/surgery, arterial injury or dissection, embolization to new territory, intracranial hemorrhage (10-15% risk), neurologic deterioration, cardiopulmonary collapse, death. All questions were addressed. Maximal Sterile Barrier Technique was utilized including during the procedure including caps, mask, sterile gowns, sterile gloves, sterile drape, hand hygiene and skin antiseptic. A timeout was performed prior to the initiation of the procedure. The anesthesia team was present to provide general endotracheal tube anesthesia and for  patient monitoring during the procedure. Interventional neuro radiology nursing staff was also present. FINDINGS: Initial Findings:  Left common carotid artery: No significant stenosis of the left common carotid artery, with a unremarkable course caliber and contour. Surgical changes of prior endarterectomy evident on prior CT imaging. Left external carotid artery: Patent with antegrade flow. Left internal carotid artery: Normal course caliber and contour of the cervical portion. Vertical and petrous segment patent with normal course caliber contour. Cavernous segment patent. Clinoid segment patent. Antegrade flow of the ophthalmic artery. Ophthalmic segment patent. Terminus patent. Left MCA: Distal M1 occluded at the initiation of the case. There is partial filling of an inferior branch which is the non dominant branch to the frontal. Patent fetal PCA to the left P2 segment. Left ACA: A 1 segment patent. A 2 segment perfuses the right territory. Completion Findings: Left MCA: After 2 passes of mechanical thrombectomy plus aspiration, there is near complete restoration of flow through the left MCA territory, with slow flow through an M3/M4 branch of the parietal region compatible with TI CI 2b flow. 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 Physical exam At the initiation of the case, palpable bilateral common femoral artery pulses present No palpable distal pulses Doppler positive pulses of the right posterior tibial and anterior tibial (with systolic blood pressures above 200) Doppler positive pulse at the left posterior tibial (with systolic blood pressure above 200), with no Doppler signal at the left anterior tibial At completion of case Doppler positive pulses were only discovered on the right posterior tibial and anterior tibial arteries with blood pressures above 200. In the 140-160 range (goal) Doppler signal was not evident A completion of case Doppler positive  pulses were only discovered on the left at the posterior tibial and not the left anterior tibial (with the pressure above 546 systolic). PROCEDURE: Patient is brought emergently to the neuro angiography suite, with the patient identified appropriately and placed supine position on the table. Left radial arterial line was attempted by the anesthesia team. The patient is then prepped and draped in the usual sterile fashion. Ultrasound survey of the right inguinal region was performed with images stored and sent to PACs. 11 blade scalpel was used to make a small incision. Blunt dissection was performed. A micropuncture needle was used access the right common femoral artery under ultrasound. With excellent arterial blood flow returned, an .018 micro wire was passed through the needle, observed to enter the abdominal aorta under fluoroscopy. The needle was removed, and a micropuncture sheath was placed over the wire. The inner dilator and wire were removed, and an 035 Bentson wire was advanced under fluoroscopy into the abdominal aorta. The sheath was removed and a standard 5 [place] vascular sheath was placed. The dilator was removed and the sheath was flushed. Ultrasound survey of the left inguinal region was then performed with images stored and sent to PACs, confirming patency of the vessel. A micropuncture needle was used access the left common femoral artery under ultrasound. With excellent arterial blood flow returned, and an .018 micro wire was passed through the needle, observed enter the abdominal aorta under fluoroscopy. The needle was removed, and a micropuncture sheath was placed over the wire. The inner dilator and wire were removed, and an 035 Bentson wire was advanced under fluoroscopy into the abdominal aorta. The sheath was removed and a standard 4 [place] vascular sheath was placed for arterial monitoring. The dilator was removed and the sheath was flushed. A 60F JB-1 diagnostic catheter was then advanced  over the wire through the existing right femoral access  to the proximal descending thoracic aorta. Wire was then removed. Double flush of the catheter was performed. Catheter was then used to select the left cervical internal carotid artery. Formal angiogram was performed, with roadmap achieved. Exchange length Rosen wire was then passed through the diagnostic catheter to the distal cervical ICA and the diagnostic catheter was removed. The 5 French sheath was removed, with attempt at placing 8 French 55 cm bright tip sheath. Significant resistance was encountered with passing the sheath through the right iliac system. The sheath would not advanced through the pre-existing right common iliac artery stent. Sheath was withdrawn and rotated. We attempted to advance the sheath over the introducer which was pinned on the wire. We also removed the introducer in place to diagnostic catheter into the aorta and attempted to pass the sheath over the diagnostic catheter. None of these maneuvers were successful. Sheath was then withdrawn into the external iliac artery. Balloon angioplasty was performed of the pre-existing right common iliac artery stent with 5 mm x 40 mm standard balloon angioplasty. Balloon was removed and the introducer was then replaced. Eight French sheath was then again attempted to pass over the wire, unsuccessful through this segment of the iliac artery. The introducer was removed, diagnostic catheter was placed into the cervical segment of the internal carotid artery, and the Rose an wire was exchanged for a 260 cm Amplatz wire. With the stiff wire in place, another attempt was made to pass the sheath which was unsuccessful. Sheath was then again withdrawn to the external iliac artery, introducer was removed, and a second balloon angioplasty 6 mm x 40 mm was performed at the pre-existing stent. The sheath was then successfully advanced over the balloon as the balloon was deflated, into the abdominal  aorta. The introducer was again placed after removing the balloon and the sheath was advanced 6 successfully into the aorta. Introducer was withdrawn. Once the sheath was advanced over the wire to the thoracic aorta, sheath was flushed and attached to pressurized and heparinized saline bag for constant forward flow. Eight French 95 cm flow gate balloon catheter was then advanced over the wire to the distal cervical segment. Wire was removed. Then a coaxial intermediate catheter and microcatheter combination was prepared on the back table. This combination was penumbra Ace 64 catheter and a Trevo Provue18 microcatheter, with a synchro soft wire. This combination was then advanced through the balloon guide into the ICA. System was advanced into the internal carotid artery, to the level of the occlusion. The micro wire was then carefully advanced through the occluded segment, with a distal knuckle configuration. Microcatheter was then pushed through the occluded segment and the wire was removed. Intermediate catheter was advanced over the micro wire to the carotid siphon, which would not advance beyond the ophthalmic segment through the terminus. Blood was then aspirated through the hub of the microcatheter, and a gentle contrast injection was performed confirming intraluminal position. A rotating hemostatic valve was then attached to the back end of the microcatheter, and a pressurized and heparinized saline bag was attached to the catheter. 4 mm x 40 mm solitaire device was then selected. Back flush was achieved at the rotating hemostatic valve, and then the device was gently advanced through the microcatheter to the distal end. The retriever was then unsheathed by withdrawing the microcatheter under fluoroscopy. Once the retriever was completely unsheathed, the microcatheter was carefully stripped from the delivery wire of the device. Control angiogram was then performed from the balloon  catheter. 3 minute time  interval was observed. The balloon on the balloon guide was then inflated to profile of the vessel. Constant aspiration was then performed through the intermediate catheter, and constant gentle aspiration was performed at the balloon guide. This aspiration was continued as the retriever was gently and slowly withdrawn with fluoroscopic observation into the distal intermediate catheter. The entire system was then gently withdrawn from the intracranial ICA and into the balloon guide. Once the retriever was entirely removed from the system, free aspiration was confirmed at the hub of the balloon guide, with free blood return confirmed. Control angiogram was then performed. TICI 2a flow was achieved. Repeat angiogram with multiple angulation demonstrated persistent occlusion of parietal branch. The same coaxial system was again passed through the balloon guide catheter. The microcatheter system was then advanced through the occlusion of the parietal branch. Once the micro wire microcatheter were beyond the occlusion, the micro wire was removed and stentreiver device was deployed, stripping the microcatheter from the wire. After the device was deployed across the occlusion, the intermediate catheter was again attempted to advanced to the M1 segment. This was unsuccessful, and would not advance beyond the ophthalmic segment. The balloon at the balloon guide was inflated to profile of the vessel. Local aspiration was performed at the intermediate catheter upon withdrawal of the device under fluoroscopic observation, with aspiration at the balloon guide. Once the retrieve her and intermediate catheter were entirely removed from the system, free aspiration was confirmed at the hub of the intermediate catheter, with free blood return confirmed. Control angiogram was again performed. Improved flow was confirmed, TICI 2b. Balloon guide was withdrawn and angiogram of the cervical segment was performed. We then removed the  balloon guide catheter and deployed an 8 French Angio-Seal at the right common femoral artery access. Angiogram was performed of the right sided access and the left-sided access before completing the case. Patient tolerated the procedure well and remained hemodynamically stable throughout. No complications were encountered. Estimated blood loss approximately 50 cc. IMPRESSION: Status post ultrasound guided access right common femoral artery for cerebral angiogram and a combination of aspiration and mechanical thrombectomy of left M1 emergent large vessel occlusion, and restoration of TICI 2b flow after 2 passes. Status post balloon angioplasty to 6 mm of proximal right common iliac artery for treatment of a restrictive lesion in order to pass 8 French sheath to perform the thrombectomy. Status post deployment of Angio-Seal at the right common femoral artery. At the conclusion of the case, a 4 [place] sheath remains in left common femoral artery for arterial monitoring. Signed, Dulcy Fanny. Dellia Nims, RPVI [facility] Radiology PLAN: Formal CT after the case Right hip straight overnight status post 8 French device and Angio-Seal closure Left common femoral artery for French sheath may be removed when hemodynamic monitoring is no longer needed with manual pressure. Goal blood pressure 161 [id number] systolic given the incomplete flow restoration Electronically Signed   By: Corrie Mckusick D.O.   On: 03/30/2018 20:39   Ir US Guide Vasc Access Right  Result Date: 03/31/2018 INDICATION: 73 year old female with a history of acute left MCA stroke. She presents within time frame for IV tPA, and is a candidate for mechanical thrombectomy EXAM: ULTRASOUND GUIDED ACCESS RIGHT COMMON FEMORAL ARTERY ULTRASOUND GUIDED ACCESS LEFT COMMON FEMORAL ARTERY FOR ARTERIAL MONITORING ANGIOGRAM RIGHT LOWER EXTREMITY BALLOON ANGIOPLASTY STENOSIS RIGHT COMMON ILIAC ARTERY IN ORDER TO PASS 8  FRENCH SHEATH TO COMPLETE THE THROMBECTOMY  CEREBRAL ANGIOGRAM MECHANICAL THROMBECTOMY LEFT MCA EMERGENT LARGE VESSEL OCCLUSION DEPLOYMENT OF ANGIO-SEAL RIGHT COMMON FEMORAL ARTERY COMPARISON:  CT imaging of the same day MEDICATIONS: 2 g Ancef IV. The antibiotic was administered within 1 hour of the procedure ANESTHESIA/SEDATION: General endotracheal tube anesthesia CONTRAST:  96 cc [id number] FLUOROSCOPY TIME:  Fluoroscopy Time: 26 minutes 24 seconds (1,362 mGy). COMPLICATIONS: None TECHNIQUE: Informed written consent was obtained from the patient's family after a thorough discussion of the procedural risks, benefits and alternatives. Specific risks discussed include: Bleeding, infection, contrast reaction, kidney injury/failure, need for further procedure/surgery, arterial injury or dissection, embolization to new territory, intracranial hemorrhage (10-15% risk), neurologic deterioration, cardiopulmonary collapse, death. All questions were addressed. Maximal Sterile Barrier Technique was utilized including during the procedure including caps, mask, sterile gowns, sterile gloves, sterile drape, hand hygiene and skin antiseptic. A timeout was performed prior to the initiation of the procedure. The anesthesia team was present to provide general endotracheal tube anesthesia and for patient monitoring during the procedure. Interventional neuro radiology nursing staff was also present. 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 Surgical changes of prior endarterectomy evident on prior CT imaging. Left external carotid artery: Patent with antegrade flow. Left internal carotid artery: Normal course caliber and contour of the cervical portion. Vertical and petrous segment patent with normal course caliber contour. Cavernous segment patent. Clinoid segment patent. Antegrade flow of the ophthalmic artery.  Ophthalmic segment patent. Terminus patent. Left MCA: Distal M1 occluded at the initiation of the case. There is partial filling of an inferior branch which is the non dominant branch to the frontal. Patent fetal PCA to the left P2 segment. Left ACA: A 1 segment patent. A 2 segment perfuses the right territory. Completion Findings: Left MCA: After 2 passes of mechanical thrombectomy plus aspiration, there is near complete restoration of flow through the left MCA territory, with slow flow through an M3/M4 branch of the parietal region compatible with TI CI 2b flow. 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 Physical exam At the initiation of the case, palpable bilateral common femoral artery pulses present No palpable distal pulses Doppler positive pulses of the right posterior tibial and anterior tibial (with systolic blood pressures above 200) Doppler positive pulse at the left posterior tibial (with systolic blood pressure above 200), with no Doppler signal at the left anterior tibial At completion of case Doppler positive pulses were only discovered on the right posterior tibial and anterior tibial arteries with blood pressures above 200. In the 140-160 range (goal) Doppler signal was not evident A completion of case Doppler positive pulses were only discovered on the left at the posterior tibial and not the left anterior tibial (with the pressure above 951 systolic). PROCEDURE: Patient is brought emergently to the neuro angiography suite, with the patient identified appropriately and placed supine position on the table. Left radial arterial line was attempted by the anesthesia team. The patient is then prepped and draped in the usual sterile fashion. Ultrasound survey of the right inguinal region was performed with images stored and sent to PACs. 11 blade scalpel was used to make a small incision. Blunt dissection was performed. A micropuncture needle was used access the  right common femoral artery under ultrasound. With excellent arterial blood flow returned, an .018 micro wire was passed through the needle, observed to enter the abdominal aorta under fluoroscopy. The needle was removed, and  a micropuncture sheath was placed over the wire. The inner dilator and wire were removed, and an 035 Bentson wire was advanced under fluoroscopy into the abdominal aorta. The sheath was removed and a standard 5 [place] vascular sheath was placed. The dilator was removed and the sheath was flushed. Ultrasound survey of the left inguinal region was then performed with images stored and sent to PACs, confirming patency of the vessel. A micropuncture needle was used access the left common femoral artery under ultrasound. With excellent arterial blood flow returned, and an .018 micro wire was passed through the needle, observed enter the abdominal aorta under fluoroscopy. The needle was removed, and a micropuncture sheath was placed over the wire. The inner dilator and wire were removed, and an 035 Bentson wire was advanced under fluoroscopy into the abdominal aorta. The sheath was removed and a standard 4 [place] vascular sheath was placed for arterial monitoring. The dilator was removed and the sheath was flushed. A 49F JB-1 diagnostic catheter was then advanced over the wire through the existing right femoral access to the proximal descending thoracic aorta. Wire was then removed. Double flush of the catheter was performed. Catheter was then used to select the left cervical internal carotid artery. Formal angiogram was performed, with roadmap achieved. Exchange length Rosen wire was then passed through the diagnostic catheter to the distal cervical ICA and the diagnostic catheter was removed. The 5 French sheath was removed, with attempt at placing 8 French 55 cm bright tip sheath. Significant resistance was encountered with passing the sheath through the right iliac system. The sheath would not  advanced through the pre-existing right common iliac artery stent. Sheath was withdrawn and rotated. We attempted to advance the sheath over the introducer which was pinned on the wire. We also removed the introducer in place to diagnostic catheter into the aorta and attempted to pass the sheath over the diagnostic catheter. None of these maneuvers were successful. Sheath was then withdrawn into the external iliac artery. Balloon angioplasty was performed of the pre-existing right common iliac artery stent with 5 mm x 40 mm standard balloon angioplasty. Balloon was removed and the introducer was then replaced. Eight French sheath was then again attempted to pass over the wire, unsuccessful through this segment of the iliac artery. The introducer was removed, diagnostic catheter was placed into the cervical segment of the internal carotid artery, and the Rose an wire was exchanged for a 260 cm Amplatz wire. With the stiff wire in place, another attempt was made to pass the sheath which was unsuccessful. Sheath was then again withdrawn to the external iliac artery, introducer was removed, and a second balloon angioplasty 6 mm x 40 mm was performed at the pre-existing stent. The sheath was then successfully advanced over the balloon as the balloon was deflated, into the abdominal aorta. The introducer was again placed after removing the balloon and the sheath was advanced 6 successfully into the aorta. Introducer was withdrawn. Once the sheath was advanced over the wire to the thoracic aorta, sheath was flushed and attached to pressurized and heparinized saline bag for constant forward flow. Eight French 95 cm flow gate balloon catheter was then advanced over the wire to the distal cervical segment. Wire was removed. Then a coaxial intermediate catheter and microcatheter combination was prepared on the back table. This combination was penumbra Ace 64 catheter and a Trevo Provue18 microcatheter, with a synchro soft  wire. This combination was then advanced through the balloon guide into  the ICA. System was advanced into the internal carotid artery, to the level of the occlusion. The micro wire was then carefully advanced through the occluded segment, with a distal knuckle configuration. Microcatheter was then pushed through the occluded segment and the wire was removed. Intermediate catheter was advanced over the micro wire to the carotid siphon, which would not advance beyond the ophthalmic segment through the terminus. Blood was then aspirated through the hub of the microcatheter, and a gentle contrast injection was performed confirming intraluminal position. A rotating hemostatic valve was then attached to the back end of the microcatheter, and a pressurized and heparinized saline bag was attached to the catheter. 4 mm x 40 mm solitaire device was then selected. Back flush was achieved at the rotating hemostatic valve, and then the device was gently advanced through the microcatheter to the distal end. The retriever was then unsheathed by withdrawing the microcatheter under fluoroscopy. Once the retriever was completely unsheathed, the microcatheter was carefully stripped from the delivery wire of the device. Control angiogram was then performed from the balloon catheter. 3 minute time interval was observed. The balloon on the balloon guide was then inflated to profile of the vessel. Constant aspiration was then performed through the intermediate catheter, and constant gentle aspiration was performed at the balloon guide. This aspiration was continued as the retriever was gently and slowly withdrawn with fluoroscopic observation into the distal intermediate catheter. The entire system was then gently withdrawn from the intracranial ICA and into the balloon guide. Once the retriever was entirely removed from the system, free aspiration was confirmed at the hub of the balloon guide, with free blood return confirmed. Control  angiogram was then performed. TICI 2a flow was achieved. Repeat angiogram with multiple angulation demonstrated persistent occlusion of parietal branch. The same coaxial system was again passed through the balloon guide catheter. The microcatheter system was then advanced through the occlusion of the parietal branch. Once the micro wire microcatheter were beyond the occlusion, the micro wire was removed and stentreiver device was deployed, stripping the microcatheter from the wire. After the device was deployed across the occlusion, the intermediate catheter was again attempted to advanced to the M1 segment. This was unsuccessful, and would not advance beyond the ophthalmic segment. The balloon at the balloon guide was inflated to profile of the vessel. Local aspiration was performed at the intermediate catheter upon withdrawal of the device under fluoroscopic observation, with aspiration at the balloon guide. Once the retrieve her and intermediate catheter were entirely removed from the system, free aspiration was confirmed at the hub of the intermediate catheter, with free blood return confirmed. Control angiogram was again performed. Improved flow was confirmed, TICI 2b. Balloon guide was withdrawn and angiogram of the cervical segment was performed. We then removed the balloon guide catheter and deployed an 8 French Angio-Seal at the right common femoral artery access. Angiogram was performed of the right sided access and the left-sided access before completing the case. Patient tolerated the procedure well and remained hemodynamically stable throughout. No complications were encountered. Estimated blood loss approximately 50 cc. IMPRESSION: Status post ultrasound guided access right common femoral artery for cerebral angiogram and a combination of aspiration and mechanical thrombectomy of left M1 emergent large vessel occlusion, and restoration of TICI 2b flow after 2 passes. Status post balloon angioplasty to  6 mm of proximal right common iliac artery for treatment of a restrictive lesion in order to pass 8 French sheath to perform the thrombectomy. Status  post deployment of Angio-Seal at the right common femoral artery. At the conclusion of the case, a 4 [place] sheath remains in left common femoral artery for arterial monitoring. Signed, Dulcy Fanny. Dellia Nims, RPVI [facility] Inter[facility] Radiology Specialists [facility] Rad[facility] PLAN: Formal CT after the case Right hip straight overnight status post 8 French device and Angio-Seal closure Left common femoral artery for French sheath may be removed when hemodynamic monitoring is no longer needed with manual pressure. Goal blood pressure 643 [id number] systolic given the incomplete flow restoration Electronically Signed   By: Corrie Mckusick D.O.   On: 04/07/2018 20:39   Dg Chest Port 1 View  Result Date: 03/13/2018 CLINICAL DATA:  Central line placement EXAM: PORTABLE CHEST 1 VIEW COMPARISON:  Chest radiograph from earlier today. FINDINGS: Endotracheal tube tip is 1.8 cm above the carina. Enteric tube enters stomach with the tip not seen on this image. Right internal jugular central venous catheter terminates in the middle third of the SVC. Stable cardiomediastinal silhouette with mild cardiomegaly. No pneumothorax. No pleural effusion. Hazy parahilar lung opacities are stable. IMPRESSION: 1. Right internal jugular central venous catheter terminates in the middle third of the SVC. No pneumothorax. 2. Endotracheal tube tip 1.8 cm above the carina. 3. Stable mild cardiomegaly. Stable hazy parahilar lung opacities, favor pulmonary edema. Electronically Signed   By: Ilona Sorrel M.D.   On: 03/13/2018 15:14   Dg Chest Port 1 View  Result Date: 03/13/2018 CLINICAL DATA:  Endotracheal tube EXAM: PORTABLE CHEST 1 VIEW COMPARISON:  03/12/2018 FINDINGS: The heart is moderately enlarged. NG tube is stable. Endotracheal tube has been retracted and the tip is now 3.2 cm from  the carina. There is hazy pulmonary opacity bilaterally which is central and basilar likely combination of pleural fluid and airspace disease. No pneumothorax. Vascular congestion. IMPRESSION: Stable bilateral pleural effusions and bilateral hazy airspace disease. Electronically Signed   By: Marybelle Killings M.D.   On: 03/13/2018 07:47   Dg Chest Port 1 View  Result Date: 03/12/2018 CLINICAL DATA:  Endotracheal tube placement. EXAM: PORTABLE CHEST 1 VIEW COMPARISON:  Chest radiograph performed 03/11/2018 FINDINGS: The patient's endotracheal tube is seen ending 1-2 cm above the carina. This could be retracted 2 cm. The patient's enteric tube is noted extending below the diaphragm. Small bilateral pleural effusions are noted. Hazy bilateral airspace opacities raise concern for pulmonary edema. No pneumothorax is seen. The cardiomediastinal silhouette is mildly enlarged. No acute osseous abnormalities are identified. IMPRESSION: 1. Endotracheal tube seen ending 1-2 cm above the carina. This could be retracted 2 cm. 2. Small bilateral pleural effusions noted. Hazy bilateral airspace opacities raise concern for pulmonary edema. 3. Mild cardiomegaly. Electronically Signed   By: Garald Balding M.D.   On: 03/12/2018 06:38   Dg Chest Port 1 View  Result Date: 03/11/2018 CLINICAL DATA:  Acute respiratory failure with hypoxia. EXAM: PORTABLE CHEST 1 VIEW COMPARISON:  03/10/2018 FINDINGS: Endotracheal tube has tip 5.6 cm above the carina. Nasogastric tube unchanged with tip over the stomach in the left upper quadrant. Lungs are adequately inflated with minimal persistent left retrocardiac opacification. Mild stable cardiomegaly. Remainder of the exam is unchanged. IMPRESSION: Mild left retrocardiac opacification unchanged likely atelectasis. Mild stable cardiomegaly. Tubes and lines as described. Electronically Signed   By: Marin Olp M.D.   On: 03/11/2018 08:17   Dg Chest Port 1 View  Result Date: 03/10/2018 CLINICAL  DATA:  Cerebral infarction and respiratory failure. EXAM: PORTABLE CHEST 1 VIEW COMPARISON:  03/27/2018 FINDINGS: Endotracheal tube remains present with the tip approximately 3 cm above the carina. Gastric decompression tube has been advanced and now extends further into the stomach. Stable significant cardiac enlargement. Lungs show some improved aeration at the right base with mild residual right basilar atelectasis remaining. Left lower lobe atelectasis is relatively stable. No pulmonary edema, significant pleural fluid or pneumothorax. IMPRESSION: Advancement of gastric decompression tube further into the stomach. Improved aeration at the right lung base with residual mild right basilar atelectasis. Stable left lower lobe atelectasis. Electronically Signed   By: Aletta Edouard M.D.   On: 03/10/2018 08:18   Portable Chest X-ray  Result Date: 04/04/2018 CLINICAL DATA:  Intubated EXAM: PORTABLE CHEST 1 VIEW COMPARISON:  06/01/2017 chest radiograph. FINDINGS: Endotracheal tube tip is 4.2 cm above the carina. Enteric tube terminates at the esophagogastric junction with the side port in the lower thoracic esophagus. Stable cardiomediastinal silhouette with moderate cardiomegaly. No pneumothorax. No pleural effusion. Hazy lower parahilar lung opacities, most prominent in the right lower lung, worsened. IMPRESSION: 1. Well-positioned endotracheal tube. 2. Enteric tube terminates at the esophagogastric junction with the side port in the lower thoracic esophagus, consider advancing 8-10 cm. 3. Moderate cardiomegaly. Worsening hazy lower parahilar lung opacities, most prominent in the right lower lung, favor pulmonary edema. Electronically Signed   By: Ilona Sorrel M.D.   On: 03/12/2018 22:12   Ir Percutaneous Art Thrombectomy/infusion Intracranial Inc Diag Angio  Result Date: 03/09/2018 INDICATION: 73 year old female with a history of acute left MCA stroke. She presents within time frame for IV tPA, and is a  candidate for mechanical thrombectomy EXAM: ULTRASOUND GUIDED ACCESS RIGHT COMMON FEMORAL ARTERY ULTRASOUND GUIDED ACCESS LEFT COMMON FEMORAL ARTERY FOR ARTERIAL MONITORING ANGIOGRAM RIGHT LOWER EXTREMITY BALLOON ANGIOPLASTY STENOSIS RIGHT COMMON ILIAC ARTERY IN ORDER TO PASS 8 FRENCH SHEATH TO COMPLETE THE THROMBECTOMY CEREBRAL ANGIOGRAM MECHANICAL THROMBECTOMY LEFT MCA EMERGENT LARGE VESSEL OCCLUSION DEPLOYMENT OF ANGIO-SEAL RIGHT COMMON FEMORAL ARTERY COMPARISON:  CT imaging of the same day MEDICATIONS: 2 g Ancef IV. The antibiotic was administered within 1 hour of the procedure ANESTHESIA/SEDATION: General endotracheal tube anesthesia CONTRAST:  96 cc Isovue-300 FLUOROSCOPY TIME:  Fluoroscopy Time: 26 minutes 24 seconds (1,362 mGy). COMPLICATIONS: None TECHNIQUE: Informed written consent was obtained from the patient's family after a thorough discussion of the procedural risks, benefits and alternatives. Specific risks discussed include: Bleeding, infection, contrast reaction, kidney injury/failure, need for further procedure/surgery, arterial injury or dissection, embolization to new territory, intracranial hemorrhage (10-15% risk), neurologic deterioration, cardiopulmonary collapse, death. All questions were addressed. Maximal Sterile Barrier Technique was utilized including during the procedure including caps, mask, sterile gowns, sterile gloves, sterile drape, hand hygiene and skin antiseptic. A timeout was performed prior to the initiation of the procedure. The anesthesia team was present to provide general endotracheal tube anesthesia and for patient monitoring during the procedure. Interventional neuro radiology nursing staff was also present. 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 Surgical changes of prior endarterectomy evident on prior CT imaging. Left external carotid artery:  Patent with antegrade flow. Left internal carotid artery: Normal course caliber and contour of the cervical portion. Vertical and petrous segment patent with normal course caliber contour. Cavernous segment patent. Clinoid segment patent. Antegrade flow of the ophthalmic artery. Ophthalmic segment patent. Terminus patent. Left MCA: Distal M1 occluded at the initiation of the case. There is partial filling of an inferior branch which  is the non dominant branch to the frontal. Patent fetal PCA to the left P2 segment. Left ACA: A 1 segment patent. A 2 segment perfuses the right territory. Completion Findings: Left MCA: After 2 passes of mechanical thrombectomy plus aspiration, there is near complete restoration of flow through the left MCA territory, with slow flow through an M3/M4 branch of the parietal region compatible with TI CI 2b flow. 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 Physical exam At the initiation of the case, palpable bilateral common femoral artery pulses present No palpable distal pulses Doppler positive pulses of the right posterior tibial and anterior tibial (with systolic blood pressures above 200) Doppler positive pulse at the left posterior tibial (with systolic blood pressure above 200), with no Doppler signal at the left anterior tibial At completion of case Doppler positive pulses were only discovered on the right posterior tibial and anterior tibial arteries with blood pressures above 200. In the 140-160 range (goal) Doppler signal was not evident A completion of case Doppler positive pulses were only discovered on the left at the posterior tibial and not the left anterior tibial (with the pressure above 025 systolic). PROCEDURE: Patient is brought emergently to the neuro angiography suite, with the patient identified appropriately and placed supine position on the table. Left radial arterial line was attempted by the anesthesia team. The patient is  then prepped and draped in the usual sterile fashion. Ultrasound survey of the right inguinal region was performed with images stored and sent to PACs. 11 blade scalpel was used to make a small incision. Blunt dissection was performed. A micropuncture needle was used access the right common femoral artery under ultrasound. With excellent arterial blood flow returned, an .018 micro wire was passed through the needle, observed to enter the abdominal aorta under fluoroscopy. The needle was removed, and a micropuncture sheath was placed over the wire. The inner dilator and wire were removed, and an 035 Bentson wire was advanced under fluoroscopy into the abdominal aorta. The sheath was removed and a standard 5 [place] vascular sheath was placed. The dilator was removed and the sheath was flushed. Ultrasound survey of the left inguinal region was then performed with images stored and sent to PACs, confirming patency of the vessel. A micropuncture needle was used access the left common femoral artery under ultrasound. With excellent arterial blood flow returned, and an .018 micro wire was passed through the needle, observed enter the abdominal aorta under fluoroscopy. The needle was removed, and a micropuncture sheath was placed over the wire. The inner dilator and wire were removed, and an 035 Bentson wire was advanced under fluoroscopy into the abdominal aorta. The sheath was removed and a standard 4 [place] vascular sheath was placed for arterial monitoring. The dilator was removed and the sheath was flushed. A 42F JB-1 diagnostic catheter was then advanced over the wire through the existing right femoral access to the proximal descending thoracic aorta. Wire was then removed. Double flush of the catheter was performed. Catheter was then used to select the left cervical internal carotid artery. Formal angiogram was performed, with roadmap achieved. Exchange length Rosen wire was then passed through the diagnostic catheter  to the distal cervical ICA and the diagnostic catheter was removed. The 5 French sheath was removed, with attempt at placing 8 French 55 cm bright tip sheath. Significant resistance was encountered with passing the sheath through the right iliac system. The sheath would not advanced through the pre-existing right common  iliac artery stent. Sheath was withdrawn and rotated. We attempted to advance the sheath over the introducer which was pinned on the wire. We also removed the introducer in place to diagnostic catheter into the aorta and attempted to pass the sheath over the diagnostic catheter. None of these maneuvers were successful. Sheath was then withdrawn into the external iliac artery. Balloon angioplasty was performed of the pre-existing right common iliac artery stent with 5 mm x 40 mm standard balloon angioplasty. Balloon was removed and the introducer was then replaced. Eight French sheath was then again attempted to pass over the wire, unsuccessful through this segment of the iliac artery. The introducer was removed, diagnostic catheter was placed into the cervical segment of the internal carotid artery, and the Rose an wire was exchanged for a 260 cm Amplatz wire. With the stiff wire in place, another attempt was made to pass the sheath which was unsuccessful. Sheath was then again withdrawn to the external iliac artery, introducer was removed, and a second balloon angioplasty 6 mm x 40 mm was performed at the pre-existing stent. The sheath was then successfully advanced over the balloon as the balloon was deflated, into the abdominal aorta. The introducer was again placed after removing the balloon and the sheath was advanced 6 successfully into the aorta. Introducer was withdrawn. Once the sheath was advanced over the wire to the thoracic aorta, sheath was flushed and attached to pressurized and heparinized saline bag for constant forward flow. Eight French 95 cm flow gate balloon catheter was then  advanced over the wire to the distal cervical segment. Wire was removed. Then a coaxial intermediate catheter and microcatheter combination was prepared on the back table. This combination was penumbra Ace 64 catheter and a Trevo Provue18 microcatheter, with a synchro soft wire. This combination was then advanced through the balloon guide into the ICA. System was advanced into the internal carotid artery, to the level of the occlusion. The micro wire was then carefully advanced through the occluded segment, with a distal knuckle configuration. Microcatheter was then pushed through the occluded segment and the wire was removed. Intermediate catheter was advanced over the micro wire to the carotid siphon, which would not advance beyond the ophthalmic segment through the terminus. Blood was then aspirated through the hub of the microcatheter, and a gentle contrast injection was performed confirming intraluminal position. A rotating hemostatic valve was then attached to the back end of the microcatheter, and a pressurized and heparinized saline bag was attached to the catheter. 4 mm x 40 mm solitaire device was then selected. Back flush was achieved at the rotating hemostatic valve, and then the device was gently advanced through the microcatheter to the distal end. The retriever was then unsheathed by withdrawing the microcatheter under fluoroscopy. Once the retriever was completely unsheathed, the microcatheter was carefully stripped from the delivery wire of the device. Control angiogram was then performed from the balloon catheter. 3 minute time interval was observed. The balloon on the balloon guide was then inflated to profile of the vessel. Constant aspiration was then performed through the intermediate catheter, and constant gentle aspiration was performed at the balloon guide. This aspiration was continued as the retriever was gently and slowly withdrawn with fluoroscopic observation into the distal  intermediate catheter. The entire system was then gently withdrawn from the intracranial ICA and into the balloon guide. Once the retriever was entirely removed from the system, free aspiration was confirmed at the hub of the balloon guide, with  free blood return confirmed. Control angiogram was then performed. TICI 2a flow was achieved. Repeat angiogram with multiple angulation demonstrated persistent occlusion of parietal branch. The same coaxial system was again passed through the balloon guide catheter. The microcatheter system was then advanced through the occlusion of the parietal branch. Once the micro wire microcatheter were beyond the occlusion, the micro wire was removed and stentreiver device was deployed, stripping the microcatheter from the wire. After the device was deployed across the occlusion, the intermediate catheter was again attempted to advanced to the M1 segment. This was unsuccessful, and would not advance beyond the ophthalmic segment. The balloon at the balloon guide was inflated to profile of the vessel. Local aspiration was performed at the intermediate catheter upon withdrawal of the device under fluoroscopic observation, with aspiration at the balloon guide. Once the retrieve her and intermediate catheter were entirely removed from the system, free aspiration was confirmed at the hub of the intermediate catheter, with free blood return confirmed. Control angiogram was again performed. Improved flow was confirmed, TICI 2b. Balloon guide was withdrawn and angiogram of the cervical segment was performed. We then removed the balloon guide catheter and deployed an 8 French Angio-Seal at the right common femoral artery access. Angiogram was performed of the right sided access and the left-sided access before completing the case. Patient tolerated the procedure well and remained hemodynamically stable throughout. No complications were encountered. Estimated blood loss approximately 50 cc.  IMPRESSION: Status post ultrasound guided access right common femoral artery for cerebral angiogram and a combination of aspiration and mechanical thrombectomy of left M1 emergent large vessel occlusion, and restoration of TICI 2b flow after 2 passes. Status post balloon angioplasty to 6 mm of proximal right common iliac artery for treatment of a restrictive lesion in order to pass 8 French sheath to perform the thrombectomy. Status post deployment of Angio-Seal at the right common femoral artery. At the conclusion of the case, a 4 [place] sheath remains in left common femoral artery for arterial monitoring. Signed, Dulcy Fanny. Dellia Nims, RPVI [facility] PLAN: Formal CT after the case Right hip straight overnight status post 8 French device and Angio-Seal closure Left common femoral artery for French sheath may be removed when hemodynamic monitoring is no longer needed with manual pressure. Goal blood pressure 010 [id number] systolic given the incomplete flow restoration Electronically Signed   By: Corrie Mckusick D.O.   On: 03/23/2018 20:39   Ct Head Code Stroke Wo Contrast  Result Date: 03/12/2018 CLINICAL DATA:  Code stroke.  Deficit not specified EXAM: CT HEAD WITHOUT CONTRAST TECHNIQUE: Contiguous axial images were obtained from the base of the skull through the vertex without intravenous contrast. COMPARISON:  Head CT 07/18/2016 and brain MRI 07/20/2016 FINDINGS: Brain: Multiple remote lacunar infarcts in the deep white matter and deep gray nuclei, chronic when compared to prior head CT and brain MRI. Small remote left cerebellar infarct. No evidence of acute infarct. No hemorrhage or hydrocephalus. Vascular: No hyperdense vessel. Skull: Normal. Negative for fracture or focal lesion. Sinuses/Orbits: No acute finding. Other: These results were communicated to Dr. Rory Percy at 4:43 pmon 01/18/2020by text page via the [facility] messaging system. ASPECTS [facility]  Stroke Program Early CT Score) Not scored without localizing symptoms. Motion artifact at the vertex blurring cortex. IMPRESSION: 1. No acute finding. 2. Motion artifact at the vertex. 3. Multiple remote small vessel infarcts. Electronically Signed   By: Monte Fantasia M.D.   On: 03/19/2018  16:45   Vas [place] Lower Extremity Venous (dvt)  Result Date: 03/09/2018  Lower Venous Study Indications: Stroke.  Limitations: Body habitus. Performing Technologist: Maudry Mayhew MHA, RDMS, RVT, RDCS  Examination Guidelines: A complete evaluation includes B-mode imaging, spectral Doppler, color Doppler, and power Doppler as needed of all accessible portions of each vessel. Bilateral testing is considered an integral part of a complete examination. Limited examinations for reoccurring indications may be performed as noted.  Right Venous Findings: +---------+---------------+---------+-----------+----------+--------------+          CompressibilityPhasicitySpontaneityPropertiesSummary        +---------+---------------+---------+-----------+----------+--------------+ CFV                                                   Not visualized +---------+---------------+---------+-----------+----------+--------------+ SFJ                                                   Not visualized +---------+---------------+---------+-----------+----------+--------------+ FV Prox  Full                                                        +---------+---------------+---------+-----------+----------+--------------+ FV Mid   Full                                                        +---------+---------------+---------+-----------+----------+--------------+ FV DistalFull                                                        +---------+---------------+---------+-----------+----------+--------------+ PFV      Full                                                         +---------+---------------+---------+-----------+----------+--------------+ POP      Full           Yes      Yes                                 +---------+---------------+---------+-----------+----------+--------------+ PTV      Full                                                        +---------+---------------+---------+-----------+----------+--------------+ PERO  Not visualized +---------+---------------+---------+-----------+----------+--------------+  Left Venous Findings: +---------+---------------+---------+-----------+----------+-------+          CompressibilityPhasicitySpontaneityPropertiesSummary +---------+---------------+---------+-----------+----------+-------+ CFV      Full           Yes      Yes                          +---------+---------------+---------+-----------+----------+-------+ SFJ      Full                                                 +---------+---------------+---------+-----------+----------+-------+ FV Prox  Full                                                 +---------+---------------+---------+-----------+----------+-------+ FV Mid   Full                                                 +---------+---------------+---------+-----------+----------+-------+ FV DistalFull                                                 +---------+---------------+---------+-----------+----------+-------+ PFV      Full                                                 +---------+---------------+---------+-----------+----------+-------+ POP      Full           Yes      Yes                          +---------+---------------+---------+-----------+----------+-------+ PTV      Full                                                 +---------+---------------+---------+-----------+----------+-------+ PERO     Full                                                  +---------+---------------+---------+-----------+----------+-------+    Summary: Right: There is no evidence of deep vein thrombosis in the lower extremity. However, portions of this examination were limited- see technologist comments above. No cystic structure found in the popliteal fossa. Left: There is no evidence of deep vein thrombosis in the lower extremity. No cystic structure found in the popliteal fossa.  *See table(s) above for measurements and observations. Electronically signed by Curt Jews MD on 03/09/2018 at 5:40:37 PM.    Final     PHYSICAL EXAM  Temp:  [98 F (36.7 C)-99.5 F (37.5 C)] 99.1 F (37.3  C) (01/06 1600) Pulse Rate:  [52-70] 70 (01/06 1523) Resp:  [19-34] 21 (01/06 1523) BP: (133-153)/(37-88) 149/88 (01/06 0809) SpO2:  [96 %-100 %] 97 % (01/06 1523) Arterial Line BP: (81-171)/(36-115) 131/44 (01/06 1200) FiO2 (%):  [40 %] 40 % (01/06 1523) Weight:  [83.3 kg] 83.3 kg (01/06 0500)  General - Well nourished, well developed, intubated on sedation.  Ophthalmologic - fundi not visualized due to noncooperation.  Cardiovascular - Regular rate and rhythm.  Neuro - intubated now off  sedation,   she is stuporous and unresponsive and not following commands. Globally aphasic Eyes mid position, no deviation, however no doll's eyes.  Not blinking to visual threat bilaterally.  Pupil 2 mm bilaterally, reactive to light.  Left corneal present, right corneal weak reflexes.  Positive gag and cough.  Facial symmetry not able to test due to ET tube.  Left upper and lower extremity spontaneous movements against gravity2/5 on pain stimulation.  Right upper and lower extremity trace withdrawal to pain stimulaiton.  DTR 1+, Babinski negative. Sensation, coordination and gait not tested.   ASSESSMENT/PLAN Yvonne Patel is a 73 y.o. female with history of stroke, hypertension, PVD, diabetes, hyperlipidemia, status post right CEA, CHF admitted for right-sided weakness and aphasia. TPA  given.    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  However, cardioembolic source cannot be excluded.  Resultant intubated on sedation, right hemiparesis  CT showed multiple old lacunar infarcts  CTA head and neck left M1/M2 occlusion.  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  Right precentral gyrus petechial hemorrhage.  MRA left MCA patent with moderate M2 origin stenosis  CT head repeat shows slight increase in right precentral gyrus petechial hemorrhage given on DAPT  2D Echo EF 30 to 35% down from 40 to 45% in 05/2017  LE venous Doppler no DVT  LDL 92  HgbA1c 7.1  SCDs for VTE prophylaxis  aspirin 81 mg daily and clopidogrel 75 mg daily prior to admission, now on aspirin 81 and Plavix due to non-STEMI.   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 Einar Gip as outpatient  If patient has reasonable neurological functional outcome, may consider outpatient follow-up with vascular surgery for subclavian artery stenting  Non-STEMI and cardiomyopathy  Troponin 4.62-6.47-5.08-4.7-3.8-3.88  EF 30 to 35% down from 40 to 45% in 05/2017  Cardiology on board  No intervention recommended  Started on aspirin and Plavix  CT head repeat 03/11/18 shows slight increase in [facility] in   right precentral gyrus petechial hemorrhage given on DAPT  Tapered off nitroglycerin IV  AKI on CKD stage III  creatinine 2.20-2.25->2.97.4.68  CCM on board  BMP monitoring  continue IV fluid   Diabetes  HgbA1c 7.1 goal < 7.0  Uncontrolled  CBG monitoring  SSI  Hypertension . Stable . 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  Put on DAPT and Lipitor 80  Other Active Problems  Hypokalemia-supplement  Hyperglycemia   Hospital day # 5 Plan : Patient's condition seems to have declined with worsening renal failure, decreased urine output and depressed mental status.  I had a long discussion with the patient's daughter and Dr.Agarwala from critical care medicine and Dr Maryjane Hurter from nephrology. I spoke to the patient's son and daughter>they understand her poor prognosis but would like to support her for the next few days and want hemodialysisThis patient is critically ill due to left MCA stroke, severe multivessel atherosclerosis and stenosis, CHF, non-STEMI, hyperglycemia and at significant risk of neurological worsening, death form recurrent stroke, hemorrhagic conversion, heart failure, seizure, DKA. This patient's care requires constant monitoring of vital signs, hemodynamics, respiratory and cardiac monitoring, review of multiple databases, neurological assessment, discussion with family, other specialists and medical decision making of high complexity. I spent 35 minutes of neurocritical care time in the care of this patient. I had long discussion with her daughter at bedside, updated pt current condition, treatment plan and potential  prognosis.She expressed understanding and appreciation.    Antony Contras, MD Stroke Neurology 03/13/2018 5:05 PM    To contact Stroke Continuity provider, please refer to [email or web address] After hours, contact General Neurology

## 2018-03-13 NOTE — Progress Notes (Signed)
eLink Physician-Brief Progress Note Patient Name: Yvonne Patel DOB: Jul 14, 1945 MRN: [id number]   Date of Service  03/13/2018  HPI/Events of Note  Agitation  eICU Interventions  Will order: 1. Increase Fentanyl to 50 mcg IV Q 1 hour PRN agitation.      Intervention Category Minor Interventions: Agitation / anxiety - evaluation and management  [name] 03/13/2018, 12:01 AM

## 2018-03-13 NOTE — Progress Notes (Signed)
Chaplain rec'd referral from nurse.  Chaplain entered room when pt was sleeping and one son bedside.  Son welcomed chaplain's presence and affirmed his faith in prayer.  Son son he himself had just had foot amputated and had been recent [facility] pt. Chaplain said other family members were coming for visits and that they felt mother's faith practice as a Jehovah's Witness (no blood transfusions) were being honored.  Family at peace, he says, but he welcomes chaplain support. Chaplain exited room early due to the arrival of other staff.  [place] Waylon Koffler Pager [phone]

## 2018-03-13 NOTE — Progress Notes (Signed)
CRITICAL VALUE ALERT  Critical Value: Calcium 6.4  Date & Time Notied: 03/13/2018 1720  Provider Notified:Dr.Jeong  Orders Received/Actions taken: Aware and he will take a look and decide whether she needs any replacement

## 2018-03-13 NOTE — Progress Notes (Signed)
Inpatient Diabetes Program Recommendations  AACE/ADA: New Consensus Statement on Inpatient Glycemic Control   Target Ranges:  Prepandial:   less than 140 mg/dL      Peak postprandial:   less than 180 mg/dL (1-2 hours)      Critically ill patients:  140 - 180 mg/dL   Results for JAKI, STEPTOE (MRN [id number]) as of 03/13/2018 09:02  Ref. Range 03/12/2018 08:16 03/12/2018 12:09 03/12/2018 16:06 03/12/2018 19:42 03/12/2018 23:16 03/13/2018 03:38 03/13/2018 07:50  Glucose-Capillary Latest Ref Range: 70 - 99 mg/dL 222 (H) 179 (H) 135 (H) 150 (H) 209 (H) 256 (H) 287 (H)   Review of Glycemic Control  Diabetes history: DM2 Outpatient Diabetes medications: Tresiba 15 units QAM, Metformin 500 mg BID Current orders for Inpatient glycemic control: Novolog 0-9 units Q4H; Vital @ 45 ml/hr  Inpatient Diabetes Program Recommendations:  Insulin - Basal: Please consider ordering Levemir 8 units Q24H (based on 83.3 kg x 0.1 units). Insulin - Tube Feeding Coverage: Please consider ordering Novolog 2 units Q4H for tube feeding coverage. If tube feeding coverage is stopped or held then Novolog tube feeding coverage should also be stopped or held.  Thanks, Barnie Alderman, RN, MSN, CDE Diabetes Coordinator  Inpatient Diabetes Program [phone] (Team Pager from 8am to 5pm)

## 2018-03-14 LAB — MAGNESIUM: Magnesium: 2.4 mg/dL (ref 1.7–2.4)

## 2018-03-14 LAB — BASIC METABOLIC PANEL
Anion gap: 12 (ref 5–15)
Anion gap: 11 (ref 5–15)
Anion gap: 12 (ref 5–15)
Anion gap: 11 (ref 5–15)
Anion gap: 13 (ref 5–15)
BUN: 66 mg/dL — AB (ref 8–23)
BUN: 48 mg/dL — ABNORMAL HIGH (ref 8–23)
BUN: 53 mg/dL — ABNORMAL HIGH (ref 8–23)
BUN: 58 mg/dL — AB (ref 8–23)
BUN: 66 mg/dL — ABNORMAL HIGH (ref 8–23)
CALCIUM: 7.5 mg/dL — AB (ref 8.9–10.3)
CHLORIDE: 105 mmol/L (ref 98–111)
CHLORIDE: 104 mmol/L (ref 98–111)
CO2: 21 mmol/L — ABNORMAL LOW (ref 22–32)
CO2: 21 mmol/L — ABNORMAL LOW (ref 22–32)
CO2: 21 mmol/L — ABNORMAL LOW (ref 22–32)
CO2: 22 mmol/L (ref 22–32)
CO2: 22 mmol/L (ref 22–32)
CREATININE: 3.27 mg/dL — AB (ref 0.44–1.00)
CREATININE: 2.5 mg/dL — AB (ref 0.44–1.00)
Calcium: 7.4 mg/dL — ABNORMAL LOW (ref 8.9–10.3)
Calcium: 7.4 mg/dL — ABNORMAL LOW (ref 8.9–10.3)
Calcium: 7.8 mg/dL — ABNORMAL LOW (ref 8.9–10.3)
Calcium: 7.8 mg/dL — ABNORMAL LOW (ref 8.9–10.3)
Chloride: 103 mmol/L (ref 98–111)
Chloride: 104 mmol/L (ref 98–111)
Chloride: 102 mmol/L (ref 98–111)
Creatinine, Ser: 2.53 mg/dL — ABNORMAL HIGH (ref 0.44–1.00)
Creatinine, Ser: 2.87 mg/dL — ABNORMAL HIGH (ref 0.44–1.00)
Creatinine, Ser: 3.14 mg/dL — ABNORMAL HIGH (ref 0.44–1.00)
GFR calc Af Amer: 16 mL/min — ABNORMAL LOW (ref >60)
GFR calc Af Amer: 22 mL/min — ABNORMAL LOW (ref >60)
GFR calc Af Amer: 21 mL/min — ABNORMAL LOW (ref >60)
GFR calc Af Amer: 18 mL/min — ABNORMAL LOW (ref >60)
GFR calc Af Amer: 16 mL/min — ABNORMAL LOW (ref >60)
GFR calc non Af Amer: 13 mL/min — ABNORMAL LOW (ref >60)
GFR calc non Af Amer: 19 mL/min — ABNORMAL LOW (ref >60)
GFR calc non Af Amer: 18 mL/min — ABNORMAL LOW (ref >60)
GFR calc non Af Amer: 16 mL/min — ABNORMAL LOW (ref >60)
GFR, EST NON AFRICAN AMERICAN: 14 mL/min — AB (ref >60)
Glucose, Bld: 159 mg/dL — ABNORMAL HIGH (ref 70–99)
Glucose, Bld: 244 mg/dL — ABNORMAL HIGH (ref 70–99)
Glucose, Bld: 218 mg/dL — ABNORMAL HIGH (ref 70–99)
Glucose, Bld: 86 mg/dL (ref 70–99)
Glucose, Bld: 102 mg/dL — ABNORMAL HIGH (ref 70–99)
Potassium: 4.2 mmol/L (ref 3.5–5.1)
Potassium: 4.4 mmol/L (ref 3.5–5.1)
Potassium: 5.2 mmol/L — ABNORMAL HIGH (ref 3.5–5.1)
Potassium: 5.4 mmol/L — ABNORMAL HIGH (ref 3.5–5.1)
Potassium: 4.4 mmol/L (ref 3.5–5.1)
Sodium: 138 mmol/L (ref 135–145)
Sodium: 136 mmol/L (ref 135–145)
Sodium: 136 mmol/L (ref 135–145)
Sodium: 137 mmol/L (ref 135–145)
Sodium: 137 mmol/L (ref 135–145)

## 2018-03-14 LAB — GLUCOSE, CAPILLARY
GLUCOSE-CAPILLARY: 156 mg/dL — AB (ref 70–99)
Glucose-Capillary: 100 mg/dL — ABNORMAL HIGH (ref 70–99)
Glucose-Capillary: 100 mg/dL — ABNORMAL HIGH (ref 70–99)
Glucose-Capillary: 114 mg/dL — ABNORMAL HIGH (ref 70–99)
Glucose-Capillary: 114 mg/dL — ABNORMAL HIGH (ref 70–99)
Glucose-Capillary: 127 mg/dL — ABNORMAL HIGH (ref 70–99)
Glucose-Capillary: 134 mg/dL — ABNORMAL HIGH (ref 70–99)
Glucose-Capillary: 139 mg/dL — ABNORMAL HIGH (ref 70–99)
Glucose-Capillary: 144 mg/dL — ABNORMAL HIGH (ref 70–99)
Glucose-Capillary: 148 mg/dL — ABNORMAL HIGH (ref 70–99)
Glucose-Capillary: 161 mg/dL — ABNORMAL HIGH (ref 70–99)
Glucose-Capillary: 172 mg/dL — ABNORMAL HIGH (ref 70–99)
Glucose-Capillary: 191 mg/dL — ABNORMAL HIGH (ref 70–99)
Glucose-Capillary: 196 mg/dL — ABNORMAL HIGH (ref 70–99)
Glucose-Capillary: 200 mg/dL — ABNORMAL HIGH (ref 70–99)
Glucose-Capillary: 209 mg/dL — ABNORMAL HIGH (ref 70–99)
Glucose-Capillary: 212 mg/dL — ABNORMAL HIGH (ref 70–99)
Glucose-Capillary: 227 mg/dL — ABNORMAL HIGH (ref 70–99)
Glucose-Capillary: 243 mg/dL — ABNORMAL HIGH (ref 70–99)
Glucose-Capillary: 247 mg/dL — ABNORMAL HIGH (ref 70–99)
Glucose-Capillary: 309 mg/dL — ABNORMAL HIGH (ref 70–99)
Glucose-Capillary: 88 mg/dL (ref 70–99)
Glucose-Capillary: 88 mg/dL (ref 70–99)
Glucose-Capillary: 91 mg/dL (ref 70–99)
Glucose-Capillary: 95 mg/dL (ref 70–99)

## 2018-03-14 LAB — COMPREHENSIVE METABOLIC PANEL
ALK PHOS: 679 U/L — AB (ref 38–126)
ALT: 1072 U/L — ABNORMAL HIGH (ref 0–44)
AST: 960 U/L — ABNORMAL HIGH (ref 15–41)
Albumin: 1.9 g/dL — ABNORMAL LOW (ref 3.5–5.0)
Anion gap: 14 (ref 5–15)
BUN: 58 mg/dL — ABNORMAL HIGH (ref 8–23)
CO2: 21 mmol/L — ABNORMAL LOW (ref 22–32)
CREATININE: 2.91 mg/dL — AB (ref 0.44–1.00)
Calcium: 7.3 mg/dL — ABNORMAL LOW (ref 8.9–10.3)
Chloride: 103 mmol/L (ref 98–111)
GFR calc Af Amer: 18 mL/min — ABNORMAL LOW (ref >60)
GFR calc non Af Amer: 15 mL/min — ABNORMAL LOW (ref >60)
Glucose, Bld: 181 mg/dL — ABNORMAL HIGH (ref 70–99)
Potassium: 4.3 mmol/L (ref 3.5–5.1)
Sodium: 138 mmol/L (ref 135–145)
Total Bilirubin: 1.1 mg/dL (ref 0.3–1.2)
Total Protein: 5.7 g/dL — ABNORMAL LOW (ref 6.5–8.1)

## 2018-03-14 LAB — CBC
HCT: 26.5 % — ABNORMAL LOW (ref 36.0–46.0)
Hemoglobin: 8.6 g/dL — ABNORMAL LOW (ref 12.0–15.0)
MCH: 30.2 pg (ref 26.0–34.0)
MCHC: 32.5 g/dL (ref 30.0–36.0)
MCV: 93 fL (ref 80.0–100.0)
Platelets: 110 10*3/uL — ABNORMAL LOW (ref 150–400)
RBC: 2.85 MIL/uL — ABNORMAL LOW (ref 3.87–5.11)
RDW: 13.6 % (ref 11.5–15.5)
WBC: 11.3 10*3/uL — ABNORMAL HIGH (ref 4.0–10.5)
nRBC: 1.3 % — ABNORMAL HIGH (ref 0.0–0.2)

## 2018-03-14 LAB — POCT I-STAT 3, ART BLOOD GAS (G3+)
Acid-base deficit: 4 mmol/L — ABNORMAL HIGH (ref 0.0–2.0)
Bicarbonate: 17.5 mmol/L — ABNORMAL LOW (ref 20.0–28.0)
O2 Saturation: 100 %
Patient temperature: 38
TCO2: 18 mmol/L — ABNORMAL LOW (ref 22–32)
pCO2 arterial: 21.7 mmHg — ABNORMAL LOW (ref 32.0–48.0)
pH, Arterial: 7.517 — ABNORMAL HIGH (ref 7.350–7.450)
pO2, Arterial: 187 mmHg — ABNORMAL HIGH (ref 83.0–108.0)

## 2018-03-14 MED ORDER — INSULIN GLARGINE 100 UNIT/ML ~~LOC~~ SOLN
10.0000 [IU] | Freq: Every day | SUBCUTANEOUS | Status: DC
  Administered 2018-03-15: 10 [IU] via SUBCUTANEOUS
  Filled 2018-03-14: qty 0.1

## 2018-03-14 MED ORDER — FENTANYL CITRATE (PF) 100 MCG/2ML IJ SOLN
50.0000 ug | INTRAMUSCULAR | Status: DC | PRN
  Administered 2018-03-14 – 2018-03-16 (×10): 100 ug via INTRAVENOUS
  Filled 2018-03-14 (×10): qty 2

## 2018-03-14 MED ORDER — VITAL AF 1.2 CAL PO LIQD
1000.0000 mL | ORAL | Status: DC
  Administered 2018-03-14 – 2018-03-17 (×4): 1000 mL

## 2018-03-14 MED ORDER — HYDRALAZINE HCL 20 MG/ML IJ SOLN
10.0000 mg | INTRAMUSCULAR | Status: DC | PRN
  Administered 2018-03-14 (×2): 10 mg via INTRAVENOUS
  Filled 2018-03-14 (×3): qty 1

## 2018-03-14 MED ORDER — INSULIN ASPART 100 UNIT/ML ~~LOC~~ SOLN
0.0000 [IU] | SUBCUTANEOUS | Status: DC
  Administered 2018-03-15: 11 [IU] via SUBCUTANEOUS
  Administered 2018-03-15 (×2): 8 [IU] via SUBCUTANEOUS
  Administered 2018-03-15 (×2): 3 [IU] via SUBCUTANEOUS
  Administered 2018-03-15: 8 [IU] via SUBCUTANEOUS
  Administered 2018-03-16: 3 [IU] via SUBCUTANEOUS
  Administered 2018-03-16 (×3): 5 [IU] via SUBCUTANEOUS
  Administered 2018-03-16: 3 [IU] via SUBCUTANEOUS
  Administered 2018-03-17: 5 [IU] via SUBCUTANEOUS
  Administered 2018-03-17: 3 [IU] via SUBCUTANEOUS
  Administered 2018-03-17 (×2): 5 [IU] via SUBCUTANEOUS

## 2018-03-14 MED ORDER — HYDRALAZINE HCL 50 MG PO TABS
50.0000 mg | ORAL_TABLET | Freq: Four times a day (QID) | ORAL | Status: DC
  Administered 2018-03-14 (×4): 50 mg via ORAL
  Filled 2018-03-14 (×4): qty 1

## 2018-03-14 NOTE — Progress Notes (Signed)
[facility] TEAM PROGRESS NOTE   SUBJECTIVE (INTERVAL HISTORY) Patient's   Renal function is  Improved with CVVHD. However and liver enzymes are significantly elevated etiology is indeterminate. She is neurologically awake but not following commands consistently. OBJECTIVE Temp:  [96.2 F (35.7 C)-99.1 F (37.3 C)] 96.7 F (35.9 C) (01/07 0800) Pulse Rate:  [54-77] 77 (01/07 1500) Cardiac Rhythm: Normal sinus rhythm (01/07 0800) Resp:  [15-29] 24 (01/07 1500) BP: (168-190)/(41-59) 190/45 (01/07 1108) SpO2:  [94 %-100 %] 100 % (01/07 1500) Arterial Line BP: (138-198)/(37-124) 181/46 (01/07 1500) FiO2 (%):  [40 %] 40 % (01/07 1108) Weight:  [82.6 kg] 82.6 kg (01/07 0500)  Recent Labs  Lab 03/14/18 1000 03/14/18 1100 03/14/18 1158 03/14/18 1253 03/14/18 1359  GLUCAP 247* 243* 209* 191* 200*   Recent Labs  Lab 03/11/18 1245 03/11/18 1700 03/12/18 0453 03/12/18 1615  03/13/18 1531  03/13/18 2251 03/14/18 0200 03/14/18 0509 03/14/18 1000 03/14/18 1405  NA  --   --  138  --    < > 138   < > 139 138 138 136 136  K  --   --  4.8  --    < > 4.9   < > 4.3 4.2 4.3 4.4 5.2*  CL  --   --  111  --    < > 109   < > 107 105 103 104 103  CO2  --   --  15*  --    < > 14*   < > 21* 21* 21* 21* 21*  GLUCOSE  --   --  272*  --    < > 370*   < > 177* 159* 181* 244* 218*  BUN  --   --  83*  --    < > 119*   < > 77* 66* 58* 48* 53*  CREATININE  --   --  5.26*  --    < > 6.09*   < > 3.88* 3.27* 2.91* 2.50* 2.53*  CALCIUM  --   --  7.0*  --    < > 6.4*   < > 7.3* 7.4* 7.3* 7.4* 7.5*  MG 2.2 2.2 2.3 2.4  --   --   --   --   --  2.4  --   --   PHOS 8.2* 8.1* 7.5* 8.2*  --  7.8*  --   --   --   --   --   --    < > = values in this interval not displayed.   Recent Labs  Lab 03/11/2018 1656 03/09/18 1055 03/10/18 0503 03/14/18 0509  AST 27  --   --  960*  ALT 26  --   --  1,072*  ALKPHOS 97  --   --  679*  BILITOT 0.7  --   --  1.1  PROT 7.4  --   --  5.7*  ALBUMIN 3.0* 2.1* 2.1* 1.9*    Recent Labs  Lab 04/07/2018 1656  03/10/18 0503 03/11/18 0535 03/12/18 0453 03/13/18 0513 03/14/18 0509  WBC 6.3   < > 8.4 12.1* 13.1* 13.0* 11.3*  NEUTROABS 3.2  --   --   --   --   --   --   HGB 12.2   < > 9.0* 9.3* 8.9* 8.4* 8.6*  HCT 38.5   < > 28.8* 28.9* 27.3* 26.3* 26.5*  MCV 94.1   < > 96.0 95.4 95.5 95.6 93.0  PLT 174   < >  145* 116* 106* 100* 110*   < > = values in this interval not displayed.   Recent Labs  Lab 03/09/18 0019 03/09/18 0432 03/09/18 1040 03/09/18 1526 03/09/18 2224  TROPONINI 6.47* 5.08* 4.70* 3.81* 3.88*   No results for input(s): LABPROT, INR in the last 72 hours. No results for input(s): COLORURINE, LABSPEC, [place], GLUCOSEU, HGBUR, BILIRUBINUR, KETONESUR, PROTEINUR, UROBILINOGEN, NITRITE, LEUKOCYTESUR in the last 72 hours.  Invalid input(s): APPERANCEUR     Component Value Date/Time   CHOL 141 03/09/2018 0432   TRIG 120 03/09/2018 0432   HDL 25 (L) 03/09/2018 0432   CHOLHDL 5.6 03/09/2018 0432   VLDL 24 03/09/2018 0432   LDLCALC 92 03/09/2018 0432   Lab Results  Component Value Date   HGBA1C 7.1 (H) 03/09/2018   No results found for: LABOPIA, COCAINSCRNUR, LABBENZ, AMPHETMU, THCU, LABBARB  No results for input(s): ETH in the last 168 hours.  I have personally reviewed the radiological images below and agree with the radiology interpretations.  Ct Angio Head W Or Wo Contrast  Result Date: 03/20/2018 CLINICAL DATA:  Code stroke, deficit not specified EXAM: CT ANGIOGRAPHY HEAD AND NECK TECHNIQUE: Multidetector CT imaging of the head and neck was performed using the standard protocol during bolus administration of intravenous contrast. Multiplanar CT image reconstructions and MIPs were obtained to evaluate the vascular anatomy. Carotid stenosis measurements (when applicable) are obtained utilizing NASCET criteria, using the distal internal carotid diameter as the denominator. CONTRAST:  42mL ISOVUE-370 IOPAMIDOL (ISOVUE-370) INJECTION 76%  COMPARISON:  None. FINDINGS: CTA NECK FINDINGS Aortic arch: Extensive atherosclerotic calcification. Three vessel branching Right carotid system: Diffuse mainly calcified atherosclerotic plaque which limits visualization of the lumen due to plaque blooming. There is distal common carotid stenosis that measures up to 70% (when compared to the more proximal vessel given the immediate downstream bifurcation). There is ICA bulb plaque with 70% stenosis. Left carotid system: Much less extensive atherosclerotic plaque, question prior carotid endarterectomy. No stenosis or ulceration. Vertebral arteries: Severe bilateral proximal subclavian stenosis, with possible occlusion on the right. Narrowing on the left involves and extensive segment. No flow is seen within vertebral arteries until the V3 segments, there may be retrograde flow Skeleton: Diffuse degenerative disease.  No acute finding Other neck: Bilateral cataract resection. Upper chest: Hazy density of the lungs which could be from atelectasis. No Kerley lines to implicate edema. Review of the MIP images confirms the above findings CTA HEAD FINDINGS Anterior circulation: Confluent atherosclerotic calcification on the carotid siphons with small vessel size and plaque blooming limiting luminal visualization. There is a left M2 branch occlusion beginning at the M1 bifurcation with subsequent downstream reconstitution. No contralateral embolism is seen. Posterior circulation: Tiny vertebrobasilar arteries. There may be a basilar interruption from atherosclerosis or developmental variant. Fetal type flow to both posterior cerebral arteries which are symmetrically patent Venous sinuses: Patent as permitted by contrast timing Anatomic variants: As above Delayed phase: Not obtained in the emergent setting Review of the MIP images confirms the above findings Case discussed with Dr. Rory Percy at 5:04 p.m. IMPRESSION: 1. Left M2 branch occlusion beginning at the M1 bifurcation. 2.  Severe atherosclerosis which preferentially spares the left carotid circulation, question prior endarterectomy. 3. Severe atheromatous narrowing if not occlusion of bilateral proximal subclavian arteries. There is bilateral fetal type PCA with diffuse thready flow in the posterior fossa. 4. 70% atheromatous narrowing of the right common carotid and ICA bulb. Electronically Signed   By: Monte Fantasia M.D.   On:  03/16/2018 17:16   Dg Abd 1 View  Result Date: 03/12/2018 CLINICAL DATA:  Orogastric tube placement.  Stroke. EXAM: ABDOMEN - 1 VIEW COMPARISON:  None. FINDINGS: Gastric decompression catheter extends into the stomach with the tip likely in the distal stomach near the antrum. No evidence of overt bowel obstruction or ileus. IMPRESSION: Orogastric tube extends into the stomach with the tip located in the distal stomach, likely near the antrum. Electronically Signed   By: Aletta Edouard M.D.   On: 03/12/2018 08:43   Ct Head Wo Contrast  Result Date: 03/11/2018 CLINICAL DATA:  Follow-up examination for acute stroke. EXAM: CT HEAD WITHOUT CONTRAST TECHNIQUE: Contiguous axial images were obtained from the base of the skull through the vertex without intravenous contrast. COMPARISON:  Comparison made with prior MRI from 03/09/2018 as well as CT from 03/23/2018. FINDINGS: Brain: There has been continued interval evolution of small to moderate sized left MCA territory infarct, overall stable in distribution as compared to previous MRI. Mild localized edema without significant mass effect. Additional involving subcentimeter left cerebellar infarct noted as well. No evidence for hemorrhagic transformation or other complication. Previously noted additional punctate subcentimeter infarcts not well seen by CT. Subcentimeter hyperdensity involving the right frontal cortex slightly increased in size on today's exam measuring 5 mm, consistent with previously identified small hemorrhage. No significant mass effect or  edema. No other evidence for new or interval infarction. No other acute large vessel territory infarct. Underlying atrophy with chronic microvascular ischemic disease noted, stable. Vascular: No hyperdense vessel. Calcified atherosclerosis noted at the skull base. Skull: Scalp soft tissues and calvarium within normal limits. Sinuses/Orbits: Globes and orbital soft tissues demonstrate no acute finding. Paranasal sinuses and mastoid air cells remain clear. Other: None. IMPRESSION: 1. Normal expected interval evolution of small to moderate sized left MCA territory infarcts. No evidence for hemorrhagic transformation or other complication. Additional previously identified subcentimeter acute ischemic infarcts not well visualized by CT. 2. Slight interval increase in size of cortically based right frontal parenchymal hemorrhage, now measuring 5 mm (previously 3 mm). 3. No other new acute intracranial abnormality. Electronically Signed   By: Jeannine Boga M.D.   On: 03/11/2018 03:41   Ct Head Wo Contrast  Result Date: 04/03/2018 CLINICAL DATA:  Post thrombectomy EXAM: CT HEAD WITHOUT CONTRAST TECHNIQUE: Contiguous axial images were obtained from the base of the skull through the vertex without intravenous contrast. COMPARISON:  Head CT from earlier today FINDINGS: Brain: 3 mm focus of high density along the high and posterior right frontal convexity, on reformats this appears to be intraparenchymal. No hemorrhagic conversion or visible infarct in the area of procedure. Multiple remote small vessel infarcts. Vascular: Major vessels are symmetrically enhancing. Skull: Negative Sinuses/Orbits: Negative These results were called by telephone at the time of interpretation on 03/10/2018 at 8:17 pm to Dr. Cheral Marker , who verbally acknowledged these results. IMPRESSION: New 3 mm high-density within the posterior right frontal cortex which could be focus of hemorrhage or an enhancing subacute infarct. No hemorrhage or  infarct seen along the postoperative left MCA distribution. Electronically Signed   By: Monte Fantasia M.D.   On: 03/29/2018 20:18   Ct Angio Neck W Or Wo Contrast  Result Date: 03/09/2018 CLINICAL DATA:  Code stroke, deficit not specified EXAM: CT ANGIOGRAPHY HEAD AND NECK TECHNIQUE: Multidetector CT imaging of the head and neck was performed using the standard protocol during bolus administration of intravenous contrast. Multiplanar CT image reconstructions and MIPs were obtained to evaluate  the vascular anatomy. Carotid stenosis measurements (when applicable) are obtained utilizing NASCET criteria, using the distal internal carotid diameter as the denominator. CONTRAST:  22mL ISOVUE-370 IOPAMIDOL (ISOVUE-370) INJECTION 76% COMPARISON:  None. FINDINGS: CTA NECK FINDINGS Aortic arch: Extensive atherosclerotic calcification. Three vessel branching Right carotid system: Diffuse mainly calcified atherosclerotic plaque which limits visualization of the lumen due to plaque blooming. There is distal common carotid stenosis that measures up to 70% (when compared to the more proximal vessel given the immediate downstream bifurcation). There is ICA bulb plaque with 70% stenosis. Left carotid system: Much less extensive atherosclerotic plaque, question prior carotid endarterectomy. No stenosis or ulceration. Vertebral arteries: Severe bilateral proximal subclavian stenosis, with possible occlusion on the right. Narrowing on the left involves and extensive segment. No flow is seen within vertebral arteries until the V3 segments, there may be retrograde flow Skeleton: Diffuse degenerative disease.  No acute finding Other neck: Bilateral cataract resection. Upper chest: Hazy density of the lungs which could be from atelectasis. No Kerley lines to implicate edema. Review of the MIP images confirms the above findings CTA HEAD FINDINGS Anterior circulation: Confluent atherosclerotic calcification on the carotid siphons with  small vessel size and plaque blooming limiting luminal visualization. There is a left M2 branch occlusion beginning at the M1 bifurcation with subsequent downstream reconstitution. No contralateral embolism is seen. Posterior circulation: Tiny vertebrobasilar arteries. There may be a basilar interruption from atherosclerosis or developmental variant. Fetal type flow to both posterior cerebral arteries which are symmetrically patent Venous sinuses: Patent as permitted by contrast timing Anatomic variants: As above Delayed phase: Not obtained in the emergent setting Review of the MIP images confirms the above findings Case discussed with Dr. Rory Percy at 5:04 p.m. IMPRESSION: 1. Left M2 branch occlusion beginning at the M1 bifurcation. 2. Severe atherosclerosis which preferentially spares the left carotid circulation, question prior endarterectomy. 3. Severe atheromatous narrowing if not occlusion of bilateral proximal subclavian arteries. There is bilateral fetal type PCA with diffuse thready flow in the posterior fossa. 4. 70% atheromatous narrowing of the right common carotid and ICA bulb. Electronically Signed   By: Monte Fantasia M.D.   On: 03/13/2018 17:16   Mr Jodene Nam Head Wo Contrast  Result Date: 03/09/2018 CLINICAL DATA:  Stroke follow-up. Status post endovascular revascularization of left MCA occlusion yesterday. EXAM: MRI HEAD WITHOUT CONTRAST MRA HEAD WITHOUT CONTRAST TECHNIQUE: Multiplanar, multiecho pulse sequences of the brain and surrounding structures were obtained without intravenous contrast. Angiographic images of the head were obtained using MRA technique without contrast. COMPARISON:  Head CT and CTA 03/25/2018. Brain MRI 07/20/2016. Head MRA 09/30/2011. FINDINGS: MRI HEAD FINDINGS Brain: There is a small to slightly moderate-sized acute left MCA infarct involving the insula, predominantly posterior frontal lobe as well as operculum, and small amount of the postcentral gyrus. There also punctate  acute infarcts involving the left caudate nucleus, right centrum semiovale, parasagittal right parietal cortex, and likely left occipital white matter. There are also small acute infarcts in the left greater than right cerebellar hemispheres. Susceptibility artifact in the right precentral gyrus corresponds to the 4 mm acute hemorrhage on yesterday's CT with minimal surrounding edema. There are numerous chronic microhemorrhages clustered about the right insula and operculum which are new from the 2018 MRI. Multiple additional chronic microhemorrhages are present in the basal ganglia bilaterally and scattered throughout both cerebral hemispheres and cerebellum. Patchy T2 hyperintensities in the cerebral white matter and pons have progressed from the prior MRI and are nonspecific but compatible with  moderate chronic small-vessel ischemic disease. There are new chronic lacunar infarcts in the cerebral white matter, and there are chronic lacunar infarcts in the basal ganglia bilaterally. Small chronic infarcts in the cerebellar hemispheres have increased in number from the prior MRI. Mild cerebral atrophy is within normal limits for age. No mass, midline shift, or extra-axial fluid collection is identified. Vascular: Diminutive vertebrobasilar circulation, more fully evaluated below. Skull and upper cervical spine: Unremarkable bone marrow signal. Sinuses/Orbits: Bilateral cataract extraction. Paranasal sinuses and mastoid air cells are clear. Other: Partially visualized endotracheal and enteric tubes. MRA HEAD FINDINGS The distal vertebral arteries are diminutive with diminished flow most notable in the distal right V3 and V4 segments and with bilateral proximal vertebral artery occlusion shown on yesterday's neck CTA. Patent bilateral PICA, right AICA, and bilateral SCA origins are identified. There is a chronic lack of visualization of the mid basilar artery which appears patent more proximally and distally, and this  may reflect segmental atherosclerotic occlusion or a developmental variant. There are large posterior communicating arteries bilaterally with absence or severe hypoplasia of the right P1 segment. Both PCAs are patent without evidence of significant proximal stenosis. The internal carotid arteries are patent from skull base to carotid termini with extensive bilateral cavernous segment atherosclerotic irregularity resulting in mild stenosis on the right and moderate stenosis on the left. 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 ACAs and MCAs are patent with branch vessel irregularity but no evidence of proximal branch occlusion. The left MCA trifurcation remains patent following thrombectomy although there is moderate stenosis of the inferior and mid M2 origins. There also mild proximal left M1 and left A1 stenoses. IMPRESSION: 1. Small to moderate-sized acute left MCA infarct. 2. Additional predominantly subcentimeter acute infarcts in the cerebral and cerebellar hemispheres bilaterally. 3. 4 mm acute hemorrhage in the right precentral gyrus as seen on CT yesterday. 4. Progressive chronic small vessel ischemia since 2018 with increased number of chronic microhemorrhages and chronic lacunar infarcts. 5. Advanced intracranial atherosclerosis. Revascularized left MCA bifurcation remains patent with moderate M2 origin stenoses. 6. Moderate ICA stenoses. 7. Diminished flow in the distal vertebral arteries with bilateral proximal occlusion shown on prior CTA. 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 8. 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 Electronically Signed   By: Logan Bores M.D.   On: 03/09/2018 17:17   Mr Brain Wo Contrast  Result Date: 03/09/2018 CLINICAL  DATA:  Stroke follow-up. Status post endovascular revascularization of left MCA occlusion yesterday. EXAM: MRI HEAD WITHOUT CONTRAST MRA HEAD WITHOUT CONTRAST TECHNIQUE: Multiplanar, multiecho pulse sequences of the brain and surrounding structures were obtained without intravenous contrast. Angiographic images of the head were obtained using MRA technique without contrast. COMPARISON:  Head CT and CTA 03/10/2018. Brain MRI 07/20/2016. Head MRA 09/30/2011. FINDINGS: MRI HEAD FINDINGS Brain: There is a small to slightly moderate-sized acute left MCA infarct involving the insula, predominantly posterior frontal lobe as well as operculum, and small amount of the postcentral gyrus. There also punctate acute infarcts involving the left caudate nucleus, right centrum semiovale, parasagittal right parietal cortex, and likely left occipital white matter. There are also small acute infarcts in the left greater than right cerebellar hemispheres. Susceptibility artifact in the right precentral gyrus corresponds to the 4  mm acute hemorrhage on yesterday's CT with minimal surrounding edema. There are numerous chronic microhemorrhages clustered about the right insula and operculum which are new from the 2018 MRI. Multiple additional chronic microhemorrhages are present in the basal ganglia bilaterally and scattered throughout both cerebral hemispheres and cerebellum. Patchy T2 hyperintensities in the cerebral white matter and pons have progressed from the prior MRI and are nonspecific but compatible with moderate chronic small-vessel ischemic disease. There are new chronic lacunar infarcts in the cerebral white matter, and there are chronic lacunar infarcts in the basal ganglia bilaterally. Small chronic infarcts in the cerebellar hemispheres have increased in number from the prior MRI. Mild cerebral atrophy is within normal limits for age. No mass, midline shift, or extra-axial fluid collection is identified. Vascular:  Diminutive vertebrobasilar circulation, more fully evaluated below. Skull and upper cervical spine: Unremarkable bone marrow signal. Sinuses/Orbits: Bilateral cataract extraction. Paranasal sinuses and mastoid air cells are clear. Other: Partially visualized endotracheal and enteric tubes. MRA HEAD FINDINGS The distal vertebral arteries are diminutive with diminished flow most notable in the distal right V3 and V4 segments and with bilateral proximal vertebral artery occlusion shown on yesterday's neck CTA. Patent bilateral PICA, right AICA, and bilateral SCA origins are identified. There is a chronic lack of visualization of the mid basilar artery which appears patent more proximally and distally, and this may reflect segmental atherosclerotic occlusion or a developmental variant. There are large posterior communicating arteries bilaterally with absence or severe hypoplasia of the right P1 segment. Both PCAs are patent without evidence of significant proximal stenosis. The internal carotid arteries are patent from skull base to carotid termini with extensive bilateral cavernous segment atherosclerotic irregularity resulting in mild stenosis on the right and moderate stenosis on the left. 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 ACAs and MCAs are patent with branch vessel irregularity but no evidence of proximal branch occlusion. The left MCA trifurcation remains patent following thrombectomy although there is moderate stenosis of the inferior and mid M2 origins. There also mild proximal left M1 and left A1 stenoses. IMPRESSION: 1. Small to moderate-sized acute left MCA infarct. 2. Additional predominantly subcentimeter acute infarcts in the cerebral and cerebellar hemispheres bilaterally. 3. 4 mm acute hemorrhage in the right precentral gyrus as seen on CT yesterday. 4. Progressive  chronic small vessel ischemia since 2018 with increased number of chronic microhemorrhages and chronic lacunar infarcts. 5. Advanced intracranial atherosclerosis. Revascularized left MCA bifurcation remains patent with moderate M2 origin stenoses. 6. Moderate ICA stenoses. 7. Diminished flow in the distal vertebral arteries with bilateral proximal occlusion shown on prior CTA. 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 8. 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 Electronically Signed   By: Logan Bores M.D.   On: 03/09/2018 17:17   Ir Angiogram Extremity Bilateral  Result Date: 03/21/2018 INDICATION: 73 year old female with a history of acute left MCA stroke. She presents within time frame for IV tPA, and is a candidate for mechanical thrombectomy EXAM: ULTRASOUND GUIDED ACCESS RIGHT COMMON FEMORAL ARTERY ULTRASOUND GUIDED ACCESS LEFT COMMON FEMORAL ARTERY FOR ARTERIAL MONITORING ANGIOGRAM RIGHT LOWER EXTREMITY BALLOON ANGIOPLASTY STENOSIS RIGHT COMMON ILIAC ARTERY IN ORDER TO PASS 8 FRENCH SHEATH TO COMPLETE THE THROMBECTOMY CEREBRAL ANGIOGRAM MECHANICAL THROMBECTOMY LEFT MCA EMERGENT LARGE VESSEL OCCLUSION  DEPLOYMENT OF ANGIO-SEAL RIGHT COMMON FEMORAL ARTERY COMPARISON:  CT imaging of the same day MEDICATIONS: 2 g Ancef IV. The antibiotic was administered within 1 hour of the procedure ANESTHESIA/SEDATION: General endotracheal tube anesthesia CONTRAST:  96 cc Isovue-300 FLUOROSCOPY TIME:  Fluoroscopy Time: 26 minutes 24 seconds (1,362 mGy). COMPLICATIONS: None TECHNIQUE: Informed written consent was obtained from the patient's family after a thorough discussion of the procedural risks, benefits and alternatives. Specific risks discussed include: Bleeding, infection, contrast reaction, kidney injury/failure, need for further procedure/surgery, arterial injury or dissection, embolization to  new territory, intracranial hemorrhage (10-15% risk), neurologic deterioration, cardiopulmonary collapse, death. All questions were addressed. Maximal Sterile Barrier Technique was utilized including during the procedure including caps, mask, sterile gowns, sterile gloves, sterile drape, hand hygiene and skin antiseptic. A timeout was performed prior to the initiation of the procedure. The anesthesia team was present to provide general endotracheal tube anesthesia and for patient monitoring during the procedure. Interventional neuro radiology nursing staff was also present. 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 Surgical changes of prior endarterectomy evident on prior CT imaging. Left external carotid artery: Patent with antegrade flow. Left internal carotid artery: Normal course caliber and contour of the cervical portion. Vertical and petrous segment patent with normal course caliber contour. Cavernous segment patent. Clinoid segment patent. Antegrade flow of the ophthalmic artery. Ophthalmic segment patent. Terminus patent. Left MCA: Distal M1 occluded at the initiation of the case. There is partial filling of an inferior branch which is the non dominant branch to the frontal. Patent fetal PCA to the left P2 segment. Left ACA: A 1 segment patent. A 2 segment perfuses the right territory. Completion Findings: Left MCA: After 2 passes of mechanical thrombectomy plus aspiration, there is near complete restoration of flow through the left MCA territory, with slow flow through an M3/M4 branch of the parietal region compatible with TI CI 2b flow. 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 Physical exam At the initiation of the case, palpable bilateral common femoral artery pulses present No palpable distal pulses Doppler positive pulses of the right  posterior tibial and anterior tibial (with systolic blood pressures above 200) Doppler positive pulse at the left posterior tibial (with systolic blood pressure above 200), with no Doppler signal at the left anterior tibial At completion of case Doppler positive pulses were only discovered on the right posterior tibial and anterior tibial arteries with blood pressures above 200. In the 140-160 range (goal) Doppler signal was not evident A completion of case Doppler positive pulses were only discovered on the left at the posterior tibial and not the left anterior tibial (with the pressure above 619 systolic). PROCEDURE: Patient is brought emergently to the neuro angiography suite, with the patient identified appropriately and placed supine position on the table. Left radial arterial line was attempted by the anesthesia team. The patient is then prepped and draped in the usual sterile fashion. Ultrasound survey of the right inguinal region was performed with images stored and sent to PACs. 11 blade scalpel was used to make a small incision. Blunt dissection was performed. A micropuncture needle was used access the right common femoral artery under ultrasound. With excellent arterial blood flow returned, an .018 micro wire was passed through the needle, observed to enter the abdominal aorta under fluoroscopy. The needle was removed, and a micropuncture sheath was placed over the wire. The inner  dilator and wire were removed, and an 035 Bentson wire was advanced under fluoroscopy into the abdominal aorta. The sheath was removed and a standard 5 [place] vascular sheath was placed. The dilator was removed and the sheath was flushed. Ultrasound survey of the left inguinal region was then performed with images stored and sent to PACs, confirming patency of the vessel. A micropuncture needle was used access the left common femoral artery under ultrasound. With excellent arterial blood flow returned, and an .018 micro wire was  passed through the needle, observed enter the abdominal aorta under fluoroscopy. The needle was removed, and a micropuncture sheath was placed over the wire. The inner dilator and wire were removed, and an 035 Bentson wire was advanced under fluoroscopy into the abdominal aorta. The sheath was removed and a standard 4 [place] vascular sheath was placed for arterial monitoring. The dilator was removed and the sheath was flushed. A 38F JB-1 diagnostic catheter was then advanced over the wire through the existing right femoral access to the proximal descending thoracic aorta. Wire was then removed. Double flush of the catheter was performed. Catheter was then used to select the left cervical internal carotid artery. Formal angiogram was performed, with roadmap achieved. Exchange length Rosen wire was then passed through the diagnostic catheter to the distal cervical ICA and the diagnostic catheter was removed. The 5 French sheath was removed, with attempt at placing 8 French 55 cm bright tip sheath. Significant resistance was encountered with passing the sheath through the right iliac system. The sheath would not advanced through the pre-existing right common iliac artery stent. Sheath was withdrawn and rotated. We attempted to advance the sheath over the introducer which was pinned on the wire. We also removed the introducer in place to diagnostic catheter into the aorta and attempted to pass the sheath over the diagnostic catheter. None of these maneuvers were successful. Sheath was then withdrawn into the external iliac artery. Balloon angioplasty was performed of the pre-existing right common iliac artery stent with 5 mm x 40 mm standard balloon angioplasty. Balloon was removed and the introducer was then replaced. Eight French sheath was then again attempted to pass over the wire, unsuccessful through this segment of the iliac artery. The introducer was removed, diagnostic catheter was placed into the cervical  segment of the internal carotid artery, and the Rose an wire was exchanged for a 260 cm Amplatz wire. With the stiff wire in place, another attempt was made to pass the sheath which was unsuccessful. Sheath was then again withdrawn to the external iliac artery, introducer was removed, and a second balloon angioplasty 6 mm x 40 mm was performed at the pre-existing stent. The sheath was then successfully advanced over the balloon as the balloon was deflated, into the abdominal aorta. The introducer was again placed after removing the balloon and the sheath was advanced 6 successfully into the aorta. Introducer was withdrawn. Once the sheath was advanced over the wire to the thoracic aorta, sheath was flushed and attached to pressurized and heparinized saline bag for constant forward flow. Eight French 95 cm flow gate balloon catheter was then advanced over the wire to the distal cervical segment. Wire was removed. Then a coaxial intermediate catheter and microcatheter combination was prepared on the back table. This combination was penumbra Ace 64 catheter and a Trevo Provue18 microcatheter, with a synchro soft wire. This combination was then advanced through the balloon guide into the ICA. System was advanced into the internal carotid artery,  to the level of the occlusion. The micro wire was then carefully advanced through the occluded segment, with a distal knuckle configuration. Microcatheter was then pushed through the occluded segment and the wire was removed. Intermediate catheter was advanced over the micro wire to the carotid siphon, which would not advance beyond the ophthalmic segment through the terminus. Blood was then aspirated through the hub of the microcatheter, and a gentle contrast injection was performed confirming intraluminal position. A rotating hemostatic valve was then attached to the back end of the microcatheter, and a pressurized and heparinized saline bag was attached to the catheter. 4 mm  x 40 mm solitaire device was then selected. Back flush was achieved at the rotating hemostatic valve, and then the device was gently advanced through the microcatheter to the distal end. The retriever was then unsheathed by withdrawing the microcatheter under fluoroscopy. Once the retriever was completely unsheathed, the microcatheter was carefully stripped from the delivery wire of the device. Control angiogram was then performed from the balloon catheter. 3 minute time interval was observed. The balloon on the balloon guide was then inflated to profile of the vessel. Constant aspiration was then performed through the intermediate catheter, and constant gentle aspiration was performed at the balloon guide. This aspiration was continued as the retriever was gently and slowly withdrawn with fluoroscopic observation into the distal intermediate catheter. The entire system was then gently withdrawn from the intracranial ICA and into the balloon guide. Once the retriever was entirely removed from the system, free aspiration was confirmed at the hub of the balloon guide, with free blood return confirmed. Control angiogram was then performed. TICI 2a flow was achieved. Repeat angiogram with multiple angulation demonstrated persistent occlusion of parietal branch. The same coaxial system was again passed through the balloon guide catheter. The microcatheter system was then advanced through the occlusion of the parietal branch. Once the micro wire microcatheter were beyond the occlusion, the micro wire was removed and stentreiver device was deployed, stripping the microcatheter from the wire. After the device was deployed across the occlusion, the intermediate catheter was again attempted to advanced to the M1 segment. This was unsuccessful, and would not advance beyond the ophthalmic segment. The balloon at the balloon guide was inflated to profile of the vessel. Local aspiration was performed at the intermediate catheter  upon withdrawal of the device under fluoroscopic observation, with aspiration at the balloon guide. Once the retrieve her and intermediate catheter were entirely removed from the system, free aspiration was confirmed at the hub of the intermediate catheter, with free blood return confirmed. Control angiogram was again performed. Improved flow was confirmed, TICI 2b. Balloon guide was withdrawn and angiogram of the cervical segment was performed. We then removed the balloon guide catheter and deployed an 8 French Angio-Seal at the right common femoral artery access. Angiogram was performed of the right sided access and the left-sided access before completing the case. Patient tolerated the procedure well and remained hemodynamically stable throughout. No complications were encountered. Estimated blood loss approximately 50 cc. IMPRESSION: Status post ultrasound guided access right common femoral artery for cerebral angiogram and a combination of aspiration and mechanical thrombectomy of left M1 emergent large vessel occlusion, and restoration of TICI 2b flow after 2 passes. Status post balloon angioplasty to 6 mm of proximal right common iliac artery for treatment of a restrictive lesion in order to pass 8 French sheath to perform the thrombectomy. Status post deployment of Angio-Seal at the right common femoral artery.  At the conclusion of the case, a 4 [place] sheath remains in left common femoral artery for arterial monitoring. Signed, Dulcy Fanny. Dellia Nims, RPVI [facility] Radiology PLAN: Formal CT after the case Right hip straight overnight status post 8 French device and Angio-Seal closure Left common femoral artery for French sheath may be removed when hemodynamic monitoring is no longer needed with manual pressure. Goal blood pressure 086 [id number] systolic given the incomplete flow restoration Electronically Signed   By: Corrie Mckusick D.O.   On: 04/03/2018  20:39   Ir US Guide Vasc Access Right  Result Date: 04/05/2018 INDICATION: 73 year old female with a history of acute left MCA stroke. She presents within time frame for IV tPA, and is a candidate for mechanical thrombectomy EXAM: ULTRASOUND GUIDED ACCESS RIGHT COMMON FEMORAL ARTERY ULTRASOUND GUIDED ACCESS LEFT COMMON FEMORAL ARTERY FOR ARTERIAL MONITORING ANGIOGRAM RIGHT LOWER EXTREMITY BALLOON ANGIOPLASTY STENOSIS RIGHT COMMON ILIAC ARTERY IN ORDER TO PASS 8 FRENCH SHEATH TO COMPLETE THE THROMBECTOMY CEREBRAL ANGIOGRAM MECHANICAL THROMBECTOMY LEFT MCA EMERGENT LARGE VESSEL OCCLUSION DEPLOYMENT OF ANGIO-SEAL RIGHT COMMON FEMORAL ARTERY COMPARISON:  CT imaging of the same day MEDICATIONS: 2 g Ancef IV. The antibiotic was administered within 1 hour of the procedure ANESTHESIA/SEDATION: General endotracheal tube anesthesia CONTRAST:  96 cc Isovue-300 FLUOROSCOPY TIME:  Fluoroscopy Time: 26 minutes 24 seconds (1,362 mGy). COMPLICATIONS: None TECHNIQUE: Informed written consent was obtained from the patient's family after a thorough discussion of the procedural risks, benefits and alternatives. Specific risks discussed include: Bleeding, infection, contrast reaction, kidney injury/failure, need for further procedure/surgery, arterial injury or dissection, embolization to new territory, intracranial hemorrhage (10-15% risk), neurologic deterioration, cardiopulmonary collapse, death. All questions were addressed. Maximal Sterile Barrier Technique was utilized including during the procedure including caps, mask, sterile gowns, sterile gloves, sterile drape, hand hygiene and skin antiseptic. A timeout was performed prior to the initiation of the procedure. The anesthesia team was present to provide general endotracheal tube anesthesia and for patient monitoring during the procedure. Interventional neuro radiology nursing staff was also present. FINDINGS: Initial Findings: Left common carotid artery: No significant  stenosis of the left common carotid artery, with a unremarkable course caliber and contour. Surgical changes of prior endarterectomy evident on prior CT imaging. Left external carotid artery: Patent with antegrade flow. Left internal carotid artery: Normal course caliber and contour of the cervical portion. Vertical and petrous segment patent with normal course caliber contour. Cavernous segment patent. Clinoid segment patent. Antegrade flow of the ophthalmic artery. Ophthalmic segment patent. Terminus patent. Left MCA: Distal M1 occluded at the initiation of the case. There is partial filling of an inferior branch which is the non dominant branch to the frontal. Patent fetal PCA to the left P2 segment. Left ACA: A 1 segment patent. A 2 segment perfuses the right territory. Completion Findings: Left MCA: After 2 passes of mechanical thrombectomy plus aspiration, there is near complete restoration of flow through the left MCA territory, with slow flow through an M3/M4 branch of the parietal region compatible with TI CI 2b flow. 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 Physical exam At the initiation of the case, palpable bilateral common femoral artery pulses present No palpable distal pulses Doppler positive pulses of the right posterior tibial and anterior tibial (with systolic blood pressures above 200) Doppler positive pulse at the left posterior tibial (with systolic blood pressure above 200), with no Doppler signal at the left anterior tibial At completion of case Doppler  positive pulses were only discovered on the right posterior tibial and anterior tibial arteries with blood pressures above 200. In the 140-160 range (goal) Doppler signal was not evident A completion of case Doppler positive pulses were only discovered on the left at the posterior tibial and not the left anterior tibial (with the pressure above 283 systolic). PROCEDURE: Patient is brought emergently to  the neuro angiography suite, with the patient identified appropriately and placed supine position on the table. Left radial arterial line was attempted by the anesthesia team. The patient is then prepped and draped in the usual sterile fashion. Ultrasound survey of the right inguinal region was performed with images stored and sent to PACs. 11 blade scalpel was used to make a small incision. Blunt dissection was performed. A micropuncture needle was used access the right common femoral artery under ultrasound. With excellent arterial blood flow returned, an .018 micro wire was passed through the needle, observed to enter the abdominal aorta under fluoroscopy. The needle was removed, and a micropuncture sheath was placed over the wire. The inner dilator and wire were removed, and an 035 Bentson wire was advanced under fluoroscopy into the abdominal aorta. The sheath was removed and a standard 5 [place] vascular sheath was placed. The dilator was removed and the sheath was flushed. Ultrasound survey of the left inguinal region was then performed with images stored and sent to PACs, confirming patency of the vessel. A micropuncture needle was used access the left common femoral artery under ultrasound. With excellent arterial blood flow returned, and an .018 micro wire was passed through the needle, observed enter the abdominal aorta under fluoroscopy. The needle was removed, and a micropuncture sheath was placed over the wire. The inner dilator and wire were removed, and an 035 Bentson wire was advanced under fluoroscopy into the abdominal aorta. The sheath was removed and a standard 4 [place] vascular sheath was placed for arterial monitoring. The dilator was removed and the sheath was flushed. A 59F JB-1 diagnostic catheter was then advanced over the wire through the existing right femoral access to the proximal descending thoracic aorta. Wire was then removed. Double flush of the catheter was performed. Catheter was  then used to select the left cervical internal carotid artery. Formal angiogram was performed, with roadmap achieved. Exchange length Rosen wire was then passed through the diagnostic catheter to the distal cervical ICA and the diagnostic catheter was removed. The 5 French sheath was removed, with attempt at placing 8 French 55 cm bright tip sheath. Significant resistance was encountered with passing the sheath through the right iliac system. The sheath would not advanced through the pre-existing right common iliac artery stent. Sheath was withdrawn and rotated. We attempted to advance the sheath over the introducer which was pinned on the wire. We also removed the introducer in place to diagnostic catheter into the aorta and attempted to pass the sheath over the diagnostic catheter. None of these maneuvers were successful. Sheath was then withdrawn into the external iliac artery. Balloon angioplasty was performed of the pre-existing right common iliac artery stent with 5 mm x 40 mm standard balloon angioplasty. Balloon was removed and the introducer was then replaced. Eight French sheath was then again attempted to pass over the wire, unsuccessful through this segment of the iliac artery. The introducer was removed, diagnostic catheter was placed into the cervical segment of the internal carotid artery, and the Rose an wire was exchanged for a 260 cm Amplatz wire. With the stiff  wire in place, another attempt was made to pass the sheath which was unsuccessful. Sheath was then again withdrawn to the external iliac artery, introducer was removed, and a second balloon angioplasty 6 mm x 40 mm was performed at the pre-existing stent. The sheath was then successfully advanced over the balloon as the balloon was deflated, into the abdominal aorta. The introducer was again placed after removing the balloon and the sheath was advanced 6 successfully into the aorta. Introducer was withdrawn. Once the sheath was advanced  over the wire to the thoracic aorta, sheath was flushed and attached to pressurized and heparinized saline bag for constant forward flow. Eight French 95 cm flow gate balloon catheter was then advanced over the wire to the distal cervical segment. Wire was removed. Then a coaxial intermediate catheter and microcatheter combination was prepared on the back table. This combination was penumbra Ace 64 catheter and a Trevo Provue18 microcatheter, with a synchro soft wire. This combination was then advanced through the balloon guide into the ICA. System was advanced into the internal carotid artery, to the level of the occlusion. The micro wire was then carefully advanced through the occluded segment, with a distal knuckle configuration. Microcatheter was then pushed through the occluded segment and the wire was removed. Intermediate catheter was advanced over the micro wire to the carotid siphon, which would not advance beyond the ophthalmic segment through the terminus. Blood was then aspirated through the hub of the microcatheter, and a gentle contrast injection was performed confirming intraluminal position. A rotating hemostatic valve was then attached to the back end of the microcatheter, and a pressurized and heparinized saline bag was attached to the catheter. 4 mm x 40 mm solitaire device was then selected. Back flush was achieved at the rotating hemostatic valve, and then the device was gently advanced through the microcatheter to the distal end. The retriever was then unsheathed by withdrawing the microcatheter under fluoroscopy. Once the retriever was completely unsheathed, the microcatheter was carefully stripped from the delivery wire of the device. Control angiogram was then performed from the balloon catheter. 3 minute time interval was observed. The balloon on the balloon guide was then inflated to profile of the vessel. Constant aspiration was then performed through the intermediate catheter, and  constant gentle aspiration was performed at the balloon guide. This aspiration was continued as the retriever was gently and slowly withdrawn with fluoroscopic observation into the distal intermediate catheter. The entire system was then gently withdrawn from the intracranial ICA and into the balloon guide. Once the retriever was entirely removed from the system, free aspiration was confirmed at the hub of the balloon guide, with free blood return confirmed. Control angiogram was then performed. TICI 2a flow was achieved. Repeat angiogram with multiple angulation demonstrated persistent occlusion of parietal branch. The same coaxial system was again passed through the balloon guide catheter. The microcatheter system was then advanced through the occlusion of the parietal branch. Once the micro wire microcatheter were beyond the occlusion, the micro wire was removed and stentreiver device was deployed, stripping the microcatheter from the wire. After the device was deployed across the occlusion, the intermediate catheter was again attempted to advanced to the M1 segment. This was unsuccessful, and would not advance beyond the ophthalmic segment. The balloon at the balloon guide was inflated to profile of the vessel. Local aspiration was performed at the intermediate catheter upon withdrawal of the device under fluoroscopic observation, with aspiration at the balloon guide. Once the retrieve  her and intermediate catheter were entirely removed from the system, free aspiration was confirmed at the hub of the intermediate catheter, with free blood return confirmed. Control angiogram was again performed. Improved flow was confirmed, TICI 2b. Balloon guide was withdrawn and angiogram of the cervical segment was performed. We then removed the balloon guide catheter and deployed an 8 French Angio-Seal at the right common femoral artery access. Angiogram was performed of the right sided access and the left-sided access before  completing the case. Patient tolerated the procedure well and remained hemodynamically stable throughout. No complications were encountered. Estimated blood loss approximately 50 cc. IMPRESSION: Status post ultrasound guided access right common femoral artery for cerebral angiogram and a combination of aspiration and mechanical thrombectomy of left M1 emergent large vessel occlusion, and restoration of TICI 2b flow after 2 passes. Status post balloon angioplasty to 6 mm of proximal right common iliac artery for treatment of a restrictive lesion in order to pass 8 French sheath to perform the thrombectomy. Status post deployment of Angio-Seal at the right common femoral artery. At the conclusion of the case, a 4 [place] sheath remains in left common femoral artery for arterial monitoring. Signed, Dulcy Fanny. Dellia Nims, RPVI [facility] Inter[facility] Radiology [facility] Radiology PLAN: Formal CT after the case Right hip straight overnight status post 8 French device and Angio-Seal closure Left common femoral artery for French sheath may be removed when hemodynamic monitoring is no longer needed with manual pressure. Goal blood pressure 166 [id number] systolic given the incomplete flow restoration Electronically Signed   By: Corrie Mckusick D.O.   On: 03/11/2018 20:39   Dg Chest Port 1 View  Result Date: 03/13/2018 CLINICAL DATA:  Central line placement EXAM: PORTABLE CHEST 1 VIEW COMPARISON:  Chest radiograph from earlier today. FINDINGS: Endotracheal tube tip is 1.8 cm above the carina. Enteric tube enters stomach with the tip not seen on this image. Right internal jugular central venous catheter terminates in the middle third of the SVC. Stable cardiomediastinal silhouette with mild cardiomegaly. No pneumothorax. No pleural effusion. Hazy parahilar lung opacities are stable. IMPRESSION: 1. Right internal jugular central venous catheter terminates in the middle third of the SVC. No pneumothorax. 2.  Endotracheal tube tip 1.8 cm above the carina. 3. Stable mild cardiomegaly. Stable hazy parahilar lung opacities, favor pulmonary edema. Electronically Signed   By: Ilona Sorrel M.D.   On: 03/13/2018 15:14   Dg Chest Port 1 View  Result Date: 03/13/2018 CLINICAL DATA:  Endotracheal tube EXAM: PORTABLE CHEST 1 VIEW COMPARISON:  03/12/2018 FINDINGS: The heart is moderately enlarged. NG tube is stable. Endotracheal tube has been retracted and the tip is now 3.2 cm from the carina. There is hazy pulmonary opacity bilaterally which is central and basilar likely combination of pleural fluid and airspace disease. No pneumothorax. Vascular congestion. IMPRESSION: Stable bilateral pleural effusions and bilateral hazy airspace disease. Electronically Signed   By: Marybelle Killings M.D.   On: 03/13/2018 07:47   Dg Chest Port 1 View  Result Date: 03/12/2018 CLINICAL DATA:  Endotracheal tube placement. EXAM: PORTABLE CHEST 1 VIEW COMPARISON:  Chest radiograph performed 03/11/2018 FINDINGS: The patient's endotracheal tube is seen ending 1-2 cm above the carina. This could be retracted 2 cm. The patient's enteric tube is noted extending below the diaphragm. Small bilateral pleural effusions are noted. Hazy bilateral airspace opacities raise concern for pulmonary edema. No pneumothorax is seen. The cardiomediastinal silhouette is mildly enlarged. No acute osseous abnormalities are identified. IMPRESSION:  1. Endotracheal tube seen ending 1-2 cm above the carina. This could be retracted 2 cm. 2. Small bilateral pleural effusions noted. Hazy bilateral airspace opacities raise concern for pulmonary edema. 3. Mild cardiomegaly. Electronically Signed   By: Garald Balding M.D.   On: 03/12/2018 06:38   Dg Chest Port 1 View  Result Date: 03/11/2018 CLINICAL DATA:  Acute respiratory failure with hypoxia. EXAM: PORTABLE CHEST 1 VIEW COMPARISON:  03/10/2018 FINDINGS: Endotracheal tube has tip 5.6 cm above the carina. Nasogastric tube  unchanged with tip over the stomach in the left upper quadrant. Lungs are adequately inflated with minimal persistent left retrocardiac opacification. Mild stable cardiomegaly. Remainder of the exam is unchanged. IMPRESSION: Mild left retrocardiac opacification unchanged likely atelectasis. Mild stable cardiomegaly. Tubes and lines as described. Electronically Signed   By: Marin Olp M.D.   On: 03/11/2018 08:17   Dg Chest Port 1 View  Result Date: 03/10/2018 CLINICAL DATA:  Cerebral infarction and respiratory failure. EXAM: PORTABLE CHEST 1 VIEW COMPARISON:  03/12/2018 FINDINGS: Endotracheal tube remains present with the tip approximately 3 cm above the carina. Gastric decompression tube has been advanced and now extends further into the stomach. Stable significant cardiac enlargement. Lungs show some improved aeration at the right base with mild residual right basilar atelectasis remaining. Left lower lobe atelectasis is relatively stable. No pulmonary edema, significant pleural fluid or pneumothorax. IMPRESSION: Advancement of gastric decompression tube further into the stomach. Improved aeration at the right lung base with residual mild right basilar atelectasis. Stable left lower lobe atelectasis. Electronically Signed   By: Aletta Edouard M.D.   On: 03/10/2018 08:18   Portable Chest X-ray  Result Date: 03/25/2018 CLINICAL DATA:  Intubated EXAM: PORTABLE CHEST 1 VIEW COMPARISON:  06/01/2017 chest radiograph. FINDINGS: Endotracheal tube tip is 4.2 cm above the carina. Enteric tube terminates at the esophagogastric junction with the side port in the lower thoracic esophagus. Stable cardiomediastinal silhouette with moderate cardiomegaly. No pneumothorax. No pleural effusion. Hazy lower parahilar lung opacities, most prominent in the right lower lung, worsened. IMPRESSION: 1. Well-positioned endotracheal tube. 2. Enteric tube terminates at the esophagogastric junction with the side port in the lower  thoracic esophagus, consider advancing 8-10 cm. 3. Moderate cardiomegaly. Worsening hazy lower parahilar lung opacities, most prominent in the right lower lung, favor pulmonary edema. Electronically Signed   By: Ilona Sorrel M.D.   On: 03/31/2018 22:12   Ir Percutaneous Art Thrombectomy/infusion Intracranial Inc Diag Angio  Result Date: 04/04/2018 INDICATION: 73 year old female with a history of acute left MCA stroke. She presents within time frame for IV tPA, and is a candidate for mechanical thrombectomy EXAM: ULTRASOUND GUIDED ACCESS RIGHT COMMON FEMORAL ARTERY ULTRASOUND GUIDED ACCESS LEFT COMMON FEMORAL ARTERY FOR ARTERIAL MONITORING ANGIOGRAM RIGHT LOWER EXTREMITY BALLOON ANGIOPLASTY STENOSIS RIGHT COMMON ILIAC ARTERY IN ORDER TO PASS 8 FRENCH SHEATH TO COMPLETE THE THROMBECTOMY CEREBRAL ANGIOGRAM MECHANICAL THROMBECTOMY LEFT MCA EMERGENT LARGE VESSEL OCCLUSION DEPLOYMENT OF ANGIO-SEAL RIGHT COMMON FEMORAL ARTERY COMPARISON:  CT imaging of the same day MEDICATIONS: 2 g Ancef IV. The antibiotic was administered within 1 hour of the procedure ANESTHESIA/SEDATION: General endotracheal tube anesthesia CONTRAST:  96 cc Isovue-300 FLUOROSCOPY TIME:  Fluoroscopy Time: 26 minutes 24 seconds (1,362 mGy). COMPLICATIONS: None TECHNIQUE: Informed written consent was obtained from the patient's family after a thorough discussion of the procedural risks, benefits and alternatives. Specific risks discussed include: Bleeding, infection, contrast reaction, kidney injury/failure, need for further procedure/surgery, arterial injury or dissection, embolization to new territory, intracranial hemorrhage (  10-15% risk), neurologic deterioration, cardiopulmonary collapse, death. All questions were addressed. Maximal Sterile Barrier Technique was utilized including during the procedure including caps, mask, sterile gowns, sterile gloves, sterile drape, hand hygiene and skin antiseptic. A timeout was performed prior to the initiation  of the procedure. The anesthesia team was present to provide general endotracheal tube anesthesia and for patient monitoring during the procedure. Interventional neuro radiology nursing staff was also present. 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 Surgical changes of prior endarterectomy evident on prior CT imaging. Left external carotid artery: Patent with antegrade flow. Left internal carotid artery: Normal course caliber and contour of the cervical portion. Vertical and petrous segment patent with normal course caliber contour. Cavernous segment patent. Clinoid segment patent. Antegrade flow of the ophthalmic artery. Ophthalmic segment patent. Terminus patent. Left MCA: Distal M1 occluded at the initiation of the case. There is partial filling of an inferior branch which is the non dominant branch to the frontal. Patent fetal PCA to the left P2 segment. Left ACA: A 1 segment patent. A 2 segment perfuses the right territory. Completion Findings: Left MCA: After 2 passes of mechanical thrombectomy plus aspiration, there is near complete restoration of flow through the left MCA territory, with slow flow through an M3/M4 branch of the parietal region compatible with TI CI 2b flow. 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 Physical exam At the initiation of the case, palpable bilateral common femoral artery pulses present No palpable distal pulses Doppler positive pulses of the right posterior tibial and anterior tibial (with systolic blood pressures above 200) Doppler positive pulse at the left posterior tibial (with systolic blood pressure above 200), with no Doppler signal at the left anterior tibial At completion of case Doppler positive pulses were only discovered on the right posterior tibial and anterior tibial arteries with blood pressures above  200. In the 140-160 range (goal) Doppler signal was not evident A completion of case Doppler positive pulses were only discovered on the left at the posterior tibial and not the left anterior tibial (with the pressure above 595 systolic). PROCEDURE: Patient is brought emergently to the neuro angiography suite, with the patient identified appropriately and placed supine position on the table. Left radial arterial line was attempted by the anesthesia team. The patient is then prepped and draped in the usual sterile fashion. Ultrasound survey of the right inguinal region was performed with images stored and sent to PACs. 11 blade scalpel was used to make a small incision. Blunt dissection was performed. A micropuncture needle was used access the right common femoral artery under ultrasound. With excellent arterial blood flow returned, an .018 micro wire was passed through the needle, observed to enter the abdominal aorta under fluoroscopy. The needle was removed, and a micropuncture sheath was placed over the wire. The inner dilator and wire were removed, and an 035 Bentson wire was advanced under fluoroscopy into the abdominal aorta. The sheath was removed and a standard 5 [place] vascular sheath was placed. The dilator was removed and the sheath was flushed. Ultrasound survey of the left inguinal region was then performed with images stored and sent to PACs, confirming patency of the vessel. A micropuncture needle was used access the left common femoral artery under ultrasound. With excellent arterial blood flow returned, and an .018 micro wire was passed through the needle, observed enter the abdominal aorta under fluoroscopy.  The needle was removed, and a micropuncture sheath was placed over the wire. The inner dilator and wire were removed, and an 035 Bentson wire was advanced under fluoroscopy into the abdominal aorta. The sheath was removed and a standard 4 [place] vascular sheath was placed for arterial  monitoring. The dilator was removed and the sheath was flushed. A 75F JB-1 diagnostic catheter was then advanced over the wire through the existing right femoral access to the proximal descending thoracic aorta. Wire was then removed. Double flush of the catheter was performed. Catheter was then used to select the left cervical internal carotid artery. Formal angiogram was performed, with roadmap achieved. Exchange length Rosen wire was then passed through the diagnostic catheter to the distal cervical ICA and the diagnostic catheter was removed. The 5 French sheath was removed, with attempt at placing 8 French 55 cm bright tip sheath. Significant resistance was encountered with passing the sheath through the right iliac system. The sheath would not advanced through the pre-existing right common iliac artery stent. Sheath was withdrawn and rotated. We attempted to advance the sheath over the introducer which was pinned on the wire. We also removed the introducer in place to diagnostic catheter into the aorta and attempted to pass the sheath over the diagnostic catheter. None of these maneuvers were successful. Sheath was then withdrawn into the external iliac artery. Balloon angioplasty was performed of the pre-existing right common iliac artery stent with 5 mm x 40 mm standard balloon angioplasty. Balloon was removed and the introducer was then replaced. Eight French sheath was then again attempted to pass over the wire, unsuccessful through this segment of the iliac artery. The introducer was removed, diagnostic catheter was placed into the cervical segment of the internal carotid artery, and the Rose an wire was exchanged for a 260 cm Amplatz wire. With the stiff wire in place, another attempt was made to pass the sheath which was unsuccessful. Sheath was then again withdrawn to the external iliac artery, introducer was removed, and a second balloon angioplasty 6 mm x 40 mm was performed at the pre-existing stent.  The sheath was then successfully advanced over the balloon as the balloon was deflated, into the abdominal aorta. The introducer was again placed after removing the balloon and the sheath was advanced 6 successfully into the aorta. Introducer was withdrawn. Once the sheath was advanced over the wire to the thoracic aorta, sheath was flushed and attached to pressurized and heparinized saline bag for constant forward flow. Eight French 95 cm flow gate balloon catheter was then advanced over the wire to the distal cervical segment. Wire was removed. Then a coaxial intermediate catheter and microcatheter combination was prepared on the back table. This combination was penumbra Ace 64 catheter and a Trevo Provue18 microcatheter, with a synchro soft wire. This combination was then advanced through the balloon guide into the ICA. System was advanced into the internal carotid artery, to the level of the occlusion. The micro wire was then carefully advanced through the occluded segment, with a distal knuckle configuration. Microcatheter was then pushed through the occluded segment and the wire was removed. Intermediate catheter was advanced over the micro wire to the carotid siphon, which would not advance beyond the ophthalmic segment through the terminus. Blood was then aspirated through the hub of the microcatheter, and a gentle contrast injection was performed confirming intraluminal position. A rotating hemostatic valve was then attached to the back end of the microcatheter, and a pressurized and heparinized saline  bag was attached to the catheter. 4 mm x 40 mm solitaire device was then selected. Back flush was achieved at the rotating hemostatic valve, and then the device was gently advanced through the microcatheter to the distal end. The retriever was then unsheathed by withdrawing the microcatheter under fluoroscopy. Once the retriever was completely unsheathed, the microcatheter was carefully stripped from the  delivery wire of the device. Control angiogram was then performed from the balloon catheter. 3 minute time interval was observed. The balloon on the balloon guide was then inflated to profile of the vessel. Constant aspiration was then performed through the intermediate catheter, and constant gentle aspiration was performed at the balloon guide. This aspiration was continued as the retriever was gently and slowly withdrawn with fluoroscopic observation into the distal intermediate catheter. The entire system was then gently withdrawn from the intracranial ICA and into the balloon guide. Once the retriever was entirely removed from the system, free aspiration was confirmed at the hub of the balloon guide, with free blood return confirmed. Control angiogram was then performed. TICI 2a flow was achieved. Repeat angiogram with multiple angulation demonstrated persistent occlusion of parietal branch. The same coaxial system was again passed through the balloon guide catheter. The microcatheter system was then advanced through the occlusion of the parietal branch. Once the micro wire microcatheter were beyond the occlusion, the micro wire was removed and stentreiver device was deployed, stripping the microcatheter from the wire. After the device was deployed across the occlusion, the intermediate catheter was again attempted to advanced to the M1 segment. This was unsuccessful, and would not advance beyond the ophthalmic segment. The balloon at the balloon guide was inflated to profile of the vessel. Local aspiration was performed at the intermediate catheter upon withdrawal of the device under fluoroscopic observation, with aspiration at the balloon guide. Once the retrieve her and intermediate catheter were entirely removed from the system, free aspiration was confirmed at the hub of the intermediate catheter, with free blood return confirmed. Control angiogram was again performed. Improved flow was confirmed, TICI 2b.  Balloon guide was withdrawn and angiogram of the cervical segment was performed. We then removed the balloon guide catheter and deployed an 8 French Angio-Seal at the right common femoral artery access. Angiogram was performed of the right sided access and the left-sided access before completing the case. Patient tolerated the procedure well and remained hemodynamically stable throughout. No complications were encountered. Estimated blood loss approximately 50 cc. IMPRESSION: Status post ultrasound guided access right common femoral artery for cerebral angiogram and a combination of aspiration and mechanical thrombectomy of left M1 emergent large vessel occlusion, and restoration of TICI 2b flow after 2 passes. Status post balloon angioplasty to 6 mm of proximal right common iliac artery for treatment of a restrictive lesion in order to pass 8 French sheath to perform the thrombectomy. Status post deployment of Angio-Seal at the right common femoral artery. At the conclusion of the case, a 4 [place] sheath remains in left common femoral artery for arterial monitoring. Signed, Dulcy Fanny. Dellia Nims, RPVI [facility] Radiology PLAN: Formal CT after the case Right hip straight overnight status post 8 French device and Angio-Seal closure Left common femoral artery for French sheath may be removed when hemodynamic monitoring is no longer needed with manual pressure. Goal blood pressure 425 [id number] systolic given the incomplete flow restoration Electronically Signed   By: Corrie Mckusick D.O.   On: 03/26/2018 20:39   Ct  Head Code Stroke Wo Contrast  Result Date: 04/05/2018 CLINICAL DATA:  Code stroke.  Deficit not specified EXAM: CT HEAD WITHOUT CONTRAST TECHNIQUE: Contiguous axial images were obtained from the base of the skull through the vertex without intravenous contrast. COMPARISON:  Head CT 07/18/2016 and brain MRI 07/20/2016 FINDINGS: Brain: Multiple remote  lacunar infarcts in the deep white matter and deep gray nuclei, chronic when compared to prior head CT and brain MRI. Small remote left cerebellar infarct. No evidence of acute infarct. No hemorrhage or hydrocephalus. Vascular: No hyperdense vessel. Skull: Normal. Negative for fracture or focal lesion. Sinuses/Orbits: No acute finding. Other: These results were communicated to Dr. Rory Percy at 4:43 pmon 01/28/2020by text page via the [facility] messaging system. ASPECTS [facility] Stroke Program Early CT Score) Not scored without localizing symptoms. Motion artifact at the vertex blurring cortex. IMPRESSION: 1. No acute finding. 2. Motion artifact at the vertex. 3. Multiple remote small vessel infarcts. Electronically Signed   By: Monte Fantasia M.D.   On: 03/24/2018 16:45   Vas [place] Lower Extremity Venous (dvt)  Result Date: 03/09/2018  Lower Venous Study Indications: Stroke.  Limitations: Body habitus. Performing Technologist: Maudry Mayhew MHA, RDMS, RVT, RDCS  Examination Guidelines: A complete evaluation includes B-mode imaging, spectral Doppler, color Doppler, and power Doppler as needed of all accessible portions of each vessel. Bilateral testing is considered an integral part of a complete examination. Limited examinations for reoccurring indications may be performed as noted.  Right Venous Findings: +---------+---------------+---------+-----------+----------+--------------+          CompressibilityPhasicitySpontaneityPropertiesSummary        +---------+---------------+---------+-----------+----------+--------------+ CFV                                                   Not visualized +---------+---------------+---------+-----------+----------+--------------+ SFJ                                                   Not visualized +---------+---------------+---------+-----------+----------+--------------+ FV Prox  Full                                                         +---------+---------------+---------+-----------+----------+--------------+ FV Mid   Full                                                        +---------+---------------+---------+-----------+----------+--------------+ FV DistalFull                                                        +---------+---------------+---------+-----------+----------+--------------+ PFV      Full                                                        +---------+---------------+---------+-----------+----------+--------------+  POP      Full           Yes      Yes                                 +---------+---------------+---------+-----------+----------+--------------+ PTV      Full                                                        +---------+---------------+---------+-----------+----------+--------------+ PERO                                                  Not visualized +---------+---------------+---------+-----------+----------+--------------+  Left Venous Findings: +---------+---------------+---------+-----------+----------+-------+          CompressibilityPhasicitySpontaneityPropertiesSummary +---------+---------------+---------+-----------+----------+-------+ CFV      Full           Yes      Yes                          +---------+---------------+---------+-----------+----------+-------+ SFJ      Full                                                 +---------+---------------+---------+-----------+----------+-------+ FV Prox  Full                                                 +---------+---------------+---------+-----------+----------+-------+ FV Mid   Full                                                 +---------+---------------+---------+-----------+----------+-------+ FV DistalFull                                                 +---------+---------------+---------+-----------+----------+-------+ PFV      Full                                                  +---------+---------------+---------+-----------+----------+-------+ POP      Full           Yes      Yes                          +---------+---------------+---------+-----------+----------+-------+ PTV      Full                                                 +---------+---------------+---------+-----------+----------+-------+  PERO     Full                                                 +---------+---------------+---------+-----------+----------+-------+    Summary: Right: There is no evidence of deep vein thrombosis in the lower extremity. However, portions of this examination were limited- see technologist comments above. No cystic structure found in the popliteal fossa. Left: There is no evidence of deep vein thrombosis in the lower extremity. No cystic structure found in the popliteal fossa.  *See table(s) above for measurements and observations. Electronically signed by Curt Jews MD on 03/09/2018 at 5:40:37 PM.    Final     PHYSICAL EXAM  Temp:  [96.2 F (35.7 C)-99.1 F (37.3 C)] 96.7 F (35.9 C) (01/07 0800) Pulse Rate:  [54-77] 77 (01/07 1500) Resp:  [15-29] 24 (01/07 1500) BP: (168-190)/(41-59) 190/45 (01/07 1108) SpO2:  [94 %-100 %] 100 % (01/07 1500) Arterial Line BP: (138-198)/(37-124) 181/46 (01/07 1500) FiO2 (%):  [40 %] 40 % (01/07 1108) Weight:  [82.6 kg] 82.6 kg (01/07 0500)  General - Well nourished, well developed, intubated on sedation.  Ophthalmologic - fundi not visualized due to noncooperation.  Cardiovascular - Regular rate and rhythm.  Neuro - intubated now off  sedation,   she is stuporous and unresponsive and not following commands. Globally aphasic Eyes mid position, no deviation, however no doll's eyes.  Not blinking to visual threat bilaterally.  Pupil 2 mm bilaterally, reactive to light.  Left corneal present, right corneal weak reflexes.  Positive gag and cough.  Facial symmetry not able to test due to ET tube.   Left upper and lower extremity spontaneous movements against gravity2/5 on pain stimulation.  Right upper and lower extremity trace withdrawal to pain stimulaiton.  DTR 1+, Babinski negative. Sensation, coordination and gait not tested.   ASSESSMENT/PLAN Ms. SANGITA ZANI is a 73 y.o. female with history of stroke, hypertension, PVD, diabetes, hyperlipidemia, status post right CEA, CHF admitted for right-sided weakness and aphasia. TPA given.    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  However, cardioembolic source cannot be excluded.  Resultant intubated on sedation, right hemiparesis  CT showed multiple old lacunar infarcts  CTA head and neck left M1/M2 occlusion.  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  Right precentral gyrus petechial hemorrhage.  MRA left MCA patent with moderate M2 origin stenosis  CT head repeat shows slight increase in right precentral gyrus petechial hemorrhage given on DAPT  2D Echo EF 30 to 35% down from 40 to 45% in 05/2017  LE venous Doppler no DVT  LDL 92  HgbA1c 7.1  SCDs for VTE prophylaxis  aspirin 81 mg daily and clopidogrel 75 mg daily prior to admission, now on aspirin 81 and Plavix due to non-STEMI.   Ongoing aggressive stroke risk factor management  Therapy recommendations: pending  Disposition: Pending  Multi-large vessel severe atherosclerosis and stenosis  CTA head and neck showed bilateral siphon, right ICA and a better subclavian high-grade stenosis  Hypoplastic posterior circulation with  bilateral fetal PCAs  History of left CEA  History of PVD  History of CAD, following  with Dr. Einar Gip as outpatient  If patient has reasonable neurological functional outcome, may consider outpatient follow-up with vascular surgery for subclavian artery stenting  Non-STEMI and cardiomyopathy  Troponin 4.62-6.47-5.08-4.7-3.8-3.88  EF 30 to 35% down from 40 to 45% in 05/2017  Cardiology on board  No intervention recommended  Started on aspirin and Plavix  CT head repeat 03/11/18 shows slight increase in [facility] in  right precentral gyrus petechial hemorrhage given on DAPT  Tapered off nitroglycerin IV  AKI on CKD stage III  creatinine 2.20-2.25->2.97.4.68  CCM on board  BMP monitoring  continue IV fluid   Diabetes  HgbA1c 7.1 goal < 7.0  Uncontrolled  CBG monitoring  SSI  Hypertension . Stable . 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  Put on DAPT and Lipitor 80  Other Active Problems  Hypokalemia-supplement  Hyperglycemia   Hospital day # 6 Plan : Patient's condition Remains critical even though renal function is improved with dialysis but now liver enzymes elevated. Neurologically she remains unchanged..  I had a long discussion with the patient's daughter and Dr.Kloefkorn from critical care medicine and Dr Maryjane Hurter from nephrology. I spoke to the patient's son and daughter>they understand her poor prognosis but would like to support her for the next few days and agree with DO NOT RESUSCITATE and one-way extubation and no prolonged ventilatory support, trach, peg.This patient is critically ill due to left MCA stroke, severe multivessel atherosclerosis and stenosis, CHF, non-STEMI, hyperglycemia and at significant risk of  neurological worsening, death form recurrent stroke, hemorrhagic conversion, heart failure, seizure, DKA. This patient's care requires constant monitoring of vital signs, hemodynamics, respiratory and cardiac monitoring, review of multiple databases, neurological assessment, discussion with family, other specialists and medical decision making of high complexity. I spent 30 minutes of neurocritical care time in the care of this patient. I had long discussion with her daughter at bedside, updated pt current condition, treatment plan and potential prognosis.She expressed understanding and appreciation.    Antony Contras, MD Stroke Neurology 03/14/2018 3:36 PM    To contact Stroke Continuity provider, please refer to [email or web address] After hours, contact General Neurology

## 2018-03-14 NOTE — Progress Notes (Signed)
NAME:  Yvonne Patel, MRN:  [id number], DOB:  03-30-1945, LOS: 85 ADMISSION DATE:  03/25/2018, CONSULTATION DATE:  1/1 REFERRING MD:  Dr. Rory Percy , CHIEF COMPLAINT:  CVA   Brief History   73 year old female admitted with acute L MCA CVA s/p systemic TPA and mechanical thrombolysis.   Past Medical History  DM II TIA  Jehovah's witness > no blood products  HTN HLD CAD Cardiomyopathy  Significant Hospital Events   1/01 admit 1/03 CP, + troponin, Cardiology consulted.  Cleviprex, propofol.   1/04 Self extubated, reintubated for poor cough/airway protection 1/6: No improvement neurological exam, renal function worse, having bradycardic events. 1/7: CRRT initiated on 1/6.  Metabolic status regulated, acid-base improve, creatinine improved, glycemic control improved, lab data did suggest mild DKA anion gap now closed.  Planning on meeting with family later to discuss goals of care Consults:  Cardiology  Procedures:  VIR 1/1 >> procedure completed for left MCA occlusion at M1 segment. The procedure involved two passes of combination mechanical and aspiration thrombectomy with restoration of TICI 2b flow. L femoral art line 1/1 >> ETT 1/1 >>   Significant Diagnostic Tests:  CT head 1/1 > no acute finding, multiple remote small vessel infarcts CTA head/neck 1/1 > Left M2 branch occlusion beginning at the M1 bifurcation. Severe atherosclerosis which preferentially spares the left carotid circulation, question prior endarterectomy. Severe atheromatous narrowing if not occlusion of bilateral proximal subclavian arteries. There is bilateral fetal type PCA with diffuse thready flow in the posterior fossa. 70% atheromatous narrowing of the right common carotid and ICA bulb. CT head 1/1 (post IR) > New 3 mm high-density within the posterior right frontal cortex which could be focus of hemorrhage or an enhancing subacute infarct. No hemorrhage or infarct seen along the postoperative left MCA  distribution. TTE 1/1 >> LVEF 30-35%, mod global & severe inferior / inferolateral hypokinesis,  MRI Brain 1/2 >> moderate left MCA infarct, punctate acute infarcts involving the left caudate, right centrum semiovale, parasagittal right parietal cortex, bilateral cerebellar hemispheres.  4 mm hemorrhage right precentral gyrus.  Numerous microhemorrhages present basal ganglia both cerebral hemispheres, cerebellum. Advanced intracranial atherosclerosis, moderate ICA stenoses, chronic segmental occlusion basilar artery with decreased bilateral vertebral artery flow  Micro Data:  1/1 MRSA PCR  >> neg  Antimicrobials:  1/1 ancef preop  Interim history/subjective:  Seems to be tolerating dialysis Objective   Blood pressure (Abnormal) 184/44, pulse 69, temperature (Abnormal) 96.7 F (35.9 C), temperature source Axillary, resp. rate (Abnormal) 21, height 5\' 5"  (1.651 m), weight 82.6 kg, SpO2 100 %.    Vent Mode: PRVC FiO2 (%):  [40 %] 40 % Set Rate:  [16 bmp] 16 bmp Vt Set:  [450 mL] 450 mL PEEP:  [5 cmH20] 5 cmH20 Plateau Pressure:  [11 cmH20-17 cmH20] 13 cmH20   Intake/Output Summary (Last 24 hours) at 03/14/2018 1021 Last data filed at 03/14/2018 1000 Gross per 24 hour  Intake 1449.7 ml  Output 3162 ml  Net -1712.3 ml   Filed Weights   03/12/18 0449 03/13/18 0500 03/14/18 0500  Weight: 88.2 kg 83.3 kg 82.6 kg    Examination: General: 73 year old female resting on the ventilator appears more comfortable on pressure support ventilation HEENT normocephalic atraumatic orally intubated Pulmonary: Scattered rhonchi excellent tidal volume on pressure support ventilation Cardiac: Regular rate and rhythm Abdomen: Soft nontender Neuro: Awake, right-sided hemiparesis.  Not following commands today but does appear interactive GU: Minimal urine output  Resolved Hospital Problem list  Intermittent bradycardia, likely exacerbated by acidosis and beta-blockade resolved 1/7 Progressive anion gap  metabolic acidosis in setting of mild DKA resolved/1/7 Assessment & Plan:   Acute CVA:  Left M1/M2 distribution. S/p systemic TPA 1/1 at 1700, and Neuro IR thrombectomy.  Associated hemorrhage. P: Continue supportive care Serial neuro checks  Acute respiratory insufficency related to above  -failed self extubation 1/5 Portable chest x-ray personally reviewed demonstrates endotracheal tube and dialysis catheter in satisfactory position.  Scott basilar volume loss and hazy airspace disease which may represent an element of edema -Tolerating pressure support ventilation, actually looks more comfortable on this mode P: Continue pressure support as tolerated, okay to use this nocturnally as well VAP bundle Volume removal Assessing for extubation likely on 1/8 assuming metabolic derangements remain stable  Hypertension PAD -difficult to monitor BP due to severe subclavian stenosis P: Continuing hydralazine scheduled and as needed Goal systolic blood pressure less than 180  Chest pain NSTEMI - troponin  peaked Systolic HF - TTE 1/1 with EF 30-35% P: Continue telemetry monitoring Continuing aspirin Statin discontinued due to rising LFTs   Acute on chronic oliguric renal failure, suspect some component of hypoperfusion, dye load -Remains oliguric, -Seen by nephrology, baseline creatinine 1.5-1.8.  Started on CRRT on 1/6, creatinine improved, metabolic status improved. P: Continuing CRRT until filter occludes A.m. chemistry Avoid hypotension We will need to decide on intermittent dialysis Not a candidate for outpatient dialysis   Intermittent  fluid and electrolyte imbalance P: Continue to monitor blood chemistries  DM2, mild DKA resolved as of 1/7  p:  Continuing IV insulin for now  Best practice:  Diet: NPO Pain/Anxiety/Delirium protocol (if indicated): propofol RASS goal 0 to -1 VAP protocol (if indicated): per protocol DVT prophylaxis: SCD GI prophylaxis:  protonix Glucose control: ssi Mobility: BR Code Status: Full Family Communication:  Goals of care today.  Family leaning towards DO NOT RESUSCITATE.  Plan will be to fill out a MOST form later today  Critical care time:  33 minutes.      Erick Colace ACNP-BC [place] Pager # [phone] OR # [phone] if no answer

## 2018-03-14 NOTE — Progress Notes (Signed)
PT Cancellation Note  Patient Details Name: Yvonne Patel MRN: [id number] DOB: Jan 17, 1946   Cancelled Treatment:    Reason Eval/Treat Not Completed: Patient not medically ready. Pt remains on vent and is now on CRRT. PT to await for improved medical status and mobility progression orders to complete PT eval. PT to return as able, as appropriate.  Kittie Plater, PT, DPT Acute Rehabilitation Services Pager #: [phone] Office #: [phone]    Berline Lopes 03/14/2018, 6:51 AM

## 2018-03-14 NOTE — Progress Notes (Signed)
eLink Physician-Brief Progress Note Patient Name: Yvonne Patel DOB: Jun 14, 1945 MRN: [id number]   Date of Service  03/14/2018  HPI/Events of Note  Labs reviewed. AG has normalized. HCO3- = 21. Would like to see HCO3- closer to 24 prior to transition off insulin IV infusion.   eICU Interventions  Continue present management.      Intervention Category Major Interventions: Hyperglycemia - active titration of insulin therapy  Sommer,Steven Eugene 03/14/2018, 12:12 AM

## 2018-03-14 NOTE — Progress Notes (Addendum)
[facility] Progress Note    Assessment/ Plan:    73 y/o HTN PAD  DM p/w right sided weakness and aphasia found to have a left cerebellar and MCA infarct s/p tPA on vent.  1.Chronic kidney disease stage IV with a baseline serum creatinine around 1.5 to 1.8 mg/dL. I suspect this is on the basis of longstanding hypertension and diabetes.  2. Acute kidney injury. Likely on the basis of hemodynamics in the setting of an acute stroke, hypotension, and self extubation. Attempt to avoid hypotension. Check urinalysis for completeness. Remains oliguric.   Lengthy discussion with the family today.  They would like to pursue dialysis.  They do not think the patient would like long-term dialysis.  I  - May not have significant renal recovery and supporting with CRRT for now. BP appears to be stable and should be able to tolerate iHD.  - Will UF until filter is close to clotting and then transition to intermittent if needed. Family are aware that dialysis will not improve her kidney function or her neurologic function.  Certainly not a long term dialysis candidate.   Seen on CRRT Qb 150 Qd/pre/post 2000/500/500 through RIJ temp cath Net UF 100 ml/hr and tolerating w/ no hypotensive episodes noted  3. Hypertension.   Continue oral medications.  4. Acute left MCA CVA.Marland Kitchen No clear evidence of neurologic improvement but is awake and interactive with family.  Palliative care discussions ongoing.  Status post TPA and thrombectomy.    5. Systolic congestive heart failure. Continue goal-directed therapy for now, but keep ACE inhibitor on hold in the setting of acute kidney injury.  6.  Mixed gap and non-anion gap metabolic acidosis.  Likely combination of acute kidney injury.  Significant worsening of acidosis and hyperglycemia suggest a possible component of DKA.  Would add insulin therapy ? DKA - d/w PCM PA.  Subjective:   Intubated on vent, not responding to commands  this AM.   Objective:   BP (!) 174/41   Pulse 66   Temp (!) 96.2 F (35.7 C) (Axillary) Comment: bair hugger/blood warmer  Resp (!) 22   Ht _0  (1.651 m)   Wt 82.6 kg   SpO2 100%   BMI 30.30 kg/m   Intake/Output Summary (Last 24 hours) at 03/14/2018 2297 Last data filed at 03/14/2018 0700 Gross per 24 hour  Intake 1635.64 ml  Output 2506 ml  Net -870.36 ml   Weight change: -0.7 kg  Physical Exam: Genearl: Not responding to commands this am, on the ventilator, no acute distress HEENT: MMM [place] AT anicteric sclera Neck:  No JVD, no adenopathy CV:  Heart RRR  Lungs: Clear Abd:  abd SNT/ND with normal BS Extremities: +1 bilateral lower extremity edema Skin:  No skin rash  Imaging: Dg Abd 1 View  Result Date: 03/12/2018 CLINICAL DATA:  Orogastric tube placement.  Stroke. EXAM: ABDOMEN - 1 VIEW COMPARISON:  None. FINDINGS: Gastric decompression catheter extends into the stomach with the tip likely in the distal stomach near the antrum. No evidence of overt bowel obstruction or ileus. IMPRESSION: Orogastric tube extends into the stomach with the tip located in the distal stomach, likely near the antrum. Electronically Signed   By: Aletta Edouard M.D.   On: 03/12/2018 08:43   Dg Chest Port 1 View  Result Date: 03/13/2018 CLINICAL DATA:  Central line placement EXAM: PORTABLE CHEST 1 VIEW COMPARISON:  Chest radiograph from earlier today. FINDINGS: Endotracheal tube tip is 1.8 cm above the  carina. Enteric tube enters stomach with the tip not seen on this image. Right internal jugular central venous catheter terminates in the middle third of the SVC. Stable cardiomediastinal silhouette with mild cardiomegaly. No pneumothorax. No pleural effusion. Hazy parahilar lung opacities are stable. IMPRESSION: 1. Right internal jugular central venous catheter terminates in the middle third of the SVC. No pneumothorax. 2. Endotracheal tube tip 1.8 cm above the carina. 3. Stable mild cardiomegaly. Stable  hazy parahilar lung opacities, favor pulmonary edema. Electronically Signed   By: Ilona Sorrel M.D.   On: 03/13/2018 15:14   Dg Chest Port 1 View  Result Date: 03/13/2018 CLINICAL DATA:  Endotracheal tube EXAM: PORTABLE CHEST 1 VIEW COMPARISON:  03/12/2018 FINDINGS: The heart is moderately enlarged. NG tube is stable. Endotracheal tube has been retracted and the tip is now 3.2 cm from the carina. There is hazy pulmonary opacity bilaterally which is central and basilar likely combination of pleural fluid and airspace disease. No pneumothorax. Vascular congestion. IMPRESSION: Stable bilateral pleural effusions and bilateral hazy airspace disease. Electronically Signed   By: Marybelle Killings M.D.   On: 03/13/2018 07:47    Labs: BMET Recent Labs  Lab 03/10/18 0503  03/11/18 0535 03/11/18 1245 03/11/18 1700 03/12/18 0453 03/12/18 1615 03/13/18 0513 03/13/18 1027 03/13/18 1531 03/13/18 1839 03/13/18 2251 03/14/18 0200 03/14/18 0509  NA 141   < > 138  --   --  138  --  138 136 138 140 139 138 138  K 4.1   < > 4.7  --   --  4.8  --  5.0 5.2* 4.9 4.4 4.3 4.2 4.3  CL 110   < > 110  --   --  111  --  111 109 109 111 107 105 103  CO2 20*   < > 13*  --   --  15*  --  12* 9* 14* 15* 21* 21* 21*  GLUCOSE 171*   < > 188*  --   --  272*  --  300* 331* 370* 246* 177* 159* 181*  BUN 32*   < > 56*  --   --  83*  --  108* 113* 119* 99* 77* 66* 58*  CREATININE 2.97*   < > 4.68*  --   --  5.26*  --  6.24* 6.59* 6.09* 5.17* 3.88* 3.27* 2.91*  CALCIUM 8.1*   < > 7.6*  --   --  7.0*  --  6.5* 6.8* 6.4* 6.9* 7.3* 7.4* 7.3*  PHOS 6.0*  --  9.1* 8.2* 8.1* 7.5* 8.2*  --   --  7.8*  --   --   --   --    < > = values in this interval not displayed.   CBC Recent Labs  Lab 03/28/2018 1656  03/11/18 0535 03/12/18 0453 03/13/18 0513 03/14/18 0509  WBC 6.3   < > 12.1* 13.1* 13.0* 11.3*  NEUTROABS 3.2  --   --   --   --   --   HGB 12.2   < > 9.3* 8.9* 8.4* 8.6*  HCT 38.5   < > 28.9* 27.3* 26.3* 26.5*  MCV 94.1   <  > 95.4 95.5 95.6 93.0  PLT 174   < > 116* 106* 100* 110*   < > = values in this interval not displayed.    Medications:    .  stroke: mapping our early stages of recovery book   Does not apply Once  . aspirin  81 mg Per Tube Daily  . atorvastatin  80 mg Oral q1800  . chlorhexidine gluconate (MEDLINE KIT)  15 mL Mouth Rinse BID  . clopidogrel  75 mg Oral QHS  . feeding supplement (PRO-STAT SUGAR FREE 64)  30 mL Per Tube Daily  . feeding supplement (VITAL HIGH PROTEIN)  1,000 mL Per Tube Q24H  . ferrous sulfate  300 mg Per Tube BID  . mouth rinse  15 mL Mouth Rinse 10 times per day  . pantoprazole sodium  40 mg Per Tube Q1200      Otelia Santee, MD 03/14/2018, 7:23 AM

## 2018-03-14 NOTE — Progress Notes (Signed)
SLP Cancellation Note  Patient Details Name: Yvonne Patel MRN: [id number] DOB: [birth year]   Cancelled treatment:       Reason Eval/Treat Not Completed: Medical issues which prohibited therapy; remains on vent.  Will follow along for readiness.   [name] Laurice 03/14/2018, 11:27 AM

## 2018-03-14 NOTE — Progress Notes (Signed)
OT Cancellation Note  Patient Details Name: Yvonne Patel MRN: [id number] DOB: 31-Oct-1945   Cancelled Treatment:    Reason Eval/Treat Not Completed: Patient not medically ready;Active bedrest order. Patient remains on vent and is now on CRRT.  Will follow and initiate OT eval when medically appropriate and able.    Delight Stare, OT Acute Rehabilitation Services Pager [phone] Office [phone]   Delight Stare 03/14/2018, 7:31 AM

## 2018-03-14 NOTE — Progress Notes (Signed)
Nutrition Follow-up  INTERVENTION:   - Recommend bowel regimen as last bowel movement was PTA  Tube feeding via OG tube: - Vital AF 1.2 @ 55 ml/hr (1320 ml/day)  Tube feeding regimen provides 1584 kcal, 99 grams of protein, and 1069 ml of H2O (104% of kcal needs, 99% of protein needs).  NUTRITION DIAGNOSIS:   Inadequate oral intake related to inability to eat as evidenced by NPO status.  Ongoing  GOAL:   Patient will meet greater than or equal to 90% of their needs  Met via TF  MONITOR:   Vent status, Labs, Weight trends, I & O's  REASON FOR ASSESSMENT:   Ventilator    ASSESSMENT:   73 year old female who presented to the ED on 1/1 as code stroke. PMH significant for stroke, T2DM, syncope, PAD, HTN, HLD, CEA, cardiomyopathy, neuropathy. PT received TPA in the ED. CT showed left MCA acute ischemic stroke and taken for IR chemical thrombectomy.  1/4 - TF started 1/5 - pt self-extubated, later re-intubated due to inability to maintain airway 1/6 - CRRT started  Noted plan for one-way extubation on Friday when oldest son arrives.  Weight up 11 lbs since admission, likely related to positive fluid balance. Will continue to use weight on admission to estimate nutrition needs (88.2 kg). Pt now off CRRT.  Now that pt is off all sedation, will adjust TF to better meet pt's needs.  Patient is currently intubated on ventilator support MVe: 10.8 L/min Temp (24hrs), Avg:97.4 F (36.3 C), Min:96.2 F (35.7 C), Max:99.1 F (37.3 C) BP: 191/48 MAP: 91  Propofol: none Insulin frip: 7.7 ml/hr IVF (D5 in NS): 10 ml/hr  Current TF: Vital High Protein @ 45 ml/hr, Pro-stat 30 ml daily  Medications reviewed and include: Protonix  Labs reviewed: BUN 48 (H), creatinine 2.50 (H), elevated LFTs CBG's: 200, 191, 209, 243, 247, 227, 196, 172, 161, 134 x 24 hours  UOP: 267 ml x 24 hours CRRT: 2399 ml output x 24 hours I/O's: +5.1 L since admit  Diet Order:   Diet Order         Diet NPO time specified  Diet effective now              EDUCATION NEEDS:   Not appropriate for education at this time  Skin:  Skin Assessment: Reviewed RN Assessment  Last BM:  PTA/unknown  Height:   Ht Readings from Last 1 Encounters:  03/13/18 '5\' 5"'  (1.651 m)    Weight:   Wt Readings from Last 1 Encounters:  03/14/18 82.6 kg    Ideal Body Weight:  56.8 kg  BMI:  Body mass index is 30.3 kg/m.  Estimated Nutritional Needs:   Kcal:  1527  Protein:  100-115 grams  Fluid:  >/= 1.6 L    Gaynell Face, MS, RD, LDN Inpatient Clinical Dietitian Pager: [phone] Weekend/After Hours: [phone]

## 2018-03-14 NOTE — Progress Notes (Signed)
   Goals of care discussion:  Long discussion w/ family using the M.O.S.T form to outline discussion. We discussed current clinical findings and potential outcomes.   Summary -Do Not Resuscitate -plan for one way extubation (planned for Friday when oldest son arrives back in town) -No trach -No long term dialysis -post extubation; DNI in addition to DNR. Family would want attempt at BIPAP if tolerated but are prepared that could be decline or not be tolerated at which point transition to comfort would be considered.   They remain hopeful that she will continue to  Improve but are aware that she could easily decline and may need to transition to comfort.   Erick Colace ACNP-BC [place] Pager # [phone] OR # [phone] if no answer

## 2018-03-15 DIAGNOSIS — I255 Ischemic cardiomyopathy: Secondary | ICD-10-CM

## 2018-03-15 DIAGNOSIS — I6300 Cerebral infarction due to thrombosis of unspecified precerebral artery: Secondary | ICD-10-CM

## 2018-03-15 DIAGNOSIS — Z66 Do not resuscitate: Secondary | ICD-10-CM

## 2018-03-15 LAB — GLUCOSE, CAPILLARY
GLUCOSE-CAPILLARY: 255 mg/dL — AB (ref 70–99)
Glucose-Capillary: 162 mg/dL — ABNORMAL HIGH (ref 70–99)
Glucose-Capillary: 180 mg/dL — ABNORMAL HIGH (ref 70–99)
Glucose-Capillary: 284 mg/dL — ABNORMAL HIGH (ref 70–99)
Glucose-Capillary: 297 mg/dL — ABNORMAL HIGH (ref 70–99)
Glucose-Capillary: 322 mg/dL — ABNORMAL HIGH (ref 70–99)

## 2018-03-15 LAB — BASIC METABOLIC PANEL
Anion gap: 12 (ref 5–15)
Anion gap: 12 (ref 5–15)
BUN: 69 mg/dL — ABNORMAL HIGH (ref 8–23)
BUN: 83 mg/dL — ABNORMAL HIGH (ref 8–23)
CO2: 22 mmol/L (ref 22–32)
CO2: 20 mmol/L — ABNORMAL LOW (ref 22–32)
Calcium: 8 mg/dL — ABNORMAL LOW (ref 8.9–10.3)
Calcium: 7.8 mg/dL — ABNORMAL LOW (ref 8.9–10.3)
Chloride: 103 mmol/L (ref 98–111)
Chloride: 102 mmol/L (ref 98–111)
Creatinine, Ser: 3.35 mg/dL — ABNORMAL HIGH (ref 0.44–1.00)
Creatinine, Ser: 3.83 mg/dL — ABNORMAL HIGH (ref 0.44–1.00)
GFR calc Af Amer: 15 mL/min — ABNORMAL LOW (ref >60)
GFR calc Af Amer: 13 mL/min — ABNORMAL LOW (ref >60)
GFR calc non Af Amer: 13 mL/min — ABNORMAL LOW (ref >60)
GFR calc non Af Amer: 11 mL/min — ABNORMAL LOW (ref >60)
Glucose, Bld: 154 mg/dL — ABNORMAL HIGH (ref 70–99)
Glucose, Bld: 256 mg/dL — ABNORMAL HIGH (ref 70–99)
Potassium: 4.6 mmol/L (ref 3.5–5.1)
Potassium: 4.8 mmol/L (ref 3.5–5.1)
SODIUM: 137 mmol/L (ref 135–145)
SODIUM: 134 mmol/L — AB (ref 135–145)

## 2018-03-15 LAB — RENAL FUNCTION PANEL
Albumin: 1.7 g/dL — ABNORMAL LOW (ref 3.5–5.0)
Anion gap: 13 (ref 5–15)
BUN: 73 mg/dL — ABNORMAL HIGH (ref 8–23)
CO2: 21 mmol/L — ABNORMAL LOW (ref 22–32)
Calcium: 7.9 mg/dL — ABNORMAL LOW (ref 8.9–10.3)
Chloride: 101 mmol/L (ref 98–111)
Creatinine, Ser: 3.65 mg/dL — ABNORMAL HIGH (ref 0.44–1.00)
GFR calc Af Amer: 14 mL/min — ABNORMAL LOW (ref >60)
GFR, EST NON AFRICAN AMERICAN: 12 mL/min — AB (ref >60)
Glucose, Bld: 185 mg/dL — ABNORMAL HIGH (ref 70–99)
Phosphorus: 5.3 mg/dL — ABNORMAL HIGH (ref 2.5–4.6)
Potassium: 4.7 mmol/L (ref 3.5–5.1)
Sodium: 135 mmol/L (ref 135–145)

## 2018-03-15 LAB — MAGNESIUM: Magnesium: 2.5 mg/dL — ABNORMAL HIGH (ref 1.7–2.4)

## 2018-03-15 MED ORDER — INSULIN GLARGINE 100 UNIT/ML ~~LOC~~ SOLN
12.0000 [IU] | Freq: Every day | SUBCUTANEOUS | Status: DC
  Administered 2018-03-15 – 2018-03-16 (×2): 12 [IU] via SUBCUTANEOUS
  Filled 2018-03-15 (×3): qty 0.12

## 2018-03-15 MED ORDER — HYDRALAZINE HCL 25 MG PO TABS
25.0000 mg | ORAL_TABLET | Freq: Four times a day (QID) | ORAL | Status: DC
  Administered 2018-03-15 (×2): 25 mg via ORAL
  Filled 2018-03-15 (×2): qty 1

## 2018-03-15 MED ORDER — WHITE PETROLATUM EX OINT
TOPICAL_OINTMENT | CUTANEOUS | Status: AC
  Administered 2018-03-15: 1
  Filled 2018-03-15: qty 28.35

## 2018-03-15 NOTE — Progress Notes (Signed)
Yvonne Patel Progress Note    Assessment/ Plan:   73 y/o HTN PAD  DM p/w right sided weakness and aphasia found to have a left cerebellar and MCA infarct s/p tPA on vent.  1.Chronic kidney disease stage IV with a baseline serum creatinine around 1.5 to 1.8 mg/dL. I suspect this is on the basis of longstanding hypertension and diabetes.  2. Acute kidney injury. Likely on the basis of hemodynamics in the setting of an acute stroke, hypotension, and self extubation. Attempt to avoid hypotension. Check urinalysis for completeness. Remains oliguric.  - May not have significant renal recovery and had been supporting with CRRT. - CRRT off 11am 1/7.  Plan is for extrubation Fri when the brother is here. No acute indications for dialysis and I agree that she is optimized as well as she can be.  Will sign off at this time; please reconsult as needed.    3. Hypertension.Continue oral medications.  4. Acute left MCA CVA.Marland Kitchen No clear evidence of neurologic improvementbut is awake and interactive with family. Palliative care discussions ongoing. Status post TPA and thrombectomy.   5. Systolic congestive heart failure. Continue goal-directed therapy for now, but keep ACE inhibitor on hold in the setting of acute kidney injury.  6. Mixed gap and non-anion gap metabolic acidosis. Likely combination of acute kidney injury. Significant worsening of acidosis and hyperglycemia suggest a possible component of DKA. Would addinsulin therapy? DKA - d/w PCM PA.  Subjective:   Intubated on vent.   Objective:   BP 114/78   Pulse 73   Temp 99.7 F (37.6 C) (Axillary)   Resp 18   Ht '5\' 5"'  (1.651 m)   Wt 73.5 kg   SpO2 100%   BMI 26.96 kg/m   Intake/Output Summary (Last 24 hours) at 03/15/2018 1031 Last data filed at 03/15/2018 0700 Gross per 24 hour  Intake 1299.35 ml  Output 584 ml  Net 715.35 ml   Weight change: -9.1 kg  Physical Exam: General:  Coughing CV: Heart RRR  Lungs:Clear Abd: abd SNT/ND with normal BS Extremities:+1 bilateral lower extremity edema Skin: No skin rash  Imaging: Dg Chest Port 1 View  Result Date: 03/13/2018 CLINICAL DATA:  Central line placement EXAM: PORTABLE CHEST 1 VIEW COMPARISON:  Chest radiograph from earlier today. FINDINGS: Endotracheal tube tip is 1.8 cm above the carina. Enteric tube enters stomach with the tip not seen on this image. Right internal jugular central venous catheter terminates in the middle third of the SVC. Stable cardiomediastinal silhouette with mild cardiomegaly. No pneumothorax. No pleural effusion. Hazy parahilar lung opacities are stable. IMPRESSION: 1. Right internal jugular central venous catheter terminates in the middle third of the SVC. No pneumothorax. 2. Endotracheal tube tip 1.8 cm above the carina. 3. Stable mild cardiomegaly. Stable hazy parahilar lung opacities, favor pulmonary edema. Electronically Signed   By: Ilona Sorrel M.D.   On: 03/13/2018 15:14    Labs: BMET Recent Labs  Lab 03/11/18 0535 03/11/18 1245 03/11/18 1700 03/12/18 0453 03/12/18 1615  03/13/18 1531  03/14/18 0509 03/14/18 1000 03/14/18 1405 03/14/18 1800 03/14/18 2149 03/15/18 0130 03/15/18 0527  NA 138  --   --  138  --    < > 138   < > 138 136 136 137 137 137 135  K 4.7  --   --  4.8  --    < > 4.9   < > 4.3 4.4 5.2* 5.4* 4.4 4.6 4.7  CL 110  --   --  111  --    < > 109   < > 103 104 103 104 102 103 101  CO2 13*  --   --  15*  --    < > 14*   < > 21* 21* 21* '22 22 22 ' 21*  GLUCOSE 188*  --   --  272*  --    < > 370*   < > 181* 244* 218* 86 102* 154* 185*  BUN 56*  --   --  83*  --    < > 119*   < > 58* 48* 53* 58* 66* 69* 73*  CREATININE 4.68*  --   --  5.26*  --    < > 6.09*   < > 2.91* 2.50* 2.53* 2.87* 3.14* 3.35* 3.65*  CALCIUM 7.6*  --   --  7.0*  --    < > 6.4*   < > 7.3* 7.4* 7.5* 7.8* 7.8* 8.0* 7.9*  PHOS 9.1* 8.2* 8.1* 7.5* 8.2*  --  7.8*  --   --   --   --   --   --   --   5.3*   < > = values in this interval not displayed.   CBC Recent Labs  Lab 03/28/2018 1656  03/11/18 0535 03/12/18 0453 03/13/18 0513 03/14/18 0509  WBC 6.3   < > 12.1* 13.1* 13.0* 11.3*  NEUTROABS 3.2  --   --   --   --   --   HGB 12.2   < > 9.3* 8.9* 8.4* 8.6*  HCT 38.5   < > 28.9* 27.3* 26.3* 26.5*  MCV 94.1   < > 95.4 95.5 95.6 93.0  PLT 174   < > 116* 106* 100* 110*   < > = values in this interval not displayed.    Medications:    .  stroke: mapping our early stages of recovery book   Does not apply Once  . aspirin  81 mg Per Tube Daily  . chlorhexidine gluconate (MEDLINE KIT)  15 mL Mouth Rinse BID  . clopidogrel  75 mg Oral QHS  . hydrALAZINE  50 mg Oral Q6H  . insulin aspart  0-15 Units Subcutaneous Q4H  . insulin glargine  12 Units Subcutaneous QHS  . mouth rinse  15 mL Mouth Rinse 10 times per day  . pantoprazole sodium  40 mg Per Tube Q1200      Otelia Santee, MD 03/15/2018, 10:31 AM

## 2018-03-15 NOTE — Progress Notes (Signed)
NAME:  Yvonne Patel, MRN:  [id number], DOB:  1945/08/28, LOS: 7 ADMISSION DATE:  03/14/2018, CONSULTATION DATE:  1/1 REFERRING MD:  Dr. Rory Percy , CHIEF COMPLAINT:  CVA   Brief History   74 year old female admitted with acute L MCA CVA s/p systemic TPA and mechanical thrombolysis.   Past Medical History  DM II TIA  Jehovah's witness > no blood products  HTN HLD CAD Cardiomyopathy  Significant Hospital Events   1/01 admit 1/03 CP, + troponin, Cardiology consulted.  Cleviprex, propofol.   1/04 Self extubated, reintubated for poor cough/airway protection 1/6: No improvement neurological exam, renal function worse, having bradycardic events. 1/7: CRRT initiated on 1/6.  Metabolic status regulated, acid-base improve, creatinine improved, glycemic control improved, lab data did suggest mild DKA anion gap now closed.  Family meeting completed.  Goals of care discussed.  DO NOT RESUSCITATE confirmed.  Plan would be to extubate 1/10 when eldest son arrives, no reintubation.  CRRT discontinued later that afternoon after dialysis machine clotted 1/8: No significant change.  Consults:  Cardiology  Procedures:  VIR 1/1 >> procedure completed for left MCA occlusion at M1 segment. The procedure involved two passes of combination mechanical and aspiration thrombectomy with restoration of TICI 2b flow. L femoral art line 1/1 >> ETT 1/1 >>   Significant Diagnostic Tests:  CT head 1/1 > no acute finding, multiple remote small vessel infarcts CTA head/neck 1/1 > Left M2 branch occlusion beginning at the M1 bifurcation. Severe atherosclerosis which preferentially spares the left carotid circulation, question prior endarterectomy. Severe atheromatous narrowing if not occlusion of bilateral proximal subclavian arteries. There is bilateral fetal type PCA with diffuse thready flow in the posterior fossa. 70% atheromatous narrowing of the right common carotid and ICA bulb. CT head 1/1 (post IR) > New 3 mm  high-density within the posterior right frontal cortex which could be focus of hemorrhage or an enhancing subacute infarct. No hemorrhage or infarct seen along the postoperative left MCA distribution. TTE 1/1 >> LVEF 30-35%, mod global & severe inferior / inferolateral hypokinesis,  MRI Brain 1/2 >> moderate left MCA infarct, punctate acute infarcts involving the left caudate, right centrum semiovale, parasagittal right parietal cortex, bilateral cerebellar hemispheres.  4 mm hemorrhage right precentral gyrus.  Numerous microhemorrhages present basal ganglia both cerebral hemispheres, cerebellum. Advanced intracranial atherosclerosis, moderate ICA stenoses, chronic segmental occlusion basilar artery with decreased bilateral vertebral artery flow  Micro Data:  1/1 MRSA PCR  >> neg  Antimicrobials:  1/1 ancef preop  Interim history/subjective:  CRRT discontinued yesterday after filter clotted off Objective   Blood pressure 114/78, pulse 73, temperature 99.7 F (37.6 C), temperature source Axillary, resp. rate 18, height 5\' 5"  (1.651 m), weight 73.5 kg, SpO2 100 %.    Vent Mode: CPAP;PSV FiO2 (%):  [40 %] 40 % Set Rate:  [16 bmp] 16 bmp Vt Set:  [450 mL] 450 mL PEEP:  [5 cmH20] 5 cmH20 Pressure Support:  [8 cmH20] 8 cmH20 Plateau Pressure:  [13 cmH20-18 cmH20] 13 cmH20   Intake/Output Summary (Last 24 hours) at 03/15/2018 0906 Last data filed at 03/15/2018 0700 Gross per 24 hour  Intake 1359.19 ml  Output 777 ml  Net 582.19 ml   Filed Weights   03/13/18 0500 03/14/18 0500 03/15/18 0500  Weight: 83.3 kg 82.6 kg 73.5 kg    Examination: General: 73 year old female resting in bed intermittent only agitated, still attempts to interact with family however does not follow commands HEENT normocephalic atraumatic  no jugular venous distention Pulmonary: Clear to auscultation excellent tidal volume on pressure support of 8 no accessory use with this Cardiac: No murmur rub or gallop occasional  PAC noted on telemetry Abdomen: Soft not tender no organomegaly Extremities: Dependent edema, warm, pulses dopplered. Neuro: Awake, attempts to interact intermittently.  Will not follow commands.  Right side hemiparesis persists. GU: Concentrated yellow urine, does appear to be picking up mildly  Resolved Hospital Problem list   Intermittent bradycardia, likely exacerbated by acidosis and beta-blockade resolved 1/7 Progressive anion gap metabolic acidosis in setting of mild DKA resolved/1/7 Assessment & Plan:   Acute CVA:  Left M1/M2 distribution. S/p systemic TPA 1/1 at 1700, and Neuro IR thrombectomy.  Associated hemorrhage. P: Continue supportive care  Acute respiratory insufficency related to above  -failed self extubation 1/5 Portable chest x-ray personally reviewed: Bibasilar airspace disease consistent with atelectasis may be element of edema as well support tubes and lines in satisfactory position -Appears comfortable on pressure support ventilation P: Continue pressure support as tolerated, okay to use this nocturnally if tolerating  VAP bundle  Planning for extubation most likely 1/10 when eldest son arrives this will be a one-way extubation    Hypertension PAD -difficult to monitor BP due to severe subclavian stenosis P: Hydralazine for blood pressure less than 180 Need to try to switch arterial line to radial, in anticipation of extubation in next 48 hours  Chest pain NSTEMI - troponin  peaked Systolic HF - TTE 1/1 with EF 30-35% P: Continue telemetry monitoring Scheduled hydralazine and PRN hydralazine Holding beta-blockade due to episodes of bradycardia   Acute on chronic oliguric renal failure, suspect some component of hypoperfusion, dye load -Remains oliguric, -Seen by nephrology, baseline creatinine 1.5-1.8.  Started on CRRT on 1/6, creatinine improved, metabolic status improved.  Catheter clotted on 1/7 creatinine has increased a little since stopping CRRT  however urine output has increased marginally  p: Renal dose medications Strict intake output Further recommendations per nephrology  Abnormal LFTs -Question shock liver versus cholestasis P: Repeat LFTs a.m., if remain elevated get abdominal ultrasound  Intermittent  fluid and electrolyte imbalance P: Continue to monitor blood chemistries  DM2, mild DKA resolved as of 1/7  p:  Sliding scale insulin with Lantus, increasing basal dosing  Best practice:  Diet: NPO Pain/Anxiety/Delirium protocol (if indicated): propofol RASS goal 0 to -1 VAP protocol (if indicated): per protocol DVT prophylaxis: SCD GI prophylaxis: protonix Glucose control: ssi Mobility: BR Code Status: Full Family Communication:  Goals of care today.  Family leaning towards DO NOT RESUSCITATE.  Anticipating one-way extubation on 1/10  Critical care time:  33 minutes.      Erick Colace ACNP-BC [place] Pager # [phone] OR # [phone] if no answer

## 2018-03-15 NOTE — Progress Notes (Signed)
[facility] TEAM PROGRESS NOTE   SUBJECTIVE (INTERVAL HISTORY) Patient's  renal function is  Again worsening with creatinine 3.3   She is neurologically awake but remains aphasic not following commands consistently. OBJECTIVE Temp:  [97.3 F (36.3 C)-99.7 F (37.6 C)] 98.8 F (37.1 C) (01/08 1200) Pulse Rate:  [65-80] 73 (01/08 1143) Cardiac Rhythm: Normal sinus rhythm (01/08 0400) Resp:  [16-34] 24 (01/08 1143) BP: (88-204)/(41-78) 88/59 (01/08 1143) SpO2:  [100 %] 100 % (01/08 1143) Arterial Line BP: (98-199)/(40-79) 167/46 (01/08 1100) FiO2 (%):  [40 %] 40 % (01/08 1143) Weight:  [73.5 kg] 73.5 kg (01/08 0500)  Recent Labs  Lab 03/14/18 2153 03/14/18 2246 03/15/18 0340 03/15/18 0749 03/15/18 1145  GLUCAP 100* 95 162* 180* 255*   Recent Labs  Lab 03/11/18 1700 03/12/18 0453 03/12/18 1615  03/13/18 1531  03/14/18 0509  03/14/18 1800 03/14/18 2149 03/15/18 0130 03/15/18 0527 03/15/18 1000  NA  --  138  --    < > 138   < > 138   < > 137 137 137 135 134*  K  --  4.8  --    < > 4.9   < > 4.3   < > 5.4* 4.4 4.6 4.7 4.8  CL  --  111  --    < > 109   < > 103   < > 104 102 103 101 102  CO2  --  15*  --    < > 14*   < > 21*   < > 22 22 22  21* 20*  GLUCOSE  --  272*  --    < > 370*   < > 181*   < > 86 102* 154* 185* 256*  BUN  --  83*  --    < > 119*   < > 58*   < > 58* 66* 69* 73* 83*  CREATININE  --  5.26*  --    < > 6.09*   < > 2.91*   < > 2.87* 3.14* 3.35* 3.65* 3.83*  CALCIUM  --  7.0*  --    < > 6.4*   < > 7.3*   < > 7.8* 7.8* 8.0* 7.9* 7.8*  MG 2.2 2.3 2.4  --   --   --  2.4  --   --   --   --  2.5*  --   PHOS 8.1* 7.5* 8.2*  --  7.8*  --   --   --   --   --   --  5.3*  --    < > = values in this interval not displayed.   Recent Labs  Lab 03/12/2018 1656 03/09/18 1055 03/10/18 0503 03/14/18 0509 03/15/18 0527  AST 27  --   --  960*  --   ALT 26  --   --  1,072*  --   ALKPHOS 97  --   --  679*  --   BILITOT 0.7  --   --  1.1  --   PROT 7.4  --   --  5.7*  --    ALBUMIN 3.0* 2.1* 2.1* 1.9* 1.7*   Recent Labs  Lab 04/06/2018 1656  03/10/18 0503 03/11/18 0535 03/12/18 0453 03/13/18 0513 03/14/18 0509  WBC 6.3   < > 8.4 12.1* 13.1* 13.0* 11.3*  NEUTROABS 3.2  --   --   --   --   --   --   HGB 12.2   < >  9.0* 9.3* 8.9* 8.4* 8.6*  HCT 38.5   < > 28.8* 28.9* 27.3* 26.3* 26.5*  MCV 94.1   < > 96.0 95.4 95.5 95.6 93.0  PLT 174   < > 145* 116* 106* 100* 110*   < > = values in this interval not displayed.   Recent Labs  Lab 03/09/18 0019 03/09/18 0432 03/09/18 1040 03/09/18 1526 03/09/18 2224  TROPONINI 6.47* 5.08* 4.70* 3.81* 3.88*   No results for input(s): LABPROT, INR in the last 72 hours. No results for input(s): COLORURINE, LABSPEC, [place], GLUCOSEU, HGBUR, BILIRUBINUR, KETONESUR, PROTEINUR, UROBILINOGEN, NITRITE, LEUKOCYTESUR in the last 72 hours.  Invalid input(s): APPERANCEUR     Component Value Date/Time   CHOL 141 03/09/2018 0432   TRIG 120 03/09/2018 0432   HDL 25 (L) 03/09/2018 0432   CHOLHDL 5.6 03/09/2018 0432   VLDL 24 03/09/2018 0432   LDLCALC 92 03/09/2018 0432   Lab Results  Component Value Date   HGBA1C 7.1 (H) 03/09/2018   No results found for: LABOPIA, COCAINSCRNUR, LABBENZ, AMPHETMU, THCU, LABBARB  No results for input(s): ETH in the last 168 hours.  I have personally reviewed the radiological images below and agree with the radiology interpretations.  Ct Angio Head W Or Wo Contrast  Result Date: 03/28/2018 CLINICAL DATA:  Code stroke, deficit not specified EXAM: CT ANGIOGRAPHY HEAD AND NECK TECHNIQUE: Multidetector CT imaging of the head and neck was performed using the standard protocol during bolus administration of intravenous contrast. Multiplanar CT image reconstructions and MIPs were obtained to evaluate the vascular anatomy. Carotid stenosis measurements (when applicable) are obtained utilizing NASCET criteria, using the distal internal carotid diameter as the denominator. CONTRAST:  49mL ISOVUE-370  IOPAMIDOL (ISOVUE-370) INJECTION 76% COMPARISON:  None. FINDINGS: CTA NECK FINDINGS Aortic arch: Extensive atherosclerotic calcification. Three vessel branching Right carotid system: Diffuse mainly calcified atherosclerotic plaque which limits visualization of the lumen due to plaque blooming. There is distal common carotid stenosis that measures up to 70% (when compared to the more proximal vessel given the immediate downstream bifurcation). There is ICA bulb plaque with 70% stenosis. Left carotid system: Much less extensive atherosclerotic plaque, question prior carotid endarterectomy. No stenosis or ulceration. Vertebral arteries: Severe bilateral proximal subclavian stenosis, with possible occlusion on the right. Narrowing on the left involves and extensive segment. No flow is seen within vertebral arteries until the V3 segments, there may be retrograde flow Skeleton: Diffuse degenerative disease.  No acute finding Other neck: Bilateral cataract resection. Upper chest: Hazy density of the lungs which could be from atelectasis. No Kerley lines to implicate edema. Review of the MIP images confirms the above findings CTA HEAD FINDINGS Anterior circulation: Confluent atherosclerotic calcification on the carotid siphons with small vessel size and plaque blooming limiting luminal visualization. There is a left M2 branch occlusion beginning at the M1 bifurcation with subsequent downstream reconstitution. No contralateral embolism is seen. Posterior circulation: Tiny vertebrobasilar arteries. There may be a basilar interruption from atherosclerosis or developmental variant. Fetal type flow to both posterior cerebral arteries which are symmetrically patent Venous sinuses: Patent as permitted by contrast timing Anatomic variants: As above Delayed phase: Not obtained in the emergent setting Review of the MIP images confirms the above findings Case discussed with Dr. Rory Percy at 5:04 p.m. IMPRESSION: 1. Left M2 branch  occlusion beginning at the M1 bifurcation. 2. Severe atherosclerosis which preferentially spares the left carotid circulation, question prior endarterectomy. 3. Severe atheromatous narrowing if not occlusion of bilateral proximal subclavian arteries. There is  bilateral fetal type PCA with diffuse thready flow in the posterior fossa. 4. 70% atheromatous narrowing of the right common carotid and ICA bulb. Electronically Signed   By: Monte Fantasia M.D.   On: 03/26/2018 17:16   Dg Abd 1 View  Result Date: 03/12/2018 CLINICAL DATA:  Orogastric tube placement.  Stroke. EXAM: ABDOMEN - 1 VIEW COMPARISON:  None. FINDINGS: Gastric decompression catheter extends into the stomach with the tip likely in the distal stomach near the antrum. No evidence of overt bowel obstruction or ileus. IMPRESSION: Orogastric tube extends into the stomach with the tip located in the distal stomach, likely near the antrum. Electronically Signed   By: Aletta Edouard M.D.   On: 03/12/2018 08:43   Ct Head Wo Contrast  Result Date: 03/11/2018 CLINICAL DATA:  Follow-up examination for acute stroke. EXAM: CT HEAD WITHOUT CONTRAST TECHNIQUE: Contiguous axial images were obtained from the base of the skull through the vertex without intravenous contrast. COMPARISON:  Comparison made with prior MRI from 03/09/2018 as well as CT from 03/10/2018. FINDINGS: Brain: There has been continued interval evolution of small to moderate sized left MCA territory infarct, overall stable in distribution as compared to previous MRI. Mild localized edema without significant mass effect. Additional involving subcentimeter left cerebellar infarct noted as well. No evidence for hemorrhagic transformation or other complication. Previously noted additional punctate subcentimeter infarcts not well seen by CT. Subcentimeter hyperdensity involving the right frontal cortex slightly increased in size on today's exam measuring 5 mm, consistent with previously identified  small hemorrhage. No significant mass effect or edema. No other evidence for new or interval infarction. No other acute large vessel territory infarct. Underlying atrophy with chronic microvascular ischemic disease noted, stable. Vascular: No hyperdense vessel. Calcified atherosclerosis noted at the skull base. Skull: Scalp soft tissues and calvarium within normal limits. Sinuses/Orbits: Globes and orbital soft tissues demonstrate no acute finding. Paranasal sinuses and mastoid air cells remain clear. Other: None. IMPRESSION: 1. Normal expected interval evolution of small to moderate sized left MCA territory infarcts. No evidence for hemorrhagic transformation or other complication. Additional previously identified subcentimeter acute ischemic infarcts not well visualized by CT. 2. Slight interval increase in size of cortically based right frontal parenchymal hemorrhage, now measuring 5 mm (previously 3 mm). 3. No other new acute intracranial abnormality. Electronically Signed   By: Jeannine Boga M.D.   On: 03/11/2018 03:41   Ct Head Wo Contrast  Result Date: 03/26/2018 CLINICAL DATA:  Post thrombectomy EXAM: CT HEAD WITHOUT CONTRAST TECHNIQUE: Contiguous axial images were obtained from the base of the skull through the vertex without intravenous contrast. COMPARISON:  Head CT from earlier today FINDINGS: Brain: 3 mm focus of high density along the high and posterior right frontal convexity, on reformats this appears to be intraparenchymal. No hemorrhagic conversion or visible infarct in the area of procedure. Multiple remote small vessel infarcts. Vascular: Major vessels are symmetrically enhancing. Skull: Negative Sinuses/Orbits: Negative These results were called by telephone at the time of interpretation on 03/14/2018 at 8:17 pm to Dr. Cheral Marker , who verbally acknowledged these results. IMPRESSION: New 3 mm high-density within the posterior right frontal cortex which could be focus of hemorrhage or an  enhancing subacute infarct. No hemorrhage or infarct seen along the postoperative left MCA distribution. Electronically Signed   By: Monte Fantasia M.D.   On: 03/31/2018 20:18   Ct Angio Neck W Or Wo Contrast  Result Date: 04/05/2018 CLINICAL DATA:  Code stroke, deficit not specified EXAM:  CT ANGIOGRAPHY HEAD AND NECK TECHNIQUE: Multidetector CT imaging of the head and neck was performed using the standard protocol during bolus administration of intravenous contrast. Multiplanar CT image reconstructions and MIPs were obtained to evaluate the vascular anatomy. Carotid stenosis measurements (when applicable) are obtained utilizing NASCET criteria, using the distal internal carotid diameter as the denominator. CONTRAST:  68mL ISOVUE-370 IOPAMIDOL (ISOVUE-370) INJECTION 76% COMPARISON:  None. FINDINGS: CTA NECK FINDINGS Aortic arch: Extensive atherosclerotic calcification. Three vessel branching Right carotid system: Diffuse mainly calcified atherosclerotic plaque which limits visualization of the lumen due to plaque blooming. There is distal common carotid stenosis that measures up to 70% (when compared to the more proximal vessel given the immediate downstream bifurcation). There is ICA bulb plaque with 70% stenosis. Left carotid system: Much less extensive atherosclerotic plaque, question prior carotid endarterectomy. No stenosis or ulceration. Vertebral arteries: Severe bilateral proximal subclavian stenosis, with possible occlusion on the right. Narrowing on the left involves and extensive segment. No flow is seen within vertebral arteries until the V3 segments, there may be retrograde flow Skeleton: Diffuse degenerative disease.  No acute finding Other neck: Bilateral cataract resection. Upper chest: Hazy density of the lungs which could be from atelectasis. No Kerley lines to implicate edema. Review of the MIP images confirms the above findings CTA HEAD FINDINGS Anterior circulation: Confluent atherosclerotic  calcification on the carotid siphons with small vessel size and plaque blooming limiting luminal visualization. There is a left M2 branch occlusion beginning at the M1 bifurcation with subsequent downstream reconstitution. No contralateral embolism is seen. Posterior circulation: Tiny vertebrobasilar arteries. There may be a basilar interruption from atherosclerosis or developmental variant. Fetal type flow to both posterior cerebral arteries which are symmetrically patent Venous sinuses: Patent as permitted by contrast timing Anatomic variants: As above Delayed phase: Not obtained in the emergent setting Review of the MIP images confirms the above findings Case discussed with Dr. Rory Percy at 5:04 p.m. IMPRESSION: 1. Left M2 branch occlusion beginning at the M1 bifurcation. 2. Severe atherosclerosis which preferentially spares the left carotid circulation, question prior endarterectomy. 3. Severe atheromatous narrowing if not occlusion of bilateral proximal subclavian arteries. There is bilateral fetal type PCA with diffuse thready flow in the posterior fossa. 4. 70% atheromatous narrowing of the right common carotid and ICA bulb. Electronically Signed   By: Monte Fantasia M.D.   On: 04/02/2018 17:16   Mr Jodene Nam Head Wo Contrast  Result Date: 03/09/2018 CLINICAL DATA:  Stroke follow-up. Status post endovascular revascularization of left MCA occlusion yesterday. EXAM: MRI HEAD WITHOUT CONTRAST MRA HEAD WITHOUT CONTRAST TECHNIQUE: Multiplanar, multiecho pulse sequences of the brain and surrounding structures were obtained without intravenous contrast. Angiographic images of the head were obtained using MRA technique without contrast. COMPARISON:  Head CT and CTA 03/16/2018. Brain MRI 07/20/2016. Head MRA 09/30/2011. FINDINGS: MRI HEAD FINDINGS Brain: There is a small to slightly moderate-sized acute left MCA infarct involving the insula, predominantly posterior frontal lobe as well as operculum, and small amount of the  postcentral gyrus. There also punctate acute infarcts involving the left caudate nucleus, right centrum semiovale, parasagittal right parietal cortex, and likely left occipital white matter. There are also small acute infarcts in the left greater than right cerebellar hemispheres. Susceptibility artifact in the right precentral gyrus corresponds to the 4 mm acute hemorrhage on yesterday's CT with minimal surrounding edema. There are numerous chronic microhemorrhages clustered about the right insula and operculum which are new from the 2018 MRI. Multiple additional chronic microhemorrhages are  present in the basal ganglia bilaterally and scattered throughout both cerebral hemispheres and cerebellum. Patchy T2 hyperintensities in the cerebral white matter and pons have progressed from the prior MRI and are nonspecific but compatible with moderate chronic small-vessel ischemic disease. There are new chronic lacunar infarcts in the cerebral white matter, and there are chronic lacunar infarcts in the basal ganglia bilaterally. Small chronic infarcts in the cerebellar hemispheres have increased in number from the prior MRI. Mild cerebral atrophy is within normal limits for age. No mass, midline shift, or extra-axial fluid collection is identified. Vascular: Diminutive vertebrobasilar circulation, more fully evaluated below. Skull and upper cervical spine: Unremarkable bone marrow signal. Sinuses/Orbits: Bilateral cataract extraction. Paranasal sinuses and mastoid air cells are clear. Other: Partially visualized endotracheal and enteric tubes. MRA HEAD FINDINGS The distal vertebral arteries are diminutive with diminished flow most notable in the distal right V3 and V4 segments and with bilateral proximal vertebral artery occlusion shown on yesterday's neck CTA. Patent bilateral PICA, right AICA, and bilateral SCA origins are identified. There is a chronic lack of visualization of the mid basilar artery which appears patent  more proximally and distally, and this may reflect segmental atherosclerotic occlusion or a developmental variant. There are large posterior communicating arteries bilaterally with absence or severe hypoplasia of the right P1 segment. Both PCAs are patent without evidence of significant proximal stenosis. The internal carotid arteries are patent from skull base to carotid termini with extensive bilateral cavernous segment atherosclerotic irregularity resulting in mild stenosis on the right and moderate stenosis on the left. 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 ACAs and MCAs are patent with branch vessel irregularity but no evidence of proximal branch occlusion. The left MCA trifurcation remains patent following thrombectomy although there is moderate stenosis of the inferior and mid M2 origins. There also mild proximal left M1 and left A1 stenoses. IMPRESSION: 1. Small to moderate-sized acute left MCA infarct. 2. Additional predominantly subcentimeter acute infarcts in the cerebral and cerebellar hemispheres bilaterally. 3. 4 mm acute hemorrhage in the right precentral gyrus as seen on CT yesterday. 4. Progressive chronic small vessel ischemia since 2018 with increased number of chronic microhemorrhages and chronic lacunar infarcts. 5. Advanced intracranial atherosclerosis. Revascularized left MCA bifurcation remains patent with moderate M2 origin stenoses. 6. Moderate ICA stenoses. 7. Diminished flow in the distal vertebral arteries with bilateral proximal occlusion shown on prior CTA. 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 8. 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 Electronically Signed   By: Logan Bores M.D.   On: 03/09/2018 17:17   Mr Brain Wo  Contrast  Result Date: 03/09/2018 CLINICAL DATA:  Stroke follow-up. Status post endovascular revascularization of left MCA occlusion yesterday. EXAM: MRI HEAD WITHOUT CONTRAST MRA HEAD WITHOUT CONTRAST TECHNIQUE: Multiplanar, multiecho pulse sequences of the brain and surrounding structures were obtained without intravenous contrast. Angiographic images of the head were obtained using MRA technique without contrast. COMPARISON:  Head CT and CTA 04/06/2018. Brain MRI 07/20/2016. Head MRA 09/30/2011. FINDINGS: MRI HEAD FINDINGS Brain: There is a small to slightly moderate-sized acute left MCA infarct involving the insula, predominantly posterior frontal lobe as well as operculum, and small amount of the postcentral gyrus. There also punctate acute infarcts involving the left caudate nucleus, right centrum  semiovale, parasagittal right parietal cortex, and likely left occipital white matter. There are also small acute infarcts in the left greater than right cerebellar hemispheres. Susceptibility artifact in the right precentral gyrus corresponds to the 4 mm acute hemorrhage on yesterday's CT with minimal surrounding edema. There are numerous chronic microhemorrhages clustered about the right insula and operculum which are new from the 2018 MRI. Multiple additional chronic microhemorrhages are present in the basal ganglia bilaterally and scattered throughout both cerebral hemispheres and cerebellum. Patchy T2 hyperintensities in the cerebral white matter and pons have progressed from the prior MRI and are nonspecific but compatible with moderate chronic small-vessel ischemic disease. There are new chronic lacunar infarcts in the cerebral white matter, and there are chronic lacunar infarcts in the basal ganglia bilaterally. Small chronic infarcts in the cerebellar hemispheres have increased in number from the prior MRI. Mild cerebral atrophy is within normal limits for age. No mass, midline shift, or extra-axial fluid  collection is identified. Vascular: Diminutive vertebrobasilar circulation, more fully evaluated below. Skull and upper cervical spine: Unremarkable bone marrow signal. Sinuses/Orbits: Bilateral cataract extraction. Paranasal sinuses and mastoid air cells are clear. Other: Partially visualized endotracheal and enteric tubes. MRA HEAD FINDINGS The distal vertebral arteries are diminutive with diminished flow most notable in the distal right V3 and V4 segments and with bilateral proximal vertebral artery occlusion shown on yesterday's neck CTA. Patent bilateral PICA, right AICA, and bilateral SCA origins are identified. There is a chronic lack of visualization of the mid basilar artery which appears patent more proximally and distally, and this may reflect segmental atherosclerotic occlusion or a developmental variant. There are large posterior communicating arteries bilaterally with absence or severe hypoplasia of the right P1 segment. Both PCAs are patent without evidence of significant proximal stenosis. The internal carotid arteries are patent from skull base to carotid termini with extensive bilateral cavernous segment atherosclerotic irregularity resulting in mild stenosis on the right and moderate stenosis on the left. 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 ACAs and MCAs are patent with branch vessel irregularity but no evidence of proximal branch occlusion. The left MCA trifurcation remains patent following thrombectomy although there is moderate stenosis of the inferior and mid M2 origins. There also mild proximal left M1 and left A1 stenoses. IMPRESSION: 1. Small to moderate-sized acute left MCA infarct. 2. Additional predominantly subcentimeter acute infarcts in the cerebral and cerebellar hemispheres bilaterally. 3. 4 mm acute hemorrhage in the right precentral gyrus as  seen on CT yesterday. 4. Progressive chronic small vessel ischemia since 2018 with increased number of chronic microhemorrhages and chronic lacunar infarcts. 5. Advanced intracranial atherosclerosis. Revascularized left MCA bifurcation remains patent with moderate M2 origin stenoses. 6. Moderate ICA stenoses. 7. Diminished flow in the distal vertebral arteries with bilateral proximal occlusion shown on prior CTA. 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 8. 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 Electronically Signed   By: Logan Bores M.D.   On: 03/09/2018 17:17   Ir Angiogram Extremity Bilateral  Result Date: 03/19/2018 INDICATION: 73 year old female with a history of acute left MCA stroke. She presents within time frame for IV tPA, and is a candidate for mechanical thrombectomy EXAM: ULTRASOUND GUIDED ACCESS RIGHT COMMON FEMORAL ARTERY ULTRASOUND GUIDED ACCESS LEFT COMMON FEMORAL  ARTERY FOR ARTERIAL MONITORING ANGIOGRAM RIGHT LOWER EXTREMITY BALLOON ANGIOPLASTY STENOSIS RIGHT COMMON ILIAC ARTERY IN ORDER TO PASS 8 FRENCH SHEATH TO COMPLETE THE THROMBECTOMY CEREBRAL ANGIOGRAM MECHANICAL THROMBECTOMY LEFT MCA EMERGENT LARGE VESSEL OCCLUSION DEPLOYMENT OF ANGIO-SEAL RIGHT COMMON FEMORAL ARTERY COMPARISON:  CT imaging of the same day MEDICATIONS: 2 g Ancef IV. The antibiotic was administered within 1 hour of the procedure ANESTHESIA/SEDATION: General endotracheal tube anesthesia CONTRAST:  96 cc Isovue-300 FLUOROSCOPY TIME:  Fluoroscopy Time: 26 minutes 24 seconds (1,362 mGy). COMPLICATIONS: None TECHNIQUE: Informed written consent was obtained from the patient's family after a thorough discussion of the procedural risks, benefits and alternatives. Specific risks discussed include: Bleeding, infection, contrast reaction, kidney injury/failure, need for further procedure/surgery, arterial  injury or dissection, embolization to new territory, intracranial hemorrhage (10-15% risk), neurologic deterioration, cardiopulmonary collapse, death. All questions were addressed. Maximal Sterile Barrier Technique was utilized including during the procedure including caps, mask, sterile gowns, sterile gloves, sterile drape, hand hygiene and skin antiseptic. A timeout was performed prior to the initiation of the procedure. The anesthesia team was present to provide general endotracheal tube anesthesia and for patient monitoring during the procedure. Interventional neuro radiology nursing staff was also present. 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 Surgical changes of prior endarterectomy evident on prior CT imaging. Left external carotid artery: Patent with antegrade flow. Left internal carotid artery: Normal course caliber and contour of the cervical portion. Vertical and petrous segment patent with normal course caliber contour. Cavernous segment patent. Clinoid segment patent. Antegrade flow of the ophthalmic artery. Ophthalmic segment patent. Terminus patent. Left MCA: Distal M1 occluded at the initiation of the case. There is partial filling of an inferior branch which is the non dominant branch to the frontal. Patent fetal PCA to the left P2 segment. Left ACA: A 1 segment patent. A 2 segment perfuses the right territory. Completion Findings: Left MCA: After 2 passes of mechanical thrombectomy plus aspiration, there is near complete restoration of flow through the left MCA territory, with slow flow through an M3/M4 branch of the parietal region compatible with TI CI 2b flow. 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 Physical exam At the initiation of the case, palpable bilateral common femoral artery pulses present No palpable distal pulses  Doppler positive pulses of the right posterior tibial and anterior tibial (with systolic blood pressures above 200) Doppler positive pulse at the left posterior tibial (with systolic blood pressure above 200), with no Doppler signal at the left anterior tibial At completion of case Doppler positive pulses were only discovered on the right posterior tibial and anterior tibial arteries with blood pressures above 200. In the 140-160 range (goal) Doppler signal was not evident A completion of case Doppler positive pulses were only discovered on the left at the posterior tibial and not the left anterior tibial (with the pressure above 354 systolic). PROCEDURE: Patient is brought emergently to the neuro angiography suite, with the patient identified appropriately and placed supine position on the table. Left radial arterial line was attempted by the anesthesia team. The patient is then prepped and draped in the usual sterile fashion. Ultrasound survey of the right inguinal region was performed with images stored and sent to PACs. 11 blade scalpel was used to make a small incision. Blunt dissection was performed. A micropuncture needle was used access the right common femoral artery under ultrasound. With excellent  arterial blood flow returned, an .018 micro wire was passed through the needle, observed to enter the abdominal aorta under fluoroscopy. The needle was removed, and a micropuncture sheath was placed over the wire. The inner dilator and wire were removed, and an 035 Bentson wire was advanced under fluoroscopy into the abdominal aorta. The sheath was removed and a standard 5 [place] vascular sheath was placed. The dilator was removed and the sheath was flushed. Ultrasound survey of the left inguinal region was then performed with images stored and sent to PACs, confirming patency of the vessel. A micropuncture needle was used access the left common femoral artery under ultrasound. With excellent arterial blood flow  returned, and an .018 micro wire was passed through the needle, observed enter the abdominal aorta under fluoroscopy. The needle was removed, and a micropuncture sheath was placed over the wire. The inner dilator and wire were removed, and an 035 Bentson wire was advanced under fluoroscopy into the abdominal aorta. The sheath was removed and a standard 4 [place] vascular sheath was placed for arterial monitoring. The dilator was removed and the sheath was flushed. A 54F JB-1 diagnostic catheter was then advanced over the wire through the existing right femoral access to the proximal descending thoracic aorta. Wire was then removed. Double flush of the catheter was performed. Catheter was then used to select the left cervical internal carotid artery. Formal angiogram was performed, with roadmap achieved. Exchange length Rosen wire was then passed through the diagnostic catheter to the distal cervical ICA and the diagnostic catheter was removed. The 5 French sheath was removed, with attempt at placing 8 French 55 cm bright tip sheath. Significant resistance was encountered with passing the sheath through the right iliac system. The sheath would not advanced through the pre-existing right common iliac artery stent. Sheath was withdrawn and rotated. We attempted to advance the sheath over the introducer which was pinned on the wire. We also removed the introducer in place to diagnostic catheter into the aorta and attempted to pass the sheath over the diagnostic catheter. None of these maneuvers were successful. Sheath was then withdrawn into the external iliac artery. Balloon angioplasty was performed of the pre-existing right common iliac artery stent with 5 mm x 40 mm standard balloon angioplasty. Balloon was removed and the introducer was then replaced. Eight French sheath was then again attempted to pass over the wire, unsuccessful through this segment of the iliac artery. The introducer was removed, diagnostic  catheter was placed into the cervical segment of the internal carotid artery, and the Rose an wire was exchanged for a 260 cm Amplatz wire. With the stiff wire in place, another attempt was made to pass the sheath which was unsuccessful. Sheath was then again withdrawn to the external iliac artery, introducer was removed, and a second balloon angioplasty 6 mm x 40 mm was performed at the pre-existing stent. The sheath was then successfully advanced over the balloon as the balloon was deflated, into the abdominal aorta. The introducer was again placed after removing the balloon and the sheath was advanced 6 successfully into the aorta. Introducer was withdrawn. Once the sheath was advanced over the wire to the thoracic aorta, sheath was flushed and attached to pressurized and heparinized saline bag for constant forward flow. Eight French 95 cm flow gate balloon catheter was then advanced over the wire to the distal cervical segment. Wire was removed. Then a coaxial intermediate catheter and microcatheter combination was prepared on the back table. This  combination was penumbra Ace 64 catheter and a Trevo Provue18 microcatheter, with a synchro soft wire. This combination was then advanced through the balloon guide into the ICA. System was advanced into the internal carotid artery, to the level of the occlusion. The micro wire was then carefully advanced through the occluded segment, with a distal knuckle configuration. Microcatheter was then pushed through the occluded segment and the wire was removed. Intermediate catheter was advanced over the micro wire to the carotid siphon, which would not advance beyond the ophthalmic segment through the terminus. Blood was then aspirated through the hub of the microcatheter, and a gentle contrast injection was performed confirming intraluminal position. A rotating hemostatic valve was then attached to the back end of the microcatheter, and a pressurized and heparinized saline  bag was attached to the catheter. 4 mm x 40 mm solitaire device was then selected. Back flush was achieved at the rotating hemostatic valve, and then the device was gently advanced through the microcatheter to the distal end. The retriever was then unsheathed by withdrawing the microcatheter under fluoroscopy. Once the retriever was completely unsheathed, the microcatheter was carefully stripped from the delivery wire of the device. Control angiogram was then performed from the balloon catheter. 3 minute time interval was observed. The balloon on the balloon guide was then inflated to profile of the vessel. Constant aspiration was then performed through the intermediate catheter, and constant gentle aspiration was performed at the balloon guide. This aspiration was continued as the retriever was gently and slowly withdrawn with fluoroscopic observation into the distal intermediate catheter. The entire system was then gently withdrawn from the intracranial ICA and into the balloon guide. Once the retriever was entirely removed from the system, free aspiration was confirmed at the hub of the balloon guide, with free blood return confirmed. Control angiogram was then performed. TICI 2a flow was achieved. Repeat angiogram with multiple angulation demonstrated persistent occlusion of parietal branch. The same coaxial system was again passed through the balloon guide catheter. The microcatheter system was then advanced through the occlusion of the parietal branch. Once the micro wire microcatheter were beyond the occlusion, the micro wire was removed and stentreiver device was deployed, stripping the microcatheter from the wire. After the device was deployed across the occlusion, the intermediate catheter was again attempted to advanced to the M1 segment. This was unsuccessful, and would not advance beyond the ophthalmic segment. The balloon at the balloon guide was inflated to profile of the vessel. Local aspiration was  performed at the intermediate catheter upon withdrawal of the device under fluoroscopic observation, with aspiration at the balloon guide. Once the retrieve her and intermediate catheter were entirely removed from the system, free aspiration was confirmed at the hub of the intermediate catheter, with free blood return confirmed. Control angiogram was again performed. Improved flow was confirmed, TICI 2b. Balloon guide was withdrawn and angiogram of the cervical segment was performed. We then removed the balloon guide catheter and deployed an 8 French Angio-Seal at the right common femoral artery access. Angiogram was performed of the right sided access and the left-sided access before completing the case. Patient tolerated the procedure well and remained hemodynamically stable throughout. No complications were encountered. Estimated blood loss approximately 50 cc. IMPRESSION: Status post ultrasound guided access right common femoral artery for cerebral angiogram and a combination of aspiration and mechanical thrombectomy of left M1 emergent large vessel occlusion, and restoration of TICI 2b flow after 2 passes. Status post balloon angioplasty to  6 mm of proximal right common iliac artery for treatment of a restrictive lesion in order to pass 8 French sheath to perform the thrombectomy. Status post deployment of Angio-Seal at the right common femoral artery. At the conclusion of the case, a 4 [place] sheath remains in left common femoral artery for arterial monitoring. Signed, Dulcy Fanny. Dellia Nims, RPVI [facility] Radiology PLAN: Formal CT after the case Right hip straight overnight status post 8 French device and Angio-Seal closure Left common femoral artery for French sheath may be removed when hemodynamic monitoring is no longer needed with manual pressure. Goal blood pressure 035 [id number] systolic given the incomplete flow restoration Electronically Signed   By:  Corrie Mckusick D.O.   On: 03/15/2018 20:39   Ir US Guide Vasc Access Right  Result Date: 03/24/2018 INDICATION: 73 year old female with a history of acute left MCA stroke. She presents within time frame for IV tPA, and is a candidate for mechanical thrombectomy EXAM: ULTRASOUND GUIDED ACCESS RIGHT COMMON FEMORAL ARTERY ULTRASOUND GUIDED ACCESS LEFT COMMON FEMORAL ARTERY FOR ARTERIAL MONITORING ANGIOGRAM RIGHT LOWER EXTREMITY BALLOON ANGIOPLASTY STENOSIS RIGHT COMMON ILIAC ARTERY IN ORDER TO PASS 8 FRENCH SHEATH TO COMPLETE THE THROMBECTOMY CEREBRAL ANGIOGRAM MECHANICAL THROMBECTOMY LEFT MCA EMERGENT LARGE VESSEL OCCLUSION DEPLOYMENT OF ANGIO-SEAL RIGHT COMMON FEMORAL ARTERY COMPARISON:  CT imaging of the same day MEDICATIONS: 2 g Ancef IV. The antibiotic was administered within 1 hour of the procedure ANESTHESIA/SEDATION: General endotracheal tube anesthesia CONTRAST:  96 cc Isovue-300 FLUOROSCOPY TIME:  Fluoroscopy Time: 26 minutes 24 seconds (1,362 mGy). COMPLICATIONS: None TECHNIQUE: Informed written consent was obtained from the patient's family after a thorough discussion of the procedural risks, benefits and alternatives. Specific risks discussed include: Bleeding, infection, contrast reaction, kidney injury/failure, need for further procedure/surgery, arterial injury or dissection, embolization to new territory, intracranial hemorrhage (10-15% risk), neurologic deterioration, cardiopulmonary collapse, death. All questions were addressed. Maximal Sterile Barrier Technique was utilized including during the procedure including caps, mask, sterile gowns, sterile gloves, sterile drape, hand hygiene and skin antiseptic. A timeout was performed prior to the initiation of the procedure. The anesthesia team was present to provide general endotracheal tube anesthesia and for patient monitoring during the procedure. Interventional neuro radiology nursing staff was also present. FINDINGS: Initial Findings: Left common  carotid artery: No significant stenosis of the left common carotid artery, with a unremarkable course caliber and contour. Surgical changes of prior endarterectomy evident on prior CT imaging. Left external carotid artery: Patent with antegrade flow. Left internal carotid artery: Normal course caliber and contour of the cervical portion. Vertical and petrous segment patent with normal course caliber contour. Cavernous segment patent. Clinoid segment patent. Antegrade flow of the ophthalmic artery. Ophthalmic segment patent. Terminus patent. Left MCA: Distal M1 occluded at the initiation of the case. There is partial filling of an inferior branch which is the non dominant branch to the frontal. Patent fetal PCA to the left P2 segment. Left ACA: A 1 segment patent. A 2 segment perfuses the right territory. Completion Findings: Left MCA: After 2 passes of mechanical thrombectomy plus aspiration, there is near complete restoration of flow through the left MCA territory, with slow flow through an M3/M4 branch of the parietal region compatible with TI CI 2b flow. 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 Physical exam At the initiation of the case, palpable bilateral common femoral artery pulses present No palpable distal pulses Doppler positive pulses of the right posterior tibial and  anterior tibial (with systolic blood pressures above 200) Doppler positive pulse at the left posterior tibial (with systolic blood pressure above 200), with no Doppler signal at the left anterior tibial At completion of case Doppler positive pulses were only discovered on the right posterior tibial and anterior tibial arteries with blood pressures above 200. In the 140-160 range (goal) Doppler signal was not evident A completion of case Doppler positive pulses were only discovered on the left at the posterior tibial and not the left anterior tibial (with the pressure above 500 systolic). PROCEDURE:  Patient is brought emergently to the neuro angiography suite, with the patient identified appropriately and placed supine position on the table. Left radial arterial line was attempted by the anesthesia team. The patient is then prepped and draped in the usual sterile fashion. Ultrasound survey of the right inguinal region was performed with images stored and sent to PACs. 11 blade scalpel was used to make a small incision. Blunt dissection was performed. A micropuncture needle was used access the right common femoral artery under ultrasound. With excellent arterial blood flow returned, an .018 micro wire was passed through the needle, observed to enter the abdominal aorta under fluoroscopy. The needle was removed, and a micropuncture sheath was placed over the wire. The inner dilator and wire were removed, and an 035 Bentson wire was advanced under fluoroscopy into the abdominal aorta. The sheath was removed and a standard 5 [place] vascular sheath was placed. The dilator was removed and the sheath was flushed. Ultrasound survey of the left inguinal region was then performed with images stored and sent to PACs, confirming patency of the vessel. A micropuncture needle was used access the left common femoral artery under ultrasound. With excellent arterial blood flow returned, and an .018 micro wire was passed through the needle, observed enter the abdominal aorta under fluoroscopy. The needle was removed, and a micropuncture sheath was placed over the wire. The inner dilator and wire were removed, and an 035 Bentson wire was advanced under fluoroscopy into the abdominal aorta. The sheath was removed and a standard 4 [place] vascular sheath was placed for arterial monitoring. The dilator was removed and the sheath was flushed. A 77F JB-1 diagnostic catheter was then advanced over the wire through the existing right femoral access to the proximal descending thoracic aorta. Wire was then removed. Double flush of the  catheter was performed. Catheter was then used to select the left cervical internal carotid artery. Formal angiogram was performed, with roadmap achieved. Exchange length Rosen wire was then passed through the diagnostic catheter to the distal cervical ICA and the diagnostic catheter was removed. The 5 French sheath was removed, with attempt at placing 8 French 55 cm bright tip sheath. Significant resistance was encountered with passing the sheath through the right iliac system. The sheath would not advanced through the pre-existing right common iliac artery stent. Sheath was withdrawn and rotated. We attempted to advance the sheath over the introducer which was pinned on the wire. We also removed the introducer in place to diagnostic catheter into the aorta and attempted to pass the sheath over the diagnostic catheter. None of these maneuvers were successful. Sheath was then withdrawn into the external iliac artery. Balloon angioplasty was performed of the pre-existing right common iliac artery stent with 5 mm x 40 mm standard balloon angioplasty. Balloon was removed and the introducer was then replaced. Eight French sheath was then again attempted to pass over the wire, unsuccessful through this segment of  the iliac artery. The introducer was removed, diagnostic catheter was placed into the cervical segment of the internal carotid artery, and the Rose an wire was exchanged for a 260 cm Amplatz wire. With the stiff wire in place, another attempt was made to pass the sheath which was unsuccessful. Sheath was then again withdrawn to the external iliac artery, introducer was removed, and a second balloon angioplasty 6 mm x 40 mm was performed at the pre-existing stent. The sheath was then successfully advanced over the balloon as the balloon was deflated, into the abdominal aorta. The introducer was again placed after removing the balloon and the sheath was advanced 6 successfully into the aorta. Introducer was  withdrawn. Once the sheath was advanced over the wire to the thoracic aorta, sheath was flushed and attached to pressurized and heparinized saline bag for constant forward flow. Eight French 95 cm flow gate balloon catheter was then advanced over the wire to the distal cervical segment. Wire was removed. Then a coaxial intermediate catheter and microcatheter combination was prepared on the back table. This combination was penumbra Ace 64 catheter and a Trevo Provue18 microcatheter, with a synchro soft wire. This combination was then advanced through the balloon guide into the ICA. System was advanced into the internal carotid artery, to the level of the occlusion. The micro wire was then carefully advanced through the occluded segment, with a distal knuckle configuration. Microcatheter was then pushed through the occluded segment and the wire was removed. Intermediate catheter was advanced over the micro wire to the carotid siphon, which would not advance beyond the ophthalmic segment through the terminus. Blood was then aspirated through the hub of the microcatheter, and a gentle contrast injection was performed confirming intraluminal position. A rotating hemostatic valve was then attached to the back end of the microcatheter, and a pressurized and heparinized saline bag was attached to the catheter. 4 mm x 40 mm solitaire device was then selected. Back flush was achieved at the rotating hemostatic valve, and then the device was gently advanced through the microcatheter to the distal end. The retriever was then unsheathed by withdrawing the microcatheter under fluoroscopy. Once the retriever was completely unsheathed, the microcatheter was carefully stripped from the delivery wire of the device. Control angiogram was then performed from the balloon catheter. 3 minute time interval was observed. The balloon on the balloon guide was then inflated to profile of the vessel. Constant aspiration was then performed  through the intermediate catheter, and constant gentle aspiration was performed at the balloon guide. This aspiration was continued as the retriever was gently and slowly withdrawn with fluoroscopic observation into the distal intermediate catheter. The entire system was then gently withdrawn from the intracranial ICA and into the balloon guide. Once the retriever was entirely removed from the system, free aspiration was confirmed at the hub of the balloon guide, with free blood return confirmed. Control angiogram was then performed. TICI 2a flow was achieved. Repeat angiogram with multiple angulation demonstrated persistent occlusion of parietal branch. The same coaxial system was again passed through the balloon guide catheter. The microcatheter system was then advanced through the occlusion of the parietal branch. Once the micro wire microcatheter were beyond the occlusion, the micro wire was removed and stentreiver device was deployed, stripping the microcatheter from the wire. After the device was deployed across the occlusion, the intermediate catheter was again attempted to advanced to the M1 segment. This was unsuccessful, and would not advance beyond the ophthalmic segment. The balloon  at the balloon guide was inflated to profile of the vessel. Local aspiration was performed at the intermediate catheter upon withdrawal of the device under fluoroscopic observation, with aspiration at the balloon guide. Once the retrieve her and intermediate catheter were entirely removed from the system, free aspiration was confirmed at the hub of the intermediate catheter, with free blood return confirmed. Control angiogram was again performed. Improved flow was confirmed, TICI 2b. Balloon guide was withdrawn and angiogram of the cervical segment was performed. We then removed the balloon guide catheter and deployed an 8 French Angio-Seal at the right common femoral artery access. Angiogram was performed of the right sided  access and the left-sided access before completing the case. Patient tolerated the procedure well and remained hemodynamically stable throughout. No complications were encountered. Estimated blood loss approximately 50 cc. IMPRESSION: Status post ultrasound guided access right common femoral artery for cerebral angiogram and a combination of aspiration and mechanical thrombectomy of left M1 emergent large vessel occlusion, and restoration of TICI 2b flow after 2 passes. Status post balloon angioplasty to 6 mm of proximal right common iliac artery for treatment of a restrictive lesion in order to pass 8 French sheath to perform the thrombectomy. Status post deployment of Angio-Seal at the right common femoral artery. At the conclusion of the case, a 4 [place] sheath remains in left common femoral artery for arterial monitoring. Signed, Dulcy Fanny. Dellia Nims, RPVI [facility] Radiology PLAN: Formal CT after the case Right hip straight overnight status post 8 French device and Angio-Seal closure Left common femoral artery for French sheath may be removed when hemodynamic monitoring is no longer needed with manual pressure. Goal blood pressure 268 [id number] systolic given the incomplete flow restoration Electronically Signed   By: Corrie Mckusick D.O.   On: 03/28/2018 20:39   Dg Chest Port 1 View  Result Date: 03/13/2018 CLINICAL DATA:  Central line placement EXAM: PORTABLE CHEST 1 VIEW COMPARISON:  Chest radiograph from earlier today. FINDINGS: Endotracheal tube tip is 1.8 cm above the carina. Enteric tube enters stomach with the tip not seen on this image. Right internal jugular central venous catheter terminates in the middle third of the SVC. Stable cardiomediastinal silhouette with mild cardiomegaly. No pneumothorax. No pleural effusion. Hazy parahilar lung opacities are stable. IMPRESSION: 1. Right internal jugular central venous catheter terminates in the middle  third of the SVC. No pneumothorax. 2. Endotracheal tube tip 1.8 cm above the carina. 3. Stable mild cardiomegaly. Stable hazy parahilar lung opacities, favor pulmonary edema. Electronically Signed   By: Ilona Sorrel M.D.   On: 03/13/2018 15:14   Dg Chest Port 1 View  Result Date: 03/13/2018 CLINICAL DATA:  Endotracheal tube EXAM: PORTABLE CHEST 1 VIEW COMPARISON:  03/12/2018 FINDINGS: The heart is moderately enlarged. NG tube is stable. Endotracheal tube has been retracted and the tip is now 3.2 cm from the carina. There is hazy pulmonary opacity bilaterally which is central and basilar likely combination of pleural fluid and airspace disease. No pneumothorax. Vascular congestion. IMPRESSION: Stable bilateral pleural effusions and bilateral hazy airspace disease. Electronically Signed   By: Marybelle Killings M.D.   On: 03/13/2018 07:47   Dg Chest Port 1 View  Result Date: 03/12/2018 CLINICAL DATA:  Endotracheal tube placement. EXAM: PORTABLE CHEST 1 VIEW COMPARISON:  Chest radiograph performed 03/11/2018 FINDINGS: The patient's endotracheal tube is seen ending 1-2 cm above the carina. This could be retracted 2 cm. The patient's enteric tube is noted  extending below the diaphragm. Small bilateral pleural effusions are noted. Hazy bilateral airspace opacities raise concern for pulmonary edema. No pneumothorax is seen. The cardiomediastinal silhouette is mildly enlarged. No acute osseous abnormalities are identified. IMPRESSION: 1. Endotracheal tube seen ending 1-2 cm above the carina. This could be retracted 2 cm. 2. Small bilateral pleural effusions noted. Hazy bilateral airspace opacities raise concern for pulmonary edema. 3. Mild cardiomegaly. Electronically Signed   By: Garald Balding M.D.   On: 03/12/2018 06:38   Dg Chest Port 1 View  Result Date: 03/11/2018 CLINICAL DATA:  Acute respiratory failure with hypoxia. EXAM: PORTABLE CHEST 1 VIEW COMPARISON:  03/10/2018 FINDINGS: Endotracheal tube has tip 5.6 cm  above the carina. Nasogastric tube unchanged with tip over the stomach in the left upper quadrant. Lungs are adequately inflated with minimal persistent left retrocardiac opacification. Mild stable cardiomegaly. Remainder of the exam is unchanged. IMPRESSION: Mild left retrocardiac opacification unchanged likely atelectasis. Mild stable cardiomegaly. Tubes and lines as described. Electronically Signed   By: Marin Olp M.D.   On: 03/11/2018 08:17   Dg Chest Port 1 View  Result Date: 03/10/2018 CLINICAL DATA:  Cerebral infarction and respiratory failure. EXAM: PORTABLE CHEST 1 VIEW COMPARISON:  03/25/2018 FINDINGS: Endotracheal tube remains present with the tip approximately 3 cm above the carina. Gastric decompression tube has been advanced and now extends further into the stomach. Stable significant cardiac enlargement. Lungs show some improved aeration at the right base with mild residual right basilar atelectasis remaining. Left lower lobe atelectasis is relatively stable. No pulmonary edema, significant pleural fluid or pneumothorax. IMPRESSION: Advancement of gastric decompression tube further into the stomach. Improved aeration at the right lung base with residual mild right basilar atelectasis. Stable left lower lobe atelectasis. Electronically Signed   By: Aletta Edouard M.D.   On: 03/10/2018 08:18   Portable Chest X-ray  Result Date: 03/11/2018 CLINICAL DATA:  Intubated EXAM: PORTABLE CHEST 1 VIEW COMPARISON:  06/01/2017 chest radiograph. FINDINGS: Endotracheal tube tip is 4.2 cm above the carina. Enteric tube terminates at the esophagogastric junction with the side port in the lower thoracic esophagus. Stable cardiomediastinal silhouette with moderate cardiomegaly. No pneumothorax. No pleural effusion. Hazy lower parahilar lung opacities, most prominent in the right lower lung, worsened. IMPRESSION: 1. Well-positioned endotracheal tube. 2. Enteric tube terminates at the esophagogastric junction  with the side port in the lower thoracic esophagus, consider advancing 8-10 cm. 3. Moderate cardiomegaly. Worsening hazy lower parahilar lung opacities, most prominent in the right lower lung, favor pulmonary edema. Electronically Signed   By: Ilona Sorrel M.D.   On: 03/22/2018 22:12   Ir Percutaneous Art Thrombectomy/infusion Intracranial Inc Diag Angio  Result Date: 03/13/2018 INDICATION: 73 year old female with a history of acute left MCA stroke. She presents within time frame for IV tPA, and is a candidate for mechanical thrombectomy EXAM: ULTRASOUND GUIDED ACCESS RIGHT COMMON FEMORAL ARTERY ULTRASOUND GUIDED ACCESS LEFT COMMON FEMORAL ARTERY FOR ARTERIAL MONITORING ANGIOGRAM RIGHT LOWER EXTREMITY BALLOON ANGIOPLASTY STENOSIS RIGHT COMMON ILIAC ARTERY IN ORDER TO PASS 8 FRENCH SHEATH TO COMPLETE THE THROMBECTOMY CEREBRAL ANGIOGRAM MECHANICAL THROMBECTOMY LEFT MCA EMERGENT LARGE VESSEL OCCLUSION DEPLOYMENT OF ANGIO-SEAL RIGHT COMMON FEMORAL ARTERY COMPARISON:  CT imaging of the same day MEDICATIONS: 2 g Ancef IV. The antibiotic was administered within 1 hour of the procedure ANESTHESIA/SEDATION: General endotracheal tube anesthesia CONTRAST:  96 cc Isovue-300 FLUOROSCOPY TIME:  Fluoroscopy Time: 26 minutes 24 seconds (1,362 mGy). COMPLICATIONS: None TECHNIQUE: Informed written consent was obtained from the patient's  family after a thorough discussion of the procedural risks, benefits and alternatives. Specific risks discussed include: Bleeding, infection, contrast reaction, kidney injury/failure, need for further procedure/surgery, arterial injury or dissection, embolization to new territory, intracranial hemorrhage (10-15% risk), neurologic deterioration, cardiopulmonary collapse, death. All questions were addressed. Maximal Sterile Barrier Technique was utilized including during the procedure including caps, mask, sterile gowns, sterile gloves, sterile drape, hand hygiene and skin antiseptic. A timeout was  performed prior to the initiation of the procedure. The anesthesia team was present to provide general endotracheal tube anesthesia and for patient monitoring during the procedure. Interventional neuro radiology nursing staff was also present. 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 Surgical changes of prior endarterectomy evident on prior CT imaging. Left external carotid artery: Patent with antegrade flow. Left internal carotid artery: Normal course caliber and contour of the cervical portion. Vertical and petrous segment patent with normal course caliber contour. Cavernous segment patent. Clinoid segment patent. Antegrade flow of the ophthalmic artery. Ophthalmic segment patent. Terminus patent. Left MCA: Distal M1 occluded at the initiation of the case. There is partial filling of an inferior branch which is the non dominant branch to the frontal. Patent fetal PCA to the left P2 segment. Left ACA: A 1 segment patent. A 2 segment perfuses the right territory. Completion Findings: Left MCA: After 2 passes of mechanical thrombectomy plus aspiration, there is near complete restoration of flow through the left MCA territory, with slow flow through an M3/M4 branch of the parietal region compatible with TI CI 2b flow. 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 Physical exam At the initiation of the case, palpable bilateral common femoral artery pulses present No palpable distal pulses Doppler positive pulses of the right posterior tibial and anterior tibial (with systolic blood pressures above 200) Doppler positive pulse at the left posterior tibial (with systolic blood pressure above 200), with no Doppler signal at the left anterior tibial At completion of case Doppler positive pulses were only discovered on the right posterior tibial and anterior tibial  arteries with blood pressures above 200. In the 140-160 range (goal) Doppler signal was not evident A completion of case Doppler positive pulses were only discovered on the left at the posterior tibial and not the left anterior tibial (with the pressure above 295 systolic). PROCEDURE: Patient is brought emergently to the neuro angiography suite, with the patient identified appropriately and placed supine position on the table. Left radial arterial line was attempted by the anesthesia team. The patient is then prepped and draped in the usual sterile fashion. Ultrasound survey of the right inguinal region was performed with images stored and sent to PACs. 11 blade scalpel was used to make a small incision. Blunt dissection was performed. A micropuncture needle was used access the right common femoral artery under ultrasound. With excellent arterial blood flow returned, an .018 micro wire was passed through the needle, observed to enter the abdominal aorta under fluoroscopy. The needle was removed, and a micropuncture sheath was placed over the wire. The inner dilator and wire were removed, and an 035 Bentson wire was advanced under fluoroscopy into the abdominal aorta. The sheath was removed and a standard 5 [place] vascular sheath was placed. The dilator was removed and the sheath was flushed. Ultrasound survey of the left inguinal region was then performed with images stored and sent to PACs, confirming patency of the vessel.  A micropuncture needle was used access the left common femoral artery under ultrasound. With excellent arterial blood flow returned, and an .018 micro wire was passed through the needle, observed enter the abdominal aorta under fluoroscopy. The needle was removed, and a micropuncture sheath was placed over the wire. The inner dilator and wire were removed, and an 035 Bentson wire was advanced under fluoroscopy into the abdominal aorta. The sheath was removed and a standard 4 [place] vascular  sheath was placed for arterial monitoring. The dilator was removed and the sheath was flushed. A 7F JB-1 diagnostic catheter was then advanced over the wire through the existing right femoral access to the proximal descending thoracic aorta. Wire was then removed. Double flush of the catheter was performed. Catheter was then used to select the left cervical internal carotid artery. Formal angiogram was performed, with roadmap achieved. Exchange length Rosen wire was then passed through the diagnostic catheter to the distal cervical ICA and the diagnostic catheter was removed. The 5 French sheath was removed, with attempt at placing 8 French 55 cm bright tip sheath. Significant resistance was encountered with passing the sheath through the right iliac system. The sheath would not advanced through the pre-existing right common iliac artery stent. Sheath was withdrawn and rotated. We attempted to advance the sheath over the introducer which was pinned on the wire. We also removed the introducer in place to diagnostic catheter into the aorta and attempted to pass the sheath over the diagnostic catheter. None of these maneuvers were successful. Sheath was then withdrawn into the external iliac artery. Balloon angioplasty was performed of the pre-existing right common iliac artery stent with 5 mm x 40 mm standard balloon angioplasty. Balloon was removed and the introducer was then replaced. Eight French sheath was then again attempted to pass over the wire, unsuccessful through this segment of the iliac artery. The introducer was removed, diagnostic catheter was placed into the cervical segment of the internal carotid artery, and the Rose an wire was exchanged for a 260 cm Amplatz wire. With the stiff wire in place, another attempt was made to pass the sheath which was unsuccessful. Sheath was then again withdrawn to the external iliac artery, introducer was removed, and a second balloon angioplasty 6 mm x 40 mm was  performed at the pre-existing stent. The sheath was then successfully advanced over the balloon as the balloon was deflated, into the abdominal aorta. The introducer was again placed after removing the balloon and the sheath was advanced 6 successfully into the aorta. Introducer was withdrawn. Once the sheath was advanced over the wire to the thoracic aorta, sheath was flushed and attached to pressurized and heparinized saline bag for constant forward flow. Eight French 95 cm flow gate balloon catheter was then advanced over the wire to the distal cervical segment. Wire was removed. Then a coaxial intermediate catheter and microcatheter combination was prepared on the back table. This combination was penumbra Ace 64 catheter and a Trevo Provue18 microcatheter, with a synchro soft wire. This combination was then advanced through the balloon guide into the ICA. System was advanced into the internal carotid artery, to the level of the occlusion. The micro wire was then carefully advanced through the occluded segment, with a distal knuckle configuration. Microcatheter was then pushed through the occluded segment and the wire was removed. Intermediate catheter was advanced over the micro wire to the carotid siphon, which would not advance beyond the ophthalmic segment through the terminus. Blood was then aspirated  through the hub of the microcatheter, and a gentle contrast injection was performed confirming intraluminal position. A rotating hemostatic valve was then attached to the back end of the microcatheter, and a pressurized and heparinized saline bag was attached to the catheter. 4 mm x 40 mm solitaire device was then selected. Back flush was achieved at the rotating hemostatic valve, and then the device was gently advanced through the microcatheter to the distal end. The retriever was then unsheathed by withdrawing the microcatheter under fluoroscopy. Once the retriever was completely unsheathed, the microcatheter  was carefully stripped from the delivery wire of the device. Control angiogram was then performed from the balloon catheter. 3 minute time interval was observed. The balloon on the balloon guide was then inflated to profile of the vessel. Constant aspiration was then performed through the intermediate catheter, and constant gentle aspiration was performed at the balloon guide. This aspiration was continued as the retriever was gently and slowly withdrawn with fluoroscopic observation into the distal intermediate catheter. The entire system was then gently withdrawn from the intracranial ICA and into the balloon guide. Once the retriever was entirely removed from the system, free aspiration was confirmed at the hub of the balloon guide, with free blood return confirmed. Control angiogram was then performed. TICI 2a flow was achieved. Repeat angiogram with multiple angulation demonstrated persistent occlusion of parietal branch. The same coaxial system was again passed through the balloon guide catheter. The microcatheter system was then advanced through the occlusion of the parietal branch. Once the micro wire microcatheter were beyond the occlusion, the micro wire was removed and stentreiver device was deployed, stripping the microcatheter from the wire. After the device was deployed across the occlusion, the intermediate catheter was again attempted to advanced to the M1 segment. This was unsuccessful, and would not advance beyond the ophthalmic segment. The balloon at the balloon guide was inflated to profile of the vessel. Local aspiration was performed at the intermediate catheter upon withdrawal of the device under fluoroscopic observation, with aspiration at the balloon guide. Once the retrieve her and intermediate catheter were entirely removed from the system, free aspiration was confirmed at the hub of the intermediate catheter, with free blood return confirmed. Control angiogram was again performed.  Improved flow was confirmed, TICI 2b. Balloon guide was withdrawn and angiogram of the cervical segment was performed. We then removed the balloon guide catheter and deployed an 8 French Angio-Seal at the right common femoral artery access. Angiogram was performed of the right sided access and the left-sided access before completing the case. Patient tolerated the procedure well and remained hemodynamically stable throughout. No complications were encountered. Estimated blood loss approximately 50 cc. IMPRESSION: Status post ultrasound guided access right common femoral artery for cerebral angiogram and a combination of aspiration and mechanical thrombectomy of left M1 emergent large vessel occlusion, and restoration of TICI 2b flow after 2 passes. Status post balloon angioplasty to 6 mm of proximal right common iliac artery for treatment of a restrictive lesion in order to pass 8 French sheath to perform the thrombectomy. Status post deployment of Angio-Seal at the right common femoral artery. At the conclusion of the case, a 4 [place] sheath remains in left common femoral artery for arterial monitoring. Signed, Dulcy Fanny. Dellia Nims, RPVI [facility] Radiology PLAN: Formal CT after the case Right hip straight overnight status post 8 French device and Angio-Seal closure Left common femoral artery for French sheath may be removed when hemodynamic  monitoring is no longer needed with manual pressure. Goal blood pressure 235 [id number] systolic given the incomplete flow restoration Electronically Signed   By: Corrie Mckusick D.O.   On: 04/04/2018 20:39   Ct Head Code Stroke Wo Contrast  Result Date: 03/14/2018 CLINICAL DATA:  Code stroke.  Deficit not specified EXAM: CT HEAD WITHOUT CONTRAST TECHNIQUE: Contiguous axial images were obtained from the base of the skull through the vertex without intravenous contrast. COMPARISON:  Head CT 07/18/2016 and brain MRI 07/20/2016  FINDINGS: Brain: Multiple remote lacunar infarcts in the deep white matter and deep gray nuclei, chronic when compared to prior head CT and brain MRI. Small remote left cerebellar infarct. No evidence of acute infarct. No hemorrhage or hydrocephalus. Vascular: No hyperdense vessel. Skull: Normal. Negative for fracture or focal lesion. Sinuses/Orbits: No acute finding. Other: These results were communicated to Dr. Rory Percy at 4:43 pmon 01/12/2020by text page via the [facility] messaging system. ASPECTS [facility] Stroke Program Early CT Score) Not scored without localizing symptoms. Motion artifact at the vertex blurring cortex. IMPRESSION: 1. No acute finding. 2. Motion artifact at the vertex. 3. Multiple remote small vessel infarcts. Electronically Signed   By: Monte Fantasia M.D.   On: 03/28/2018 16:45   Vas [place] Lower Extremity Venous (dvt)  Result Date: 03/09/2018  Lower Venous Study Indications: Stroke.  Limitations: Body habitus. Performing Technologist: Maudry Mayhew MHA, RDMS, RVT, RDCS  Examination Guidelines: A complete evaluation includes B-mode imaging, spectral Doppler, color Doppler, and power Doppler as needed of all accessible portions of each vessel. Bilateral testing is considered an integral part of a complete examination. Limited examinations for reoccurring indications may be performed as noted.  Right Venous Findings: +---------+---------------+---------+-----------+----------+--------------+          CompressibilityPhasicitySpontaneityPropertiesSummary        +---------+---------------+---------+-----------+----------+--------------+ CFV                                                   Not visualized +---------+---------------+---------+-----------+----------+--------------+ SFJ                                                   Not visualized +---------+---------------+---------+-----------+----------+--------------+ FV Prox  Full                                                         +---------+---------------+---------+-----------+----------+--------------+ FV Mid   Full                                                        +---------+---------------+---------+-----------+----------+--------------+ FV DistalFull                                                        +---------+---------------+---------+-----------+----------+--------------+ PFV  Full                                                        +---------+---------------+---------+-----------+----------+--------------+ POP      Full           Yes      Yes                                 +---------+---------------+---------+-----------+----------+--------------+ PTV      Full                                                        +---------+---------------+---------+-----------+----------+--------------+ PERO                                                  Not visualized +---------+---------------+---------+-----------+----------+--------------+  Left Venous Findings: +---------+---------------+---------+-----------+----------+-------+          CompressibilityPhasicitySpontaneityPropertiesSummary +---------+---------------+---------+-----------+----------+-------+ CFV      Full           Yes      Yes                          +---------+---------------+---------+-----------+----------+-------+ SFJ      Full                                                 +---------+---------------+---------+-----------+----------+-------+ FV Prox  Full                                                 +---------+---------------+---------+-----------+----------+-------+ FV Mid   Full                                                 +---------+---------------+---------+-----------+----------+-------+ FV DistalFull                                                 +---------+---------------+---------+-----------+----------+-------+ PFV      Full                                                  +---------+---------------+---------+-----------+----------+-------+ POP      Full           Yes      Yes                          +---------+---------------+---------+-----------+----------+-------+  PTV      Full                                                 +---------+---------------+---------+-----------+----------+-------+ PERO     Full                                                 +---------+---------------+---------+-----------+----------+-------+    Summary: Right: There is no evidence of deep vein thrombosis in the lower extremity. However, portions of this examination were limited- see technologist comments above. No cystic structure found in the popliteal fossa. Left: There is no evidence of deep vein thrombosis in the lower extremity. No cystic structure found in the popliteal fossa.  *See table(s) above for measurements and observations. Electronically signed by Curt Jews MD on 03/09/2018 at 5:40:37 PM.    Final     PHYSICAL EXAM  Temp:  [97.3 F (36.3 C)-99.7 F (37.6 C)] 98.8 F (37.1 C) (01/08 1200) Pulse Rate:  [65-80] 73 (01/08 1143) Resp:  [16-34] 24 (01/08 1143) BP: (88-204)/(41-78) 88/59 (01/08 1143) SpO2:  [100 %] 100 % (01/08 1143) Arterial Line BP: (98-199)/(40-79) 167/46 (01/08 1100) FiO2 (%):  [40 %] 40 % (01/08 1143) Weight:  [73.5 kg] 73.5 kg (01/08 0500)  General - Well nourished, well developed, intubated on sedation.  Ophthalmologic - fundi not visualized due to noncooperation.  Cardiovascular - Regular rate and rhythm.  Neuro - intubated now off  sedation,   she is awake aphasic and unresponsive and not following commands. Globally aphasic Eyes mid position, no deviation, however no doll's eyes.  Not blinking to visual threat bilaterally.  Pupil 2 mm bilaterally, reactive to light.  Left corneal present, right corneal weak reflexes.  Positive gag and cough.  Facial symmetry not able to test due to  ET tube.  Left upper and lower extremity spontaneous movements against gravity2/5 on pain stimulation.  Right upper and lower extremity trace withdrawal to pain stimulaiton.  DTR 1+, Babinski negative. Sensation, coordination and gait not tested.   ASSESSMENT/PLAN Ms. AYANNI TUN is a 73 y.o. female with history of stroke, hypertension, PVD, diabetes, hyperlipidemia, status post right CEA, CHF admitted for right-sided weakness and aphasia. TPA given.    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  However, cardioembolic source cannot be excluded.  Resultant intubated on sedation, right hemiparesis  CT showed multiple old lacunar infarcts  CTA head and neck left M1/M2 occlusion.  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  Right precentral gyrus petechial hemorrhage.  MRA left MCA patent with moderate M2 origin stenosis  CT head repeat shows slight increase in right precentral gyrus petechial hemorrhage given on DAPT  2D Echo EF 30 to 35% down from 40 to 45% in 05/2017  LE venous Doppler no DVT  LDL 92  HgbA1c 7.1  SCDs for VTE prophylaxis  aspirin 81 mg daily and clopidogrel 75 mg  daily prior to admission, now on aspirin 81 and Plavix due to non-STEMI.   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 Einar Gip as outpatient  If patient has reasonable neurological functional outcome, may consider outpatient follow-up with vascular surgery for subclavian artery stenting  Non-STEMI and cardiomyopathy  Troponin 4.62-6.47-5.08-4.7-3.8-3.88  EF 30 to 35% down from 40 to 45% in 05/2017  Cardiology on board  No intervention recommended  Started on aspirin and Plavix  CT head repeat 03/11/18 shows slight increase in [facility] in  right precentral gyrus petechial hemorrhage given on DAPT  Tapered off nitroglycerin IV  AKI on CKD stage III  creatinine 2.20-2.25->2.97.4.68  CCM on board  BMP monitoring  continue IV fluid   Diabetes  HgbA1c 7.1 goal < 7.0  Uncontrolled  CBG monitoring  SSI  Hypertension . Stable . 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  Put on DAPT and Lipitor 80  Other Active Problems  Hypokalemia-supplement  Hyperglycemia   Hospital day # 7 Plan : Patient's condition remains critical with renal function worsening after CVVRT stopped but now liver enzymes elevated. Neurologically she remains unchanged..  I had a long discussion with the patient's daughter and Dr.Agarwala from critical care medicine a I spoke to the patient's  daughter>they understand her poor prognosis but would like to support her for the next few days and agree with DO NOT RESUSCITATE and one-way extubation  On Friday March 24, 2018 and no prolonged ventilatory support, trach, peg.This patient is critically ill due to left MCA stroke, severe multivessel atherosclerosis and stenosis, CHF, non-STEMI, hyperglycemia and at significant risk of neurological  worsening, death form recurrent stroke, hemorrhagic conversion, heart failure, seizure, DKA. This patient's care requires constant monitoring of vital signs, hemodynamics, respiratory and cardiac monitoring, review of multiple databases, neurological assessment, discussion with family, other specialists and medical decision making of high complexity. I spent 30 minutes of neurocritical care time in the care of this patient. I had long discussion with her daughter at bedside, updated pt current condition, treatment plan and potential prognosis.She expressed understanding and appreciation.    Antony Contras, MD Stroke Neurology 03/15/2018 1:47 PM    To contact Stroke Continuity provider, please refer to [email or web address] After hours, contact General Neurology

## 2018-03-15 NOTE — Progress Notes (Signed)
Patient placed back on full support at this time due to increase in HR(jumped up to 190 at one point). Will continue to monitor.

## 2018-03-15 NOTE — Progress Notes (Signed)
7fr sheath removed from left groin at 1246 hrs using manual pressure and hemostasis v pad.  Pressure held for 12 minutes, and hemostasis achieved.  Site reviewed with Pt's RN Cyril Mourning.  No complications.  [employer]

## 2018-03-16 ENCOUNTER — Inpatient Hospital Stay (HOSPITAL_COMMUNITY): Payer: PPO

## 2018-03-16 DIAGNOSIS — I6389 Other cerebral infarction: Secondary | ICD-10-CM

## 2018-03-16 LAB — BASIC METABOLIC PANEL
ANION GAP: 16 — AB (ref 5–15)
BUN: 127 mg/dL — ABNORMAL HIGH (ref 8–23)
CO2: 19 mmol/L — ABNORMAL LOW (ref 22–32)
Calcium: 7.9 mg/dL — ABNORMAL LOW (ref 8.9–10.3)
Chloride: 100 mmol/L (ref 98–111)
Creatinine, Ser: 5.18 mg/dL — ABNORMAL HIGH (ref 0.44–1.00)
GFR calc Af Amer: 9 mL/min — ABNORMAL LOW (ref >60)
GFR calc non Af Amer: 8 mL/min — ABNORMAL LOW (ref >60)
Glucose, Bld: 246 mg/dL — ABNORMAL HIGH (ref 70–99)
Potassium: 5.5 mmol/L — ABNORMAL HIGH (ref 3.5–5.1)
Sodium: 135 mmol/L (ref 135–145)

## 2018-03-16 LAB — GLUCOSE, CAPILLARY
Glucose-Capillary: 187 mg/dL — ABNORMAL HIGH (ref 70–99)
Glucose-Capillary: 176 mg/dL — ABNORMAL HIGH (ref 70–99)
Glucose-Capillary: 221 mg/dL — ABNORMAL HIGH (ref 70–99)
Glucose-Capillary: 230 mg/dL — ABNORMAL HIGH (ref 70–99)
Glucose-Capillary: 202 mg/dL — ABNORMAL HIGH (ref 70–99)
Glucose-Capillary: 223 mg/dL — ABNORMAL HIGH (ref 70–99)

## 2018-03-16 LAB — COMPREHENSIVE METABOLIC PANEL
ALT: 500 U/L — ABNORMAL HIGH (ref 0–44)
AST: 252 U/L — ABNORMAL HIGH (ref 15–41)
Albumin: 1.6 g/dL — ABNORMAL LOW (ref 3.5–5.0)
Alkaline Phosphatase: 457 U/L — ABNORMAL HIGH (ref 38–126)
Anion gap: 15 (ref 5–15)
BUN: 113 mg/dL — ABNORMAL HIGH (ref 8–23)
CHLORIDE: 100 mmol/L (ref 98–111)
CO2: 20 mmol/L — ABNORMAL LOW (ref 22–32)
Calcium: 8.1 mg/dL — ABNORMAL LOW (ref 8.9–10.3)
Creatinine, Ser: 4.77 mg/dL — ABNORMAL HIGH (ref 0.44–1.00)
GFR calc Af Amer: 10 mL/min — ABNORMAL LOW (ref >60)
GFR calc non Af Amer: 9 mL/min — ABNORMAL LOW (ref >60)
Glucose, Bld: 195 mg/dL — ABNORMAL HIGH (ref 70–99)
Potassium: 5.5 mmol/L — ABNORMAL HIGH (ref 3.5–5.1)
Sodium: 135 mmol/L (ref 135–145)
Total Bilirubin: 1 mg/dL (ref 0.3–1.2)
Total Protein: 5.9 g/dL — ABNORMAL LOW (ref 6.5–8.1)

## 2018-03-16 LAB — CBC
HCT: 25.9 % — ABNORMAL LOW (ref 36.0–46.0)
Hemoglobin: 8.4 g/dL — ABNORMAL LOW (ref 12.0–15.0)
MCH: 30.8 pg (ref 26.0–34.0)
MCHC: 32.4 g/dL (ref 30.0–36.0)
MCV: 94.9 fL (ref 80.0–100.0)
Platelets: 103 10*3/uL — ABNORMAL LOW (ref 150–400)
RBC: 2.73 MIL/uL — ABNORMAL LOW (ref 3.87–5.11)
RDW: 14.5 % (ref 11.5–15.5)
WBC: 15.6 10*3/uL — ABNORMAL HIGH (ref 4.0–10.5)
nRBC: 1.3 % — ABNORMAL HIGH (ref 0.0–0.2)

## 2018-03-16 LAB — C DIFFICILE QUICK SCREEN W PCR REFLEX
C DIFFICILE (CDIFF) INTERP: NOT DETECTED
C DIFFICILE (CDIFF) TOXIN: NEGATIVE
C DIFFICLE (CDIFF) ANTIGEN: NEGATIVE

## 2018-03-16 LAB — MAGNESIUM: MAGNESIUM: 2.7 mg/dL — AB (ref 1.7–2.4)

## 2018-03-16 LAB — PHOSPHORUS: Phosphorus: 5.7 mg/dL — ABNORMAL HIGH (ref 2.5–4.6)

## 2018-03-16 MED ORDER — FUROSEMIDE 10 MG/ML IJ SOLN
120.0000 mg | Freq: Once | INTRAVENOUS | Status: AC
  Administered 2018-03-16: 120 mg via INTRAVENOUS
  Filled 2018-03-16: qty 2

## 2018-03-16 MED ORDER — SODIUM ZIRCONIUM CYCLOSILICATE 5 G PO PACK
5.0000 g | PACK | Freq: Two times a day (BID) | ORAL | Status: AC
  Administered 2018-03-16 – 2018-03-17 (×2): 5 g via ORAL
  Filled 2018-03-16 (×2): qty 1

## 2018-03-16 NOTE — Progress Notes (Addendum)
eLink Physician-Brief Progress Note Patient Name: Yvonne Patel DOB: Sep 16, 1945 MRN: [id number]   Date of Service  03/16/2018  HPI/Events of Note  Pt has noted increased watery stools.   Pt remains intubated.  Hemodynamically stable.  eICU Interventions  Insert flexi-seal.  Check c diff.     Intervention Category Intermediate Interventions: Other:  Elsie Lincoln 03/16/2018, 10:00 PM   4:56 AM C diff negative, enteric precautions discontinued.

## 2018-03-16 NOTE — Progress Notes (Signed)
Yvonne Patel   SUBJECTIVE (INTERVAL HISTORY) Patient's  renal function is worsening with creatinine 5.1   She is neurologically awake but remains aphasic not following commands consistently. OBJECTIVE Temp:  [99 F (37.2 C)-100.8 F (38.2 C)] 100.2 F (37.9 C) (01/09 1200) Pulse Rate:  [74-87] 77 (01/09 1215) Cardiac Rhythm: Normal sinus rhythm (01/09 0800) Resp:  [23-28] 24 (01/09 1215) BP: (72-109)/(42-65) 72/42 (01/09 1215) SpO2:  [99 %-100 %] 100 % (01/09 1524) FiO2 (%):  [40 %] 40 % (01/09 1524) Weight:  [73.5 kg] 73.5 kg (01/09 0500)  Recent Labs  Lab 03/15/18 2317 03/16/18 0339 03/16/18 0813 03/16/18 1153 03/16/18 1540  GLUCAP 322* 187* 176* 221* 230*   Recent Labs  Lab 03/12/18 0453 03/12/18 1615  03/13/18 1531  03/14/18 0509  03/15/18 0130 03/15/18 0527 03/15/18 1000 03/16/18 0536 03/16/18 1446  NA 138  --    < > 138   < > 138   < > 137 135 134* 135 135  K 4.8  --    < > 4.9   < > 4.3   < > 4.6 4.7 4.8 5.5* 5.5*  CL 111  --    < > 109   < > 103   < > 103 101 102 100 100  CO2 15*  --    < > 14*   < > 21*   < > 22 21* 20* 20* 19*  GLUCOSE 272*  --    < > 370*   < > 181*   < > 154* 185* 256* 195* 246*  BUN 83*  --    < > 119*   < > 58*   < > 69* 73* 83* 113* 127*  CREATININE 5.26*  --    < > 6.09*   < > 2.91*   < > 3.35* 3.65* 3.83* 4.77* 5.18*  CALCIUM 7.0*  --    < > 6.4*   < > 7.3*   < > 8.0* 7.9* 7.8* 8.1* 7.9*  MG 2.3 2.4  --   --   --  2.4  --   --  2.5*  --  2.7*  --   PHOS 7.5* 8.2*  --  7.8*  --   --   --   --  5.3*  --  5.7*  --    < > = values in this interval not displayed.   Recent Labs  Lab 03/10/18 0503 03/14/18 0509 03/15/18 0527 03/16/18 0536  AST  --  960*  --  252*  ALT  --  1,072*  --  500*  ALKPHOS  --  679*  --  457*  BILITOT  --  1.1  --  1.0  PROT  --  5.7*  --  5.9*  ALBUMIN 2.1* 1.9* 1.7* 1.6*   Recent Labs  Lab 03/11/18 0535 03/12/18 0453 03/13/18 0513 03/14/18 0509 03/16/18 0536  WBC 12.1* 13.1* 13.0*  11.3* 15.6*  HGB 9.3* 8.9* 8.4* 8.6* 8.4*  HCT 28.9* 27.3* 26.3* 26.5* 25.9*  MCV 95.4 95.5 95.6 93.0 94.9  PLT 116* 106* 100* 110* 103*   Recent Labs  Lab 03/09/18 2224  TROPONINI 3.88*   No results for input(s): LABPROT, INR in the last 72 hours. No results for input(s): COLORURINE, LABSPEC, [place], GLUCOSEU, HGBUR, BILIRUBINUR, KETONESUR, PROTEINUR, UROBILINOGEN, NITRITE, LEUKOCYTESUR in the last 72 hours.  Invalid input(s): APPERANCEUR     Component Value Date/Time   CHOL 141 03/09/2018 0432   TRIG  120 03/09/2018 0432   HDL 25 (L) 03/09/2018 0432   CHOLHDL 5.6 03/09/2018 0432   VLDL 24 03/09/2018 0432   LDLCALC 92 03/09/2018 0432   Lab Results  Component Value Date   HGBA1C 7.1 (H) 03/09/2018   No results found for: LABOPIA, COCAINSCRNUR, LABBENZ, AMPHETMU, THCU, LABBARB  No results for input(s): ETH in the last 168 hours.  I have personally reviewed the radiological images below and agree with the radiology interpretations.  Ct Angio Head W Or Wo Contrast  Result Date: 03/11/2018 CLINICAL DATA:  Code stroke, deficit not specified EXAM: CT ANGIOGRAPHY HEAD AND NECK TECHNIQUE: Multidetector CT imaging of the head and neck was performed using the standard protocol during bolus administration of intravenous contrast. Multiplanar CT image reconstructions and MIPs were obtained to evaluate the vascular anatomy. Carotid stenosis measurements (when applicable) are obtained utilizing NASCET criteria, using the distal internal carotid diameter as the denominator. CONTRAST:  48mL ISOVUE-370 IOPAMIDOL (ISOVUE-370) INJECTION 76% COMPARISON:  None. FINDINGS: CTA NECK FINDINGS Aortic arch: Extensive atherosclerotic calcification. Three vessel branching Right carotid system: Diffuse mainly calcified atherosclerotic plaque which limits visualization of the lumen due to plaque blooming. There is distal common carotid stenosis that measures up to 70% (when compared to the more proximal vessel  given the immediate downstream bifurcation). There is ICA bulb plaque with 70% stenosis. Left carotid system: Much less extensive atherosclerotic plaque, question prior carotid endarterectomy. No stenosis or ulceration. Vertebral arteries: Severe bilateral proximal subclavian stenosis, with possible occlusion on the right. Narrowing on the left involves and extensive segment. No flow is seen within vertebral arteries until the V3 segments, there may be retrograde flow Skeleton: Diffuse degenerative disease.  No acute finding Other neck: Bilateral cataract resection. Upper chest: Hazy density of the lungs which could be from atelectasis. No Kerley lines to implicate edema. Review of the MIP images confirms the above findings CTA HEAD FINDINGS Anterior circulation: Confluent atherosclerotic calcification on the carotid siphons with small vessel size and plaque blooming limiting luminal visualization. There is a left M2 branch occlusion beginning at the M1 bifurcation with subsequent downstream reconstitution. No contralateral embolism is seen. Posterior circulation: Tiny vertebrobasilar arteries. There may be a basilar interruption from atherosclerosis or developmental variant. Fetal type flow to both posterior cerebral arteries which are symmetrically patent Venous sinuses: Patent as permitted by contrast timing Anatomic variants: As above Delayed phase: Not obtained in the emergent setting Review of the MIP images confirms the above findings Case discussed with Dr. Rory Percy at 5:04 p.m. IMPRESSION: 1. Left M2 branch occlusion beginning at the M1 bifurcation. 2. Severe atherosclerosis which preferentially spares the left carotid circulation, question prior endarterectomy. 3. Severe atheromatous narrowing if not occlusion of bilateral proximal subclavian arteries. There is bilateral fetal type PCA with diffuse thready flow in the posterior fossa. 4. 70% atheromatous narrowing of the right common carotid and ICA bulb.  Electronically Signed   By: Monte Fantasia M.D.   On: 03/31/2018 17:16   Dg Abd 1 View  Result Date: 03/12/2018 CLINICAL DATA:  Orogastric tube placement.  Stroke. EXAM: ABDOMEN - 1 VIEW COMPARISON:  None. FINDINGS: Gastric decompression catheter extends into the stomach with the tip likely in the distal stomach near the antrum. No evidence of overt bowel obstruction or ileus. IMPRESSION: Orogastric tube extends into the stomach with the tip located in the distal stomach, likely near the antrum. Electronically Signed   By: Aletta Edouard M.D.   On: 03/12/2018 08:43   Ct Head  Wo Contrast  Result Date: 03/11/2018 CLINICAL DATA:  Follow-up examination for acute stroke. EXAM: CT HEAD WITHOUT CONTRAST TECHNIQUE: Contiguous axial images were obtained from the base of the skull through the vertex without intravenous contrast. COMPARISON:  Comparison made with prior MRI from 03/09/2018 as well as CT from 04/05/2018. FINDINGS: Brain: There has been continued interval evolution of small to moderate sized left MCA territory infarct, overall stable in distribution as compared to previous MRI. Mild localized edema without significant mass effect. Additional involving subcentimeter left cerebellar infarct noted as well. No evidence for hemorrhagic transformation or other complication. Previously noted additional punctate subcentimeter infarcts not well seen by CT. Subcentimeter hyperdensity involving the right frontal cortex slightly increased in size on today's exam measuring 5 mm, consistent with previously identified small hemorrhage. No significant mass effect or edema. No other evidence for new or interval infarction. No other acute large vessel territory infarct. Underlying atrophy with chronic microvascular ischemic disease noted, stable. Vascular: No hyperdense vessel. Calcified atherosclerosis noted at the skull base. Skull: Scalp soft tissues and calvarium within normal limits. Sinuses/Orbits: Globes and orbital  soft tissues demonstrate no acute finding. Paranasal sinuses and mastoid air cells remain clear. Other: None. IMPRESSION: 1. Normal expected interval evolution of small to moderate sized left MCA territory infarcts. No evidence for hemorrhagic transformation or other complication. Additional previously identified subcentimeter acute ischemic infarcts not well visualized by CT. 2. Slight interval increase in size of cortically based right frontal parenchymal hemorrhage, now measuring 5 mm (previously 3 mm). 3. No other new acute intracranial abnormality. Electronically Signed   By: Jeannine Boga M.D.   On: 03/11/2018 03:41   Ct Head Wo Contrast  Result Date: 03/29/2018 CLINICAL DATA:  Post thrombectomy EXAM: CT HEAD WITHOUT CONTRAST TECHNIQUE: Contiguous axial images were obtained from the base of the skull through the vertex without intravenous contrast. COMPARISON:  Head CT from earlier today FINDINGS: Brain: 3 mm focus of high density along the high and posterior right frontal convexity, on reformats this appears to be intraparenchymal. No hemorrhagic conversion or visible infarct in the area of procedure. Multiple remote small vessel infarcts. Vascular: Major vessels are symmetrically enhancing. Skull: Negative Sinuses/Orbits: Negative These results were called by telephone at the time of interpretation on 03/11/2018 at 8:17 pm to Dr. Cheral Marker , who verbally acknowledged these results. IMPRESSION: New 3 mm high-density within the posterior right frontal cortex which could be focus of hemorrhage or an enhancing subacute infarct. No hemorrhage or infarct seen along the postoperative left MCA distribution. Electronically Signed   By: Monte Fantasia M.D.   On: 03/22/2018 20:18   Ct Angio Neck W Or Wo Contrast  Result Date: 03/16/2018 CLINICAL DATA:  Code stroke, deficit not specified EXAM: CT ANGIOGRAPHY HEAD AND NECK TECHNIQUE: Multidetector CT imaging of the head and neck was performed using the standard  protocol during bolus administration of intravenous contrast. Multiplanar CT image reconstructions and MIPs were obtained to evaluate the vascular anatomy. Carotid stenosis measurements (when applicable) are obtained utilizing NASCET criteria, using the distal internal carotid diameter as the denominator. CONTRAST:  44mL ISOVUE-370 IOPAMIDOL (ISOVUE-370) INJECTION 76% COMPARISON:  None. FINDINGS: CTA NECK FINDINGS Aortic arch: Extensive atherosclerotic calcification. Three vessel branching Right carotid system: Diffuse mainly calcified atherosclerotic plaque which limits visualization of the lumen due to plaque blooming. There is distal common carotid stenosis that measures up to 70% (when compared to the more proximal vessel given the immediate downstream bifurcation). There is ICA bulb plaque with  70% stenosis. Left carotid system: Much less extensive atherosclerotic plaque, question prior carotid endarterectomy. No stenosis or ulceration. Vertebral arteries: Severe bilateral proximal subclavian stenosis, with possible occlusion on the right. Narrowing on the left involves and extensive segment. No flow is seen within vertebral arteries until the V3 segments, there may be retrograde flow Skeleton: Diffuse degenerative disease.  No acute finding Other neck: Bilateral cataract resection. Upper chest: Hazy density of the lungs which could be from atelectasis. No Kerley lines to implicate edema. Review of the MIP images confirms the above findings CTA HEAD FINDINGS Anterior circulation: Confluent atherosclerotic calcification on the carotid siphons with small vessel size and plaque blooming limiting luminal visualization. There is a left M2 branch occlusion beginning at the M1 bifurcation with subsequent downstream reconstitution. No contralateral embolism is seen. Posterior circulation: Tiny vertebrobasilar arteries. There may be a basilar interruption from atherosclerosis or developmental variant. Fetal type flow to  both posterior cerebral arteries which are symmetrically patent Venous sinuses: Patent as permitted by contrast timing Anatomic variants: As above Delayed phase: Not obtained in the emergent setting Review of the MIP images confirms the above findings Case discussed with Dr. Rory Percy at 5:04 p.m. IMPRESSION: 1. Left M2 branch occlusion beginning at the M1 bifurcation. 2. Severe atherosclerosis which preferentially spares the left carotid circulation, question prior endarterectomy. 3. Severe atheromatous narrowing if not occlusion of bilateral proximal subclavian arteries. There is bilateral fetal type PCA with diffuse thready flow in the posterior fossa. 4. 70% atheromatous narrowing of the right common carotid and ICA bulb. Electronically Signed   By: Monte Fantasia M.D.   On: 04/01/2018 17:16   Mr Jodene Nam Head Wo Contrast  Result Date: 03/09/2018 CLINICAL DATA:  Stroke follow-up. Status post endovascular revascularization of left MCA occlusion yesterday. EXAM: MRI HEAD WITHOUT CONTRAST MRA HEAD WITHOUT CONTRAST TECHNIQUE: Multiplanar, multiecho pulse sequences of the brain and surrounding structures were obtained without intravenous contrast. Angiographic images of the head were obtained using MRA technique without contrast. COMPARISON:  Head CT and CTA 03/10/2018. Brain MRI 07/20/2016. Head MRA 09/30/2011. FINDINGS: MRI HEAD FINDINGS Brain: There is a small to slightly moderate-sized acute left MCA infarct involving the insula, predominantly posterior frontal lobe as well as operculum, and small amount of the postcentral gyrus. There also punctate acute infarcts involving the left caudate nucleus, right centrum semiovale, parasagittal right parietal cortex, and likely left occipital white matter. There are also small acute infarcts in the left greater than right cerebellar hemispheres. Susceptibility artifact in the right precentral gyrus corresponds to the 4 mm acute hemorrhage on yesterday's CT with minimal  surrounding edema. There are numerous chronic microhemorrhages clustered about the right insula and operculum which are new from the 2018 MRI. Multiple additional chronic microhemorrhages are present in the basal ganglia bilaterally and scattered throughout both cerebral hemispheres and cerebellum. Patchy T2 hyperintensities in the cerebral white matter and pons have progressed from the prior MRI and are nonspecific but compatible with moderate chronic small-vessel ischemic disease. There are new chronic lacunar infarcts in the cerebral white matter, and there are chronic lacunar infarcts in the basal ganglia bilaterally. Small chronic infarcts in the cerebellar hemispheres have increased in number from the prior MRI. Mild cerebral atrophy is within normal limits for age. No mass, midline shift, or extra-axial fluid collection is identified. Vascular: Diminutive vertebrobasilar circulation, more fully evaluated below. Skull and upper cervical spine: Unremarkable bone marrow signal. Sinuses/Orbits: Bilateral cataract extraction. Paranasal sinuses and mastoid air cells are clear. Other: Partially  visualized endotracheal and enteric tubes. MRA HEAD FINDINGS The distal vertebral arteries are diminutive with diminished flow most notable in the distal right V3 and V4 segments and with bilateral proximal vertebral artery occlusion shown on yesterday's neck CTA. Patent bilateral PICA, right AICA, and bilateral SCA origins are identified. There is a chronic lack of visualization of the mid basilar artery which appears patent more proximally and distally, and this may reflect segmental atherosclerotic occlusion or a developmental variant. There are large posterior communicating arteries bilaterally with absence or severe hypoplasia of the right P1 segment. Both PCAs are patent without evidence of significant proximal stenosis. The internal carotid arteries are patent from skull base to carotid termini with extensive bilateral  cavernous segment atherosclerotic irregularity resulting in mild stenosis on the right and moderate stenosis on the left. 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 ACAs and MCAs are patent with branch vessel irregularity but no evidence of proximal branch occlusion. The left MCA trifurcation remains patent following thrombectomy although there is moderate stenosis of the inferior and mid M2 origins. There also mild proximal left M1 and left A1 stenoses. IMPRESSION: 1. Small to moderate-sized acute left MCA infarct. 2. Additional predominantly subcentimeter acute infarcts in the cerebral and cerebellar hemispheres bilaterally. 3. 4 mm acute hemorrhage in the right precentral gyrus as seen on CT yesterday. 4. Progressive chronic small vessel ischemia since 2018 with increased number of chronic microhemorrhages and chronic lacunar infarcts. 5. Advanced intracranial atherosclerosis. Revascularized left MCA bifurcation remains patent with moderate M2 origin stenoses. 6. Moderate ICA stenoses. 7. Diminished flow in the distal vertebral arteries with bilateral proximal occlusion shown on prior CTA. 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 8. 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 Electronically Signed   By: Logan Bores M.D.   On: 03/09/2018 17:17   Mr Brain Wo Contrast  Result Date: 03/09/2018 CLINICAL DATA:  Stroke follow-up. Status post endovascular revascularization of left MCA occlusion yesterday. EXAM: MRI HEAD WITHOUT CONTRAST MRA HEAD WITHOUT CONTRAST TECHNIQUE: Multiplanar, multiecho pulse sequences of the brain and surrounding structures were obtained without intravenous contrast. Angiographic images of the head were obtained using MRA technique without  contrast. COMPARISON:  Head CT and CTA 03/27/2018. Brain MRI 07/20/2016. Head MRA 09/30/2011. FINDINGS: MRI HEAD FINDINGS Brain: There is a small to slightly moderate-sized acute left MCA infarct involving the insula, predominantly posterior frontal lobe as well as operculum, and small amount of the postcentral gyrus. There also punctate acute infarcts involving the left caudate nucleus, right centrum semiovale, parasagittal right parietal cortex, and likely left occipital white matter. There are also small acute infarcts in the left greater than right cerebellar hemispheres. Susceptibility artifact in the right precentral gyrus corresponds to the 4 mm acute hemorrhage on yesterday's CT with minimal surrounding edema. There are numerous chronic microhemorrhages clustered about the right insula and operculum which are new from the 2018 MRI. Multiple additional chronic microhemorrhages are present in the basal ganglia bilaterally and scattered throughout both cerebral hemispheres and cerebellum. Patchy T2 hyperintensities in the cerebral white matter and pons have progressed from the prior MRI and are nonspecific but compatible with moderate chronic small-vessel ischemic disease. There are new chronic lacunar infarcts in the cerebral white matter, and there are chronic lacunar infarcts  in the basal ganglia bilaterally. Small chronic infarcts in the cerebellar hemispheres have increased in number from the prior MRI. Mild cerebral atrophy is within normal limits for age. No mass, midline shift, or extra-axial fluid collection is identified. Vascular: Diminutive vertebrobasilar circulation, more fully evaluated below. Skull and upper cervical spine: Unremarkable bone marrow signal. Sinuses/Orbits: Bilateral cataract extraction. Paranasal sinuses and mastoid air cells are clear. Other: Partially visualized endotracheal and enteric tubes. MRA HEAD FINDINGS The distal vertebral arteries are diminutive with diminished flow  most notable in the distal right V3 and V4 segments and with bilateral proximal vertebral artery occlusion shown on yesterday's neck CTA. Patent bilateral PICA, right AICA, and bilateral SCA origins are identified. There is a chronic lack of visualization of the mid basilar artery which appears patent more proximally and distally, and this may reflect segmental atherosclerotic occlusion or a developmental variant. There are large posterior communicating arteries bilaterally with absence or severe hypoplasia of the right P1 segment. Both PCAs are patent without evidence of significant proximal stenosis. The internal carotid arteries are patent from skull base to carotid termini with extensive bilateral cavernous segment atherosclerotic irregularity resulting in mild stenosis on the right and moderate stenosis on the left. 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 ACAs and MCAs are patent with branch vessel irregularity but no evidence of proximal branch occlusion. The left MCA trifurcation remains patent following thrombectomy although there is moderate stenosis of the inferior and mid M2 origins. There also mild proximal left M1 and left A1 stenoses. IMPRESSION: 1. Small to moderate-sized acute left MCA infarct. 2. Additional predominantly subcentimeter acute infarcts in the cerebral and cerebellar hemispheres bilaterally. 3. 4 mm acute hemorrhage in the right precentral gyrus as seen on CT yesterday. 4. Progressive chronic small vessel ischemia since 2018 with increased number of chronic microhemorrhages and chronic lacunar infarcts. 5. Advanced intracranial atherosclerosis. Revascularized left MCA bifurcation remains patent with moderate M2 origin stenoses. 6. Moderate ICA stenoses. 7. Diminished flow in the distal vertebral arteries with bilateral proximal occlusion shown on prior  CTA. 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 8. 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 Electronically Signed   By: Logan Bores M.D.   On: 03/09/2018 17:17   Ir Angiogram Extremity Bilateral  Result Date: 03/09/2018 INDICATION: 73 year old female with a history of acute left MCA stroke. She presents within time frame for IV tPA, and is a candidate for mechanical thrombectomy EXAM: ULTRASOUND GUIDED ACCESS RIGHT COMMON FEMORAL ARTERY ULTRASOUND GUIDED ACCESS LEFT COMMON FEMORAL ARTERY FOR ARTERIAL MONITORING ANGIOGRAM RIGHT LOWER EXTREMITY BALLOON ANGIOPLASTY STENOSIS RIGHT COMMON ILIAC ARTERY IN ORDER TO PASS 8 FRENCH SHEATH TO COMPLETE THE THROMBECTOMY CEREBRAL ANGIOGRAM MECHANICAL THROMBECTOMY LEFT MCA EMERGENT LARGE VESSEL OCCLUSION DEPLOYMENT OF ANGIO-SEAL RIGHT COMMON FEMORAL ARTERY COMPARISON:  CT imaging of the same day MEDICATIONS: 2 g Ancef IV. The antibiotic was administered within 1 hour of the procedure ANESTHESIA/SEDATION: General endotracheal tube anesthesia CONTRAST:  96 cc Isovue-300 FLUOROSCOPY TIME:  Fluoroscopy Time: 26 minutes 24 seconds (1,362 mGy). COMPLICATIONS: None TECHNIQUE: Informed written consent was obtained from the patient's family after a thorough discussion of the procedural risks, benefits and alternatives. Specific risks discussed include: Bleeding, infection, contrast reaction, kidney injury/failure, need for further procedure/surgery, arterial injury or dissection,  embolization to new territory, intracranial hemorrhage (10-15% risk), neurologic deterioration, cardiopulmonary collapse, death. All questions were addressed. Maximal Sterile Barrier Technique was utilized including during the procedure including caps, mask, sterile gowns, sterile gloves, sterile drape, hand hygiene and skin antiseptic. A timeout was performed prior to the initiation of  the procedure. The anesthesia team was present to provide general endotracheal tube anesthesia and for patient monitoring during the procedure. Interventional neuro radiology nursing staff was also present. 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 Surgical changes of prior endarterectomy evident on prior CT imaging. Left external carotid artery: Patent with antegrade flow. Left internal carotid artery: Normal course caliber and contour of the cervical portion. Vertical and petrous segment patent with normal course caliber contour. Cavernous segment patent. Clinoid segment patent. Antegrade flow of the ophthalmic artery. Ophthalmic segment patent. Terminus patent. Left MCA: Distal M1 occluded at the initiation of the case. There is partial filling of an inferior branch which is the non dominant branch to the frontal. Patent fetal PCA to the left P2 segment. Left ACA: A 1 segment patent. A 2 segment perfuses the right territory. Completion Findings: Left MCA: After 2 passes of mechanical thrombectomy plus aspiration, there is near complete restoration of flow through the left MCA territory, with slow flow through an M3/M4 branch of the parietal region compatible with TI CI 2b flow. 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 Physical exam At the initiation of the case, palpable bilateral common femoral artery pulses present No palpable distal pulses Doppler positive pulses of the right posterior tibial and anterior tibial (with systolic blood pressures above 200) Doppler positive pulse at the left posterior tibial (with systolic blood pressure above 200), with no Doppler signal at the left anterior tibial At completion of case Doppler positive pulses were only discovered on the right posterior tibial and anterior tibial arteries with blood pressures above 200.  In the 140-160 range (goal) Doppler signal was not evident A completion of case Doppler positive pulses were only discovered on the left at the posterior tibial and not the left anterior tibial (with the pressure above 497 systolic). PROCEDURE: Patient is brought emergently to the neuro angiography suite, with the patient identified appropriately and placed supine position on the table. Left radial arterial line was attempted by the anesthesia team. The patient is then prepped and draped in the usual sterile fashion. Ultrasound survey of the right inguinal region was performed with images stored and sent to PACs. 11 blade scalpel was used to make a small incision. Blunt dissection was performed. A micropuncture needle was used access the right common femoral artery under ultrasound. With excellent arterial blood flow returned, an .018 micro wire was passed through the needle, observed to enter the abdominal aorta under fluoroscopy. The needle was removed, and a micropuncture sheath was placed over the wire. The inner dilator and wire were removed, and an 035 Bentson wire was advanced under fluoroscopy into the abdominal aorta. The sheath was removed and a standard 5 [place] vascular sheath was placed. The dilator was removed and the sheath was flushed. Ultrasound survey of the left inguinal region was then performed with images stored and sent to PACs, confirming patency of the vessel. A micropuncture needle was used access the left common femoral artery under ultrasound. With excellent arterial blood flow returned, and an .018 micro wire was passed through the needle, observed  enter the abdominal aorta under fluoroscopy. The needle was removed, and a micropuncture sheath was placed over the wire. The inner dilator and wire were removed, and an 035 Bentson wire was advanced under fluoroscopy into the abdominal aorta. The sheath was removed and a standard 4 [place] vascular sheath was placed for arterial monitoring.  The dilator was removed and the sheath was flushed. A 70F JB-1 diagnostic catheter was then advanced over the wire through the existing right femoral access to the proximal descending thoracic aorta. Wire was then removed. Double flush of the catheter was performed. Catheter was then used to select the left cervical internal carotid artery. Formal angiogram was performed, with roadmap achieved. Exchange length Rosen wire was then passed through the diagnostic catheter to the distal cervical ICA and the diagnostic catheter was removed. The 5 French sheath was removed, with attempt at placing 8 French 55 cm bright tip sheath. Significant resistance was encountered with passing the sheath through the right iliac system. The sheath would not advanced through the pre-existing right common iliac artery stent. Sheath was withdrawn and rotated. We attempted to advance the sheath over the introducer which was pinned on the wire. We also removed the introducer in place to diagnostic catheter into the aorta and attempted to pass the sheath over the diagnostic catheter. None of these maneuvers were successful. Sheath was then withdrawn into the external iliac artery. Balloon angioplasty was performed of the pre-existing right common iliac artery stent with 5 mm x 40 mm standard balloon angioplasty. Balloon was removed and the introducer was then replaced. Eight French sheath was then again attempted to pass over the wire, unsuccessful through this segment of the iliac artery. The introducer was removed, diagnostic catheter was placed into the cervical segment of the internal carotid artery, and the Rose an wire was exchanged for a 260 cm Amplatz wire. With the stiff wire in place, another attempt was made to pass the sheath which was unsuccessful. Sheath was then again withdrawn to the external iliac artery, introducer was removed, and a second balloon angioplasty 6 mm x 40 mm was performed at the pre-existing stent. The sheath  was then successfully advanced over the balloon as the balloon was deflated, into the abdominal aorta. The introducer was again placed after removing the balloon and the sheath was advanced 6 successfully into the aorta. Introducer was withdrawn. Once the sheath was advanced over the wire to the thoracic aorta, sheath was flushed and attached to pressurized and heparinized saline bag for constant forward flow. Eight French 95 cm flow gate balloon catheter was then advanced over the wire to the distal cervical segment. Wire was removed. Then a coaxial intermediate catheter and microcatheter combination was prepared on the back table. This combination was penumbra Ace 64 catheter and a Trevo Provue18 microcatheter, with a synchro soft wire. This combination was then advanced through the balloon guide into the ICA. System was advanced into the internal carotid artery, to the level of the occlusion. The micro wire was then carefully advanced through the occluded segment, with a distal knuckle configuration. Microcatheter was then pushed through the occluded segment and the wire was removed. Intermediate catheter was advanced over the micro wire to the carotid siphon, which would not advance beyond the ophthalmic segment through the terminus. Blood was then aspirated through the hub of the microcatheter, and a gentle contrast injection was performed confirming intraluminal position. A rotating hemostatic valve was then attached to the back end of the microcatheter,  and a pressurized and heparinized saline bag was attached to the catheter. 4 mm x 40 mm solitaire device was then selected. Back flush was achieved at the rotating hemostatic valve, and then the device was gently advanced through the microcatheter to the distal end. The retriever was then unsheathed by withdrawing the microcatheter under fluoroscopy. Once the retriever was completely unsheathed, the microcatheter was carefully stripped from the delivery wire  of the device. Control angiogram was then performed from the balloon catheter. 3 minute time interval was observed. The balloon on the balloon guide was then inflated to profile of the vessel. Constant aspiration was then performed through the intermediate catheter, and constant gentle aspiration was performed at the balloon guide. This aspiration was continued as the retriever was gently and slowly withdrawn with fluoroscopic observation into the distal intermediate catheter. The entire system was then gently withdrawn from the intracranial ICA and into the balloon guide. Once the retriever was entirely removed from the system, free aspiration was confirmed at the hub of the balloon guide, with free blood return confirmed. Control angiogram was then performed. TICI 2a flow was achieved. Repeat angiogram with multiple angulation demonstrated persistent occlusion of parietal branch. The same coaxial system was again passed through the balloon guide catheter. The microcatheter system was then advanced through the occlusion of the parietal branch. Once the micro wire microcatheter were beyond the occlusion, the micro wire was removed and stentreiver device was deployed, stripping the microcatheter from the wire. After the device was deployed across the occlusion, the intermediate catheter was again attempted to advanced to the M1 segment. This was unsuccessful, and would not advance beyond the ophthalmic segment. The balloon at the balloon guide was inflated to profile of the vessel. Local aspiration was performed at the intermediate catheter upon withdrawal of the device under fluoroscopic observation, with aspiration at the balloon guide. Once the retrieve her and intermediate catheter were entirely removed from the system, free aspiration was confirmed at the hub of the intermediate catheter, with free blood return confirmed. Control angiogram was again performed. Improved flow was confirmed, TICI 2b. Balloon guide  was withdrawn and angiogram of the cervical segment was performed. We then removed the balloon guide catheter and deployed an 8 French Angio-Seal at the right common femoral artery access. Angiogram was performed of the right sided access and the left-sided access before completing the case. Patient tolerated the procedure well and remained hemodynamically stable throughout. No complications were encountered. Estimated blood loss approximately 50 cc. IMPRESSION: Status post ultrasound guided access right common femoral artery for cerebral angiogram and a combination of aspiration and mechanical thrombectomy of left M1 emergent large vessel occlusion, and restoration of TICI 2b flow after 2 passes. Status post balloon angioplasty to 6 mm of proximal right common iliac artery for treatment of a restrictive lesion in order to pass 8 French sheath to perform the thrombectomy. Status post deployment of Angio-Seal at the right common femoral artery. At the conclusion of the case, a 4 [place] sheath remains in left common femoral artery for arterial monitoring. Signed, Dulcy Fanny. Dellia Nims, RPVI [facility] Radiology PLAN: Formal CT after the case Right hip straight overnight status post 8 French device and Angio-Seal closure Left common femoral artery for French sheath may be removed when hemodynamic monitoring is no longer needed with manual pressure. Goal blood pressure 970 [id number] systolic given the incomplete flow restoration Electronically Signed   By: Corrie Mckusick D.O.  On: 03/25/2018 20:39   Ir US Guide Vasc Access Right  Result Date: 03/13/2018 INDICATION: 73 year old female with a history of acute left MCA stroke. She presents within time frame for IV tPA, and is a candidate for mechanical thrombectomy EXAM: ULTRASOUND GUIDED ACCESS RIGHT COMMON FEMORAL ARTERY ULTRASOUND GUIDED ACCESS LEFT COMMON FEMORAL ARTERY FOR ARTERIAL MONITORING ANGIOGRAM RIGHT LOWER  EXTREMITY BALLOON ANGIOPLASTY STENOSIS RIGHT COMMON ILIAC ARTERY IN ORDER TO PASS 8 FRENCH SHEATH TO COMPLETE THE THROMBECTOMY CEREBRAL ANGIOGRAM MECHANICAL THROMBECTOMY LEFT MCA EMERGENT LARGE VESSEL OCCLUSION DEPLOYMENT OF ANGIO-SEAL RIGHT COMMON FEMORAL ARTERY COMPARISON:  CT imaging of the same day MEDICATIONS: 2 g Ancef IV. The antibiotic was administered within 1 hour of the procedure ANESTHESIA/SEDATION: General endotracheal tube anesthesia CONTRAST:  96 cc [id number]-[id number] FLUOROSCOPY TIME:  Fluoroscopy Time: 26 minutes 24 seconds (1,362 mGy). COMPLICATIONS: None TECHNIQUE: Informed written consent was obtained from the patient's family after a thorough discussion of the procedural risks, benefits and alternatives. Specific risks discussed include: Bleeding, infection, contrast reaction, kidney injury/failure, need for further procedure/surgery, arterial injury or dissection, embolization to new territory, intracranial hemorrhage (10-15% risk), neurologic deterioration, cardiopulmonary collapse, death. All questions were addressed. Maximal Sterile Barrier Technique was utilized including during the procedure including caps, mask, sterile gowns, sterile gloves, sterile drape, hand hygiene and skin antiseptic. A timeout was performed prior to the initiation of the procedure. The anesthesia team was present to provide general endotracheal tube anesthesia and for patient monitoring during the procedure. Interventional neuro radiology nursing staff was also present. 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 Surgical changes of prior endarterectomy evident on prior CT imaging. Left external carotid artery: Patent with antegrade flow. Left internal carotid artery: Normal course caliber and contour of the cervical portion. Vertical and petrous segment patent with normal course caliber contour. Cavernous  segment patent. Clinoid segment patent. Antegrade flow of the ophthalmic artery. Ophthalmic segment patent. Terminus patent. Left MCA: Distal M1 occluded at the initiation of the case. There is partial filling of an inferior branch which is the non dominant branch to the frontal. Patent fetal PCA to the left P2 segment. Left ACA: A 1 segment patent. A 2 segment perfuses the right territory. Completion Findings: Left MCA: After 2 passes of mechanical thrombectomy plus aspiration, there is near complete restoration of flow through the left MCA territory, with slow flow through an M3/M4 branch of the parietal region compatible with TI CI 2b flow. 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 Physical exam At the initiation of the case, palpable bilateral common femoral artery pulses present No palpable distal pulses Doppler positive pulses of the right posterior tibial and anterior tibial (with systolic blood pressures above 200) Doppler positive pulse at the left posterior tibial (with systolic blood pressure above 200), with no Doppler signal at the left anterior tibial At completion of case Doppler positive pulses were only discovered on the right posterior tibial and anterior tibial arteries with blood pressures above 200. In the 140-160 range (goal) Doppler signal was not evident A completion of case Doppler positive pulses were only discovered on the left at the posterior tibial and not the left anterior tibial (with the pressure above 270 systolic). PROCEDURE: Patient is brought emergently to the neuro angiography suite, with the patient identified appropriately and placed supine position on the table. Left radial arterial line was attempted by the anesthesia team. The patient  is then prepped and draped in the usual sterile fashion. Ultrasound survey of the right inguinal region was performed with images stored and sent to PACs. 11 blade scalpel was used to make a small  incision. Blunt dissection was performed. A micropuncture needle was used access the right common femoral artery under ultrasound. With excellent arterial blood flow returned, an .018 micro wire was passed through the needle, observed to enter the abdominal aorta under fluoroscopy. The needle was removed, and a micropuncture sheath was placed over the wire. The inner dilator and wire were removed, and an 035 Bentson wire was advanced under fluoroscopy into the abdominal aorta. The sheath was removed and a standard 5 [place] vascular sheath was placed. The dilator was removed and the sheath was flushed. Ultrasound survey of the left inguinal region was then performed with images stored and sent to PACs, confirming patency of the vessel. A micropuncture needle was used access the left common femoral artery under ultrasound. With excellent arterial blood flow returned, and an .018 micro wire was passed through the needle, observed enter the abdominal aorta under fluoroscopy. The needle was removed, and a micropuncture sheath was placed over the wire. The inner dilator and wire were removed, and an 035 Bentson wire was advanced under fluoroscopy into the abdominal aorta. The sheath was removed and a standard 4 [place] vascular sheath was placed for arterial monitoring. The dilator was removed and the sheath was flushed. A 67F JB-1 diagnostic catheter was then advanced over the wire through the existing right femoral access to the proximal descending thoracic aorta. Wire was then removed. Double flush of the catheter was performed. Catheter was then used to select the left cervical internal carotid artery. Formal angiogram was performed, with roadmap achieved. Exchange length Rosen wire was then passed through the diagnostic catheter to the distal cervical ICA and the diagnostic catheter was removed. The 5 French sheath was removed, with attempt at placing 8 French 55 cm bright tip sheath. Significant resistance was  encountered with passing the sheath through the right iliac system. The sheath would not advanced through the pre-existing right common iliac artery stent. Sheath was withdrawn and rotated. We attempted to advance the sheath over the introducer which was pinned on the wire. We also removed the introducer in place to diagnostic catheter into the aorta and attempted to pass the sheath over the diagnostic catheter. None of these maneuvers were successful. Sheath was then withdrawn into the external iliac artery. Balloon angioplasty was performed of the pre-existing right common iliac artery stent with 5 mm x 40 mm standard balloon angioplasty. Balloon was removed and the introducer was then replaced. Eight French sheath was then again attempted to pass over the wire, unsuccessful through this segment of the iliac artery. The introducer was removed, diagnostic catheter was placed into the cervical segment of the internal carotid artery, and the Rose an wire was exchanged for a 260 cm Amplatz wire. With the stiff wire in place, another attempt was made to pass the sheath which was unsuccessful. Sheath was then again withdrawn to the external iliac artery, introducer was removed, and a second balloon angioplasty 6 mm x 40 mm was performed at the pre-existing stent. The sheath was then successfully advanced over the balloon as the balloon was deflated, into the abdominal aorta. The introducer was again placed after removing the balloon and the sheath was advanced 6 successfully into the aorta. Introducer was withdrawn. Once the sheath was advanced over the wire to  the thoracic aorta, sheath was flushed and attached to pressurized and heparinized saline bag for constant forward flow. Eight French 95 cm flow gate balloon catheter was then advanced over the wire to the distal cervical segment. Wire was removed. Then a coaxial intermediate catheter and microcatheter combination was prepared on the back table. This combination  was penumbra Ace 64 catheter and a Trevo Provue18 microcatheter, with a synchro soft wire. This combination was then advanced through the balloon guide into the ICA. System was advanced into the internal carotid artery, to the level of the occlusion. The micro wire was then carefully advanced through the occluded segment, with a distal knuckle configuration. Microcatheter was then pushed through the occluded segment and the wire was removed. Intermediate catheter was advanced over the micro wire to the carotid siphon, which would not advance beyond the ophthalmic segment through the terminus. Blood was then aspirated through the hub of the microcatheter, and a gentle contrast injection was performed confirming intraluminal position. A rotating hemostatic valve was then attached to the back end of the microcatheter, and a pressurized and heparinized saline bag was attached to the catheter. 4 mm x 40 mm solitaire device was then selected. Back flush was achieved at the rotating hemostatic valve, and then the device was gently advanced through the microcatheter to the distal end. The retriever was then unsheathed by withdrawing the microcatheter under fluoroscopy. Once the retriever was completely unsheathed, the microcatheter was carefully stripped from the delivery wire of the device. Control angiogram was then performed from the balloon catheter. 3 minute time interval was observed. The balloon on the balloon guide was then inflated to profile of the vessel. Constant aspiration was then performed through the intermediate catheter, and constant gentle aspiration was performed at the balloon guide. This aspiration was continued as the retriever was gently and slowly withdrawn with fluoroscopic observation into the distal intermediate catheter. The entire system was then gently withdrawn from the intracranial ICA and into the balloon guide. Once the retriever was entirely removed from the system, free aspiration was  confirmed at the hub of the balloon guide, with free blood return confirmed. Control angiogram was then performed. TICI 2a flow was achieved. Repeat angiogram with multiple angulation demonstrated persistent occlusion of parietal branch. The same coaxial system was again passed through the balloon guide catheter. The microcatheter system was then advanced through the occlusion of the parietal branch. Once the micro wire microcatheter were beyond the occlusion, the micro wire was removed and stentreiver device was deployed, stripping the microcatheter from the wire. After the device was deployed across the occlusion, the intermediate catheter was again attempted to advanced to the M1 segment. This was unsuccessful, and would not advance beyond the ophthalmic segment. The balloon at the balloon guide was inflated to profile of the vessel. Local aspiration was performed at the intermediate catheter upon withdrawal of the device under fluoroscopic observation, with aspiration at the balloon guide. Once the retrieve her and intermediate catheter were entirely removed from the system, free aspiration was confirmed at the hub of the intermediate catheter, with free blood return confirmed. Control angiogram was again performed. Improved flow was confirmed, TICI 2b. Balloon guide was withdrawn and angiogram of the cervical segment was performed. We then removed the balloon guide catheter and deployed an 8 French Angio-Seal at the right common femoral artery access. Angiogram was performed of the right sided access and the left-sided access before completing the case. Patient tolerated the procedure well and remained  hemodynamically stable throughout. No complications were encountered. Estimated blood loss approximately 50 cc. IMPRESSION: Status post ultrasound guided access right common femoral artery for cerebral angiogram and a combination of aspiration and mechanical thrombectomy of left M1 emergent large vessel  occlusion, and restoration of TICI 2b flow after 2 passes. Status post balloon angioplasty to 6 mm of proximal right common iliac artery for treatment of a restrictive lesion in order to pass 8 French sheath to perform the thrombectomy. Status post deployment of Angio-Seal at the right common femoral artery. At the conclusion of the case, a 4 [place] sheath remains in left common femoral artery for arterial monitoring. Signed, Dulcy Fanny. Dellia Nims, RPVI [facility] Radiology PLAN: Formal CT after the case Right hip straight overnight status post 8 French device and Angio-Seal closure Left common femoral artery for French sheath may be removed when hemodynamic monitoring is no longer needed with manual pressure. Goal blood pressure 761 [id number] systolic given the incomplete flow restoration Electronically Signed   By: Corrie Mckusick D.O.   On: 04/03/2018 20:39   Dg Chest Port 1 View  Result Date: 03/16/2018 CLINICAL DATA:  Acute respiratory failure EXAM: PORTABLE CHEST 1 VIEW COMPARISON:  03/13/2018 FINDINGS: Endotracheal tube is 1.5 cm above the carina. Right dialysis catheter and NG tube are unchanged. Cardiomegaly with vascular congestion and hazy perihilar and lower lobe airspace opacities, favor edema. No real change since prior study. IMPRESSION: No significant change. Electronically Signed   By: Rolm Baptise M.D.   On: 03/16/2018 11:36   Dg Chest Port 1 View  Result Date: 03/13/2018 CLINICAL DATA:  Central line placement EXAM: PORTABLE CHEST 1 VIEW COMPARISON:  Chest radiograph from earlier today. FINDINGS: Endotracheal tube tip is 1.8 cm above the carina. Enteric tube enters stomach with the tip not seen on this image. Right internal jugular central venous catheter terminates in the middle third of the SVC. Stable cardiomediastinal silhouette with mild cardiomegaly. No pneumothorax. No pleural effusion. Hazy parahilar lung opacities are stable.  IMPRESSION: 1. Right internal jugular central venous catheter terminates in the middle third of the SVC. No pneumothorax. 2. Endotracheal tube tip 1.8 cm above the carina. 3. Stable mild cardiomegaly. Stable hazy parahilar lung opacities, favor pulmonary edema. Electronically Signed   By: Ilona Sorrel M.D.   On: 03/13/2018 15:14   Dg Chest Port 1 View  Result Date: 03/13/2018 CLINICAL DATA:  Endotracheal tube EXAM: PORTABLE CHEST 1 VIEW COMPARISON:  03/12/2018 FINDINGS: The heart is moderately enlarged. NG tube is stable. Endotracheal tube has been retracted and the tip is now 3.2 cm from the carina. There is hazy pulmonary opacity bilaterally which is central and basilar likely combination of pleural fluid and airspace disease. No pneumothorax. Vascular congestion. IMPRESSION: Stable bilateral pleural effusions and bilateral hazy airspace disease. Electronically Signed   By: Marybelle Killings M.D.   On: 03/13/2018 07:47   Dg Chest Port 1 View  Result Date: 03/12/2018 CLINICAL DATA:  Endotracheal tube placement. EXAM: PORTABLE CHEST 1 VIEW COMPARISON:  Chest radiograph performed 03/11/2018 FINDINGS: The patient's endotracheal tube is seen ending 1-2 cm above the carina. This could be retracted 2 cm. The patient's enteric tube is noted extending below the diaphragm. Small bilateral pleural effusions are noted. Hazy bilateral airspace opacities raise concern for pulmonary edema. No pneumothorax is seen. The cardiomediastinal silhouette is mildly enlarged. No acute osseous abnormalities are identified. IMPRESSION: 1. Endotracheal tube seen ending 1-2 cm above the carina. This could  be retracted 2 cm. 2. Small bilateral pleural effusions noted. Hazy bilateral airspace opacities raise concern for pulmonary edema. 3. Mild cardiomegaly. Electronically Signed   By: Garald Balding M.D.   On: 03/12/2018 06:38   Dg Chest Port 1 View  Result Date: 03/11/2018 CLINICAL DATA:  Acute respiratory failure with hypoxia. EXAM:  PORTABLE CHEST 1 VIEW COMPARISON:  03/10/2018 FINDINGS: Endotracheal tube has tip 5.6 cm above the carina. Nasogastric tube unchanged with tip over the stomach in the left upper quadrant. Lungs are adequately inflated with minimal persistent left retrocardiac opacification. Mild stable cardiomegaly. Remainder of the exam is unchanged. IMPRESSION: Mild left retrocardiac opacification unchanged likely atelectasis. Mild stable cardiomegaly. Tubes and lines as described. Electronically Signed   By: Marin Olp M.D.   On: 03/11/2018 08:17   Dg Chest Port 1 View  Result Date: 03/10/2018 CLINICAL DATA:  Cerebral infarction and respiratory failure. EXAM: PORTABLE CHEST 1 VIEW COMPARISON:  04/04/2018 FINDINGS: Endotracheal tube remains present with the tip approximately 3 cm above the carina. Gastric decompression tube has been advanced and now extends further into the stomach. Stable significant cardiac enlargement. Lungs show some improved aeration at the right base with mild residual right basilar atelectasis remaining. Left lower lobe atelectasis is relatively stable. No pulmonary edema, significant pleural fluid or pneumothorax. IMPRESSION: Advancement of gastric decompression tube further into the stomach. Improved aeration at the right lung base with residual mild right basilar atelectasis. Stable left lower lobe atelectasis. Electronically Signed   By: Aletta Edouard M.D.   On: 03/10/2018 08:18   Portable Chest X-ray  Result Date: 03/13/2018 CLINICAL DATA:  Intubated EXAM: PORTABLE CHEST 1 VIEW COMPARISON:  06/01/2017 chest radiograph. FINDINGS: Endotracheal tube tip is 4.2 cm above the carina. Enteric tube terminates at the esophagogastric junction with the side port in the lower thoracic esophagus. Stable cardiomediastinal silhouette with moderate cardiomegaly. No pneumothorax. No pleural effusion. Hazy lower parahilar lung opacities, most prominent in the right lower lung, worsened. IMPRESSION: 1.  Well-positioned endotracheal tube. 2. Enteric tube terminates at the esophagogastric junction with the side port in the lower thoracic esophagus, consider advancing 8-10 cm. 3. Moderate cardiomegaly. Worsening hazy lower parahilar lung opacities, most prominent in the right lower lung, favor pulmonary edema. Electronically Signed   By: Ilona Sorrel M.D.   On: 03/11/2018 22:12   Ir Percutaneous Art Thrombectomy/infusion Intracranial Inc Diag Angio  Result Date: 03/12/2018 INDICATION: 73 year old female with a history of acute left MCA stroke. She presents within time frame for IV tPA, and is a candidate for mechanical thrombectomy EXAM: ULTRASOUND GUIDED ACCESS RIGHT COMMON FEMORAL ARTERY ULTRASOUND GUIDED ACCESS LEFT COMMON FEMORAL ARTERY FOR ARTERIAL MONITORING ANGIOGRAM RIGHT LOWER EXTREMITY BALLOON ANGIOPLASTY STENOSIS RIGHT COMMON ILIAC ARTERY IN ORDER TO PASS 8 FRENCH SHEATH TO COMPLETE THE THROMBECTOMY CEREBRAL ANGIOGRAM MECHANICAL THROMBECTOMY LEFT MCA EMERGENT LARGE VESSEL OCCLUSION DEPLOYMENT OF ANGIO-SEAL RIGHT COMMON FEMORAL ARTERY COMPARISON:  CT imaging of the same day MEDICATIONS: 2 g Ancef IV. The antibiotic was administered within 1 hour of the procedure ANESTHESIA/SEDATION: General endotracheal tube anesthesia CONTRAST:  96 cc Isovue-300 FLUOROSCOPY TIME:  Fluoroscopy Time: 26 minutes 24 seconds (1,362 mGy). COMPLICATIONS: None TECHNIQUE: Informed written consent was obtained from the patient's family after a thorough discussion of the procedural risks, benefits and alternatives. Specific risks discussed include: Bleeding, infection, contrast reaction, kidney injury/failure, need for further procedure/surgery, arterial injury or dissection, embolization to new territory, intracranial hemorrhage (10-15% risk), neurologic deterioration, cardiopulmonary collapse, death. All questions were addressed. Maximal  Sterile Barrier Technique was utilized including during the procedure including caps, mask,  sterile gowns, sterile gloves, sterile drape, hand hygiene and skin antiseptic. A timeout was performed prior to the initiation of the procedure. The anesthesia team was present to provide general endotracheal tube anesthesia and for patient monitoring during the procedure. Interventional neuro radiology nursing staff was also present. 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 Surgical changes of prior endarterectomy evident on prior CT imaging. Left external carotid artery: Patent with antegrade flow. Left internal carotid artery: Normal course caliber and contour of the cervical portion. Vertical and petrous segment patent with normal course caliber contour. Cavernous segment patent. Clinoid segment patent. Antegrade flow of the ophthalmic artery. Ophthalmic segment patent. Terminus patent. Left MCA: Distal M1 occluded at the initiation of the case. There is partial filling of an inferior branch which is the non dominant branch to the frontal. Patent fetal PCA to the left P2 segment. Left ACA: A 1 segment patent. A 2 segment perfuses the right territory. Completion Findings: Left MCA: After 2 passes of mechanical thrombectomy plus aspiration, there is near complete restoration of flow through the left MCA territory, with slow flow through an M3/M4 branch of the parietal region compatible with TI CI 2b flow. 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 Physical exam At the initiation of the case, palpable bilateral common femoral artery pulses present No palpable distal pulses Doppler positive pulses of the right posterior tibial and anterior tibial (with systolic blood pressures above 200) Doppler positive pulse at the left posterior tibial (with systolic blood pressure above 200), with no Doppler signal at the left anterior tibial At completion of case  Doppler positive pulses were only discovered on the right posterior tibial and anterior tibial arteries with blood pressures above 200. In the 140-160 range (goal) Doppler signal was not evident A completion of case Doppler positive pulses were only discovered on the left at the posterior tibial and not the left anterior tibial (with the pressure above 585 systolic). PROCEDURE: Patient is brought emergently to the neuro angiography suite, with the patient identified appropriately and placed supine position on the table. Left radial arterial line was attempted by the anesthesia team. The patient is then prepped and draped in the usual sterile fashion. Ultrasound survey of the right inguinal region was performed with images stored and sent to PACs. 11 blade scalpel was used to make a small incision. Blunt dissection was performed. A micropuncture needle was used access the right common femoral artery under ultrasound. With excellent arterial blood flow returned, an .018 micro wire was passed through the needle, observed to enter the abdominal aorta under fluoroscopy. The needle was removed, and a micropuncture sheath was placed over the wire. The inner dilator and wire were removed, and an 035 Bentson wire was advanced under fluoroscopy into the abdominal aorta. The sheath was removed and a standard 5 [place] vascular sheath was placed. The dilator was removed and the sheath was flushed. Ultrasound survey of the left inguinal region was then performed with images stored and sent to PACs, confirming patency of the vessel. A micropuncture needle was used access the left common femoral artery under ultrasound. With excellent arterial blood flow returned, and an .018 micro wire was passed through the needle, observed enter the abdominal aorta under fluoroscopy. The needle was removed, and a micropuncture sheath was placed over the  wire. The inner dilator and wire were removed, and an 035 Bentson wire was advanced under  fluoroscopy into the abdominal aorta. The sheath was removed and a standard 4 [place] vascular sheath was placed for arterial monitoring. The dilator was removed and the sheath was flushed. A 63F JB-1 diagnostic catheter was then advanced over the wire through the existing right femoral access to the proximal descending thoracic aorta. Wire was then removed. Double flush of the catheter was performed. Catheter was then used to select the left cervical internal carotid artery. Formal angiogram was performed, with roadmap achieved. Exchange length Rosen wire was then passed through the diagnostic catheter to the distal cervical ICA and the diagnostic catheter was removed. The 5 French sheath was removed, with attempt at placing 8 French 55 cm bright tip sheath. Significant resistance was encountered with passing the sheath through the right iliac system. The sheath would not advanced through the pre-existing right common iliac artery stent. Sheath was withdrawn and rotated. We attempted to advance the sheath over the introducer which was pinned on the wire. We also removed the introducer in place to diagnostic catheter into the aorta and attempted to pass the sheath over the diagnostic catheter. None of these maneuvers were successful. Sheath was then withdrawn into the external iliac artery. Balloon angioplasty was performed of the pre-existing right common iliac artery stent with 5 mm x 40 mm standard balloon angioplasty. Balloon was removed and the introducer was then replaced. Eight French sheath was then again attempted to pass over the wire, unsuccessful through this segment of the iliac artery. The introducer was removed, diagnostic catheter was placed into the cervical segment of the internal carotid artery, and the Rose an wire was exchanged for a 260 cm Amplatz wire. With the stiff wire in place, another attempt was made to pass the sheath which was unsuccessful. Sheath was then again withdrawn to the external  iliac artery, introducer was removed, and a second balloon angioplasty 6 mm x 40 mm was performed at the pre-existing stent. The sheath was then successfully advanced over the balloon as the balloon was deflated, into the abdominal aorta. The introducer was again placed after removing the balloon and the sheath was advanced 6 successfully into the aorta. Introducer was withdrawn. Once the sheath was advanced over the wire to the thoracic aorta, sheath was flushed and attached to pressurized and heparinized saline bag for constant forward flow. Eight French 95 cm flow gate balloon catheter was then advanced over the wire to the distal cervical segment. Wire was removed. Then a coaxial intermediate catheter and microcatheter combination was prepared on the back table. This combination was penumbra Ace 64 catheter and a Trevo Provue18 microcatheter, with a synchro soft wire. This combination was then advanced through the balloon guide into the ICA. System was advanced into the internal carotid artery, to the level of the occlusion. The micro wire was then carefully advanced through the occluded segment, with a distal knuckle configuration. Microcatheter was then pushed through the occluded segment and the wire was removed. Intermediate catheter was advanced over the micro wire to the carotid siphon, which would not advance beyond the ophthalmic segment through the terminus. Blood was then aspirated through the hub of the microcatheter, and a gentle contrast injection was performed confirming intraluminal position. A rotating hemostatic valve was then attached to the back end of the microcatheter, and a pressurized and heparinized saline bag was attached to the catheter. 4 mm x 40 mm solitaire  device was then selected. Back flush was achieved at the rotating hemostatic valve, and then the device was gently advanced through the microcatheter to the distal end. The retriever was then unsheathed by withdrawing the  microcatheter under fluoroscopy. Once the retriever was completely unsheathed, the microcatheter was carefully stripped from the delivery wire of the device. Control angiogram was then performed from the balloon catheter. 3 minute time interval was observed. The balloon on the balloon guide was then inflated to profile of the vessel. Constant aspiration was then performed through the intermediate catheter, and constant gentle aspiration was performed at the balloon guide. This aspiration was continued as the retriever was gently and slowly withdrawn with fluoroscopic observation into the distal intermediate catheter. The entire system was then gently withdrawn from the intracranial ICA and into the balloon guide. Once the retriever was entirely removed from the system, free aspiration was confirmed at the hub of the balloon guide, with free blood return confirmed. Control angiogram was then performed. TICI 2a flow was achieved. Repeat angiogram with multiple angulation demonstrated persistent occlusion of parietal branch. The same coaxial system was again passed through the balloon guide catheter. The microcatheter system was then advanced through the occlusion of the parietal branch. Once the micro wire microcatheter were beyond the occlusion, the micro wire was removed and stentreiver device was deployed, stripping the microcatheter from the wire. After the device was deployed across the occlusion, the intermediate catheter was again attempted to advanced to the M1 segment. This was unsuccessful, and would not advance beyond the ophthalmic segment. The balloon at the balloon guide was inflated to profile of the vessel. Local aspiration was performed at the intermediate catheter upon withdrawal of the device under fluoroscopic observation, with aspiration at the balloon guide. Once the retrieve her and intermediate catheter were entirely removed from the system, free aspiration was confirmed at the hub of the  intermediate catheter, with free blood return confirmed. Control angiogram was again performed. Improved flow was confirmed, TICI 2b. Balloon guide was withdrawn and angiogram of the cervical segment was performed. We then removed the balloon guide catheter and deployed an 8 French Angio-Seal at the right common femoral artery access. Angiogram was performed of the right sided access and the left-sided access before completing the case. Patient tolerated the procedure well and remained hemodynamically stable throughout. No complications were encountered. Estimated blood loss approximately 50 cc. IMPRESSION: Status post ultrasound guided access right common femoral artery for cerebral angiogram and a combination of aspiration and mechanical thrombectomy of left M1 emergent large vessel occlusion, and restoration of TICI 2b flow after 2 passes. Status post balloon angioplasty to 6 mm of proximal right common iliac artery for treatment of a restrictive lesion in order to pass 8 French sheath to perform the thrombectomy. Status post deployment of Angio-Seal at the right common femoral artery. At the conclusion of the case, a 4 [place] sheath remains in left common femoral artery for arterial monitoring. Signed, Dulcy Fanny. Dellia Nims, RPVI [facility] Radiology PLAN: Formal CT after the case Right hip straight overnight status post 8 French device and Angio-Seal closure Left common femoral artery for French sheath may be removed when hemodynamic monitoring is no longer needed with manual pressure. Goal blood pressure 660 [id number] systolic given the incomplete flow restoration Electronically Signed   By: Corrie Mckusick D.O.   On: 03/30/2018 20:39   Ct Head Code Stroke Wo Contrast  Result Date: 03/16/2018 CLINICAL DATA:  Code stroke.  Deficit not specified EXAM: CT HEAD WITHOUT CONTRAST TECHNIQUE: Contiguous axial images were obtained from the base of the skull through  the vertex without intravenous contrast. COMPARISON:  Head CT 07/18/2016 and brain MRI 07/20/2016 FINDINGS: Brain: Multiple remote lacunar infarcts in the deep white matter and deep gray nuclei, chronic when compared to prior head CT and brain MRI. Small remote left cerebellar infarct. No evidence of acute infarct. No hemorrhage or hydrocephalus. Vascular: No hyperdense vessel. Skull: Normal. Negative for fracture or focal lesion. Sinuses/Orbits: No acute finding. Other: These results were communicated to Dr. Rory Percy at 4:43 pmon 01/08/2020by text page via the [facility] messaging system. ASPECTS [facility] Stroke Program Early CT Score) Not scored without localizing symptoms. Motion artifact at the vertex blurring cortex. IMPRESSION: 1. No acute finding. 2. Motion artifact at the vertex. 3. Multiple remote small vessel infarcts. Electronically Signed   By: Monte Fantasia M.D.   On: 03/21/2018 16:45   Vas [place] Lower Extremity Venous (dvt)  Result Date: 03/09/2018  Lower Venous Study Indications: Stroke.  Limitations: Body habitus. Performing Technologist: Maudry Mayhew MHA, RDMS, RVT, RDCS  Examination Guidelines: A complete evaluation includes B-mode imaging, spectral Doppler, color Doppler, and power Doppler as needed of all accessible portions of each vessel. Bilateral testing is considered an integral part of a complete examination. Limited examinations for reoccurring indications may be performed as noted.  Right Venous Findings: +---------+---------------+---------+-----------+----------+--------------+          CompressibilityPhasicitySpontaneityPropertiesSummary        +---------+---------------+---------+-----------+----------+--------------+ CFV                                                   Not visualized +---------+---------------+---------+-----------+----------+--------------+ SFJ                                                   Not visualized  +---------+---------------+---------+-----------+----------+--------------+ FV Prox  Full                                                        +---------+---------------+---------+-----------+----------+--------------+ FV Mid   Full                                                        +---------+---------------+---------+-----------+----------+--------------+ FV DistalFull                                                        +---------+---------------+---------+-----------+----------+--------------+ PFV      Full                                                        +---------+---------------+---------+-----------+----------+--------------+  POP      Full           Yes      Yes                                 +---------+---------------+---------+-----------+----------+--------------+ PTV      Full                                                        +---------+---------------+---------+-----------+----------+--------------+ PERO                                                  Not visualized +---------+---------------+---------+-----------+----------+--------------+  Left Venous Findings: +---------+---------------+---------+-----------+----------+-------+          CompressibilityPhasicitySpontaneityPropertiesSummary +---------+---------------+---------+-----------+----------+-------+ CFV      Full           Yes      Yes                          +---------+---------------+---------+-----------+----------+-------+ SFJ      Full                                                 +---------+---------------+---------+-----------+----------+-------+ FV Prox  Full                                                 +---------+---------------+---------+-----------+----------+-------+ FV Mid   Full                                                 +---------+---------------+---------+-----------+----------+-------+ FV DistalFull                                                  +---------+---------------+---------+-----------+----------+-------+ PFV      Full                                                 +---------+---------------+---------+-----------+----------+-------+ POP      Full           Yes      Yes                          +---------+---------------+---------+-----------+----------+-------+ PTV      Full                                                 +---------+---------------+---------+-----------+----------+-------+  PERO     Full                                                 +---------+---------------+---------+-----------+----------+-------+    Summary: Right: There is no evidence of deep vein thrombosis in the lower extremity. However, portions of this examination were limited- see technologist comments above. No cystic structure found in the popliteal fossa. Left: There is no evidence of deep vein thrombosis in the lower extremity. No cystic structure found in the popliteal fossa.  *See table(s) above for measurements and observations. Electronically signed by Curt Jews MD on 03/09/2018 at 5:40:37 PM.    Final     PHYSICAL EXAM  Temp:  [99 F (37.2 C)-100.8 F (38.2 C)] 100.2 F (37.9 C) (01/09 1200) Pulse Rate:  [74-87] 77 (01/09 1215) Resp:  [23-28] 24 (01/09 1215) BP: (72-109)/(42-65) 72/42 (01/09 1215) SpO2:  [99 %-100 %] 100 % (01/09 1524) FiO2 (%):  [40 %] 40 % (01/09 1524) Weight:  [73.5 kg] 73.5 kg (01/09 0500)  General - Well nourished, well developed, intubated on sedation.  Ophthalmologic - fundi not visualized due to noncooperation.  Cardiovascular - Regular rate and rhythm.  Neuro - intubated now off  sedation,   she is awake aphasic and unresponsive and not following commands. Globally aphasic Eyes mid position, no deviation, however no doll's eyes.  Not blinking to visual threat bilaterally.  Pupil 2 mm bilaterally, reactive to light.  Left corneal present, right  corneal weak reflexes.  Positive gag and cough.  Facial symmetry not able to test due to ET tube.  Left upper and lower extremity spontaneous movements against gravity2/5 on pain stimulation.  Right upper and lower extremity trace withdrawal to pain stimulaiton.  DTR 1+, Babinski negative. Sensation, coordination and gait not tested.   ASSESSMENT/PLAN Yvonne Patel is a 73 y.o. female with history of stroke, hypertension, PVD, diabetes, hyperlipidemia, status post right CEA, CHF admitted for right-sided weakness and aphasia. TPA given.    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  However, cardioembolic source cannot be excluded.  Resultant intubated on sedation, right hemiparesis  CT showed multiple old lacunar infarcts  CTA head and neck left M1/M2 occlusion.  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  Right precentral gyrus petechial hemorrhage.  MRA left MCA patent with moderate M2 origin stenosis  CT head repeat shows slight increase in right precentral gyrus petechial hemorrhage given on DAPT  2D Echo EF 30 to 35% down from 40 to 45% in 05/2017  LE venous Doppler no DVT  LDL 92  HgbA1c 7.1  SCDs for VTE prophylaxis  aspirin 81 mg daily and clopidogrel 75 mg daily prior to admission, now on aspirin 81 and Plavix due to non-STEMI.   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 Einar Gip as outpatient  If patient has reasonable neurological functional outcome, may consider outpatient follow-up with vascular surgery for subclavian artery stenting  Non-STEMI and cardiomyopathy  Troponin 4.62-6.47-5.08-4.7-3.8-3.88  EF 30 to 35% down from 40 to 45% in 05/2017  Cardiology on board  No intervention recommended  Started on aspirin and Plavix  CT head repeat 03/11/18 shows slight increase in [facility] in  right precentral gyrus petechial hemorrhage given on DAPT  Tapered off nitroglycerin IV  AKI on CKD stage III  creatinine 2.20-2.25->2.97.4.68  CCM on board  BMP monitoring  continue IV fluid   Diabetes  HgbA1c 7.1 goal < 7.0  Uncontrolled  CBG monitoring  SSI  Hypertension . Stable . 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  Put on DAPT and Lipitor 80  Other Active Problems  Hypokalemia-supplement  Hyperglycemia   Hospital day # 8 Plan : Patient's condition remains critical with renal function worsening after CVVRT stopped but   liver enzymes are declining. Neurologically she remains unchanged..  I had a long discussion with the patient's daughter and Dr.McQuaid from critical care medicine a I spoke to the patient's  daughter>they understand her poor prognosis but would like to support her for the next few days and agree with DO NOT RESUSCITATE and one-way extubation  On Friday Apr 13, 2018 and no prolonged ventilatory support, trach, peg.This patient is critically ill due to left MCA stroke, severe multivessel  atherosclerosis and stenosis, CHF, non-STEMI, hyperglycemia and at significant risk of neurological worsening, death form recurrent stroke, hemorrhagic conversion, heart failure, seizure, DKA. This patient's care requires constant monitoring of vital signs, hemodynamics, respiratory and cardiac monitoring, review of multiple databases, neurological assessment, discussion with family, other specialists and medical decision making of high complexity. I spent 30 minutes of neurocritical care time in the care of this patient. I had long discussion with her daughter at bedside, updated pt current condition, treatment plan and potential prognosis.She expressed understanding and appreciation.    Antony Contras, MD Stroke Neurology 03/16/2018 4:47 PM    To contact Stroke Continuity provider, please refer to [email or web address] After hours, contact General Neurology

## 2018-03-16 NOTE — Progress Notes (Signed)
PT Cancellation Note  Patient Details Name: Yvonne Patel MRN: [id number] DOB: [birth year]   Cancelled Treatment:    Reason Eval/Treat Not Completed: Patient not medically ready.  Pt not following commands. Still on vent.  Per RN, wait until after wean attempt 1/10. 03/16/2018  Donnella Sham, [place] [phone]  (pager) [phone]  (office)   Tessie Fass Midori Dado 03/16/2018, 11:56 AM

## 2018-03-16 NOTE — Progress Notes (Addendum)
NAME:  Yvonne Patel, MRN:  [id number], DOB:  1945-10-27, LOS: 88 ADMISSION DATE:  04/05/2018, CONSULTATION DATE:  1/1 REFERRING MD:  Dr. Rory Percy , CHIEF COMPLAINT:  CVA   Brief History   73 year old female admitted with acute L MCA CVA s/p systemic TPA and mechanical thrombolysis.   Past Medical History  DM II TIA  Jehovah's witness > no blood products  HTN HLD CAD Cardiomyopathy  Significant Hospital Events   1/01 admit 1/03 CP, + troponin, Cardiology consulted.  Cleviprex, propofol.   1/04 Self extubated, reintubated for poor cough/airway protection 1/6: No improvement neurological exam, renal function worse, having bradycardic events. 1/7: CRRT initiated on 1/6.  Metabolic status regulated, acid-base improve, creatinine improved, glycemic control improved, lab data did suggest mild DKA anion gap now closed.  Family meeting completed.  Goals of care discussed.  DO NOT RESUSCITATE confirmed.  Plan would be to extubate 1/10 when eldest son arrives, no reintubation.  CRRT discontinued later that afternoon after dialysis machine clotted 1/8: No significant change. Sheath removed.  1/9 creatinine and K rising but UOP picking up marginally so treating w/ lasix and giving lokelma. Having low grade fever and rising WBC ct so checking sputum and cxr. Son to arrive 1/10. Family again confirming they want aggressive care to include even resuming CRRT if indicated in effort to optimize for extubation.    Consults:  Cardiology  Procedures:  VIR 1/1 >> procedure completed for left MCA occlusion at M1 segment. The procedure involved two passes of combination mechanical and aspiration thrombectomy with restoration of TICI 2b flow. L femoral art line 1/1 >>1/8 ETT 1/1 >>   Significant Diagnostic Tests:  CT head 1/1 > no acute finding, multiple remote small vessel infarcts CTA head/neck 1/1 > Left M2 branch occlusion beginning at the M1 bifurcation. Severe atherosclerosis which preferentially  spares the left carotid circulation, question prior endarterectomy. Severe atheromatous narrowing if not occlusion of bilateral proximal subclavian arteries. There is bilateral fetal type PCA with diffuse thready flow in the posterior fossa. 70% atheromatous narrowing of the right common carotid and ICA bulb. CT head 1/1 (post IR) > New 3 mm high-density within the posterior right frontal cortex which could be focus of hemorrhage or an enhancing subacute infarct. No hemorrhage or infarct seen along the postoperative left MCA distribution. TTE 1/1 >> LVEF 30-35%, mod global & severe inferior / inferolateral hypokinesis,  MRI Brain 1/2 >> moderate left MCA infarct, punctate acute infarcts involving the left caudate, right centrum semiovale, parasagittal right parietal cortex, bilateral cerebellar hemispheres.  4 mm hemorrhage right precentral gyrus.  Numerous microhemorrhages present basal ganglia both cerebral hemispheres, cerebellum. Advanced intracranial atherosclerosis, moderate ICA stenoses, chronic segmental occlusion basilar artery with decreased bilateral vertebral artery flow  Micro Data:  1/1 MRSA PCR  >> neg  Antimicrobials:  1/1 ancef preop  Interim history/subjective:  Low gd fevers  Objective   Blood pressure (Abnormal) 86/59, pulse 82, temperature 100.1 F (37.8 C), temperature source Axillary, resp. rate (Abnormal) 24, height 5\' 5"  (1.651 m), weight 73.5 kg, SpO2 100 %.    Vent Mode: PRVC FiO2 (%):  [40 %] 40 % Set Rate:  [16 bmp] 16 bmp Vt Set:  [450 mL] 450 mL PEEP:  [5 cmH20] 5 cmH20 Pressure Support:  [8 cmH20] 8 cmH20 Plateau Pressure:  [30 cmH20] 30 cmH20   Intake/Output Summary (Last 24 hours) at 03/16/2018 1011 Last data filed at 03/16/2018 0800 Gross per 24 hour  Intake 1594.9 ml  Output 158 ml  Net 1436.9 ml   Filed Weights   03/14/18 0500 03/15/18 0500 03/16/18 0500  Weight: 82.6 kg 73.5 kg 73.5 kg    Examination:  General: 73 year old female patient  currently resting in bed anxious and restless at times HEENT normocephalic atraumatic orally intubated mucous membranes moist Pulmonary: Scattered rhonchi more accessory use today on pressure support ventilation so switch back to full support Cardiac: Regular rate and rhythm no murmur rub or gallop Abdomen: Soft nontender Extremities: Dependent edema, cool, pulses Doppler only. Neuro: Awake, interactive with family.  Right-sided hemiparesis  Resolved Hospital Problem list   Intermittent bradycardia, likely exacerbated by acidosis and beta-blockade resolved 1/7 Progressive anion gap metabolic acidosis in setting of mild DKA resolved/1/7 Assessment & Plan:   Acute CVA:  Left M1/M2 distribution. S/p systemic TPA 1/1 at 1700, and Neuro IR thrombectomy.  Associated hemorrhage. P: Cont supportive care  Acute respiratory insufficency related to above  -failed self extubation 1/5 -WOB a little worse today  P: Back to full vent support  PAD protocol (limiting sedation) Sputum culture and PCXR today  VAP bundle Work towards extubation (hopefully 1/9) It will be one way w/ no re-intubation   Low grade fever and Leukocytosis P: Ck sputum culture  Hypertension PAD -difficult to monitor BP due to severe subclavian stenosis -do not think that her current BP is accurate. BP 60 pts lower since removing a line P: Dc scheduled hydralazine Adding lasix   Chest pain NSTEMI - troponin  peaked Systolic HF - TTE 1/1 with EF 30-35% P: Cont tele No BB due to bradycardia   Acute on chronic oliguric renal failure, suspect some component of hypoperfusion, dye load Mild Hyperkalemia  -Remains oliguric, -Seen by nephrology, baseline creatinine 1.5-1.8.  Started on CRRT on 1/6, creatinine improved, metabolic status improved.  Catheter clotted on 1/7 creatinine has cont to increase since time of stopping CRRT but UOP still seems to be marginally pickling up. No indication for HD currently however  Rising K is concerning p: Renal dose meds Lasix 120 mg today Lokelma today   Abnormal LFTs -Question shock liver-->improved P: Trend  Hold her statin   DM2, mild DKA resolved as of 1/7  p: Cont ssi and lantus   Best practice:  Diet: NPO Pain/Anxiety/Delirium protocol (if indicated): propofol RASS goal 0 to -1 VAP protocol (if indicated): per protocol DVT prophylaxis: SCD GI prophylaxis: protonix Glucose control: ssi Mobility: BR Code Status: Full Family Communication:  Goals of care today.  Family leaning towards DO NOT RESUSCITATE.  Anticipating one-way extubation on 1/10 Disposition: remains critically ill due to ventilator dependence and worsening renal fxn. Family still wants aggressive therapy aiming towards optimization prior to extubation as they all understand we will not re-intubate. Today we will push diuresis. I am hoping for renal recovery. UOP seems to be picking up marginally. There is no indication for HD emergently however her K is concerning. I updated her family. Her eldest son Thayer Jew will be arriving 1/10. He tells me They will want to pursue HD again if creatinine rises  > 5. I tried to explain that cr alone would not be an indication for HD. I am hopeful that non-invasive rx w/ diuretics will help some. Although we are planned for extubation in am; I suspect that family will not want to proceed if renal fxn worsening & will want CRRT  Again hoping that she will be optimized for extubation first.   Critical  care time:  32 minutes       Erick Colace ACNP-BC [place] Pager # [phone] OR # [phone] if no answer

## 2018-03-17 ENCOUNTER — Inpatient Hospital Stay (HOSPITAL_COMMUNITY): Payer: PPO

## 2018-03-17 LAB — RENAL FUNCTION PANEL
Albumin: 1.5 g/dL — ABNORMAL LOW (ref 3.5–5.0)
Anion gap: 18 — ABNORMAL HIGH (ref 5–15)
BUN: 142 mg/dL — ABNORMAL HIGH (ref 8–23)
CO2: 19 mmol/L — ABNORMAL LOW (ref 22–32)
Calcium: 8.2 mg/dL — ABNORMAL LOW (ref 8.9–10.3)
Chloride: 101 mmol/L (ref 98–111)
Creatinine, Ser: 5.79 mg/dL — ABNORMAL HIGH (ref 0.44–1.00)
GFR calc Af Amer: 8 mL/min — ABNORMAL LOW (ref >60)
GFR calc non Af Amer: 7 mL/min — ABNORMAL LOW (ref >60)
Glucose, Bld: 217 mg/dL — ABNORMAL HIGH (ref 70–99)
Phosphorus: 7.6 mg/dL — ABNORMAL HIGH (ref 2.5–4.6)
Potassium: 4.3 mmol/L (ref 3.5–5.1)
Sodium: 138 mmol/L (ref 135–145)

## 2018-03-17 LAB — POCT I-STAT 3, ART BLOOD GAS (G3+)
ACID-BASE DEFICIT: 8 mmol/L — AB (ref 0.0–2.0)
BICARBONATE: 15.9 mmol/L — AB (ref 20.0–28.0)
O2 Saturation: 100 %
PO2 ART: 196 mmHg — AB (ref 83.0–108.0)
Patient temperature: 101.3
TCO2: 17 mmol/L — ABNORMAL LOW (ref 22–32)
pCO2 arterial: 27.3 mmHg — ABNORMAL LOW (ref 32.0–48.0)
pH, Arterial: 7.378 (ref 7.350–7.450)

## 2018-03-17 LAB — GLUCOSE, CAPILLARY
Glucose-Capillary: 207 mg/dL — ABNORMAL HIGH (ref 70–99)
Glucose-Capillary: 198 mg/dL — ABNORMAL HIGH (ref 70–99)
Glucose-Capillary: 208 mg/dL — ABNORMAL HIGH (ref 70–99)
Glucose-Capillary: 189 mg/dL — ABNORMAL HIGH (ref 70–99)
Glucose-Capillary: 133 mg/dL — ABNORMAL HIGH (ref 70–99)

## 2018-03-17 LAB — TROPONIN I: Troponin I: 2.97 ng/mL (ref <0.03)

## 2018-03-17 LAB — LACTIC ACID, PLASMA: Lactic Acid, Venous: 10.8 mmol/L (ref 0.5–1.9)

## 2018-03-17 LAB — MAGNESIUM: Magnesium: 2.9 mg/dL — ABNORMAL HIGH (ref 1.7–2.4)

## 2018-03-17 MED ORDER — PRISMASOL BGK 4/2.5 32-4-2.5 MEQ/L REPLACEMENT SOLN
Status: DC
  Administered 2018-03-17: 17:00:00 via INTRAVENOUS_CENTRAL
  Filled 2018-03-17 (×4): qty 5000

## 2018-03-17 MED ORDER — SENNOSIDES-DOCUSATE SODIUM 8.6-50 MG PO TABS: 1.0000 | ORAL_TABLET | Freq: Every day | ORAL | Status: DC

## 2018-03-17 MED ORDER — MORPHINE 100MG IN NS 100ML (1MG/ML) PREMIX INFUSION
1.0000 mg/h | INTRAVENOUS | Status: DC
  Filled 2018-03-17: qty 100

## 2018-03-17 MED ORDER — PRISMASOL BGK 4/2.5 32-4-2.5 MEQ/L REPLACEMENT SOLN
Status: DC
  Administered 2018-03-17: 16:00:00 via INTRAVENOUS_CENTRAL
  Filled 2018-03-17 (×3): qty 5000

## 2018-03-17 MED ORDER — LABETALOL HCL 5 MG/ML IV SOLN
2.5000 mg | INTRAVENOUS | Status: DC | PRN
  Filled 2018-03-17: qty 4

## 2018-03-17 MED ORDER — PRISMASOL BGK 4/2.5 32-4-2.5 MEQ/L IV SOLN
INTRAVENOUS | Status: DC
  Administered 2018-03-17: 16:00:00 via INTRAVENOUS_CENTRAL
  Filled 2018-03-17 (×7): qty 5000

## 2018-03-17 MED ORDER — SODIUM CHLORIDE 0.9 % FOR CRRT
INTRAVENOUS_CENTRAL | Status: DC | PRN
  Filled 2018-03-17: qty 1000

## 2018-03-17 MED ORDER — HEPARIN SODIUM (PORCINE) 1000 UNIT/ML DIALYSIS
1000.0000 [IU] | INTRAMUSCULAR | Status: DC | PRN
  Administered 2018-03-17: 2400 [IU] via INTRAVENOUS_CENTRAL
  Filled 2018-03-17: qty 3
  Filled 2018-03-17 (×3): qty 6

## 2018-03-17 MED ORDER — SODIUM CHLORIDE 0.9 % IV SOLN
1.0000 g | INTRAVENOUS | Status: DC
  Administered 2018-03-17: 1 g via INTRAVENOUS
  Filled 2018-03-17 (×2): qty 1

## 2018-03-18 DIAGNOSIS — R7989 Other specified abnormal findings of blood chemistry: Secondary | ICD-10-CM | POA: Diagnosis not present

## 2018-03-18 DIAGNOSIS — R945 Abnormal results of liver function studies: Secondary | ICD-10-CM | POA: Diagnosis not present

## 2018-03-18 DIAGNOSIS — I214 Non-ST elevation (NSTEMI) myocardial infarction: Secondary | ICD-10-CM | POA: Diagnosis not present

## 2018-03-18 DIAGNOSIS — I4891 Unspecified atrial fibrillation: Secondary | ICD-10-CM | POA: Diagnosis not present

## 2018-03-18 DIAGNOSIS — E872 Acidosis, unspecified: Secondary | ICD-10-CM | POA: Diagnosis not present

## 2018-03-18 DIAGNOSIS — I5020 Unspecified systolic (congestive) heart failure: Secondary | ICD-10-CM | POA: Diagnosis present

## 2018-03-18 LAB — CULTURE, RESPIRATORY W GRAM STAIN

## 2018-04-08 NOTE — Death Summary Note (Signed)
Patient asystole on monitor at 06-16-42, auscultation completed with Linford Arnold, RN. Negative breath and cardiac sounds. Verified with Elsie Lincoln, MD. Time of death confirmed 06/16/2142. Family in waiting room, notified. Emotional support given, chaplain presence offered but declined. CDS referral done at 0100 at 04/04/2018, referral number [id number].

## 2018-04-08 NOTE — Progress Notes (Addendum)
Nurse started CRRT per orders at 1700. Within the first twenty minutes of therapy the patients heart rate went from a stable sinus rhythm to a SVT in the 200's then spontaneously went into sinus brady in the 50's back and forth. Patient also became very hypotensive with systolic blood pressures  In the 60's. Paiged Dr. Pennie Banter to inform him of patient status. Nurse  instructed to discontinue CRRT, get a stat ABG and alert the family of the changes. Patient remains in ST/SVT in the 150's. Patients SBP is still in the 70's. Family and MD aware. Will continue to Monitor.

## 2018-04-08 NOTE — Progress Notes (Signed)
PT Cancellation Note  Patient Details Name: Yvonne Patel MRN: [id number] DOB: 1945/08/13   Cancelled Treatment:    Reason Eval/Treat Not Completed: Patient not medically ready(vent with possible one way extubation)   [name] [name] Mar 25, 2018, 7:17 AM  Elwyn Reach, PT Acute Rehabilitation Services Pager: [phone] Office: [phone]

## 2018-04-08 NOTE — Progress Notes (Signed)
Patient desat into the low 80s with HR 140-160 around 1930, removed patient from ventilator and bagged patient with no response in patient saturation levels. RT at bedside. MD informed, stat EKG obtained. MD arrived at bedside, family meeting planned.

## 2018-04-08 NOTE — Progress Notes (Signed)
OT Cancellation Note  Patient Details Name: Yvonne Patel MRN: [id number] DOB: [birth year]   Cancelled Treatment:    Reason Eval/Treat Not Completed: Patient not medically ready;Active bedrest order.  Per notes, pt for possible one way extubation 03/28/2018.  Will check back over weekend or Monday to determine appropriateness of OT.  Lucille Passy, OTR/L [place] Pager [phone] Office [phone]   Lucille Passy M 03/28/2018, 5:32 AM

## 2018-04-08 NOTE — Progress Notes (Signed)
Family conference   Long discussion w/  Mrs Compean's children. They are convinced that she had remarkable improvement w/ CRRT.  They verbalize they understand that she is not a long term candidate.  They do not want trach They confirm she is DNR They request "one more try" with the dialysis to see if that makes her more awake. They all agree after this round they will accept that HD again after we stop will not be offered.  They all request a delay in extubation to "see if it improves things" They all agree to one way extubation after 48 more hours (Sunday) regardless of clinical improvement.   I have placed a call to Nephrology who has graciously agreed to helping [place].   Erick Colace ACNP-BC [place] Pager # [phone] OR # [phone] if no answer

## 2018-04-08 NOTE — Progress Notes (Signed)
NAME:  Yvonne Patel, MRN:  [id number], DOB:  1945-11-22, LOS: 72 ADMISSION DATE:  03/31/2018, CONSULTATION DATE:  1/1 REFERRING MD:  Dr. Rory Percy , CHIEF COMPLAINT:  CVA   Brief History   73 year old female admitted with acute L MCA CVA s/p systemic TPA and mechanical thrombolysis.   Past Medical History  DM II TIA  Jehovah's witness > no blood products  HTN HLD CAD Cardiomyopathy  Significant Hospital Events   1/01 admit 1/03 CP, + troponin, Cardiology consulted.  Cleviprex, propofol.   1/04 Self extubated, reintubated for poor cough/airway protection 1/6: No improvement neurological exam, renal function worse, having bradycardic events. 1/7: CRRT initiated on 1/6.  Metabolic status regulated, acid-base improve, creatinine improved, glycemic control improved, lab data did suggest mild DKA anion gap now closed.  Family meeting completed.  Goals of care discussed.  DO NOT RESUSCITATE confirmed.  Plan would be to extubate 1/10 when eldest son arrives, no reintubation.  CRRT discontinued later that afternoon after dialysis machine clotted 1/8: No significant change. Sheath removed.  1/9 creatinine and K rising but UOP picking up marginally so treating w/ lasix and giving lokelma. Having low grade fever and rising WBC ct so checking sputum and cxr. Son to arrive 1/10. Family again confirming they want aggressive care to include even resuming CRRT if indicated in effort to optimize for extubation.  1/10: Renal function continues to decline, acid-base worse.  Treated yesterday for hyperkalemia potassium has improved.  Sputum sent on the ninth showing abundant gram-negative rods so ceftaz started.  Anticipating family conference once again.  Consults:  Cardiology  Procedures:  VIR 1/1 >> procedure completed for left MCA occlusion at M1 segment. The procedure involved two passes of combination mechanical and aspiration thrombectomy with restoration of TICI 2b flow. L femoral art line 1/1  >>1/8 ETT 1/1 >>   Significant Diagnostic Tests:  CT head 1/1 > no acute finding, multiple remote small vessel infarcts CTA head/neck 1/1 > Left M2 branch occlusion beginning at the M1 bifurcation. Severe atherosclerosis which preferentially spares the left carotid circulation, question prior endarterectomy. Severe atheromatous narrowing if not occlusion of bilateral proximal subclavian arteries. There is bilateral fetal type PCA with diffuse thready flow in the posterior fossa. 70% atheromatous narrowing of the right common carotid and ICA bulb. CT head 1/1 (post IR) > New 3 mm high-density within the posterior right frontal cortex which could be focus of hemorrhage or an enhancing subacute infarct. No hemorrhage or infarct seen along the postoperative left MCA distribution. TTE 1/1 >> LVEF 30-35%, mod global & severe inferior / inferolateral hypokinesis,  MRI Brain 1/2 >> moderate left MCA infarct, punctate acute infarcts involving the left caudate, right centrum semiovale, parasagittal right parietal cortex, bilateral cerebellar hemispheres.  4 mm hemorrhage right precentral gyrus.  Numerous microhemorrhages present basal ganglia both cerebral hemispheres, cerebellum. Advanced intracranial atherosclerosis, moderate ICA stenoses, chronic segmental occlusion basilar artery with decreased bilateral vertebral artery flow  Micro Data:  1/1 MRSA PCR  >> neg  Antimicrobials:  1/1 ancef preop 1/10 ceftaz>>>  Interim history/subjective:   Objective   Blood pressure (Abnormal) 86/57, pulse 80, temperature 99.9 F (37.7 C), temperature source Axillary, resp. rate (Abnormal) 25, height 5\' 5"  (1.651 m), weight 83.9 kg, SpO2 100 %.    Vent Mode: CPAP;PSV FiO2 (%):  [40 %] 40 % Set Rate:  [16 bmp] 16 bmp Vt Set:  [450 mL] 450 mL PEEP:  [5 cmH20] 5 cmH20 Pressure  Support:  [10 [place] Pressure:  [15 cmH20-30 cmH20] 18 cmH20   Intake/Output Summary (Last 24 hours) at 04/10/18  0958 Last data filed at 10-Apr-2018 0800 Gross per 24 hour  Intake 1479.66 ml  Output 915 ml  Net 564.66 ml   Filed Weights   03/15/18 0500 03/16/18 0500 04/10/2018 0402  Weight: 73.5 kg 73.5 kg 83.9 kg    Examination: General: 73 year old female she is resting in bed there have been no changes neurologically HEENT normocephalic atraumatic orally intubated mucous membranes moist Pulmonary: Coarse scattered rhonchi no accessory use Cardiac: Regular rate and rhythm Abdomen: Soft nontender Extremities: Anasarca, extremities cool, pulses are palpable only Neuro: Opens eyes to stimulus intermittently.  Ongoing right-sided hemiparesis. GU: Minimal urine output persists  Resolved Hospital Problem list   Intermittent bradycardia, likely exacerbated by acidosis and beta-blockade resolved 1/7 Progressive anion gap metabolic acidosis in setting of mild DKA resolved/1/7 HTN Assessment & Plan:   Acute CVA:  Left M1/M2 distribution. S/p systemic TPA 1/1 at 1700, and Neuro IR thrombectomy.  Associated hemorrhage. P: Supportive care  Acute respiratory insufficency related to above  -failed self extubation 1/5 -WOB a little worse today  P: Continuing full support PAD protocol RASS goal 0 Will eventually be a one-way extubation, had initially planned on this being today however will need to re-revisit this with family given decline in renal function  Low grade fever and Leukocytosis P: We will add ceftazidime  PAD w/ severe bilateral subclavian stenosis  -difficult to monitor BP due to severe subclavian stenosis -do not think that her current BP is accurate. BP 60 pts lower since removing a line P: No acute interventions at this point Blood pressures not accurate, will not aggressively treat  Chest pain NSTEMI - troponin  peaked Systolic HF - TTE 1/1 with EF 30-35% P: Continue telemetry monitoring No beta-blockade  Acute on chronic oliguric renal failure, suspect some component of  hypoperfusion, dye load Mild Hyperkalemia and worsening metabolic acidosis once again -Remains oliguric, -Seen by nephrology, baseline creatinine 1.5-1.8.  Started on CRRT on 1/6, creatinine improved, metabolic status improved.  Catheter clotted on 1/7 creatinine has cont to increase since time of stopping CRRT urine output has maintained marginal even after Lasix attempt.  Creatinine, BUN both climbing.  Acid-base worse once again  p: Continue to renal dose medication Continue supportive care There is no current hard indication for dialysis however her BUN has progressed significantly We will need to discuss with family once again goals of care  Abnormal LFTs -Question shock liver-->improved P: A.m. LFTs assuming family wants to continue aggressive care Holding statins   DM2, mild DKA resolved as of 1/7  p:  Continue Lantus and sliding scale insulin we will adjust basal dosing  Best practice:  Diet: NPO Pain/Anxiety/Delirium protocol (if indicated): propofol RASS goal 0 to -1 VAP protocol (if indicated): per protocol DVT prophylaxis: SCD GI prophylaxis: protonix Glucose control: ssi Mobility: BR Code Status: Full Family Communication:  Goals of care today.  Family leaning towards DO NOT RESUSCITATE.  Anticipating one-way extubation on 1/10 Disposition: She remains critically ill due to ventilator dependence, worsening renal function, and need for interpretation of severe metabolic derangements.  I do not think she is going to improve, nor do I think dialysis will improve her situation.  We will need to revisit this with the family, they previously had indicated they want to "maximize everything" prior to extubation.  I think her renal injury is likely  irreversible, I do not know that it makes sense to keep offering dialysis now.  We will revisit this with the family once again, this may possibly delay kidney extubation  Critical care time: 32 minutes       Erick Colace  ACNP-BC [place] Pager # [phone] OR # [phone] if no answer

## 2018-04-08 NOTE — Death Summary Note (Signed)
DEATH SUMMARY   Patient Details  Name: Yvonne Patel MRN: [id number] DOB: 12/08/1945  Admission/Discharge Information   Admit Date:  04/07/2018  Date of Death: Date of Death: April 16, 2018  Time of Death: Time of Death: 06-27-42  Length of Stay: 06/15/2022  Referring Physician: Vernie Shanks, MD   Reason(s) for Hospitalization  L MCA stroke  Diagnoses  Preliminary cause of death:  Secondary Diagnoses (including complications and co-morbidities):  Principal Problem:   CVA (cerebral vascular accident) [facility]) Active Problems:   Acute renal failure superimposed on stage 3 chronic kidney disease ([place])   Acute respiratory failure ([place])   Atrial fibrillation with rapid ventricular response ([facility])   NSTEMI (non-ST elevated myocardial infarction) ([place])   Lactic acidosis   Systolic heart failure ([place])   PAD (peripheral artery disease) ([place])   Abnormal LFTs   Brief Hospital Course (including significant findings, care, treatment, and services provided and events leading to death)  Yvonne Patel is a 73 y.o. year old female who was admitted on 2018-04-07 with acute L MCA CVA. She underwent systemic tPA administration and mechanical thrombolysis.  Her hospitalization course was complicated by non-ST elevation myocardial infarction on 03/10/2018.  Neurologically, the patient continued to have poor progress with no significant improvement in neurological exam over the subsequent days.  On 03/13/2018, continuous renal replacement therapy was initiated for treatment of metabolic derangement and acid-base disturbances (including diabetic ketoacidosis), associated with acute renal failure.  Nonetheless, the patient failed to demonstrate any meaningful clinical improvement.  Patient experienced numerous technical difficulties while on continuous renal replacement treatment, which was abandoned on 03/15/2018.  At the request of the family, the patient did go through another attempt at reinitiation of CRRT on April 16, 2018;  however, the patient demonstrated tachyarrhythmias and hemodynamic instability while on CRRT, forcing discontinuation of this treatment modality.  During the patient's hospital course, numerous family meetings were held.  The family did stipulate DNR status and confirmed this CODE STATUS on several occasions.  However, with the hemodynamic instability experienced on CRRT on the evening of 04/16/18, the patient's children (3 sons, 2 daughters) unanimously decided that all further definitive therapy should be abandoned and that comfort measures only should be pursued.  The patient promptly died at 06/27/42 on 04-16-18.   Pertinent Labs and Studies  Significant Diagnostic Studies Ct Angio Head W Or Wo Contrast  Result Date: 07-Apr-2018 CLINICAL DATA:  Code stroke, deficit not specified EXAM: CT ANGIOGRAPHY HEAD AND NECK TECHNIQUE: Multidetector CT imaging of the head and neck was performed using the standard protocol during bolus administration of intravenous contrast. Multiplanar CT image reconstructions and MIPs were obtained to evaluate the vascular anatomy. Carotid stenosis measurements (when applicable) are obtained utilizing NASCET criteria, using the distal internal carotid diameter as the denominator. CONTRAST:  61mL ISOVUE-370 IOPAMIDOL (ISOVUE-370) INJECTION 76% COMPARISON:  None. FINDINGS: CTA NECK FINDINGS Aortic arch: Extensive atherosclerotic calcification. Three vessel branching Right carotid system: Diffuse mainly calcified atherosclerotic plaque which limits visualization of the lumen due to plaque blooming. There is distal common carotid stenosis that measures up to 70% (when compared to the more proximal vessel given the immediate downstream bifurcation). There is ICA bulb plaque with 70% stenosis. Left carotid system: Much less extensive atherosclerotic plaque, question prior carotid endarterectomy. No stenosis or ulceration. Vertebral arteries: Severe bilateral proximal subclavian stenosis, with  possible occlusion on the right. Narrowing on the left involves and extensive segment. No flow is seen within vertebral arteries until the V3 segments, there may  be retrograde flow Skeleton: Diffuse degenerative disease.  No acute finding Other neck: Bilateral cataract resection. Upper chest: Hazy density of the lungs which could be from atelectasis. No Kerley lines to implicate edema. Review of the MIP images confirms the above findings CTA HEAD FINDINGS Anterior circulation: Confluent atherosclerotic calcification on the carotid siphons with small vessel size and plaque blooming limiting luminal visualization. There is a left M2 branch occlusion beginning at the M1 bifurcation with subsequent downstream reconstitution. No contralateral embolism is seen. Posterior circulation: Tiny vertebrobasilar arteries. There may be a basilar interruption from atherosclerosis or developmental variant. Fetal type flow to both posterior cerebral arteries which are symmetrically patent Venous sinuses: Patent as permitted by contrast timing Anatomic variants: As above Delayed phase: Not obtained in the emergent setting Review of the MIP images confirms the above findings Case discussed with Dr. Rory Percy at 5:04 p.m. IMPRESSION: 1. Left M2 branch occlusion beginning at the M1 bifurcation. 2. Severe atherosclerosis which preferentially spares the left carotid circulation, question prior endarterectomy. 3. Severe atheromatous narrowing if not occlusion of bilateral proximal subclavian arteries. There is bilateral fetal type PCA with diffuse thready flow in the posterior fossa. 4. 70% atheromatous narrowing of the right common carotid and ICA bulb. Electronically Signed   By: Monte Fantasia M.D.   On: 03/16/2018 17:16   Dg Abd 1 View  Result Date: 03/12/2018 CLINICAL DATA:  Orogastric tube placement.  Stroke. EXAM: ABDOMEN - 1 VIEW COMPARISON:  None. FINDINGS: Gastric decompression catheter extends into the stomach with the tip likely  in the distal stomach near the antrum. No evidence of overt bowel obstruction or ileus. IMPRESSION: Orogastric tube extends into the stomach with the tip located in the distal stomach, likely near the antrum. Electronically Signed   By: Aletta Edouard M.D.   On: 03/12/2018 08:43   Ct Head Wo Contrast  Result Date: 03/11/2018 CLINICAL DATA:  Follow-up examination for acute stroke. EXAM: CT HEAD WITHOUT CONTRAST TECHNIQUE: Contiguous axial images were obtained from the base of the skull through the vertex without intravenous contrast. COMPARISON:  Comparison made with prior MRI from 03/09/2018 as well as CT from 03/31/2018. FINDINGS: Brain: There has been continued interval evolution of small to moderate sized left MCA territory infarct, overall stable in distribution as compared to previous MRI. Mild localized edema without significant mass effect. Additional involving subcentimeter left cerebellar infarct noted as well. No evidence for hemorrhagic transformation or other complication. Previously noted additional punctate subcentimeter infarcts not well seen by CT. Subcentimeter hyperdensity involving the right frontal cortex slightly increased in size on today's exam measuring 5 mm, consistent with previously identified small hemorrhage. No significant mass effect or edema. No other evidence for new or interval infarction. No other acute large vessel territory infarct. Underlying atrophy with chronic microvascular ischemic disease noted, stable. Vascular: No hyperdense vessel. Calcified atherosclerosis noted at the skull base. Skull: Scalp soft tissues and calvarium within normal limits. Sinuses/Orbits: Globes and orbital soft tissues demonstrate no acute finding. Paranasal sinuses and mastoid air cells remain clear. Other: None. IMPRESSION: 1. Normal expected interval evolution of small to moderate sized left MCA territory infarcts. No evidence for hemorrhagic transformation or other complication. Additional  previously identified subcentimeter acute ischemic infarcts not well visualized by CT. 2. Slight interval increase in size of cortically based right frontal parenchymal hemorrhage, now measuring 5 mm (previously 3 mm). 3. No other new acute intracranial abnormality. Electronically Signed   By: Jeannine Boga M.D.   On: 03/11/2018  03:41   Ct Head Wo Contrast  Result Date: 03/24/2018 CLINICAL DATA:  Post thrombectomy EXAM: CT HEAD WITHOUT CONTRAST TECHNIQUE: Contiguous axial images were obtained from the base of the skull through the vertex without intravenous contrast. COMPARISON:  Head CT from earlier today FINDINGS: Brain: 3 mm focus of high density along the high and posterior right frontal convexity, on reformats this appears to be intraparenchymal. No hemorrhagic conversion or visible infarct in the area of procedure. Multiple remote small vessel infarcts. Vascular: Major vessels are symmetrically enhancing. Skull: Negative Sinuses/Orbits: Negative These results were called by telephone at the time of interpretation on 03/15/2018 at 8:17 pm to Dr. Cheral Marker , who verbally acknowledged these results. IMPRESSION: New 3 mm high-density within the posterior right frontal cortex which could be focus of hemorrhage or an enhancing subacute infarct. No hemorrhage or infarct seen along the postoperative left MCA distribution. Electronically Signed   By: Monte Fantasia M.D.   On: 03/28/2018 20:18   Ct Angio Neck W Or Wo Contrast  Result Date: 03/22/2018 CLINICAL DATA:  Code stroke, deficit not specified EXAM: CT ANGIOGRAPHY HEAD AND NECK TECHNIQUE: Multidetector CT imaging of the head and neck was performed using the standard protocol during bolus administration of intravenous contrast. Multiplanar CT image reconstructions and MIPs were obtained to evaluate the vascular anatomy. Carotid stenosis measurements (when applicable) are obtained utilizing NASCET criteria, using the distal internal carotid diameter as  the denominator. CONTRAST:  52mL ISOVUE-370 IOPAMIDOL (ISOVUE-370) INJECTION 76% COMPARISON:  None. FINDINGS: CTA NECK FINDINGS Aortic arch: Extensive atherosclerotic calcification. Three vessel branching Right carotid system: Diffuse mainly calcified atherosclerotic plaque which limits visualization of the lumen due to plaque blooming. There is distal common carotid stenosis that measures up to 70% (when compared to the more proximal vessel given the immediate downstream bifurcation). There is ICA bulb plaque with 70% stenosis. Left carotid system: Much less extensive atherosclerotic plaque, question prior carotid endarterectomy. No stenosis or ulceration. Vertebral arteries: Severe bilateral proximal subclavian stenosis, with possible occlusion on the right. Narrowing on the left involves and extensive segment. No flow is seen within vertebral arteries until the V3 segments, there may be retrograde flow Skeleton: Diffuse degenerative disease.  No acute finding Other neck: Bilateral cataract resection. Upper chest: Hazy density of the lungs which could be from atelectasis. No Kerley lines to implicate edema. Review of the MIP images confirms the above findings CTA HEAD FINDINGS Anterior circulation: Confluent atherosclerotic calcification on the carotid siphons with small vessel size and plaque blooming limiting luminal visualization. There is a left M2 branch occlusion beginning at the M1 bifurcation with subsequent downstream reconstitution. No contralateral embolism is seen. Posterior circulation: Tiny vertebrobasilar arteries. There may be a basilar interruption from atherosclerosis or developmental variant. Fetal type flow to both posterior cerebral arteries which are symmetrically patent Venous sinuses: Patent as permitted by contrast timing Anatomic variants: As above Delayed phase: Not obtained in the emergent setting Review of the MIP images confirms the above findings Case discussed with Dr. Rory Percy at 5:04  p.m. IMPRESSION: 1. Left M2 branch occlusion beginning at the M1 bifurcation. 2. Severe atherosclerosis which preferentially spares the left carotid circulation, question prior endarterectomy. 3. Severe atheromatous narrowing if not occlusion of bilateral proximal subclavian arteries. There is bilateral fetal type PCA with diffuse thready flow in the posterior fossa. 4. 70% atheromatous narrowing of the right common carotid and ICA bulb. Electronically Signed   By: Monte Fantasia M.D.   On: 03/30/2018 17:16  Mr Jodene Nam Head Wo Contrast  Result Date: 03/09/2018 CLINICAL DATA:  Stroke follow-up. Status post endovascular revascularization of left MCA occlusion yesterday. EXAM: MRI HEAD WITHOUT CONTRAST MRA HEAD WITHOUT CONTRAST TECHNIQUE: Multiplanar, multiecho pulse sequences of the brain and surrounding structures were obtained without intravenous contrast. Angiographic images of the head were obtained using MRA technique without contrast. COMPARISON:  Head CT and CTA 03/25/2018. Brain MRI 07/20/2016. Head MRA 09/30/2011. FINDINGS: MRI HEAD FINDINGS Brain: There is a small to slightly moderate-sized acute left MCA infarct involving the insula, predominantly posterior frontal lobe as well as operculum, and small amount of the postcentral gyrus. There also punctate acute infarcts involving the left caudate nucleus, right centrum semiovale, parasagittal right parietal cortex, and likely left occipital white matter. There are also small acute infarcts in the left greater than right cerebellar hemispheres. Susceptibility artifact in the right precentral gyrus corresponds to the 4 mm acute hemorrhage on yesterday's CT with minimal surrounding edema. There are numerous chronic microhemorrhages clustered about the right insula and operculum which are new from the 2018 MRI. Multiple additional chronic microhemorrhages are present in the basal ganglia bilaterally and scattered throughout both cerebral hemispheres and  cerebellum. Patchy T2 hyperintensities in the cerebral white matter and pons have progressed from the prior MRI and are nonspecific but compatible with moderate chronic small-vessel ischemic disease. There are new chronic lacunar infarcts in the cerebral white matter, and there are chronic lacunar infarcts in the basal ganglia bilaterally. Small chronic infarcts in the cerebellar hemispheres have increased in number from the prior MRI. Mild cerebral atrophy is within normal limits for age. No mass, midline shift, or extra-axial fluid collection is identified. Vascular: Diminutive vertebrobasilar circulation, more fully evaluated below. Skull and upper cervical spine: Unremarkable bone marrow signal. Sinuses/Orbits: Bilateral cataract extraction. Paranasal sinuses and mastoid air cells are clear. Other: Partially visualized endotracheal and enteric tubes. MRA HEAD FINDINGS The distal vertebral arteries are diminutive with diminished flow most notable in the distal right V3 and V4 segments and with bilateral proximal vertebral artery occlusion shown on yesterday's neck CTA. Patent bilateral PICA, right AICA, and bilateral SCA origins are identified. There is a chronic lack of visualization of the mid basilar artery which appears patent more proximally and distally, and this may reflect segmental atherosclerotic occlusion or a developmental variant. There are large posterior communicating arteries bilaterally with absence or severe hypoplasia of the right P1 segment. Both PCAs are patent without evidence of significant proximal stenosis. The internal carotid arteries are patent from skull base to carotid termini with extensive bilateral cavernous segment atherosclerotic irregularity resulting in mild stenosis on the right and moderate stenosis on the left. 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 ACAs and MCAs are patent with branch vessel irregularity but no evidence of proximal branch occlusion. The left MCA trifurcation remains patent following thrombectomy although there is moderate stenosis of the inferior and mid M2 origins. There also mild proximal left M1 and left A1 stenoses. IMPRESSION: 1. Small to moderate-sized acute left MCA infarct. 2. Additional predominantly subcentimeter acute infarcts in the cerebral and cerebellar hemispheres bilaterally. 3. 4 mm acute hemorrhage in the right precentral gyrus as seen on CT yesterday. 4. Progressive chronic small vessel ischemia since 2018 with increased number of chronic microhemorrhages and chronic lacunar infarcts. 5. Advanced intracranial atherosclerosis. Revascularized left MCA bifurcation remains patent with moderate  M2 origin stenoses. 6. Moderate ICA stenoses. 7. Diminished flow in the distal vertebral arteries with bilateral proximal occlusion shown on prior CTA. 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 8. 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 Electronically Signed   By: Logan Bores M.D.   On: 03/09/2018 17:17   Mr Brain Wo Contrast  Result Date: 03/09/2018 CLINICAL DATA:  Stroke follow-up. Status post endovascular revascularization of left MCA occlusion yesterday. EXAM: MRI HEAD WITHOUT CONTRAST MRA HEAD WITHOUT CONTRAST TECHNIQUE: Multiplanar, multiecho pulse sequences of the brain and surrounding structures were obtained without intravenous contrast. Angiographic images of the head were obtained using MRA technique without contrast. COMPARISON:  Head CT and CTA 03/20/2018. Brain MRI 07/20/2016. Head MRA 09/30/2011. FINDINGS: MRI HEAD FINDINGS Brain: There is a small to slightly moderate-sized acute left MCA infarct involving the insula, predominantly posterior frontal lobe as well as operculum, and small  amount of the postcentral gyrus. There also punctate acute infarcts involving the left caudate nucleus, right centrum semiovale, parasagittal right parietal cortex, and likely left occipital white matter. There are also small acute infarcts in the left greater than right cerebellar hemispheres. Susceptibility artifact in the right precentral gyrus corresponds to the 4 mm acute hemorrhage on yesterday's CT with minimal surrounding edema. There are numerous chronic microhemorrhages clustered about the right insula and operculum which are new from the 2018 MRI. Multiple additional chronic microhemorrhages are present in the basal ganglia bilaterally and scattered throughout both cerebral hemispheres and cerebellum. Patchy T2 hyperintensities in the cerebral white matter and pons have progressed from the prior MRI and are nonspecific but compatible with moderate chronic small-vessel ischemic disease. There are new chronic lacunar infarcts in the cerebral white matter, and there are chronic lacunar infarcts in the basal ganglia bilaterally. Small chronic infarcts in the cerebellar hemispheres have increased in number from the prior MRI. Mild cerebral atrophy is within normal limits for age. No mass, midline shift, or extra-axial fluid collection is identified. Vascular: Diminutive vertebrobasilar circulation, more fully evaluated below. Skull and upper cervical spine: Unremarkable bone marrow signal. Sinuses/Orbits: Bilateral cataract extraction. Paranasal sinuses and mastoid air cells are clear. Other: Partially visualized endotracheal and enteric tubes. MRA HEAD FINDINGS The distal vertebral arteries are diminutive with diminished flow most notable in the distal right V3 and V4 segments and with bilateral proximal vertebral artery occlusion shown on yesterday's neck CTA. Patent bilateral PICA, right AICA, and bilateral SCA origins are identified. There is a chronic lack of visualization of the mid basilar artery which  appears patent more proximally and distally, and this may reflect segmental atherosclerotic occlusion or a developmental variant. There are large posterior communicating arteries bilaterally with absence or severe hypoplasia of the right P1 segment. Both PCAs are patent without evidence of significant proximal stenosis. The internal carotid arteries are patent from skull base to carotid termini with extensive bilateral cavernous segment atherosclerotic irregularity resulting in mild stenosis on the right and moderate stenosis on the left. 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 ACAs and MCAs are patent with branch vessel irregularity but no evidence of proximal branch occlusion. The left MCA trifurcation remains patent following thrombectomy although there is moderate stenosis of the inferior and mid M2 origins. There also mild proximal left M1 and  left A1 stenoses. IMPRESSION: 1. Small to moderate-sized acute left MCA infarct. 2. Additional predominantly subcentimeter acute infarcts in the cerebral and cerebellar hemispheres bilaterally. 3. 4 mm acute hemorrhage in the right precentral gyrus as seen on CT yesterday. 4. Progressive chronic small vessel ischemia since 2018 with increased number of chronic microhemorrhages and chronic lacunar infarcts. 5. Advanced intracranial atherosclerosis. Revascularized left MCA bifurcation remains patent with moderate M2 origin stenoses. 6. Moderate ICA stenoses. 7. Diminished flow in the distal vertebral arteries with bilateral proximal occlusion shown on prior CTA. 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 8. 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 Electronically Signed   By: Logan Bores M.D.   On: 03/09/2018 17:17    Ir Angiogram Extremity Bilateral  Result Date: 04/06/2018 INDICATION: 73 year old female with a history of acute left MCA stroke. She presents within time frame for IV tPA, and is a candidate for mechanical thrombectomy EXAM: ULTRASOUND GUIDED ACCESS RIGHT COMMON FEMORAL ARTERY ULTRASOUND GUIDED ACCESS LEFT COMMON FEMORAL ARTERY FOR ARTERIAL MONITORING ANGIOGRAM RIGHT LOWER EXTREMITY BALLOON ANGIOPLASTY STENOSIS RIGHT COMMON ILIAC ARTERY IN ORDER TO PASS 8 FRENCH SHEATH TO COMPLETE THE THROMBECTOMY CEREBRAL ANGIOGRAM MECHANICAL THROMBECTOMY LEFT MCA EMERGENT LARGE VESSEL OCCLUSION DEPLOYMENT OF ANGIO-SEAL RIGHT COMMON FEMORAL ARTERY COMPARISON:  CT imaging of the same day MEDICATIONS: 2 g Ancef IV. The antibiotic was administered within 1 hour of the procedure ANESTHESIA/SEDATION: General endotracheal tube anesthesia CONTRAST:  96 cc Isovue-300 FLUOROSCOPY TIME:  Fluoroscopy Time: 26 minutes 24 seconds (1,362 mGy). COMPLICATIONS: None TECHNIQUE: Informed written consent was obtained from the patient's family after a thorough discussion of the procedural risks, benefits and alternatives. Specific risks discussed include: Bleeding, infection, contrast reaction, kidney injury/failure, need for further procedure/surgery, arterial injury or dissection, embolization to new territory, intracranial hemorrhage (10-15% risk), neurologic deterioration, cardiopulmonary collapse, death. All questions were addressed. Maximal Sterile Barrier Technique was utilized including during the procedure including caps, mask, sterile gowns, sterile gloves, sterile drape, hand hygiene and skin antiseptic. A timeout was performed prior to the initiation of the procedure. The anesthesia team was present to provide general endotracheal tube anesthesia and for patient monitoring during the procedure. Interventional neuro radiology nursing staff was also present. FINDINGS: Initial Findings: Left common carotid artery: No significant stenosis  of the left common carotid artery, with a unremarkable course caliber and contour. Surgical changes of prior endarterectomy evident on prior CT imaging. Left external carotid artery: Patent with antegrade flow. Left internal carotid artery: Normal course caliber and contour of the cervical portion. Vertical and petrous segment patent with normal course caliber contour. Cavernous segment patent. Clinoid segment patent. Antegrade flow of the ophthalmic artery. Ophthalmic segment patent. Terminus patent. Left MCA: Distal M1 occluded at the initiation of the case. There is partial filling of an inferior branch which is the non dominant branch to the frontal. Patent fetal PCA to the left P2 segment. Left ACA: A 1 segment patent. A 2 segment perfuses the right territory. Completion Findings: Left MCA: After 2 passes of mechanical thrombectomy plus aspiration, there is near complete restoration of flow through the left MCA territory, with slow flow through an M3/M4 branch of the parietal region compatible with TI CI 2b flow. 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 Physical exam At the initiation of the case, palpable bilateral common femoral artery pulses present No palpable distal pulses Doppler positive pulses  of the right posterior tibial and anterior tibial (with systolic blood pressures above 200) Doppler positive pulse at the left posterior tibial (with systolic blood pressure above 200), with no Doppler signal at the left anterior tibial At completion of case Doppler positive pulses were only discovered on the right posterior tibial and anterior tibial arteries with blood pressures above 200. In the 140-160 range (goal) Doppler signal was not evident A completion of case Doppler positive pulses were only discovered on the left at the posterior tibial and not the left anterior tibial (with the pressure above 595 systolic). PROCEDURE: Patient is brought emergently to the  neuro angiography suite, with the patient identified appropriately and placed supine position on the table. Left radial arterial line was attempted by the anesthesia team. The patient is then prepped and draped in the usual sterile fashion. Ultrasound survey of the right inguinal region was performed with images stored and sent to PACs. 11 blade scalpel was used to make a small incision. Blunt dissection was performed. A micropuncture needle was used access the right common femoral artery under ultrasound. With excellent arterial blood flow returned, an .018 micro wire was passed through the needle, observed to enter the abdominal aorta under fluoroscopy. The needle was removed, and a micropuncture sheath was placed over the wire. The inner dilator and wire were removed, and an 035 Bentson wire was advanced under fluoroscopy into the abdominal aorta. The sheath was removed and a standard 5 [place] vascular sheath was placed. The dilator was removed and the sheath was flushed. Ultrasound survey of the left inguinal region was then performed with images stored and sent to PACs, confirming patency of the vessel. A micropuncture needle was used access the left common femoral artery under ultrasound. With excellent arterial blood flow returned, and an .018 micro wire was passed through the needle, observed enter the abdominal aorta under fluoroscopy. The needle was removed, and a micropuncture sheath was placed over the wire. The inner dilator and wire were removed, and an 035 Bentson wire was advanced under fluoroscopy into the abdominal aorta. The sheath was removed and a standard 4 [place] vascular sheath was placed for arterial monitoring. The dilator was removed and the sheath was flushed. A 36F JB-1 diagnostic catheter was then advanced over the wire through the existing right femoral access to the proximal descending thoracic aorta. Wire was then removed. Double flush of the catheter was performed. Catheter was then  used to select the left cervical internal carotid artery. Formal angiogram was performed, with roadmap achieved. Exchange length Rosen wire was then passed through the diagnostic catheter to the distal cervical ICA and the diagnostic catheter was removed. The 5 French sheath was removed, with attempt at placing 8 French 55 cm bright tip sheath. Significant resistance was encountered with passing the sheath through the right iliac system. The sheath would not advanced through the pre-existing right common iliac artery stent. Sheath was withdrawn and rotated. We attempted to advance the sheath over the introducer which was pinned on the wire. We also removed the introducer in place to diagnostic catheter into the aorta and attempted to pass the sheath over the diagnostic catheter. None of these maneuvers were successful. Sheath was then withdrawn into the external iliac artery. Balloon angioplasty was performed of the pre-existing right common iliac artery stent with 5 mm x 40 mm standard balloon angioplasty. Balloon was removed and the introducer was then replaced. Eight French sheath was then again attempted to pass over the  wire, unsuccessful through this segment of the iliac artery. The introducer was removed, diagnostic catheter was placed into the cervical segment of the internal carotid artery, and the Rose an wire was exchanged for a 260 cm Amplatz wire. With the stiff wire in place, another attempt was made to pass the sheath which was unsuccessful. Sheath was then again withdrawn to the external iliac artery, introducer was removed, and a second balloon angioplasty 6 mm x 40 mm was performed at the pre-existing stent. The sheath was then successfully advanced over the balloon as the balloon was deflated, into the abdominal aorta. The introducer was again placed after removing the balloon and the sheath was advanced 6 successfully into the aorta. Introducer was withdrawn. Once the sheath was advanced over the  wire to the thoracic aorta, sheath was flushed and attached to pressurized and heparinized saline bag for constant forward flow. Eight French 95 cm flow gate balloon catheter was then advanced over the wire to the distal cervical segment. Wire was removed. Then a coaxial intermediate catheter and microcatheter combination was prepared on the back table. This combination was penumbra Ace 64 catheter and a Trevo Provue18 microcatheter, with a synchro soft wire. This combination was then advanced through the balloon guide into the ICA. System was advanced into the internal carotid artery, to the level of the occlusion. The micro wire was then carefully advanced through the occluded segment, with a distal knuckle configuration. Microcatheter was then pushed through the occluded segment and the wire was removed. Intermediate catheter was advanced over the micro wire to the carotid siphon, which would not advance beyond the ophthalmic segment through the terminus. Blood was then aspirated through the hub of the microcatheter, and a gentle contrast injection was performed confirming intraluminal position. A rotating hemostatic valve was then attached to the back end of the microcatheter, and a pressurized and heparinized saline bag was attached to the catheter. 4 mm x 40 mm solitaire device was then selected. Back flush was achieved at the rotating hemostatic valve, and then the device was gently advanced through the microcatheter to the distal end. The retriever was then unsheathed by withdrawing the microcatheter under fluoroscopy. Once the retriever was completely unsheathed, the microcatheter was carefully stripped from the delivery wire of the device. Control angiogram was then performed from the balloon catheter. 3 minute time interval was observed. The balloon on the balloon guide was then inflated to profile of the vessel. Constant aspiration was then performed through the intermediate catheter, and constant gentle  aspiration was performed at the balloon guide. This aspiration was continued as the retriever was gently and slowly withdrawn with fluoroscopic observation into the distal intermediate catheter. The entire system was then gently withdrawn from the intracranial ICA and into the balloon guide. Once the retriever was entirely removed from the system, free aspiration was confirmed at the hub of the balloon guide, with free blood return confirmed. Control angiogram was then performed. TICI 2a flow was achieved. Repeat angiogram with multiple angulation demonstrated persistent occlusion of parietal branch. The same coaxial system was again passed through the balloon guide catheter. The microcatheter system was then advanced through the occlusion of the parietal branch. Once the micro wire microcatheter were beyond the occlusion, the micro wire was removed and stentreiver device was deployed, stripping the microcatheter from the wire. After the device was deployed across the occlusion, the intermediate catheter was again attempted to advanced to the M1 segment. This was unsuccessful, and would not advance  beyond the ophthalmic segment. The balloon at the balloon guide was inflated to profile of the vessel. Local aspiration was performed at the intermediate catheter upon withdrawal of the device under fluoroscopic observation, with aspiration at the balloon guide. Once the retrieve her and intermediate catheter were entirely removed from the system, free aspiration was confirmed at the hub of the intermediate catheter, with free blood return confirmed. Control angiogram was again performed. Improved flow was confirmed, TICI 2b. Balloon guide was withdrawn and angiogram of the cervical segment was performed. We then removed the balloon guide catheter and deployed an 8 French Angio-Seal at the right common femoral artery access. Angiogram was performed of the right sided access and the left-sided access before completing the  case. Patient tolerated the procedure well and remained hemodynamically stable throughout. No complications were encountered. Estimated blood loss approximately 50 cc. IMPRESSION: Status post ultrasound guided access right common femoral artery for cerebral angiogram and a combination of aspiration and mechanical thrombectomy of left M1 emergent large vessel occlusion, and restoration of TICI 2b flow after 2 passes. Status post balloon angioplasty to 6 mm of proximal right common iliac artery for treatment of a restrictive lesion in order to pass 8 French sheath to perform the thrombectomy. Status post deployment of Angio-Seal at the right common femoral artery. At the conclusion of the case, a 4 [place] sheath remains in left common femoral artery for arterial monitoring. Signed, Dulcy Fanny. Dellia Nims, RPVI [facility] Inter[facility] Radiology [facility] Radiology PLAN: Formal CT after the case Right hip straight overnight status post 8 French device and Angio-Seal closure Left common femoral artery for French sheath may be removed when hemodynamic monitoring is no longer needed with manual pressure. Goal blood pressure 283 [id number] systolic given the incomplete flow restoration Electronically Signed   By: Corrie Mckusick D.O.   On: 03/10/2018 20:39   Ir US Guide Vasc Access Right  Result Date: 04/03/2018 INDICATION: 73 year old female with a history of acute left MCA stroke. She presents within time frame for IV tPA, and is a candidate for mechanical thrombectomy EXAM: ULTRASOUND GUIDED ACCESS RIGHT COMMON FEMORAL ARTERY ULTRASOUND GUIDED ACCESS LEFT COMMON FEMORAL ARTERY FOR ARTERIAL MONITORING ANGIOGRAM RIGHT LOWER EXTREMITY BALLOON ANGIOPLASTY STENOSIS RIGHT COMMON ILIAC ARTERY IN ORDER TO PASS 8 FRENCH SHEATH TO COMPLETE THE THROMBECTOMY CEREBRAL ANGIOGRAM MECHANICAL THROMBECTOMY LEFT MCA EMERGENT LARGE VESSEL OCCLUSION DEPLOYMENT OF ANGIO-SEAL RIGHT COMMON FEMORAL ARTERY COMPARISON:  CT imaging  of the same day MEDICATIONS: 2 g Ancef IV. The antibiotic was administered within 1 hour of the procedure ANESTHESIA/SEDATION: General endotracheal tube anesthesia CONTRAST:  96 cc Isovue-[id number] FLUOROSCOPY TIME:  Fluoroscopy Time: 26 minutes 24 seconds (1,362 mGy). COMPLICATIONS: None TECHNIQUE: Informed written consent was obtained from the patient's family after a thorough discussion of the procedural risks, benefits and alternatives. Specific risks discussed include: Bleeding, infection, contrast reaction, kidney injury/failure, need for further procedure/surgery, arterial injury or dissection, embolization to new territory, intracranial hemorrhage (10-15% risk), neurologic deterioration, cardiopulmonary collapse, death. All questions were addressed. Maximal Sterile Barrier Technique was utilized including during the procedure including caps, mask, sterile gowns, sterile gloves, sterile drape, hand hygiene and skin antiseptic. A timeout was performed prior to the initiation of the procedure. The anesthesia team was present to provide general endotracheal tube anesthesia and for patient monitoring during the procedure. Interventional neuro radiology nursing staff was also present. FINDINGS: Initial Findings: Left common carotid artery: No significant stenosis of the left common carotid artery, with a unremarkable course  caliber and contour. Surgical changes of prior endarterectomy evident on prior CT imaging. Left external carotid artery: Patent with antegrade flow. Left internal carotid artery: Normal course caliber and contour of the cervical portion. Vertical and petrous segment patent with normal course caliber contour. Cavernous segment patent. Clinoid segment patent. Antegrade flow of the ophthalmic artery. Ophthalmic segment patent. Terminus patent. Left MCA: Distal M1 occluded at the initiation of the case. There is partial filling of an inferior branch which is the non dominant branch to the frontal. Patent  fetal PCA to the left P2 segment. Left ACA: A 1 segment patent. A 2 segment perfuses the right territory. Completion Findings: Left MCA: After 2 passes of mechanical thrombectomy plus aspiration, there is near complete restoration of flow through the left MCA territory, with slow flow through an M3/M4 branch of the parietal region compatible with TI CI 2b flow. 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 Physical exam At the initiation of the case, palpable bilateral common femoral artery pulses present No palpable distal pulses Doppler positive pulses of the right posterior tibial and anterior tibial (with systolic blood pressures above 200) Doppler positive pulse at the left posterior tibial (with systolic blood pressure above 200), with no Doppler signal at the left anterior tibial At completion of case Doppler positive pulses were only discovered on the right posterior tibial and anterior tibial arteries with blood pressures above 200. In the 140-160 range (goal) Doppler signal was not evident A completion of case Doppler positive pulses were only discovered on the left at the posterior tibial and not the left anterior tibial (with the pressure above 220 systolic). PROCEDURE: Patient is brought emergently to the neuro angiography suite, with the patient identified appropriately and placed supine position on the table. Left radial arterial line was attempted by the anesthesia team. The patient is then prepped and draped in the usual sterile fashion. Ultrasound survey of the right inguinal region was performed with images stored and sent to PACs. 11 blade scalpel was used to make a small incision. Blunt dissection was performed. A micropuncture needle was used access the right common femoral artery under ultrasound. With excellent arterial blood flow returned, an .018 micro wire was passed through the needle, observed to enter the abdominal aorta under fluoroscopy. The needle  was removed, and a micropuncture sheath was placed over the wire. The inner dilator and wire were removed, and an 035 Bentson wire was advanced under fluoroscopy into the abdominal aorta. The sheath was removed and a standard 5 [place] vascular sheath was placed. The dilator was removed and the sheath was flushed. Ultrasound survey of the left inguinal region was then performed with images stored and sent to PACs, confirming patency of the vessel. A micropuncture needle was used access the left common femoral artery under ultrasound. With excellent arterial blood flow returned, and an .018 micro wire was passed through the needle, observed enter the abdominal aorta under fluoroscopy. The needle was removed, and a micropuncture sheath was placed over the wire. The inner dilator and wire were removed, and an 035 Bentson wire was advanced under fluoroscopy into the abdominal aorta. The sheath was removed and a standard 4 [place] vascular sheath was placed for arterial monitoring. The dilator was removed and the sheath was flushed. A 57F JB-1 diagnostic catheter was then advanced over the wire through the existing right femoral access to the proximal descending thoracic aorta. Wire was then removed. Double flush of the catheter was performed.  Catheter was then used to select the left cervical internal carotid artery. Formal angiogram was performed, with roadmap achieved. Exchange length Rosen wire was then passed through the diagnostic catheter to the distal cervical ICA and the diagnostic catheter was removed. The 5 French sheath was removed, with attempt at placing 8 French 55 cm bright tip sheath. Significant resistance was encountered with passing the sheath through the right iliac system. The sheath would not advanced through the pre-existing right common iliac artery stent. Sheath was withdrawn and rotated. We attempted to advance the sheath over the introducer which was pinned on the wire. We also removed the  introducer in place to diagnostic catheter into the aorta and attempted to pass the sheath over the diagnostic catheter. None of these maneuvers were successful. Sheath was then withdrawn into the external iliac artery. Balloon angioplasty was performed of the pre-existing right common iliac artery stent with 5 mm x 40 mm standard balloon angioplasty. Balloon was removed and the introducer was then replaced. Eight French sheath was then again attempted to pass over the wire, unsuccessful through this segment of the iliac artery. The introducer was removed, diagnostic catheter was placed into the cervical segment of the internal carotid artery, and the Rose an wire was exchanged for a 260 cm Amplatz wire. With the stiff wire in place, another attempt was made to pass the sheath which was unsuccessful. Sheath was then again withdrawn to the external iliac artery, introducer was removed, and a second balloon angioplasty 6 mm x 40 mm was performed at the pre-existing stent. The sheath was then successfully advanced over the balloon as the balloon was deflated, into the abdominal aorta. The introducer was again placed after removing the balloon and the sheath was advanced 6 successfully into the aorta. Introducer was withdrawn. Once the sheath was advanced over the wire to the thoracic aorta, sheath was flushed and attached to pressurized and heparinized saline bag for constant forward flow. Eight French 95 cm flow gate balloon catheter was then advanced over the wire to the distal cervical segment. Wire was removed. Then a coaxial intermediate catheter and microcatheter combination was prepared on the back table. This combination was penumbra Ace 64 catheter and a Trevo Provue18 microcatheter, with a synchro soft wire. This combination was then advanced through the balloon guide into the ICA. System was advanced into the internal carotid artery, to the level of the occlusion. The micro wire was then carefully advanced  through the occluded segment, with a distal knuckle configuration. Microcatheter was then pushed through the occluded segment and the wire was removed. Intermediate catheter was advanced over the micro wire to the carotid siphon, which would not advance beyond the ophthalmic segment through the terminus. Blood was then aspirated through the hub of the microcatheter, and a gentle contrast injection was performed confirming intraluminal position. A rotating hemostatic valve was then attached to the back end of the microcatheter, and a pressurized and heparinized saline bag was attached to the catheter. 4 mm x 40 mm solitaire device was then selected. Back flush was achieved at the rotating hemostatic valve, and then the device was gently advanced through the microcatheter to the distal end. The retriever was then unsheathed by withdrawing the microcatheter under fluoroscopy. Once the retriever was completely unsheathed, the microcatheter was carefully stripped from the delivery wire of the device. Control angiogram was then performed from the balloon catheter. 3 minute time interval was observed. The balloon on the balloon guide was then inflated  to profile of the vessel. Constant aspiration was then performed through the intermediate catheter, and constant gentle aspiration was performed at the balloon guide. This aspiration was continued as the retriever was gently and slowly withdrawn with fluoroscopic observation into the distal intermediate catheter. The entire system was then gently withdrawn from the intracranial ICA and into the balloon guide. Once the retriever was entirely removed from the system, free aspiration was confirmed at the hub of the balloon guide, with free blood return confirmed. Control angiogram was then performed. TICI 2a flow was achieved. Repeat angiogram with multiple angulation demonstrated persistent occlusion of parietal branch. The same coaxial system was again passed through the  balloon guide catheter. The microcatheter system was then advanced through the occlusion of the parietal branch. Once the micro wire microcatheter were beyond the occlusion, the micro wire was removed and stentreiver device was deployed, stripping the microcatheter from the wire. After the device was deployed across the occlusion, the intermediate catheter was again attempted to advanced to the M1 segment. This was unsuccessful, and would not advance beyond the ophthalmic segment. The balloon at the balloon guide was inflated to profile of the vessel. Local aspiration was performed at the intermediate catheter upon withdrawal of the device under fluoroscopic observation, with aspiration at the balloon guide. Once the retrieve her and intermediate catheter were entirely removed from the system, free aspiration was confirmed at the hub of the intermediate catheter, with free blood return confirmed. Control angiogram was again performed. Improved flow was confirmed, TICI 2b. Balloon guide was withdrawn and angiogram of the cervical segment was performed. We then removed the balloon guide catheter and deployed an 8 French Angio-Seal at the right common femoral artery access. Angiogram was performed of the right sided access and the left-sided access before completing the case. Patient tolerated the procedure well and remained hemodynamically stable throughout. No complications were encountered. Estimated blood loss approximately 50 cc. IMPRESSION: Status post ultrasound guided access right common femoral artery for cerebral angiogram and a combination of aspiration and mechanical thrombectomy of left M1 emergent large vessel occlusion, and restoration of TICI 2b flow after 2 passes. Status post balloon angioplasty to 6 mm of proximal right common iliac artery for treatment of a restrictive lesion in order to pass 8 French sheath to perform the thrombectomy. Status post deployment of Angio-Seal at the right common  femoral artery. At the conclusion of the case, a 4 [place] sheath remains in left common femoral artery for arterial monitoring. Signed, Dulcy Fanny. Dellia Nims, RPVI [facility] and Interventional Radiology Specialists [facility] Radiology PLAN: Formal CT after the case Right hip straight overnight status post 8 French device and Angio-Seal closure Left common femoral artery for French sheath may be removed when hemodynamic monitoring is no longer needed with manual pressure. Goal blood pressure [id number] [id number] systolic given the incomplete flow restoration Electronically Signed   By: Corrie Mckusick D.O.   On: 03/23/2018 20:39   Dg Chest Port 1 View  Result Date: 2018/04/06 CLINICAL DATA:  Acute respiratory failure EXAM: PORTABLE CHEST 1 VIEW COMPARISON:  03/16/2018 FINDINGS: Endotracheal tube tip measures 3.4 cm above the carina. Enteric tube tip is off the field of view but below the left hemidiaphragm. Right central venous catheter with tip over the mid SVC region. No pneumothorax. Shallow inspiration. Cardiac enlargement. Opacity in the right lung base could represent atelectasis, consolidation, or layering pleural effusion. Mild progression since previous study. Left lung is clear. Calcification of the aorta. Degenerative changes  in the spine and shoulders. IMPRESSION: Appliances appear in satisfactory location. Cardiac enlargement. Increasing opacity in the right lung base may represent atelectasis, consolidation, or layering pleural effusion. Electronically Signed   By: Lucienne Capers M.D.   On: Apr 09, 2018 20:11   Dg Chest Port 1 View  Result Date: 03/16/2018 CLINICAL DATA:  Acute respiratory failure EXAM: PORTABLE CHEST 1 VIEW COMPARISON:  03/13/2018 FINDINGS: Endotracheal tube is 1.5 cm above the carina. Right dialysis catheter and NG tube are unchanged. Cardiomegaly with vascular congestion and hazy perihilar and lower lobe airspace opacities, favor edema. No real change since prior study. IMPRESSION: No  significant change. Electronically Signed   By: Rolm Baptise M.D.   On: 03/16/2018 11:36   Dg Chest Port 1 View  Result Date: 03/13/2018 CLINICAL DATA:  Central line placement EXAM: PORTABLE CHEST 1 VIEW COMPARISON:  Chest radiograph from earlier today. FINDINGS: Endotracheal tube tip is 1.8 cm above the carina. Enteric tube enters stomach with the tip not seen on this image. Right internal jugular central venous catheter terminates in the middle third of the SVC. Stable cardiomediastinal silhouette with mild cardiomegaly. No pneumothorax. No pleural effusion. Hazy parahilar lung opacities are stable. IMPRESSION: 1. Right internal jugular central venous catheter terminates in the middle third of the SVC. No pneumothorax. 2. Endotracheal tube tip 1.8 cm above the carina. 3. Stable mild cardiomegaly. Stable hazy parahilar lung opacities, favor pulmonary edema. Electronically Signed   By: Ilona Sorrel M.D.   On: 03/13/2018 15:14   Dg Chest Port 1 View  Result Date: 03/13/2018 CLINICAL DATA:  Endotracheal tube EXAM: PORTABLE CHEST 1 VIEW COMPARISON:  03/12/2018 FINDINGS: The heart is moderately enlarged. NG tube is stable. Endotracheal tube has been retracted and the tip is now 3.2 cm from the carina. There is hazy pulmonary opacity bilaterally which is central and basilar likely combination of pleural fluid and airspace disease. No pneumothorax. Vascular congestion. IMPRESSION: Stable bilateral pleural effusions and bilateral hazy airspace disease. Electronically Signed   By: Marybelle Killings M.D.   On: 03/13/2018 07:47   Dg Chest Port 1 View  Result Date: 03/12/2018 CLINICAL DATA:  Endotracheal tube placement. EXAM: PORTABLE CHEST 1 VIEW COMPARISON:  Chest radiograph performed 03/11/2018 FINDINGS: The patient's endotracheal tube is seen ending 1-2 cm above the carina. This could be retracted 2 cm. The patient's enteric tube is noted extending below the diaphragm. Small bilateral pleural effusions are noted. Hazy  bilateral airspace opacities raise concern for pulmonary edema. No pneumothorax is seen. The cardiomediastinal silhouette is mildly enlarged. No acute osseous abnormalities are identified. IMPRESSION: 1. Endotracheal tube seen ending 1-2 cm above the carina. This could be retracted 2 cm. 2. Small bilateral pleural effusions noted. Hazy bilateral airspace opacities raise concern for pulmonary edema. 3. Mild cardiomegaly. Electronically Signed   By: Garald Balding M.D.   On: 03/12/2018 06:38   Dg Chest Port 1 View  Result Date: 03/11/2018 CLINICAL DATA:  Acute respiratory failure with hypoxia. EXAM: PORTABLE CHEST 1 VIEW COMPARISON:  03/10/2018 FINDINGS: Endotracheal tube has tip 5.6 cm above the carina. Nasogastric tube unchanged with tip over the stomach in the left upper quadrant. Lungs are adequately inflated with minimal persistent left retrocardiac opacification. Mild stable cardiomegaly. Remainder of the exam is unchanged. IMPRESSION: Mild left retrocardiac opacification unchanged likely atelectasis. Mild stable cardiomegaly. Tubes and lines as described. Electronically Signed   By: Marin Olp M.D.   On: 03/11/2018 08:17   Dg Chest [facility] 1 View  Result  Date: 03/10/2018 CLINICAL DATA:  Cerebral infarction and respiratory failure. EXAM: PORTABLE CHEST 1 VIEW COMPARISON:  03/14/2018 FINDINGS: Endotracheal tube remains present with the tip approximately 3 cm above the carina. Gastric decompression tube has been advanced and now extends further into the stomach. Stable significant cardiac enlargement. Lungs show some improved aeration at the right base with mild residual right basilar atelectasis remaining. Left lower lobe atelectasis is relatively stable. No pulmonary edema, significant pleural fluid or pneumothorax. IMPRESSION: Advancement of gastric decompression tube further into the stomach. Improved aeration at the right lung base with residual mild right basilar atelectasis. Stable left lower lobe  atelectasis. Electronically Signed   By: Aletta Edouard M.D.   On: 03/10/2018 08:18   Portable Chest X-ray  Result Date: 03/20/2018 CLINICAL DATA:  Intubated EXAM: PORTABLE CHEST 1 VIEW COMPARISON:  06/01/2017 chest radiograph. FINDINGS: Endotracheal tube tip is 4.2 cm above the carina. Enteric tube terminates at the esophagogastric junction with the side port in the lower thoracic esophagus. Stable cardiomediastinal silhouette with moderate cardiomegaly. No pneumothorax. No pleural effusion. Hazy lower parahilar lung opacities, most prominent in the right lower lung, worsened. IMPRESSION: 1. Well-positioned endotracheal tube. 2. Enteric tube terminates at the esophagogastric junction with the side port in the lower thoracic esophagus, consider advancing 8-10 cm. 3. Moderate cardiomegaly. Worsening hazy lower parahilar lung opacities, most prominent in the right lower lung, favor pulmonary edema. Electronically Signed   By: Ilona Sorrel M.D.   On: 03/29/2018 22:12   Ir Percutaneous Art Thrombectomy/infusion Intracranial Inc Diag Angio  Result Date: 03/28/2018 INDICATION: 73 year old female with a history of acute left MCA stroke. She presents within time frame for IV tPA, and is a candidate for mechanical thrombectomy EXAM: ULTRASOUND GUIDED ACCESS RIGHT COMMON FEMORAL ARTERY ULTRASOUND GUIDED ACCESS LEFT COMMON FEMORAL ARTERY FOR ARTERIAL MONITORING ANGIOGRAM RIGHT LOWER EXTREMITY BALLOON ANGIOPLASTY STENOSIS RIGHT COMMON ILIAC ARTERY IN ORDER TO PASS 8 FRENCH SHEATH TO COMPLETE THE THROMBECTOMY CEREBRAL ANGIOGRAM MECHANICAL THROMBECTOMY LEFT MCA EMERGENT LARGE VESSEL OCCLUSION DEPLOYMENT OF ANGIO-SEAL RIGHT COMMON FEMORAL ARTERY COMPARISON:  CT imaging of the same day MEDICATIONS: 2 g Ancef IV. The antibiotic was administered within 1 hour of the procedure ANESTHESIA/SEDATION: General endotracheal tube anesthesia CONTRAST:  96 cc Isovue-300 FLUOROSCOPY TIME:  Fluoroscopy Time: 26 minutes 24 seconds (1,362  mGy). COMPLICATIONS: None TECHNIQUE: Informed written consent was obtained from the patient's family after a thorough discussion of the procedural risks, benefits and alternatives. Specific risks discussed include: Bleeding, infection, contrast reaction, kidney injury/failure, need for further procedure/surgery, arterial injury or dissection, embolization to new territory, intracranial hemorrhage (10-15% risk), neurologic deterioration, cardiopulmonary collapse, death. All questions were addressed. Maximal Sterile Barrier Technique was utilized including during the procedure including caps, mask, sterile gowns, sterile gloves, sterile drape, hand hygiene and skin antiseptic. A timeout was performed prior to the initiation of the procedure. The anesthesia team was present to provide general endotracheal tube anesthesia and for patient monitoring during the procedure. Interventional neuro radiology nursing staff was also present. 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 Surgical changes of prior endarterectomy evident on prior CT imaging. Left external carotid artery: Patent with antegrade flow. Left internal carotid artery: Normal course caliber and contour of the cervical portion. Vertical and petrous segment patent with normal course caliber contour. Cavernous segment patent. Clinoid segment patent. Antegrade flow of the ophthalmic artery. Ophthalmic segment patent. Terminus patent. Left MCA: Distal  M1 occluded at the initiation of the case. There is partial filling of an inferior branch which is the non dominant branch to the frontal. Patent fetal PCA to the left P2 segment. Left ACA: A 1 segment patent. A 2 segment perfuses the right territory. Completion Findings: Left MCA: After 2 passes of mechanical thrombectomy plus aspiration, there is near complete restoration of flow through the left  MCA territory, with slow flow through an M3/M4 branch of the parietal region compatible with TI CI 2b flow. 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 Physical exam At the initiation of the case, palpable bilateral common femoral artery pulses present No palpable distal pulses Doppler positive pulses of the right posterior tibial and anterior tibial (with systolic blood pressures above 200) Doppler positive pulse at the left posterior tibial (with systolic blood pressure above 200), with no Doppler signal at the left anterior tibial At completion of case Doppler positive pulses were only discovered on the right posterior tibial and anterior tibial arteries with blood pressures above 200. In the 140-160 range (goal) Doppler signal was not evident A completion of case Doppler positive pulses were only discovered on the left at the posterior tibial and not the left anterior tibial (with the pressure above 643 systolic). PROCEDURE: Patient is brought emergently to the neuro angiography suite, with the patient identified appropriately and placed supine position on the table. Left radial arterial line was attempted by the anesthesia team. The patient is then prepped and draped in the usual sterile fashion. Ultrasound survey of the right inguinal region was performed with images stored and sent to PACs. 11 blade scalpel was used to make a small incision. Blunt dissection was performed. A micropuncture needle was used access the right common femoral artery under ultrasound. With excellent arterial blood flow returned, an .018 micro wire was passed through the needle, observed to enter the abdominal aorta under fluoroscopy. The needle was removed, and a micropuncture sheath was placed over the wire. The inner dilator and wire were removed, and an 035 Bentson wire was advanced under fluoroscopy into the abdominal aorta. The sheath was removed and a standard 5 [place] vascular sheath was  placed. The dilator was removed and the sheath was flushed. Ultrasound survey of the left inguinal region was then performed with images stored and sent to PACs, confirming patency of the vessel. A micropuncture needle was used access the left common femoral artery under ultrasound. With excellent arterial blood flow returned, and an .018 micro wire was passed through the needle, observed enter the abdominal aorta under fluoroscopy. The needle was removed, and a micropuncture sheath was placed over the wire. The inner dilator and wire were removed, and an 035 Bentson wire was advanced under fluoroscopy into the abdominal aorta. The sheath was removed and a standard 4 [place] vascular sheath was placed for arterial monitoring. The dilator was removed and the sheath was flushed. A 34F JB-1 diagnostic catheter was then advanced over the wire through the existing right femoral access to the proximal descending thoracic aorta. Wire was then removed. Double flush of the catheter was performed. Catheter was then used to select the left cervical internal carotid artery. Formal angiogram was performed, with roadmap achieved. Exchange length Rosen wire was then passed through the diagnostic catheter to the distal cervical ICA and the diagnostic catheter was removed. The 5 French sheath was removed, with attempt at placing 8 French 55 cm bright tip sheath. Significant resistance was encountered with passing  the sheath through the right iliac system. The sheath would not advanced through the pre-existing right common iliac artery stent. Sheath was withdrawn and rotated. We attempted to advance the sheath over the introducer which was pinned on the wire. We also removed the introducer in place to diagnostic catheter into the aorta and attempted to pass the sheath over the diagnostic catheter. None of these maneuvers were successful. Sheath was then withdrawn into the external iliac artery. Balloon angioplasty was performed of the  pre-existing right common iliac artery stent with 5 mm x 40 mm standard balloon angioplasty. Balloon was removed and the introducer was then replaced. Eight French sheath was then again attempted to pass over the wire, unsuccessful through this segment of the iliac artery. The introducer was removed, diagnostic catheter was placed into the cervical segment of the internal carotid artery, and the Rose an wire was exchanged for a 260 cm Amplatz wire. With the stiff wire in place, another attempt was made to pass the sheath which was unsuccessful. Sheath was then again withdrawn to the external iliac artery, introducer was removed, and a second balloon angioplasty 6 mm x 40 mm was performed at the pre-existing stent. The sheath was then successfully advanced over the balloon as the balloon was deflated, into the abdominal aorta. The introducer was again placed after removing the balloon and the sheath was advanced 6 successfully into the aorta. Introducer was withdrawn. Once the sheath was advanced over the wire to the thoracic aorta, sheath was flushed and attached to pressurized and heparinized saline bag for constant forward flow. Eight French 95 cm flow gate balloon catheter was then advanced over the wire to the distal cervical segment. Wire was removed. Then a coaxial intermediate catheter and microcatheter combination was prepared on the back table. This combination was penumbra Ace 64 catheter and a Trevo Provue18 microcatheter, with a synchro soft wire. This combination was then advanced through the balloon guide into the ICA. System was advanced into the internal carotid artery, to the level of the occlusion. The micro wire was then carefully advanced through the occluded segment, with a distal knuckle configuration. Microcatheter was then pushed through the occluded segment and the wire was removed. Intermediate catheter was advanced over the micro wire to the carotid siphon, which would not advance beyond  the ophthalmic segment through the terminus. Blood was then aspirated through the hub of the microcatheter, and a gentle contrast injection was performed confirming intraluminal position. A rotating hemostatic valve was then attached to the back end of the microcatheter, and a pressurized and heparinized saline bag was attached to the catheter. 4 mm x 40 mm solitaire device was then selected. Back flush was achieved at the rotating hemostatic valve, and then the device was gently advanced through the microcatheter to the distal end. The retriever was then unsheathed by withdrawing the microcatheter under fluoroscopy. Once the retriever was completely unsheathed, the microcatheter was carefully stripped from the delivery wire of the device. Control angiogram was then performed from the balloon catheter. 3 minute time interval was observed. The balloon on the balloon guide was then inflated to profile of the vessel. Constant aspiration was then performed through the intermediate catheter, and constant gentle aspiration was performed at the balloon guide. This aspiration was continued as the retriever was gently and slowly withdrawn with fluoroscopic observation into the distal intermediate catheter. The entire system was then gently withdrawn from the intracranial ICA and into the balloon guide. Once the retriever was  entirely removed from the system, free aspiration was confirmed at the hub of the balloon guide, with free blood return confirmed. Control angiogram was then performed. TICI 2a flow was achieved. Repeat angiogram with multiple angulation demonstrated persistent occlusion of parietal branch. The same coaxial system was again passed through the balloon guide catheter. The microcatheter system was then advanced through the occlusion of the parietal branch. Once the micro wire microcatheter were beyond the occlusion, the micro wire was removed and stentreiver device was deployed, stripping the microcatheter  from the wire. After the device was deployed across the occlusion, the intermediate catheter was again attempted to advanced to the M1 segment. This was unsuccessful, and would not advance beyond the ophthalmic segment. The balloon at the balloon guide was inflated to profile of the vessel. Local aspiration was performed at the intermediate catheter upon withdrawal of the device under fluoroscopic observation, with aspiration at the balloon guide. Once the retrieve her and intermediate catheter were entirely removed from the system, free aspiration was confirmed at the hub of the intermediate catheter, with free blood return confirmed. Control angiogram was again performed. Improved flow was confirmed, TICI 2b. Balloon guide was withdrawn and angiogram of the cervical segment was performed. We then removed the balloon guide catheter and deployed an 8 French Angio-Seal at the right common femoral artery access. Angiogram was performed of the right sided access and the left-sided access before completing the case. Patient tolerated the procedure well and remained hemodynamically stable throughout. No complications were encountered. Estimated blood loss approximately 50 cc. IMPRESSION: Status post ultrasound guided access right common femoral artery for cerebral angiogram and a combination of aspiration and mechanical thrombectomy of left M1 emergent large vessel occlusion, and restoration of TICI 2b flow after 2 passes. Status post balloon angioplasty to 6 mm of proximal right common iliac artery for treatment of a restrictive lesion in order to pass 8 French sheath to perform the thrombectomy. Status post deployment of Angio-Seal at the right common femoral artery. At the conclusion of the case, a 4 [place] sheath remains in left common femoral artery for arterial monitoring. Signed, Dulcy Fanny. Dellia Nims, RPVI [facility] Radiology PLAN: Formal CT after the case Right  hip straight overnight status post 8 French device and Angio-Seal closure Left common femoral artery for French sheath may be removed when hemodynamic monitoring is no longer needed with manual pressure. Goal blood pressure 751 [id number] systolic given the incomplete flow restoration Electronically Signed   By: Corrie Mckusick D.O.   On: 04/01/2018 20:39   Ct Head Code Stroke Wo Contrast  Result Date: 03/14/2018 CLINICAL DATA:  Code stroke.  Deficit not specified EXAM: CT HEAD WITHOUT CONTRAST TECHNIQUE: Contiguous axial images were obtained from the base of the skull through the vertex without intravenous contrast. COMPARISON:  Head CT 07/18/2016 and brain MRI 07/20/2016 FINDINGS: Brain: Multiple remote lacunar infarcts in the deep white matter and deep gray nuclei, chronic when compared to prior head CT and brain MRI. Small remote left cerebellar infarct. No evidence of acute infarct. No hemorrhage or hydrocephalus. Vascular: No hyperdense vessel. Skull: Normal. Negative for fracture or focal lesion. Sinuses/Orbits: No acute finding. Other: These results were communicated to Dr. Rory Percy at 4:43 pmon 1/1/2020by text page via the [facility] messaging system. ASPECTS [facility] Stroke Program Early CT Score) Not scored without localizing symptoms. Motion artifact at the vertex blurring cortex. IMPRESSION: 1. No acute finding. 2. Motion artifact at the vertex. 3. Multiple  remote small vessel infarcts. Electronically Signed   By: Monte Fantasia M.D.   On: 03/19/2018 16:45   Vas [place] Lower Extremity Venous (dvt)  Result Date: 03/09/2018  Lower Venous Study Indications: Stroke.  Limitations: Body habitus. Performing Technologist: Maudry Mayhew MHA, RDMS, RVT, RDCS  Examination Guidelines: A complete evaluation includes B-mode imaging, spectral Doppler, color Doppler, and power Doppler as needed of all accessible portions of each vessel. Bilateral testing is considered an integral part of a complete examination. Limited  examinations for reoccurring indications may be performed as noted.  Right Venous Findings: +---------+---------------+---------+-----------+----------+--------------+          CompressibilityPhasicitySpontaneityPropertiesSummary        +---------+---------------+---------+-----------+----------+--------------+ CFV                                                   Not visualized +---------+---------------+---------+-----------+----------+--------------+ SFJ                                                   Not visualized +---------+---------------+---------+-----------+----------+--------------+ FV Prox  Full                                                        +---------+---------------+---------+-----------+----------+--------------+ FV Mid   Full                                                        +---------+---------------+---------+-----------+----------+--------------+ FV DistalFull                                                        +---------+---------------+---------+-----------+----------+--------------+ PFV      Full                                                        +---------+---------------+---------+-----------+----------+--------------+ POP      Full           Yes      Yes                                 +---------+---------------+---------+-----------+----------+--------------+ PTV      Full                                                        +---------+---------------+---------+-----------+----------+--------------+ PERO  Not visualized +---------+---------------+---------+-----------+----------+--------------+  Left Venous Findings: +---------+---------------+---------+-----------+----------+-------+          CompressibilityPhasicitySpontaneityPropertiesSummary +---------+---------------+---------+-----------+----------+-------+ CFV      Full           Yes       Yes                          +---------+---------------+---------+-----------+----------+-------+ SFJ      Full                                                 +---------+---------------+---------+-----------+----------+-------+ FV Prox  Full                                                 +---------+---------------+---------+-----------+----------+-------+ FV Mid   Full                                                 +---------+---------------+---------+-----------+----------+-------+ FV DistalFull                                                 +---------+---------------+---------+-----------+----------+-------+ PFV      Full                                                 +---------+---------------+---------+-----------+----------+-------+ POP      Full           Yes      Yes                          +---------+---------------+---------+-----------+----------+-------+ PTV      Full                                                 +---------+---------------+---------+-----------+----------+-------+ PERO     Full                                                 +---------+---------------+---------+-----------+----------+-------+    Summary: Right: There is no evidence of deep vein thrombosis in the lower extremity. However, portions of this examination were limited- see technologist comments above. No cystic structure found in the popliteal fossa. Left: There is no evidence of deep vein thrombosis in the lower extremity. No cystic structure found in the popliteal fossa.  *See table(s) above for measurements and observations. Electronically signed by Curt Jews MD on 03/09/2018 at 5:40:37 PM.    Final     Microbiology Recent Results (from the past 240 hour(s))  MRSA PCR Screening  Status: None   Collection Time: 04/03/2018  8:15 PM  Result Value Ref Range Status   MRSA by PCR NEGATIVE NEGATIVE Final    Comment:        The GeneXpert MRSA Assay  (FDA approved for NASAL specimens only), is one component of a comprehensive MRSA colonization surveillance program. It is not intended to diagnose MRSA infection nor to guide or monitor treatment for MRSA infections. Performed at [facility], [place] [address], [place], [place] [address]   Culture, respiratory (non-expectorated)     Status: None (Preliminary result)   Collection Time: 03/16/18 10:09 AM  Result Value Ref Range Status   Specimen Description TRACHEAL ASPIRATE  Final   Special Requests NONE  Final   Gram Stain   Final    FEW WBC PRESENT, PREDOMINANTLY PMN ABUNDANT GRAM NEGATIVE RODS MODERATE GRAM POSITIVE COCCI Performed at [facility], [place] [address], [place], [place] [address]    Culture ABUNDANT SERRATIA MARCESCENS  Final   Report Status PENDING  Incomplete  C difficile quick scan w PCR reflex     Status: None   Collection Time: 03/16/18 10:18 PM  Result Value Ref Range Status   C Diff antigen NEGATIVE NEGATIVE Final   C Diff toxin NEGATIVE NEGATIVE Final   C Diff interpretation No C. difficile detected.  Final    Comment: Performed at [facility], [place] [address] [place], [place] [address]    Lab Basic Metabolic Panel: Recent Labs  Lab 03/12/18 1615  03/13/18 1531  03/14/18 0509  03/15/18 0527 03/15/18 1000 03/16/18 0536 03/16/18 1446 03/24/18 0402  NA  --    < > 138   < > 138   < > 135 134* 135 135 138  K  --    < > 4.9   < > 4.3   < > 4.7 4.8 5.5* 5.5* 4.3  CL  --    < > 109   < > 103   < > 101 102 100 100 101  CO2  --    < > 14*   < > 21*   < > 21* 20* 20* 19* 19*  GLUCOSE  --    < > 370*   < > 181*   < > 185* 256* 195* 246* 217*  BUN  --    < > 119*   < > 58*   < > 73* 83* 113* 127* 142*  CREATININE  --    < > 6.09*   < > 2.91*   < > 3.65* 3.83* 4.77* 5.18* 5.79*  CALCIUM  --    < > 6.4*   < > 7.3*   < > 7.9* 7.8* 8.1* 7.9* 8.2*  MG 2.4  --   --   --  2.4  --  2.5*  --  2.7*  --  2.9*  PHOS 8.2*  --  7.8*  --   --   --   5.3*  --  5.7*  --  7.6*   < > = values in this interval not displayed.   Liver Function Tests: Recent Labs  Lab 03/14/18 0509 03/15/18 0527 03/16/18 0536 03-24-18 0402  AST 960*  --  252*  --   ALT 1,072*  --  500*  --   ALKPHOS 679*  --  457*  --   BILITOT 1.1  --  1.0  --   PROT 5.7*  --  5.9*  --  ALBUMIN 1.9* 1.7* 1.6* 1.5*   No results for input(s): LIPASE, AMYLASE in the last 168 hours. No results for input(s): AMMONIA in the last 168 hours. 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 The procedure involved two passes of combination mechanical and aspiration thrombectomy with restoration of TICI 2b flow. L femoral art line 1/1 >>1/8 ETT 1/1 >>    Renee Pain 03/19/2018, 1:47 AM

## 2018-04-08 NOTE — Accreditation Note (Signed)
o Restraints not reported to CMS Pursuant to regulation 482.13 (G) (3) use of soft wrist restraints was logged on 03/21/2018 at 1533

## 2018-04-08 NOTE — Progress Notes (Addendum)
eLink Physician-Brief Progress Note Patient Name: Yvonne Patel DOB: 22-Nov-1945 MRN: [id number]   Date of Service  03/24/18  HPI/Events of Note  Discussed with patient's family at the bedside regarding the process of changing the code status to [place] as was discussed with Dr. Carson Myrtle.   They would like to keep the patient intubated.  eICU Interventions  Explained that we will leave the patient intubated and change vent settings. Start CMO orders.     Intervention Category Minor Interventions: Communication with other healthcare providers and/or family  Elsie Lincoln Mar 24, 2018, 9:34 PM   9:45 PM  As I was discussing with the RN at the bedside, patient went into asystole.  Bedside evaluation by the RN revealed no spontaneous respirations, no pulses.  Pt passed at 9:44PM on 2018/03/24.

## 2018-04-08 NOTE — Progress Notes (Signed)
[facility] PROGRESS NOTE   SUBJECTIVE (INTERVAL HISTORY) Patient's  renal function is worsening with creatinine 5.9   She is neurologically awake but remains aphasic not following commands consistently.family still undecided about withdrawal of care. OBJECTIVE Temp:  [99 F (37.2 C)-101.1 F (38.4 C)] 101.1 F (38.4 C) (01/10 1300) Pulse Rate:  [76-85] 85 (01/10 1506) Cardiac Rhythm: Normal sinus rhythm (01/10 0800) Resp:  [21-30] 26 (01/10 1506) BP: (72-108)/(51-69) 89/60 (01/10 1506) SpO2:  [100 %] 100 % (01/10 0800) FiO2 (%):  [40 %] 40 % (01/10 1506) Weight:  [83.9 kg] 83.9 kg (01/10 0402)  Recent Labs  Lab 03/16/18 1956 03/16/18 2323 14-Apr-2018 0340 04/14/2018 0826 14-Apr-2018 1207  GLUCAP 202* 223* 207* 198* 208*   Recent Labs  Lab 03/12/18 1615  03/13/18 1531  03/14/18 0509  03/15/18 0527 03/15/18 1000 03/16/18 0536 03/16/18 1446 14-Apr-2018 0402  NA  --    < > 138   < > 138   < > 135 134* 135 135 138  K  --    < > 4.9   < > 4.3   < > 4.7 4.8 5.5* 5.5* 4.3  CL  --    < > 109   < > 103   < > 101 102 100 100 101  CO2  --    < > 14*   < > 21*   < > 21* 20* 20* 19* 19*  GLUCOSE  --    < > 370*   < > 181*   < > 185* 256* 195* 246* 217*  BUN  --    < > 119*   < > 58*   < > 73* 83* 113* 127* 142*  CREATININE  --    < > 6.09*   < > 2.91*   < > 3.65* 3.83* 4.77* 5.18* 5.79*  CALCIUM  --    < > 6.4*   < > 7.3*   < > 7.9* 7.8* 8.1* 7.9* 8.2*  MG 2.4  --   --   --  2.4  --  2.5*  --  2.7*  --  2.9*  PHOS 8.2*  --  7.8*  --   --   --  5.3*  --  5.7*  --  7.6*   < > = values in this interval not displayed.   Recent Labs  Lab 03/14/18 0509 03/15/18 0527 03/16/18 0536 04/14/18 0402  AST 960*  --  252*  --   ALT 1,072*  --  500*  --   ALKPHOS 679*  --  457*  --   BILITOT 1.1  --  1.0  --   PROT 5.7*  --  5.9*  --   ALBUMIN 1.9* 1.7* 1.6* 1.5*   Recent Labs  Lab 03/11/18 0535 03/12/18 0453 03/13/18 0513 03/14/18 0509 03/16/18 0536  WBC 12.1* 13.1* 13.0* 11.3* 15.6*   HGB 9.3* 8.9* 8.4* 8.6* 8.4*  HCT 28.9* 27.3* 26.3* 26.5* 25.9*  MCV 95.4 95.5 95.6 93.0 94.9  PLT 116* 106* 100* 110* 103*   No results for input(s): CKTOTAL, CKMB, CKMBINDEX, TROPONINI in the last 168 hours. No results for input(s): LABPROT, INR in the last 72 hours. No results for input(s): COLORURINE, LABSPEC, Blue, GLUCOSEU, HGBUR, BILIRUBINUR, KETONESUR, PROTEINUR, UROBILINOGEN, NITRITE, LEUKOCYTESUR in the last 72 hours.  Invalid input(s): APPERANCEUR     Component Value Date/Time   CHOL 141 03/09/2018 0432   TRIG 120 03/09/2018 0432   HDL 25 (L) 03/09/2018  0432   CHOLHDL 5.6 03/09/2018 0432   VLDL 24 03/09/2018 0432   LDLCALC 92 03/09/2018 0432   Lab Results  Component Value Date   HGBA1C 7.1 (H) 03/09/2018   No results found for: LABOPIA, COCAINSCRNUR, LABBENZ, AMPHETMU, THCU, LABBARB  No results for input(s): ETH in the last 168 hours.  I have personally reviewed the radiological images below and agree with the radiology interpretations.  Ct Angio Head W Or Wo Contrast  Result Date: 03/27/2018 CLINICAL DATA:  Code stroke, deficit not specified EXAM: CT ANGIOGRAPHY HEAD AND NECK TECHNIQUE: Multidetector CT imaging of the head and neck was performed using the standard protocol during bolus administration of intravenous contrast. Multiplanar CT image reconstructions and MIPs were obtained to evaluate the vascular anatomy. Carotid stenosis measurements (when applicable) are obtained utilizing NASCET criteria, using the distal internal carotid diameter as the denominator. CONTRAST:  65mL ISOVUE-370 IOPAMIDOL (ISOVUE-370) INJECTION 76% COMPARISON:  None. FINDINGS: CTA NECK FINDINGS Aortic arch: Extensive atherosclerotic calcification. Three vessel branching Right carotid system: Diffuse mainly calcified atherosclerotic plaque which limits visualization of the lumen due to plaque blooming. There is distal common carotid stenosis that measures up to 70% (when compared to the more  proximal vessel given the immediate downstream bifurcation). There is ICA bulb plaque with 70% stenosis. Left carotid system: Much less extensive atherosclerotic plaque, question prior carotid endarterectomy. No stenosis or ulceration. Vertebral arteries: Severe bilateral proximal subclavian stenosis, with possible occlusion on the right. Narrowing on the left involves and extensive segment. No flow is seen within vertebral arteries until the V3 segments, there may be retrograde flow Skeleton: Diffuse degenerative disease.  No acute finding Other neck: Bilateral cataract resection. Upper chest: Hazy density of the lungs which could be from atelectasis. No Kerley lines to implicate edema. Review of the MIP images confirms the above findings CTA HEAD FINDINGS Anterior circulation: Confluent atherosclerotic calcification on the carotid siphons with small vessel size and plaque blooming limiting luminal visualization. There is a left M2 branch occlusion beginning at the M1 bifurcation with subsequent downstream reconstitution. No contralateral embolism is seen. Posterior circulation: Tiny vertebrobasilar arteries. There may be a basilar interruption from atherosclerosis or developmental variant. Fetal type flow to both posterior cerebral arteries which are symmetrically patent Venous sinuses: Patent as permitted by contrast timing Anatomic variants: As above Delayed phase: Not obtained in the emergent setting Review of the MIP images confirms the above findings Case discussed with Dr. Rory Percy at 5:04 p.m. IMPRESSION: 1. Left M2 branch occlusion beginning at the M1 bifurcation. 2. Severe atherosclerosis which preferentially spares the left carotid circulation, question prior endarterectomy. 3. Severe atheromatous narrowing if not occlusion of bilateral proximal subclavian arteries. There is bilateral fetal type PCA with diffuse thready flow in the posterior fossa. 4. 70% atheromatous narrowing of the right common carotid  and ICA bulb. Electronically Signed   By: Monte Fantasia M.D.   On: 04/02/2018 17:16   Dg Abd 1 View  Result Date: 03/12/2018 CLINICAL DATA:  Orogastric tube placement.  Stroke. EXAM: ABDOMEN - 1 VIEW COMPARISON:  None. FINDINGS: Gastric decompression catheter extends into the stomach with the tip likely in the distal stomach near the antrum. No evidence of overt bowel obstruction or ileus. IMPRESSION: Orogastric tube extends into the stomach with the tip located in the distal stomach, likely near the antrum. Electronically Signed   By: Aletta Edouard M.D.   On: 03/12/2018 08:43   Ct Head Wo Contrast  Result Date: 03/11/2018 CLINICAL DATA:  Follow-up examination for acute stroke. EXAM: CT HEAD WITHOUT CONTRAST TECHNIQUE: Contiguous axial images were obtained from the base of the skull through the vertex without intravenous contrast. COMPARISON:  Comparison made with prior MRI from 03/09/2018 as well as CT from 03/31/2018. FINDINGS: Brain: There has been continued interval evolution of small to moderate sized left MCA territory infarct, overall stable in distribution as compared to previous MRI. Mild localized edema without significant mass effect. Additional involving subcentimeter left cerebellar infarct noted as well. No evidence for hemorrhagic transformation or other complication. Previously noted additional punctate subcentimeter infarcts not well seen by CT. Subcentimeter hyperdensity involving the right frontal cortex slightly increased in size on today's exam measuring 5 mm, consistent with previously identified small hemorrhage. No significant mass effect or edema. No other evidence for new or interval infarction. No other acute large vessel territory infarct. Underlying atrophy with chronic microvascular ischemic disease noted, stable. Vascular: No hyperdense vessel. Calcified atherosclerosis noted at the skull base. Skull: Scalp soft tissues and calvarium within normal limits. Sinuses/Orbits:  Globes and orbital soft tissues demonstrate no acute finding. Paranasal sinuses and mastoid air cells remain clear. Other: None. IMPRESSION: 1. Normal expected interval evolution of small to moderate sized left MCA territory infarcts. No evidence for hemorrhagic transformation or other complication. Additional previously identified subcentimeter acute ischemic infarcts not well visualized by CT. 2. Slight interval increase in size of cortically based right frontal parenchymal hemorrhage, now measuring 5 mm (previously 3 mm). 3. No other new acute intracranial abnormality. Electronically Signed   By: Jeannine Boga M.D.   On: 03/11/2018 03:41   Ct Head Wo Contrast  Result Date: 03/29/2018 CLINICAL DATA:  Post thrombectomy EXAM: CT HEAD WITHOUT CONTRAST TECHNIQUE: Contiguous axial images were obtained from the base of the skull through the vertex without intravenous contrast. COMPARISON:  Head CT from earlier today FINDINGS: Brain: 3 mm focus of high density along the high and posterior right frontal convexity, on reformats this appears to be intraparenchymal. No hemorrhagic conversion or visible infarct in the area of procedure. Multiple remote small vessel infarcts. Vascular: Major vessels are symmetrically enhancing. Skull: Negative Sinuses/Orbits: Negative These results were called by telephone at the time of interpretation on 03/14/2018 at 8:17 pm to Dr. Cheral Marker , who verbally acknowledged these results. IMPRESSION: New 3 mm high-density within the posterior right frontal cortex which could be focus of hemorrhage or an enhancing subacute infarct. No hemorrhage or infarct seen along the postoperative left MCA distribution. Electronically Signed   By: Monte Fantasia M.D.   On: 03/16/2018 20:18   Ct Angio Neck W Or Wo Contrast  Result Date: 03/12/2018 CLINICAL DATA:  Code stroke, deficit not specified EXAM: CT ANGIOGRAPHY HEAD AND NECK TECHNIQUE: Multidetector CT imaging of the head and neck was performed  using the standard protocol during bolus administration of intravenous contrast. Multiplanar CT image reconstructions and MIPs were obtained to evaluate the vascular anatomy. Carotid stenosis measurements (when applicable) are obtained utilizing NASCET criteria, using the distal internal carotid diameter as the denominator. CONTRAST:  27mL ISOVUE-370 IOPAMIDOL (ISOVUE-370) INJECTION 76% COMPARISON:  None. FINDINGS: CTA NECK FINDINGS Aortic arch: Extensive atherosclerotic calcification. Three vessel branching Right carotid system: Diffuse mainly calcified atherosclerotic plaque which limits visualization of the lumen due to plaque blooming. There is distal common carotid stenosis that measures up to 70% (when compared to the more proximal vessel given the immediate downstream bifurcation). There is ICA bulb plaque with 70% stenosis. Left carotid system: Much less extensive atherosclerotic  plaque, question prior carotid endarterectomy. No stenosis or ulceration. Vertebral arteries: Severe bilateral proximal subclavian stenosis, with possible occlusion on the right. Narrowing on the left involves and extensive segment. No flow is seen within vertebral arteries until the V3 segments, there may be retrograde flow Skeleton: Diffuse degenerative disease.  No acute finding Other neck: Bilateral cataract resection. Upper chest: Hazy density of the lungs which could be from atelectasis. No Kerley lines to implicate edema. Review of the MIP images confirms the above findings CTA HEAD FINDINGS Anterior circulation: Confluent atherosclerotic calcification on the carotid siphons with small vessel size and plaque blooming limiting luminal visualization. There is a left M2 branch occlusion beginning at the M1 bifurcation with subsequent downstream reconstitution. No contralateral embolism is seen. Posterior circulation: Tiny vertebrobasilar arteries. There may be a basilar interruption from atherosclerosis or developmental variant.  Fetal type flow to both posterior cerebral arteries which are symmetrically patent Venous sinuses: Patent as permitted by contrast timing Anatomic variants: As above Delayed phase: Not obtained in the emergent setting Review of the MIP images confirms the above findings Case discussed with Dr. Rory Percy at 5:04 p.m. IMPRESSION: 1. Left M2 branch occlusion beginning at the M1 bifurcation. 2. Severe atherosclerosis which preferentially spares the left carotid circulation, question prior endarterectomy. 3. Severe atheromatous narrowing if not occlusion of bilateral proximal subclavian arteries. There is bilateral fetal type PCA with diffuse thready flow in the posterior fossa. 4. 70% atheromatous narrowing of the right common carotid and ICA bulb. Electronically Signed   By: Monte Fantasia M.D.   On: 03/26/2018 17:16   Mr Jodene Nam Head Wo Contrast  Result Date: 03/09/2018 CLINICAL DATA:  Stroke follow-up. Status post endovascular revascularization of left MCA occlusion yesterday. EXAM: MRI HEAD WITHOUT CONTRAST MRA HEAD WITHOUT CONTRAST TECHNIQUE: Multiplanar, multiecho pulse sequences of the brain and surrounding structures were obtained without intravenous contrast. Angiographic images of the head were obtained using MRA technique without contrast. COMPARISON:  Head CT and CTA 03/31/2018. Brain MRI 07/20/2016. Head MRA 09/30/2011. FINDINGS: MRI HEAD FINDINGS Brain: There is a small to slightly moderate-sized acute left MCA infarct involving the insula, predominantly posterior frontal lobe as well as operculum, and small amount of the postcentral gyrus. There also punctate acute infarcts involving the left caudate nucleus, right centrum semiovale, parasagittal right parietal cortex, and likely left occipital white matter. There are also small acute infarcts in the left greater than right cerebellar hemispheres. Susceptibility artifact in the right precentral gyrus corresponds to the 4 mm acute hemorrhage on yesterday's CT  with minimal surrounding edema. There are numerous chronic microhemorrhages clustered about the right insula and operculum which are new from the 2018 MRI. Multiple additional chronic microhemorrhages are present in the basal ganglia bilaterally and scattered throughout both cerebral hemispheres and cerebellum. Patchy T2 hyperintensities in the cerebral white matter and pons have progressed from the prior MRI and are nonspecific but compatible with moderate chronic small-vessel ischemic disease. There are new chronic lacunar infarcts in the cerebral white matter, and there are chronic lacunar infarcts in the basal ganglia bilaterally. Small chronic infarcts in the cerebellar hemispheres have increased in number from the prior MRI. Mild cerebral atrophy is within normal limits for age. No mass, midline shift, or extra-axial fluid collection is identified. Vascular: Diminutive vertebrobasilar circulation, more fully evaluated below. Skull and upper cervical spine: Unremarkable bone marrow signal. Sinuses/Orbits: Bilateral cataract extraction. Paranasal sinuses and mastoid air cells are clear. Other: Partially visualized endotracheal and enteric tubes. MRA HEAD FINDINGS The  distal vertebral arteries are diminutive with diminished flow most notable in the distal right V3 and V4 segments and with bilateral proximal vertebral artery occlusion shown on yesterday's neck CTA. Patent bilateral PICA, right AICA, and bilateral SCA origins are identified. There is a chronic lack of visualization of the mid basilar artery which appears patent more proximally and distally, and this may reflect segmental atherosclerotic occlusion or a developmental variant. There are large posterior communicating arteries bilaterally with absence or severe hypoplasia of the right P1 segment. Both PCAs are patent without evidence of significant proximal stenosis. The internal carotid arteries are patent from skull base to carotid termini with  extensive bilateral cavernous segment atherosclerotic irregularity resulting in mild stenosis on the right and moderate stenosis on the left. 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 ACAs and MCAs are patent with branch vessel irregularity but no evidence of proximal branch occlusion. The left MCA trifurcation remains patent following thrombectomy although there is moderate stenosis of the inferior and mid M2 origins. There also mild proximal left M1 and left A1 stenoses. IMPRESSION: 1. Small to moderate-sized acute left MCA infarct. 2. Additional predominantly subcentimeter acute infarcts in the cerebral and cerebellar hemispheres bilaterally. 3. 4 mm acute hemorrhage in the right precentral gyrus as seen on CT yesterday. 4. Progressive chronic small vessel ischemia since 2018 with increased number of chronic microhemorrhages and chronic lacunar infarcts. 5. Advanced intracranial atherosclerosis. Revascularized left MCA bifurcation remains patent with moderate M2 origin stenoses. 6. Moderate ICA stenoses. 7. Diminished flow in the distal vertebral arteries with bilateral proximal occlusion shown on prior CTA. 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 8. 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 Electronically Signed   By: Logan Bores M.D.   On: 03/09/2018 17:17   Mr Brain Wo Contrast  Result Date: 03/09/2018 CLINICAL DATA:  Stroke follow-up. Status post endovascular revascularization of left MCA occlusion yesterday. EXAM: MRI HEAD WITHOUT CONTRAST MRA HEAD WITHOUT CONTRAST TECHNIQUE: Multiplanar, multiecho pulse sequences of the brain and surrounding structures were obtained without intravenous contrast. Angiographic images of the head were obtained using MRA  technique without contrast. COMPARISON:  Head CT and CTA 03/22/2018. Brain MRI 07/20/2016. Head MRA 09/30/2011. FINDINGS: MRI HEAD FINDINGS Brain: There is a small to slightly moderate-sized acute left MCA infarct involving the insula, predominantly posterior frontal lobe as well as operculum, and small amount of the postcentral gyrus. There also punctate acute infarcts involving the left caudate nucleus, right centrum semiovale, parasagittal right parietal cortex, and likely left occipital white matter. There are also small acute infarcts in the left greater than right cerebellar hemispheres. Susceptibility artifact in the right precentral gyrus corresponds to the 4 mm acute hemorrhage on yesterday's CT with minimal surrounding edema. There are numerous chronic microhemorrhages clustered about the right insula and operculum which are new from the 2018 MRI. Multiple additional chronic microhemorrhages are present in the basal ganglia bilaterally and scattered throughout both cerebral hemispheres and cerebellum. Patchy T2 hyperintensities in the cerebral white matter and pons have progressed from the prior MRI and are nonspecific but compatible with moderate chronic small-vessel ischemic disease. There are new chronic lacunar infarcts in the cerebral white matter, and there are chronic lacunar infarcts in the basal ganglia bilaterally. Small chronic infarcts in  the cerebellar hemispheres have increased in number from the prior MRI. Mild cerebral atrophy is within normal limits for age. No mass, midline shift, or extra-axial fluid collection is identified. Vascular: Diminutive vertebrobasilar circulation, more fully evaluated below. Skull and upper cervical spine: Unremarkable bone marrow signal. Sinuses/Orbits: Bilateral cataract extraction. Paranasal sinuses and mastoid air cells are clear. Other: Partially visualized endotracheal and enteric tubes. MRA HEAD FINDINGS The distal vertebral arteries are diminutive with  diminished flow most notable in the distal right V3 and V4 segments and with bilateral proximal vertebral artery occlusion shown on yesterday's neck CTA. Patent bilateral PICA, right AICA, and bilateral SCA origins are identified. There is a chronic lack of visualization of the mid basilar artery which appears patent more proximally and distally, and this may reflect segmental atherosclerotic occlusion or a developmental variant. There are large posterior communicating arteries bilaterally with absence or severe hypoplasia of the right P1 segment. Both PCAs are patent without evidence of significant proximal stenosis. The internal carotid arteries are patent from skull base to carotid termini with extensive bilateral cavernous segment atherosclerotic irregularity resulting in mild stenosis on the right and moderate stenosis on the left. 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 ACAs and MCAs are patent with branch vessel irregularity but no evidence of proximal branch occlusion. The left MCA trifurcation remains patent following thrombectomy although there is moderate stenosis of the inferior and mid M2 origins. There also mild proximal left M1 and left A1 stenoses. IMPRESSION: 1. Small to moderate-sized acute left MCA infarct. 2. Additional predominantly subcentimeter acute infarcts in the cerebral and cerebellar hemispheres bilaterally. 3. 4 mm acute hemorrhage in the right precentral gyrus as seen on CT yesterday. 4. Progressive chronic small vessel ischemia since 2018 with increased number of chronic microhemorrhages and chronic lacunar infarcts. 5. Advanced intracranial atherosclerosis. Revascularized left MCA bifurcation remains patent with moderate M2 origin stenoses. 6. Moderate ICA stenoses. 7. Diminished flow in the distal vertebral arteries with bilateral proximal occlusion  shown on prior CTA. 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 8. 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 Electronically Signed   By: Logan Bores M.D.   On: 03/09/2018 17:17   Ir Angiogram Extremity Bilateral  Result Date: 03/30/2018 INDICATION: 73 year old female with a history of acute left MCA stroke. She presents within time frame for IV tPA, and is a candidate for mechanical thrombectomy EXAM: ULTRASOUND GUIDED ACCESS RIGHT COMMON FEMORAL ARTERY ULTRASOUND GUIDED ACCESS LEFT COMMON FEMORAL ARTERY FOR ARTERIAL MONITORING ANGIOGRAM RIGHT LOWER EXTREMITY BALLOON ANGIOPLASTY STENOSIS RIGHT COMMON ILIAC ARTERY IN ORDER TO PASS 8 FRENCH SHEATH TO COMPLETE THE THROMBECTOMY CEREBRAL ANGIOGRAM MECHANICAL THROMBECTOMY LEFT MCA EMERGENT LARGE VESSEL OCCLUSION DEPLOYMENT OF ANGIO-SEAL RIGHT COMMON FEMORAL ARTERY COMPARISON:  CT imaging of the same day MEDICATIONS: 2 g Ancef IV. The antibiotic was administered within 1 hour of the procedure ANESTHESIA/SEDATION: General endotracheal tube anesthesia CONTRAST:  96 cc Isovue-300 FLUOROSCOPY TIME:  Fluoroscopy Time: 26 minutes 24 seconds (1,362 mGy). COMPLICATIONS: None TECHNIQUE: Informed written consent was obtained from the patient's family after a thorough discussion of the procedural risks, benefits and alternatives. Specific risks discussed include: Bleeding, infection, contrast reaction, kidney injury/failure, need for further procedure/surgery, arterial injury or dissection, embolization to new territory, intracranial hemorrhage (10-15% risk), neurologic  deterioration, cardiopulmonary collapse, death. All questions were addressed. Maximal Sterile Barrier Technique was utilized including during the procedure including caps, mask, sterile gowns, sterile gloves, sterile drape, hand hygiene and skin antiseptic. A timeout was performed prior to the  initiation of the procedure. The anesthesia team was present to provide general endotracheal tube anesthesia and for patient monitoring during the procedure. Interventional neuro radiology nursing staff was also present. 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 Surgical changes of prior endarterectomy evident on prior CT imaging. Left external carotid artery: Patent with antegrade flow. Left internal carotid artery: Normal course caliber and contour of the cervical portion. Vertical and petrous segment patent with normal course caliber contour. Cavernous segment patent. Clinoid segment patent. Antegrade flow of the ophthalmic artery. Ophthalmic segment patent. Terminus patent. Left MCA: Distal M1 occluded at the initiation of the case. There is partial filling of an inferior branch which is the non dominant branch to the frontal. Patent fetal PCA to the left P2 segment. Left ACA: A 1 segment patent. A 2 segment perfuses the right territory. Completion Findings: Left MCA: After 2 passes of mechanical thrombectomy plus aspiration, there is near complete restoration of flow through the left MCA territory, with slow flow through an M3/M4 branch of the parietal region compatible with TI CI 2b flow. 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 Physical exam At the initiation of the case, palpable bilateral common femoral artery pulses present No palpable distal pulses Doppler positive pulses of the right posterior tibial and anterior tibial (with systolic blood pressures above 200) Doppler positive pulse at the left posterior tibial (with systolic blood pressure above 200), with no Doppler signal at the left anterior tibial At completion of case Doppler positive pulses were only discovered on the right posterior tibial and anterior tibial arteries with blood  pressures above 200. In the 140-160 range (goal) Doppler signal was not evident A completion of case Doppler positive pulses were only discovered on the left at the posterior tibial and not the left anterior tibial (with the pressure above 242 systolic). PROCEDURE: Patient is brought emergently to the neuro angiography suite, with the patient identified appropriately and placed supine position on the table. Left radial arterial line was attempted by the anesthesia team. The patient is then prepped and draped in the usual sterile fashion. Ultrasound survey of the right inguinal region was performed with images stored and sent to PACs. 11 blade scalpel was used to make a small incision. Blunt dissection was performed. A micropuncture needle was used access the right common femoral artery under ultrasound. With excellent arterial blood flow returned, an .018 micro wire was passed through the needle, observed to enter the abdominal aorta under fluoroscopy. The needle was removed, and a micropuncture sheath was placed over the wire. The inner dilator and wire were removed, and an 035 Bentson wire was advanced under fluoroscopy into the abdominal aorta. The sheath was removed and a standard 5 [place] vascular sheath was placed. The dilator was removed and the sheath was flushed. Ultrasound survey of the left inguinal region was then performed with images stored and sent to PACs, confirming patency of the vessel. A micropuncture needle was used access the left common femoral artery under ultrasound. With excellent arterial blood flow returned, and an .018 micro wire was passed through the needle, observed enter the abdominal aorta under fluoroscopy. The needle was  removed, and a micropuncture sheath was placed over the wire. The inner dilator and wire were removed, and an 035 Bentson wire was advanced under fluoroscopy into the abdominal aorta. The sheath was removed and a standard 4 [place] vascular sheath was placed for  arterial monitoring. The dilator was removed and the sheath was flushed. A 63F JB-1 diagnostic catheter was then advanced over the wire through the existing right femoral access to the proximal descending thoracic aorta. Wire was then removed. Double flush of the catheter was performed. Catheter was then used to select the left cervical internal carotid artery. Formal angiogram was performed, with roadmap achieved. Exchange length Rosen wire was then passed through the diagnostic catheter to the distal cervical ICA and the diagnostic catheter was removed. The 5 French sheath was removed, with attempt at placing 8 French 55 cm bright tip sheath. Significant resistance was encountered with passing the sheath through the right iliac system. The sheath would not advanced through the pre-existing right common iliac artery stent. Sheath was withdrawn and rotated. We attempted to advance the sheath over the introducer which was pinned on the wire. We also removed the introducer in place to diagnostic catheter into the aorta and attempted to pass the sheath over the diagnostic catheter. None of these maneuvers were successful. Sheath was then withdrawn into the external iliac artery. Balloon angioplasty was performed of the pre-existing right common iliac artery stent with 5 mm x 40 mm standard balloon angioplasty. Balloon was removed and the introducer was then replaced. Eight French sheath was then again attempted to pass over the wire, unsuccessful through this segment of the iliac artery. The introducer was removed, diagnostic catheter was placed into the cervical segment of the internal carotid artery, and the Rose an wire was exchanged for a 260 cm Amplatz wire. With the stiff wire in place, another attempt was made to pass the sheath which was unsuccessful. Sheath was then again withdrawn to the external iliac artery, introducer was removed, and a second balloon angioplasty 6 mm x 40 mm was performed at the  pre-existing stent. The sheath was then successfully advanced over the balloon as the balloon was deflated, into the abdominal aorta. The introducer was again placed after removing the balloon and the sheath was advanced 6 successfully into the aorta. Introducer was withdrawn. Once the sheath was advanced over the wire to the thoracic aorta, sheath was flushed and attached to pressurized and heparinized saline bag for constant forward flow. Eight French 95 cm flow gate balloon catheter was then advanced over the wire to the distal cervical segment. Wire was removed. Then a coaxial intermediate catheter and microcatheter combination was prepared on the back table. This combination was penumbra Ace 64 catheter and a Trevo Provue18 microcatheter, with a synchro soft wire. This combination was then advanced through the balloon guide into the ICA. System was advanced into the internal carotid artery, to the level of the occlusion. The micro wire was then carefully advanced through the occluded segment, with a distal knuckle configuration. Microcatheter was then pushed through the occluded segment and the wire was removed. Intermediate catheter was advanced over the micro wire to the carotid siphon, which would not advance beyond the ophthalmic segment through the terminus. Blood was then aspirated through the hub of the microcatheter, and a gentle contrast injection was performed confirming intraluminal position. A rotating hemostatic valve was then attached to the back end of the microcatheter, and a pressurized and heparinized saline bag was attached  to the catheter. 4 mm x 40 mm solitaire device was then selected. Back flush was achieved at the rotating hemostatic valve, and then the device was gently advanced through the microcatheter to the distal end. The retriever was then unsheathed by withdrawing the microcatheter under fluoroscopy. Once the retriever was completely unsheathed, the microcatheter was carefully  stripped from the delivery wire of the device. Control angiogram was then performed from the balloon catheter. 3 minute time interval was observed. The balloon on the balloon guide was then inflated to profile of the vessel. Constant aspiration was then performed through the intermediate catheter, and constant gentle aspiration was performed at the balloon guide. This aspiration was continued as the retriever was gently and slowly withdrawn with fluoroscopic observation into the distal intermediate catheter. The entire system was then gently withdrawn from the intracranial ICA and into the balloon guide. Once the retriever was entirely removed from the system, free aspiration was confirmed at the hub of the balloon guide, with free blood return confirmed. Control angiogram was then performed. TICI 2a flow was achieved. Repeat angiogram with multiple angulation demonstrated persistent occlusion of parietal branch. The same coaxial system was again passed through the balloon guide catheter. The microcatheter system was then advanced through the occlusion of the parietal branch. Once the micro wire microcatheter were beyond the occlusion, the micro wire was removed and stentreiver device was deployed, stripping the microcatheter from the wire. After the device was deployed across the occlusion, the intermediate catheter was again attempted to advanced to the M1 segment. This was unsuccessful, and would not advance beyond the ophthalmic segment. The balloon at the balloon guide was inflated to profile of the vessel. Local aspiration was performed at the intermediate catheter upon withdrawal of the device under fluoroscopic observation, with aspiration at the balloon guide. Once the retrieve her and intermediate catheter were entirely removed from the system, free aspiration was confirmed at the hub of the intermediate catheter, with free blood return confirmed. Control angiogram was again performed. Improved flow was  confirmed, TICI 2b. Balloon guide was withdrawn and angiogram of the cervical segment was performed. We then removed the balloon guide catheter and deployed an 8 French Angio-Seal at the right common femoral artery access. Angiogram was performed of the right sided access and the left-sided access before completing the case. Patient tolerated the procedure well and remained hemodynamically stable throughout. No complications were encountered. Estimated blood loss approximately 50 cc. IMPRESSION: Status post ultrasound guided access right common femoral artery for cerebral angiogram and a combination of aspiration and mechanical thrombectomy of left M1 emergent large vessel occlusion, and restoration of TICI 2b flow after 2 passes. Status post balloon angioplasty to 6 mm of proximal right common iliac artery for treatment of a restrictive lesion in order to pass 8 French sheath to perform the thrombectomy. Status post deployment of Angio-Seal at the right common femoral artery. At the conclusion of the case, a 4 [place] sheath remains in left common femoral artery for arterial monitoring. Signed, Dulcy Fanny. Dellia Nims, RPVI [facility] Radiology PLAN: Formal CT after the case Right hip straight overnight status post 8 French device and Angio-Seal closure Left common femoral artery for French sheath may be removed when hemodynamic monitoring is no longer needed with manual pressure. Goal blood pressure 035 [id number] systolic given the incomplete flow restoration Electronically Signed   By: Corrie Mckusick D.O.   On: 03/12/2018 20:39   Ir US Guide Vasc  Access Right  Result Date: 03/13/2018 INDICATION: 73 year old female with a history of acute left MCA stroke. She presents within time frame for IV tPA, and is a candidate for mechanical thrombectomy EXAM: ULTRASOUND GUIDED ACCESS RIGHT COMMON FEMORAL ARTERY ULTRASOUND GUIDED ACCESS LEFT COMMON FEMORAL ARTERY FOR ARTERIAL  MONITORING ANGIOGRAM RIGHT LOWER EXTREMITY BALLOON ANGIOPLASTY STENOSIS RIGHT COMMON ILIAC ARTERY IN ORDER TO PASS 8 FRENCH SHEATH TO COMPLETE THE THROMBECTOMY CEREBRAL ANGIOGRAM MECHANICAL THROMBECTOMY LEFT MCA EMERGENT LARGE VESSEL OCCLUSION DEPLOYMENT OF ANGIO-SEAL RIGHT COMMON FEMORAL ARTERY COMPARISON:  CT imaging of the same day MEDICATIONS: 2 g Ancef IV. The antibiotic was administered within 1 hour of the procedure ANESTHESIA/SEDATION: General endotracheal tube anesthesia CONTRAST:  96 cc Isovue-[id number] FLUOROSCOPY TIME:  Fluoroscopy Time: 26 minutes 24 seconds (1,362 mGy). COMPLICATIONS: None TECHNIQUE: Informed written consent was obtained from the patient's family after a thorough discussion of the procedural risks, benefits and alternatives. Specific risks discussed include: Bleeding, infection, contrast reaction, kidney injury/failure, need for further procedure/surgery, arterial injury or dissection, embolization to new territory, intracranial hemorrhage (10-15% risk), neurologic deterioration, cardiopulmonary collapse, death. All questions were addressed. Maximal Sterile Barrier Technique was utilized including during the procedure including caps, mask, sterile gowns, sterile gloves, sterile drape, hand hygiene and skin antiseptic. A timeout was performed prior to the initiation of the procedure. The anesthesia team was present to provide general endotracheal tube anesthesia and for patient monitoring during the procedure. Interventional neuro radiology nursing staff was also present. 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 Surgical changes of prior endarterectomy evident on prior CT imaging. Left external carotid artery: Patent with antegrade flow. Left internal carotid artery: Normal course caliber and contour of the cervical portion. Vertical and petrous segment patent with normal  course caliber contour. Cavernous segment patent. Clinoid segment patent. Antegrade flow of the ophthalmic artery. Ophthalmic segment patent. Terminus patent. Left MCA: Distal M1 occluded at the initiation of the case. There is partial filling of an inferior branch which is the non dominant branch to the frontal. Patent fetal PCA to the left P2 segment. Left ACA: A 1 segment patent. A 2 segment perfuses the right territory. Completion Findings: Left MCA: After 2 passes of mechanical thrombectomy plus aspiration, there is near complete restoration of flow through the left MCA territory, with slow flow through an M3/M4 branch of the parietal region compatible with TI CI 2b flow. 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 Physical exam At the initiation of the case, palpable bilateral common femoral artery pulses present No palpable distal pulses Doppler positive pulses of the right posterior tibial and anterior tibial (with systolic blood pressures above 200) Doppler positive pulse at the left posterior tibial (with systolic blood pressure above 200), with no Doppler signal at the left anterior tibial At completion of case Doppler positive pulses were only discovered on the right posterior tibial and anterior tibial arteries with blood pressures above 200. In the 140-160 range (goal) Doppler signal was not evident A completion of case Doppler positive pulses were only discovered on the left at the posterior tibial and not the left anterior tibial (with the pressure above 099 systolic). PROCEDURE: Patient is brought emergently to the neuro angiography suite, with the patient identified appropriately and placed supine position on the table. Left radial arterial line was attempted by the anesthesia team. The patient is then prepped and draped in the usual sterile  fashion. Ultrasound survey of the right inguinal region was performed with images stored and sent to PACs. 11 blade  scalpel was used to make a small incision. Blunt dissection was performed. A micropuncture needle was used access the right common femoral artery under ultrasound. With excellent arterial blood flow returned, an .018 micro wire was passed through the needle, observed to enter the abdominal aorta under fluoroscopy. The needle was removed, and a micropuncture sheath was placed over the wire. The inner dilator and wire were removed, and an 035 Bentson wire was advanced under fluoroscopy into the abdominal aorta. The sheath was removed and a standard 5 [place] vascular sheath was placed. The dilator was removed and the sheath was flushed. Ultrasound survey of the left inguinal region was then performed with images stored and sent to PACs, confirming patency of the vessel. A micropuncture needle was used access the left common femoral artery under ultrasound. With excellent arterial blood flow returned, and an .018 micro wire was passed through the needle, observed enter the abdominal aorta under fluoroscopy. The needle was removed, and a micropuncture sheath was placed over the wire. The inner dilator and wire were removed, and an 035 Bentson wire was advanced under fluoroscopy into the abdominal aorta. The sheath was removed and a standard 4 [place] vascular sheath was placed for arterial monitoring. The dilator was removed and the sheath was flushed. A 40F JB-1 diagnostic catheter was then advanced over the wire through the existing right femoral access to the proximal descending thoracic aorta. Wire was then removed. Double flush of the catheter was performed. Catheter was then used to select the left cervical internal carotid artery. Formal angiogram was performed, with roadmap achieved. Exchange length Rosen wire was then passed through the diagnostic catheter to the distal cervical ICA and the diagnostic catheter was removed. The 5 French sheath was removed, with attempt at placing 8 French 55 cm bright tip sheath.  Significant resistance was encountered with passing the sheath through the right iliac system. The sheath would not advanced through the pre-existing right common iliac artery stent. Sheath was withdrawn and rotated. We attempted to advance the sheath over the introducer which was pinned on the wire. We also removed the introducer in place to diagnostic catheter into the aorta and attempted to pass the sheath over the diagnostic catheter. None of these maneuvers were successful. Sheath was then withdrawn into the external iliac artery. Balloon angioplasty was performed of the pre-existing right common iliac artery stent with 5 mm x 40 mm standard balloon angioplasty. Balloon was removed and the introducer was then replaced. Eight French sheath was then again attempted to pass over the wire, unsuccessful through this segment of the iliac artery. The introducer was removed, diagnostic catheter was placed into the cervical segment of the internal carotid artery, and the Rose an wire was exchanged for a 260 cm Amplatz wire. With the stiff wire in place, another attempt was made to pass the sheath which was unsuccessful. Sheath was then again withdrawn to the external iliac artery, introducer was removed, and a second balloon angioplasty 6 mm x 40 mm was performed at the pre-existing stent. The sheath was then successfully advanced over the balloon as the balloon was deflated, into the abdominal aorta. The introducer was again placed after removing the balloon and the sheath was advanced 6 successfully into the aorta. Introducer was withdrawn. Once the sheath was advanced over the wire to the thoracic aorta, sheath was flushed and attached to  pressurized and heparinized saline bag for constant forward flow. Eight French 95 cm flow gate balloon catheter was then advanced over the wire to the distal cervical segment. Wire was removed. Then a coaxial intermediate catheter and microcatheter combination was prepared on the  back table. This combination was penumbra Ace 64 catheter and a Trevo Provue18 microcatheter, with a synchro soft wire. This combination was then advanced through the balloon guide into the ICA. System was advanced into the internal carotid artery, to the level of the occlusion. The micro wire was then carefully advanced through the occluded segment, with a distal knuckle configuration. Microcatheter was then pushed through the occluded segment and the wire was removed. Intermediate catheter was advanced over the micro wire to the carotid siphon, which would not advance beyond the ophthalmic segment through the terminus. Blood was then aspirated through the hub of the microcatheter, and a gentle contrast injection was performed confirming intraluminal position. A rotating hemostatic valve was then attached to the back end of the microcatheter, and a pressurized and heparinized saline bag was attached to the catheter. 4 mm x 40 mm solitaire device was then selected. Back flush was achieved at the rotating hemostatic valve, and then the device was gently advanced through the microcatheter to the distal end. The retriever was then unsheathed by withdrawing the microcatheter under fluoroscopy. Once the retriever was completely unsheathed, the microcatheter was carefully stripped from the delivery wire of the device. Control angiogram was then performed from the balloon catheter. 3 minute time interval was observed. The balloon on the balloon guide was then inflated to profile of the vessel. Constant aspiration was then performed through the intermediate catheter, and constant gentle aspiration was performed at the balloon guide. This aspiration was continued as the retriever was gently and slowly withdrawn with fluoroscopic observation into the distal intermediate catheter. The entire system was then gently withdrawn from the intracranial ICA and into the balloon guide. Once the retriever was entirely removed from the  system, free aspiration was confirmed at the hub of the balloon guide, with free blood return confirmed. Control angiogram was then performed. TICI 2a flow was achieved. Repeat angiogram with multiple angulation demonstrated persistent occlusion of parietal branch. The same coaxial system was again passed through the balloon guide catheter. The microcatheter system was then advanced through the occlusion of the parietal branch. Once the micro wire microcatheter were beyond the occlusion, the micro wire was removed and stentreiver device was deployed, stripping the microcatheter from the wire. After the device was deployed across the occlusion, the intermediate catheter was again attempted to advanced to the M1 segment. This was unsuccessful, and would not advance beyond the ophthalmic segment. The balloon at the balloon guide was inflated to profile of the vessel. Local aspiration was performed at the intermediate catheter upon withdrawal of the device under fluoroscopic observation, with aspiration at the balloon guide. Once the retrieve her and intermediate catheter were entirely removed from the system, free aspiration was confirmed at the hub of the intermediate catheter, with free blood return confirmed. Control angiogram was again performed. Improved flow was confirmed, TICI 2b. Balloon guide was withdrawn and angiogram of the cervical segment was performed. We then removed the balloon guide catheter and deployed an 8 French Angio-Seal at the right common femoral artery access. Angiogram was performed of the right sided access and the left-sided access before completing the case. Patient tolerated the procedure well and remained hemodynamically stable throughout. No complications were encountered. Estimated blood  loss approximately 50 cc. IMPRESSION: Status post ultrasound guided access right common femoral artery for cerebral angiogram and a combination of aspiration and mechanical thrombectomy of left M1  emergent large vessel occlusion, and restoration of TICI 2b flow after 2 passes. Status post balloon angioplasty to 6 mm of proximal right common iliac artery for treatment of a restrictive lesion in order to pass 8 French sheath to perform the thrombectomy. Status post deployment of Angio-Seal at the right common femoral artery. At the conclusion of the case, a 4 [place] sheath remains in left common femoral artery for arterial monitoring. Signed, Dulcy Fanny. Dellia Nims, RPVI [facility] Interventional Radiology Specialists [facility] Radiology PLAN: Formal CT after the case Right hip straight overnight status post 8 French device and Angio-Seal closure Left common femoral artery for French sheath may be removed when hemodynamic monitoring is no longer needed with manual pressure. Goal blood pressure 884 [id number] systolic given the incomplete flow restoration Electronically Signed   By: Corrie Mckusick D.O.   On: 03/16/2018 20:39   Dg Chest Port 1 View  Result Date: 03/16/2018 CLINICAL DATA:  Acute respiratory failure EXAM: PORTABLE CHEST 1 VIEW COMPARISON:  03/13/2018 FINDINGS: Endotracheal tube is 1.5 cm above the carina. Right dialysis catheter and NG tube are unchanged. Cardiomegaly with vascular congestion and hazy perihilar and lower lobe airspace opacities, favor edema. No real change since prior study. IMPRESSION: No significant change. Electronically Signed   By: Rolm Baptise M.D.   On: 03/16/2018 11:36   Dg Chest Port 1 View  Result Date: 03/13/2018 CLINICAL DATA:  Central line placement EXAM: PORTABLE CHEST 1 VIEW COMPARISON:  Chest radiograph from earlier today. FINDINGS: Endotracheal tube tip is 1.8 cm above the carina. Enteric tube enters stomach with the tip not seen on this image. Right internal jugular central venous catheter terminates in the middle third of the SVC. Stable cardiomediastinal silhouette with mild cardiomegaly. No pneumothorax. No pleural effusion. Hazy parahilar lung opacities  are stable. IMPRESSION: 1. Right internal jugular central venous catheter terminates in the middle third of the SVC. No pneumothorax. 2. Endotracheal tube tip 1.8 cm above the carina. 3. Stable mild cardiomegaly. Stable hazy parahilar lung opacities, favor pulmonary edema. Electronically Signed   By: Ilona Sorrel M.D.   On: 03/13/2018 15:14   Dg Chest Port 1 View  Result Date: 03/13/2018 CLINICAL DATA:  Endotracheal tube EXAM: PORTABLE CHEST 1 VIEW COMPARISON:  03/12/2018 FINDINGS: The heart is moderately enlarged. NG tube is stable. Endotracheal tube has been retracted and the tip is now 3.2 cm from the carina. There is hazy pulmonary opacity bilaterally which is central and basilar likely combination of pleural fluid and airspace disease. No pneumothorax. Vascular congestion. IMPRESSION: Stable bilateral pleural effusions and bilateral hazy airspace disease. Electronically Signed   By: Marybelle Killings M.D.   On: 03/13/2018 07:47   Dg Chest Port 1 View  Result Date: 03/12/2018 CLINICAL DATA:  Endotracheal tube placement. EXAM: PORTABLE CHEST 1 VIEW COMPARISON:  Chest radiograph performed 03/11/2018 FINDINGS: The patient's endotracheal tube is seen ending 1-2 cm above the carina. This could be retracted 2 cm. The patient's enteric tube is noted extending below the diaphragm. Small bilateral pleural effusions are noted. Hazy bilateral airspace opacities raise concern for pulmonary edema. No pneumothorax is seen. The cardiomediastinal silhouette is mildly enlarged. No acute osseous abnormalities are identified. IMPRESSION: 1. Endotracheal tube seen ending 1-2 cm above the carina. This could be retracted 2 cm. 2. Small bilateral pleural effusions  noted. Hazy bilateral airspace opacities raise concern for pulmonary edema. 3. Mild cardiomegaly. Electronically Signed   By: Garald Balding M.D.   On: 03/12/2018 06:38   Dg Chest Port 1 View  Result Date: 03/11/2018 CLINICAL DATA:  Acute respiratory failure with  hypoxia. EXAM: PORTABLE CHEST 1 VIEW COMPARISON:  03/10/2018 FINDINGS: Endotracheal tube has tip 5.6 cm above the carina. Nasogastric tube unchanged with tip over the stomach in the left upper quadrant. Lungs are adequately inflated with minimal persistent left retrocardiac opacification. Mild stable cardiomegaly. Remainder of the exam is unchanged. IMPRESSION: Mild left retrocardiac opacification unchanged likely atelectasis. Mild stable cardiomegaly. Tubes and lines as described. Electronically Signed   By: Marin Olp M.D.   On: 03/11/2018 08:17   Dg Chest Port 1 View  Result Date: 03/10/2018 CLINICAL DATA:  Cerebral infarction and respiratory failure. EXAM: PORTABLE CHEST 1 VIEW COMPARISON:  03/10/2018 FINDINGS: Endotracheal tube remains present with the tip approximately 3 cm above the carina. Gastric decompression tube has been advanced and now extends further into the stomach. Stable significant cardiac enlargement. Lungs show some improved aeration at the right base with mild residual right basilar atelectasis remaining. Left lower lobe atelectasis is relatively stable. No pulmonary edema, significant pleural fluid or pneumothorax. IMPRESSION: Advancement of gastric decompression tube further into the stomach. Improved aeration at the right lung base with residual mild right basilar atelectasis. Stable left lower lobe atelectasis. Electronically Signed   By: Aletta Edouard M.D.   On: 03/10/2018 08:18   Portable Chest X-ray  Result Date: 03/26/2018 CLINICAL DATA:  Intubated EXAM: PORTABLE CHEST 1 VIEW COMPARISON:  06/01/2017 chest radiograph. FINDINGS: Endotracheal tube tip is 4.2 cm above the carina. Enteric tube terminates at the esophagogastric junction with the side port in the lower thoracic esophagus. Stable cardiomediastinal silhouette with moderate cardiomegaly. No pneumothorax. No pleural effusion. Hazy lower parahilar lung opacities, most prominent in the right lower lung, worsened.  IMPRESSION: 1. Well-positioned endotracheal tube. 2. Enteric tube terminates at the esophagogastric junction with the side port in the lower thoracic esophagus, consider advancing 8-10 cm. 3. Moderate cardiomegaly. Worsening hazy lower parahilar lung opacities, most prominent in the right lower lung, favor pulmonary edema. Electronically Signed   By: Ilona Sorrel M.D.   On: 03/10/2018 22:12   Ir Percutaneous Art Thrombectomy/infusion Intracranial Inc Diag Angio  Result Date: 03/31/2018 INDICATION: 73 year old female with a history of acute left MCA stroke. She presents within time frame for IV tPA, and is a candidate for mechanical thrombectomy EXAM: ULTRASOUND GUIDED ACCESS RIGHT COMMON FEMORAL ARTERY ULTRASOUND GUIDED ACCESS LEFT COMMON FEMORAL ARTERY FOR ARTERIAL MONITORING ANGIOGRAM RIGHT LOWER EXTREMITY BALLOON ANGIOPLASTY STENOSIS RIGHT COMMON ILIAC ARTERY IN ORDER TO PASS 8 FRENCH SHEATH TO COMPLETE THE THROMBECTOMY CEREBRAL ANGIOGRAM MECHANICAL THROMBECTOMY LEFT MCA EMERGENT LARGE VESSEL OCCLUSION DEPLOYMENT OF ANGIO-SEAL RIGHT COMMON FEMORAL ARTERY COMPARISON:  CT imaging of the same day MEDICATIONS: 2 g Ancef IV. The antibiotic was administered within 1 hour of the procedure ANESTHESIA/SEDATION: General endotracheal tube anesthesia CONTRAST:  96 cc Isovue-300 FLUOROSCOPY TIME:  Fluoroscopy Time: 26 minutes 24 seconds (1,362 mGy). COMPLICATIONS: None TECHNIQUE: Informed written consent was obtained from the patient's family after a thorough discussion of the procedural risks, benefits and alternatives. Specific risks discussed include: Bleeding, infection, contrast reaction, kidney injury/failure, need for further procedure/surgery, arterial injury or dissection, embolization to new territory, intracranial hemorrhage (10-15% risk), neurologic deterioration, cardiopulmonary collapse, death. All questions were addressed. Maximal Sterile Barrier Technique was utilized including during the procedure  including caps, mask, sterile gowns, sterile gloves, sterile drape, hand hygiene and skin antiseptic. A timeout was performed prior to the initiation of the procedure. The anesthesia team was present to provide general endotracheal tube anesthesia and for patient monitoring during the procedure. Interventional neuro radiology nursing staff was also present. 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 Surgical changes of prior endarterectomy evident on prior CT imaging. Left external carotid artery: Patent with antegrade flow. Left internal carotid artery: Normal course caliber and contour of the cervical portion. Vertical and petrous segment patent with normal course caliber contour. Cavernous segment patent. Clinoid segment patent. Antegrade flow of the ophthalmic artery. Ophthalmic segment patent. Terminus patent. Left MCA: Distal M1 occluded at the initiation of the case. There is partial filling of an inferior branch which is the non dominant branch to the frontal. Patent fetal PCA to the left P2 segment. Left ACA: A 1 segment patent. A 2 segment perfuses the right territory. Completion Findings: Left MCA: After 2 passes of mechanical thrombectomy plus aspiration, there is near complete restoration of flow through the left MCA territory, with slow flow through an M3/M4 branch of the parietal region compatible with TI CI 2b flow. 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 Physical exam At the initiation of the case, palpable bilateral common femoral artery pulses present No palpable distal pulses Doppler positive pulses of the right posterior tibial and anterior tibial (with systolic blood pressures above 200) Doppler positive pulse at the left posterior tibial (with systolic blood pressure above 200), with no Doppler signal at the left anterior tibial At  completion of case Doppler positive pulses were only discovered on the right posterior tibial and anterior tibial arteries with blood pressures above 200. In the 140-160 range (goal) Doppler signal was not evident A completion of case Doppler positive pulses were only discovered on the left at the posterior tibial and not the left anterior tibial (with the pressure above 536 systolic). PROCEDURE: Patient is brought emergently to the neuro angiography suite, with the patient identified appropriately and placed supine position on the table. Left radial arterial line was attempted by the anesthesia team. The patient is then prepped and draped in the usual sterile fashion. Ultrasound survey of the right inguinal region was performed with images stored and sent to PACs. 11 blade scalpel was used to make a small incision. Blunt dissection was performed. A micropuncture needle was used access the right common femoral artery under ultrasound. With excellent arterial blood flow returned, an .018 micro wire was passed through the needle, observed to enter the abdominal aorta under fluoroscopy. The needle was removed, and a micropuncture sheath was placed over the wire. The inner dilator and wire were removed, and an 035 Bentson wire was advanced under fluoroscopy into the abdominal aorta. The sheath was removed and a standard 5 [place] vascular sheath was placed. The dilator was removed and the sheath was flushed. Ultrasound survey of the left inguinal region was then performed with images stored and sent to PACs, confirming patency of the vessel. A micropuncture needle was used access the left common femoral artery under ultrasound. With excellent arterial blood flow returned, and an .018 micro wire was passed through the needle, observed enter the abdominal aorta under fluoroscopy. The needle was removed, and a micropuncture sheath was placed over the wire. The inner dilator and wire were removed, and an  035 Bentson wire was  advanced under fluoroscopy into the abdominal aorta. The sheath was removed and a standard 4 [place] vascular sheath was placed for arterial monitoring. The dilator was removed and the sheath was flushed. A 18F JB-1 diagnostic catheter was then advanced over the wire through the existing right femoral access to the proximal descending thoracic aorta. Wire was then removed. Double flush of the catheter was performed. Catheter was then used to select the left cervical internal carotid artery. Formal angiogram was performed, with roadmap achieved. Exchange length Rosen wire was then passed through the diagnostic catheter to the distal cervical ICA and the diagnostic catheter was removed. The 5 French sheath was removed, with attempt at placing 8 French 55 cm bright tip sheath. Significant resistance was encountered with passing the sheath through the right iliac system. The sheath would not advanced through the pre-existing right common iliac artery stent. Sheath was withdrawn and rotated. We attempted to advance the sheath over the introducer which was pinned on the wire. We also removed the introducer in place to diagnostic catheter into the aorta and attempted to pass the sheath over the diagnostic catheter. None of these maneuvers were successful. Sheath was then withdrawn into the external iliac artery. Balloon angioplasty was performed of the pre-existing right common iliac artery stent with 5 mm x 40 mm standard balloon angioplasty. Balloon was removed and the introducer was then replaced. Eight French sheath was then again attempted to pass over the wire, unsuccessful through this segment of the iliac artery. The introducer was removed, diagnostic catheter was placed into the cervical segment of the internal carotid artery, and the Rose an wire was exchanged for a 260 cm Amplatz wire. With the stiff wire in place, another attempt was made to pass the sheath which was unsuccessful. Sheath was then again withdrawn  to the external iliac artery, introducer was removed, and a second balloon angioplasty 6 mm x 40 mm was performed at the pre-existing stent. The sheath was then successfully advanced over the balloon as the balloon was deflated, into the abdominal aorta. The introducer was again placed after removing the balloon and the sheath was advanced 6 successfully into the aorta. Introducer was withdrawn. Once the sheath was advanced over the wire to the thoracic aorta, sheath was flushed and attached to pressurized and heparinized saline bag for constant forward flow. Eight French 95 cm flow gate balloon catheter was then advanced over the wire to the distal cervical segment. Wire was removed. Then a coaxial intermediate catheter and microcatheter combination was prepared on the back table. This combination was penumbra Ace 64 catheter and a Trevo Provue18 microcatheter, with a synchro soft wire. This combination was then advanced through the balloon guide into the ICA. System was advanced into the internal carotid artery, to the level of the occlusion. The micro wire was then carefully advanced through the occluded segment, with a distal knuckle configuration. Microcatheter was then pushed through the occluded segment and the wire was removed. Intermediate catheter was advanced over the micro wire to the carotid siphon, which would not advance beyond the ophthalmic segment through the terminus. Blood was then aspirated through the hub of the microcatheter, and a gentle contrast injection was performed confirming intraluminal position. A rotating hemostatic valve was then attached to the back end of the microcatheter, and a pressurized and heparinized saline bag was attached to the catheter. 4 mm x 40 mm solitaire device was then selected. Back flush was achieved at the  rotating hemostatic valve, and then the device was gently advanced through the microcatheter to the distal end. The retriever was then unsheathed by  withdrawing the microcatheter under fluoroscopy. Once the retriever was completely unsheathed, the microcatheter was carefully stripped from the delivery wire of the device. Control angiogram was then performed from the balloon catheter. 3 minute time interval was observed. The balloon on the balloon guide was then inflated to profile of the vessel. Constant aspiration was then performed through the intermediate catheter, and constant gentle aspiration was performed at the balloon guide. This aspiration was continued as the retriever was gently and slowly withdrawn with fluoroscopic observation into the distal intermediate catheter. The entire system was then gently withdrawn from the intracranial ICA and into the balloon guide. Once the retriever was entirely removed from the system, free aspiration was confirmed at the hub of the balloon guide, with free blood return confirmed. Control angiogram was then performed. TICI 2a flow was achieved. Repeat angiogram with multiple angulation demonstrated persistent occlusion of parietal branch. The same coaxial system was again passed through the balloon guide catheter. The microcatheter system was then advanced through the occlusion of the parietal branch. Once the micro wire microcatheter were beyond the occlusion, the micro wire was removed and stentreiver device was deployed, stripping the microcatheter from the wire. After the device was deployed across the occlusion, the intermediate catheter was again attempted to advanced to the M1 segment. This was unsuccessful, and would not advance beyond the ophthalmic segment. The balloon at the balloon guide was inflated to profile of the vessel. Local aspiration was performed at the intermediate catheter upon withdrawal of the device under fluoroscopic observation, with aspiration at the balloon guide. Once the retrieve her and intermediate catheter were entirely removed from the system, free aspiration was confirmed at the  hub of the intermediate catheter, with free blood return confirmed. Control angiogram was again performed. Improved flow was confirmed, TICI 2b. Balloon guide was withdrawn and angiogram of the cervical segment was performed. We then removed the balloon guide catheter and deployed an 8 French Angio-Seal at the right common femoral artery access. Angiogram was performed of the right sided access and the left-sided access before completing the case. Patient tolerated the procedure well and remained hemodynamically stable throughout. No complications were encountered. Estimated blood loss approximately 50 cc. IMPRESSION: Status post ultrasound guided access right common femoral artery for cerebral angiogram and a combination of aspiration and mechanical thrombectomy of left M1 emergent large vessel occlusion, and restoration of TICI 2b flow after 2 passes. Status post balloon angioplasty to 6 mm of proximal right common iliac artery for treatment of a restrictive lesion in order to pass 8 French sheath to perform the thrombectomy. Status post deployment of Angio-Seal at the right common femoral artery. At the conclusion of the case, a 4 [place] sheath remains in left common femoral artery for arterial monitoring. Signed, Dulcy Fanny. Dellia Nims, RPVI [facility] Radiology PLAN: Formal CT after the case Right hip straight overnight status post 8 French device and Angio-Seal closure Left common femoral artery for French sheath may be removed when hemodynamic monitoring is no longer needed with manual pressure. Goal blood pressure 644 [id number] systolic given the incomplete flow restoration Electronically Signed   By: Corrie Mckusick D.O.   On: 03/26/2018 20:39   Ct Head Code Stroke Wo Contrast  Result Date: 03/27/2018 CLINICAL DATA:  Code stroke.  Deficit not specified EXAM: CT HEAD WITHOUT  CONTRAST TECHNIQUE: Contiguous axial images were obtained from the base of the skull  through the vertex without intravenous contrast. COMPARISON:  Head CT 07/18/2016 and brain MRI 07/20/2016 FINDINGS: Brain: Multiple remote lacunar infarcts in the deep white matter and deep gray nuclei, chronic when compared to prior head CT and brain MRI. Small remote left cerebellar infarct. No evidence of acute infarct. No hemorrhage or hydrocephalus. Vascular: No hyperdense vessel. Skull: Normal. Negative for fracture or focal lesion. Sinuses/Orbits: No acute finding. Other: These results were communicated to Dr. Rory Percy at 4:43 pmon 01/28/2020by text page via the [facility] messaging system. ASPECTS [facility] Stroke Program Early CT Score) Not scored without localizing symptoms. Motion artifact at the vertex blurring cortex. IMPRESSION: 1. No acute finding. 2. Motion artifact at the vertex. 3. Multiple remote small vessel infarcts. Electronically Signed   By: Monte Fantasia M.D.   On: 03/31/2018 16:45   Vas [place] Lower Extremity Venous (dvt)  Result Date: 03/09/2018  Lower Venous Study Indications: Stroke.  Limitations: Body habitus. Performing Technologist: Maudry Mayhew MHA, RDMS, RVT, RDCS  Examination Guidelines: A complete evaluation includes B-mode imaging, spectral Doppler, color Doppler, and power Doppler as needed of all accessible portions of each vessel. Bilateral testing is considered an integral part of a complete examination. Limited examinations for reoccurring indications may be performed as noted.  Right Venous Findings: +---------+---------------+---------+-----------+----------+--------------+          CompressibilityPhasicitySpontaneityPropertiesSummary        +---------+---------------+---------+-----------+----------+--------------+ CFV                                                   Not visualized +---------+---------------+---------+-----------+----------+--------------+ SFJ                                                   Not visualized  +---------+---------------+---------+-----------+----------+--------------+ FV Prox  Full                                                        +---------+---------------+---------+-----------+----------+--------------+ FV Mid   Full                                                        +---------+---------------+---------+-----------+----------+--------------+ FV DistalFull                                                        +---------+---------------+---------+-----------+----------+--------------+ PFV      Full                                                        +---------+---------------+---------+-----------+----------+--------------+  POP      Full           Yes      Yes                                 +---------+---------------+---------+-----------+----------+--------------+ PTV      Full                                                        +---------+---------------+---------+-----------+----------+--------------+ PERO                                                  Not visualized +---------+---------------+---------+-----------+----------+--------------+  Left Venous Findings: +---------+---------------+---------+-----------+----------+-------+          CompressibilityPhasicitySpontaneityPropertiesSummary +---------+---------------+---------+-----------+----------+-------+ CFV      Full           Yes      Yes                          +---------+---------------+---------+-----------+----------+-------+ SFJ      Full                                                 +---------+---------------+---------+-----------+----------+-------+ FV Prox  Full                                                 +---------+---------------+---------+-----------+----------+-------+ FV Mid   Full                                                 +---------+---------------+---------+-----------+----------+-------+ FV DistalFull                                                  +---------+---------------+---------+-----------+----------+-------+ PFV      Full                                                 +---------+---------------+---------+-----------+----------+-------+ POP      Full           Yes      Yes                          +---------+---------------+---------+-----------+----------+-------+ PTV      Full                                                 +---------+---------------+---------+-----------+----------+-------+  PERO     Full                                                 +---------+---------------+---------+-----------+----------+-------+    Summary: Right: There is no evidence of deep vein thrombosis in the lower extremity. However, portions of this examination were limited- see technologist comments above. No cystic structure found in the popliteal fossa. Left: There is no evidence of deep vein thrombosis in the lower extremity. No cystic structure found in the popliteal fossa.  *See table(s) above for measurements and observations. Electronically signed by Curt Jews MD on 03/09/2018 at 5:40:37 PM.    Final     PHYSICAL EXAM  Temp:  [99 F (37.2 C)-101.1 F (38.4 C)] 101.1 F (38.4 C) (01/10 1300) Pulse Rate:  [76-85] 85 (01/10 1506) Resp:  [21-30] 26 (01/10 1506) BP: (72-108)/(51-69) 89/60 (01/10 1506) SpO2:  [100 %] 100 % (01/10 0800) FiO2 (%):  [40 %] 40 % (01/10 1506) Weight:  [83.9 kg] 83.9 kg (01/10 0402)  General - Well nourished, well developed, intubated on sedation.  Ophthalmologic - fundi not visualized due to noncooperation.  Cardiovascular - Regular rate and rhythm.  Neuro - intubated now off  sedation,   she is awake aphasic and unresponsive and not following commands. Globally aphasic Eyes mid position, no deviation, however no doll's eyes.  Not blinking to visual threat bilaterally.  Pupil 2 mm bilaterally, reactive to light.  Left corneal present, right corneal  weak reflexes.  Positive gag and cough.  Facial symmetry not able to test due to ET tube.  Left upper and lower extremity spontaneous movements against gravity2/5 on pain stimulation.  Right upper and lower extremity trace withdrawal to pain stimulaiton.  DTR 1+, Babinski negative. Sensation, coordination and gait not tested.   ASSESSMENT/PLAN Ms. LATRISHA COIRO is a 73 y.o. female with history of stroke, hypertension, PVD, diabetes, hyperlipidemia, status post right CEA, CHF admitted for right-sided weakness and aphasia. TPA given.    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  However, cardioembolic source cannot be excluded.  Resultant intubated on sedation, right hemiparesis  CT showed multiple old lacunar infarcts  CTA head and neck left M1/M2 occlusion.  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  Right precentral gyrus petechial hemorrhage.  MRA left MCA patent with moderate M2 origin stenosis  CT head repeat shows slight increase in right precentral gyrus petechial hemorrhage given on DAPT  2D Echo EF 30 to 35% down from 40 to 45% in 05/2017  LE venous Doppler no DVT  LDL 92  HgbA1c 7.1  SCDs for VTE prophylaxis  aspirin 81 mg daily and clopidogrel 75 mg daily prior to admission, now on aspirin 81 and Plavix due to non-STEMI.   Ongoing aggressive stroke risk factor management  Therapy recommendations: pending  Disposition: Pending  Multi-large vessel severe atherosclerosis and stenosis  CTA head and neck showed bilateral siphon, right ICA and a better subclavian high-grade stenosis  Hypoplastic posterior circulation with  bilateral fetal PCAs  History of  left CEA  History of PVD  History of CAD, following with Dr. Einar Gip as outpatient  If patient has reasonable neurological functional outcome, may consider outpatient follow-up with vascular surgery for subclavian artery stenting  Non-STEMI and cardiomyopathy  Troponin 4.62-6.47-5.08-4.7-3.8-3.88  EF 30 to 35% down from 40 to 45% in 05/2017  Cardiology on board  No intervention recommended  Started on aspirin and Plavix  CT head repeat 03/11/18 shows slight increase in [facility] in  right precentral gyrus petechial hemorrhage given on DAPT  Tapered off nitroglycerin IV  AKI on CKD stage III  creatinine 2.20-2.25->2.97.4.68  CCM on board  BMP monitoring  continue IV fluid   Diabetes  HgbA1c 7.1 goal < 7.0  Uncontrolled  CBG monitoring  SSI  Hypertension . Stable . 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  Put on DAPT and Lipitor 80  Other Active Problems  Hypokalemia-supplement  Hyperglycemia   Hospital day # 9 Plan : Patient's condition remains critical with renal function worsening after CVVRT stopped but   liver enzymes are declining. Neurologically she remains unchanged..  I had a long discussion with the patient's daughter and Dr.McQuaid from critical care medicine a I spoke to the patient's  daughter>they understand her poor prognosis but would like to support her for the next few days and agree with DO NOT RESUSCITATE and one-way extubation soon and do not want   prolonged ventilatory support, trach, peg.This patient is critically ill due to left MCA stroke, severe multivessel atherosclerosis and  stenosis, CHF, non-STEMI, hyperglycemia and at significant risk of neurological worsening, death form recurrent stroke, hemorrhagic conversion, heart failure, seizure, DKA. This patient's care requires constant monitoring of vital signs, hemodynamics, respiratory and cardiac monitoring, review of multiple databases, neurological assessment, discussion with family, other specialists and medical decision making of high complexity. I spent 30 minutes of neurocritical care time in the care of this patient. I had long discussion with her daughter at bedside, updated pt current condition, treatment plan and potential prognosis.She expressed understanding and appreciation.    Antony Contras, MD Stroke Neurology March 30, 2018 3:09 PM    To contact Stroke Continuity provider, please refer to [email or web address] After hours, contact General Neurology

## 2018-04-08 NOTE — Progress Notes (Signed)
Results for JANUARY, BERGTHOLD (MRN [id number]) as of 04/10/2018 11:35  Ref. Range 03/16/2018 15:40 03/16/2018 19:56 03/16/2018 23:23 2018-04-10 03:40 04-10-2018 08:26  Glucose-Capillary Latest Ref Range: 70 - 99 mg/dL 230 (H) 202 (H) 223 (H) 207 (H) 198 (H)  Noted that blood sugars have been greater than 200 mg/dl.  Recommend increasing Lantus to 15 units daily if blood sugars continue to be elevated. Will continue to monitor blood sugars while in the hospital.  Harvel Ricks RN BSN CDE Diabetes Coordinator Pager: [phone]  8am-5pm

## 2018-04-08 NOTE — Progress Notes (Signed)
[facility]ERN[facility] COVERAGE CRITICAL CARE PROGRESS NOTE  CTSP re: tachyarrhythmia and hypotension. Monitor shows atrial fibrillation with rapid ventricular response. BP 50s/30s but labile with erratic readings of SBP 100. Patient was on CRRT, which had to be discontinued due to development of tachyarrhythmia and hemodynamic instability.  Family at bedside. Patient has 5 children (3 sons, 2 daughters), all are present.  They understand that the patient has a grim prognosis. They hoped that CRRT would prove to be beneficial but understand that the patient failed to tolerate CRRT. In light of the absence of significant neurologic improvement after her thrombectomy and the numerous medical complications that have occurred during her hospitalization, the family is unanimously in agreement that the patient should be transitioned to comfort care. They are currently discussing whether or not this should include compassionate extubation.  CODE STATUS remains DNR.  Renee Pain, MD Board Certified by the [employer], [place]

## 2018-04-08 DEATH — deceased

## 2019-12-03 IMAGING — CT CT ANGIO HEAD
2 of 7 series · 8 of 33 positions shown · IV contrast (APPLIED)
Comparison: None.

CLINICAL DATA: Code stroke, deficit not specified

EXAM:
CT ANGIOGRAPHY HEAD AND NECK
TECHNIQUE: Multidetector CT imaging of the head and neck was performed using
the standard protocol during bolus administration of intravenous
contrast. Multiplanar CT image reconstructions and MIPs were
obtained to evaluate the vascular anatomy. Carotid stenosis
measurements (when applicable) are obtained utilizing NASCET
criteria, using the distal internal carotid diameter as the
denominator.
CONTRAST:  50mL 36HKW1-LDT IOPAMIDOL (36HKW1-LDT) INJECTION 76%

[Series 6: cta neck/head · axial · 0.44mm/px · z∈[-232,-118]mm · 2 of 171 slices shown]
[im 57/171  soft-tissue]
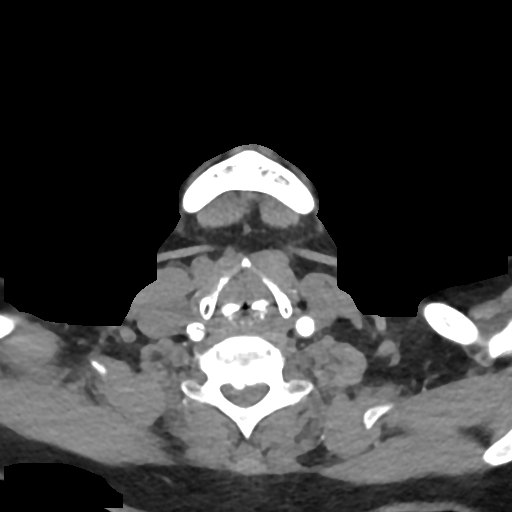
[im 114/171  bone]
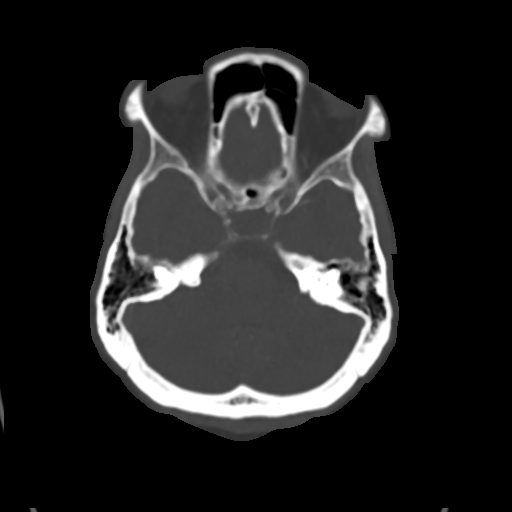

[Series 8: ax thins · axial · 0.39mm/px · z∈[-294,-54]mm · 6 of 337 slices shown]
[im 49/337  soft-tissue]
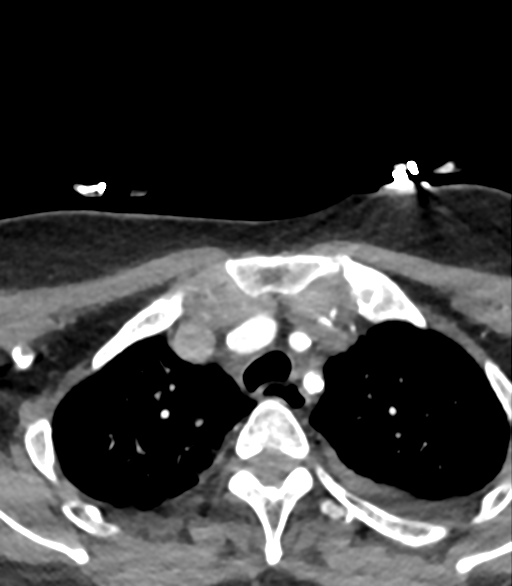
[im 97/337  soft-tissue]
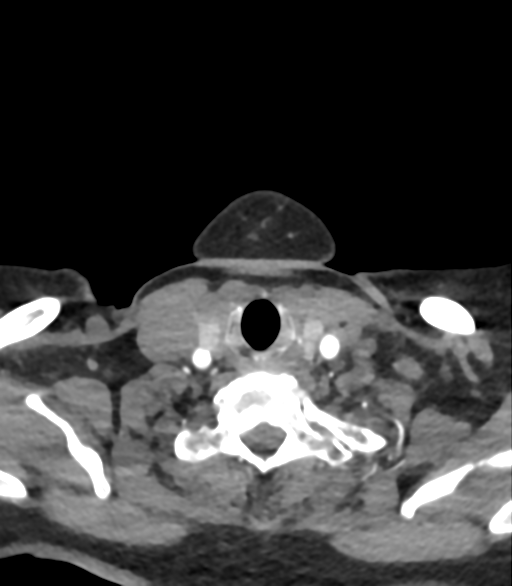
[im 145/337  soft-tissue]
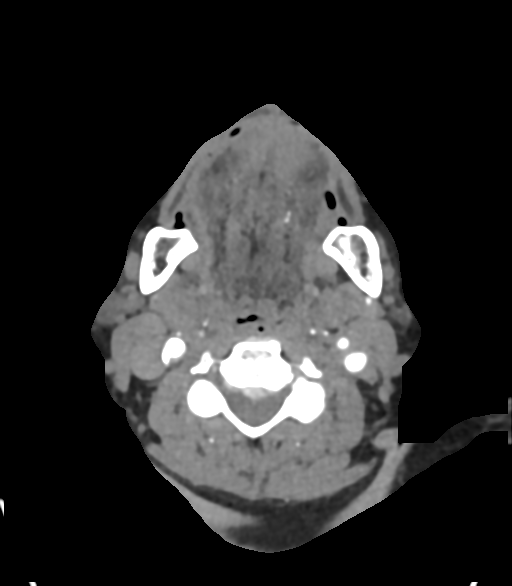
[im 193/337  soft-tissue]
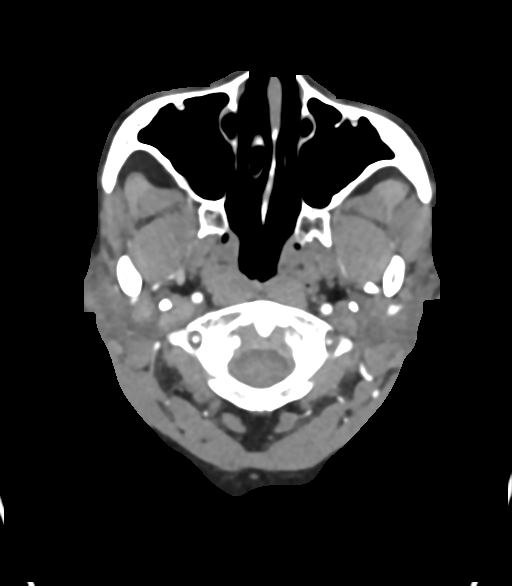
[im 241/337  soft-tissue]
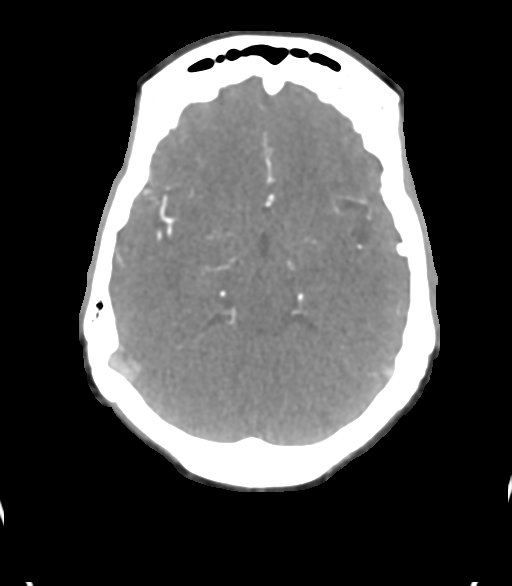
[im 289/337  soft-tissue]
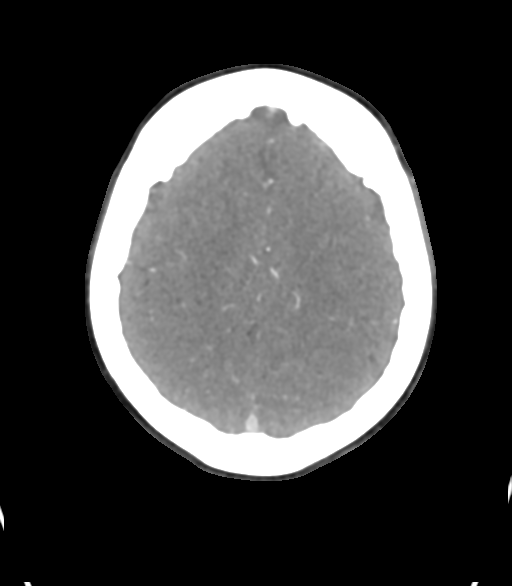

[8 of 33 positions shown; findings below may reference images not displayed]

FINDINGS: CTA NECK FINDINGS

Aortic arch: Extensive atherosclerotic calcification. Three vessel
branching

Right carotid system: Diffuse mainly calcified atherosclerotic
plaque which limits visualization of the lumen due to plaque
blooming. There is distal common carotid stenosis that measures up
to 70% (when compared to the more proximal vessel given the
immediate downstream bifurcation). There is ICA bulb plaque with 70%
stenosis.

Left carotid system: Much less extensive atherosclerotic plaque,
question prior carotid endarterectomy. No stenosis or ulceration.

Vertebral arteries: Severe bilateral proximal subclavian stenosis,
with possible occlusion on the right. Narrowing on the left involves
and extensive segment. No flow is seen within vertebral arteries
until the V3 segments, there may be retrograde flow

Skeleton: Diffuse degenerative disease.  No acute finding

Other neck: Bilateral cataract resection.

Upper chest: Hazy density of the lungs which could be from
atelectasis. No Kerley lines to implicate edema.

Review of the MIP images confirms the above findings

CTA HEAD FINDINGS

Anterior circulation: Confluent atherosclerotic calcification on the
carotid siphons with small vessel size and plaque blooming limiting
luminal visualization. There is a left M2 branch occlusion beginning
at the M1 bifurcation with subsequent downstream reconstitution. No
contralateral embolism is seen.

Posterior circulation: Tiny vertebrobasilar arteries. There may be a
basilar interruption from atherosclerosis or developmental variant.
Fetal type flow to both posterior cerebral arteries which are
symmetrically patent

Venous sinuses: Patent as permitted by contrast timing

Anatomic variants: As above

Delayed phase: Not obtained in the emergent setting

Review of the MIP images confirms the above findings

Case discussed with Dr. Christian Camilo at [DATE] p.m.
IMPRESSION: 1. Left M2 branch occlusion beginning at the M1 bifurcation.
2. Severe atherosclerosis which preferentially spares the left
carotid circulation, question prior endarterectomy.
3. Severe atheromatous narrowing if not occlusion of bilateral
proximal subclavian arteries. There is bilateral fetal type PCA with
diffuse thready flow in the posterior fossa.
4. 70% atheromatous narrowing of the right common carotid and ICA
bulb.

## 2019-12-05 IMAGING — DX DG CHEST 1V PORT
1 series · 1 of 1 positions shown · non-contrast
Comparison: 03/08/2018

CLINICAL DATA: Cerebral infarction and respiratory failure.

EXAM:
PORTABLE CHEST 1 VIEW

[chest]
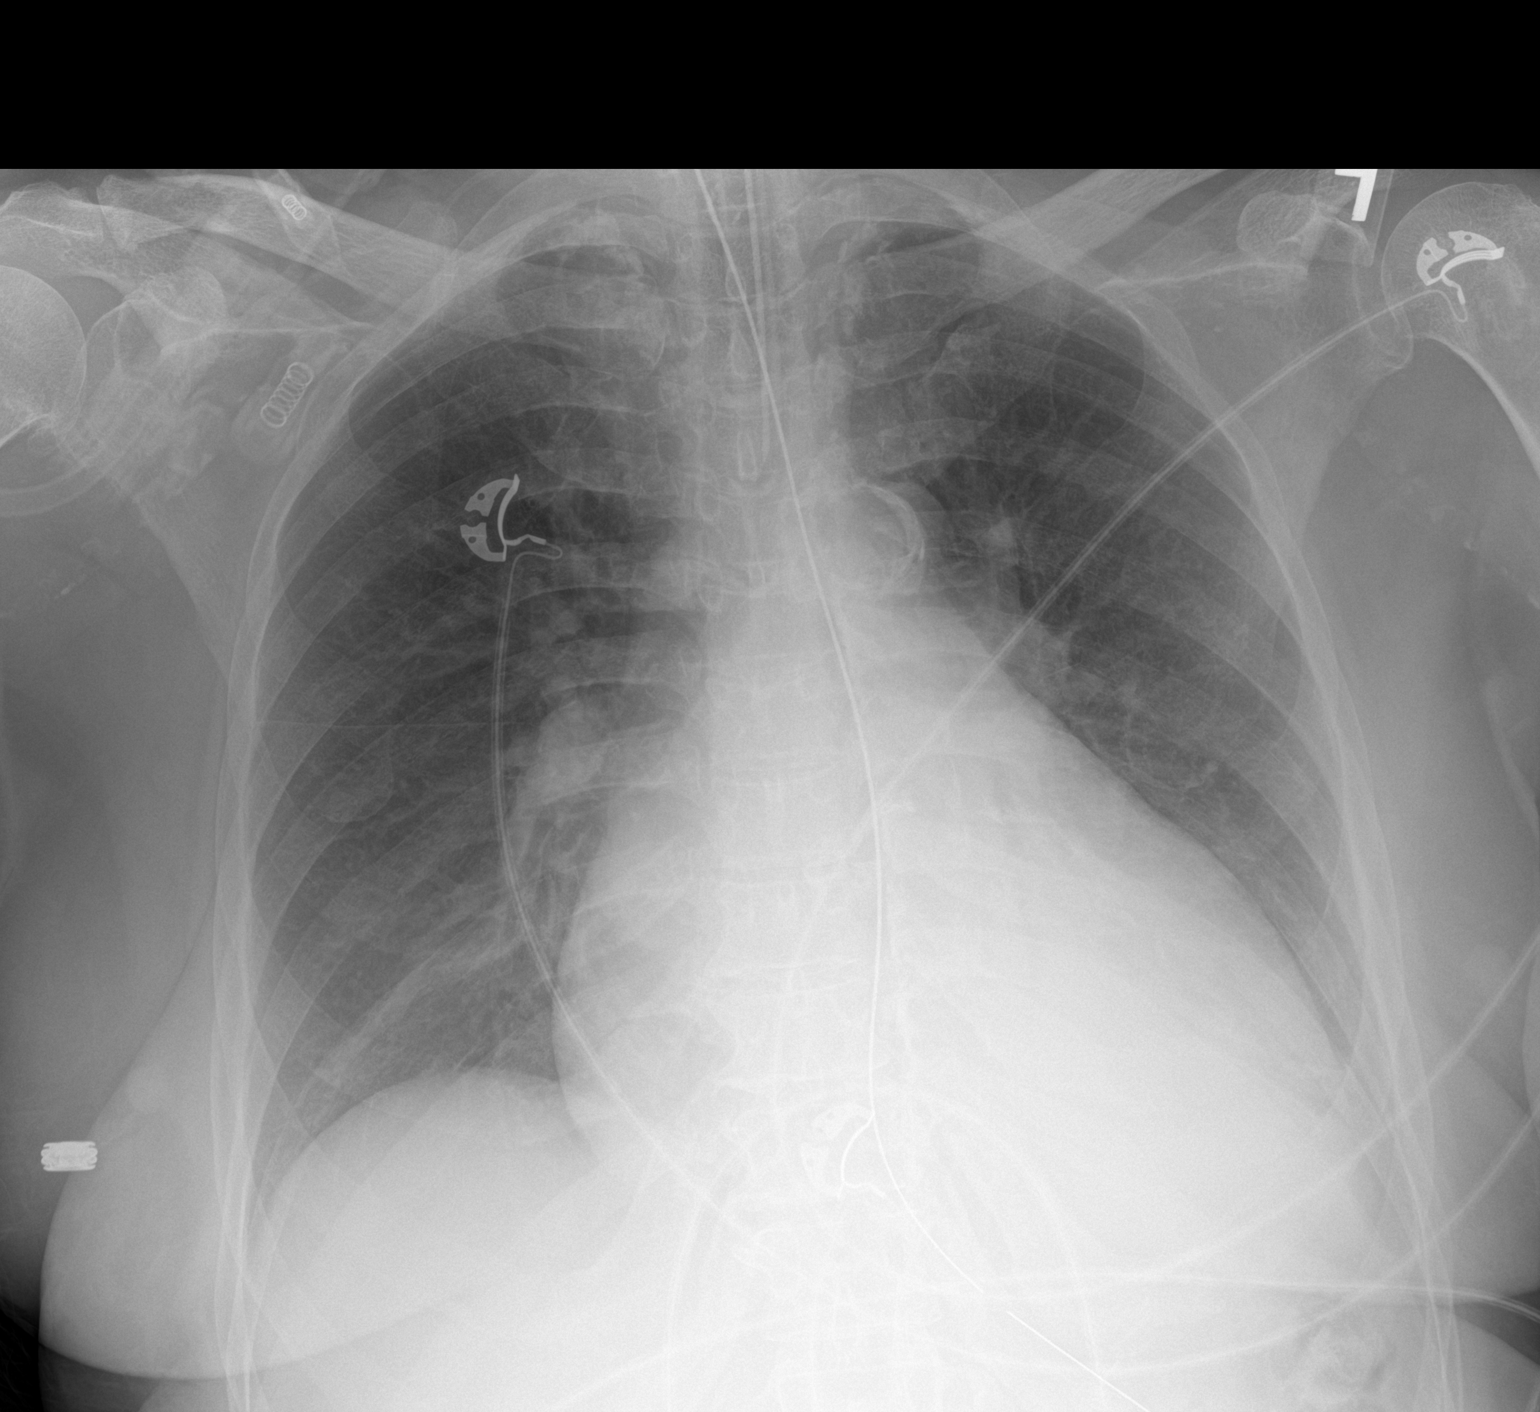

[1 of 1 positions shown; findings below may reference images not displayed]

FINDINGS: Endotracheal tube remains present with the tip approximately 3 cm
above the carina. Gastric decompression tube has been advanced and
now extends further into the stomach. Stable significant cardiac
enlargement. Lungs show some improved aeration at the right base
with mild residual right basilar atelectasis remaining. Left lower
lobe atelectasis is relatively stable. No pulmonary edema,
significant pleural fluid or pneumothorax.
IMPRESSION: Advancement of gastric decompression tube further into the stomach.
Improved aeration at the right lung base with residual mild right
basilar atelectasis. Stable left lower lobe atelectasis.

## 2019-12-08 IMAGING — DX DG CHEST 1V PORT
1 series · 1 of 1 positions shown · non-contrast
Comparison: 03/12/2018

CLINICAL DATA: Endotracheal tube

EXAM:
PORTABLE CHEST 1 VIEW

[chest ap]
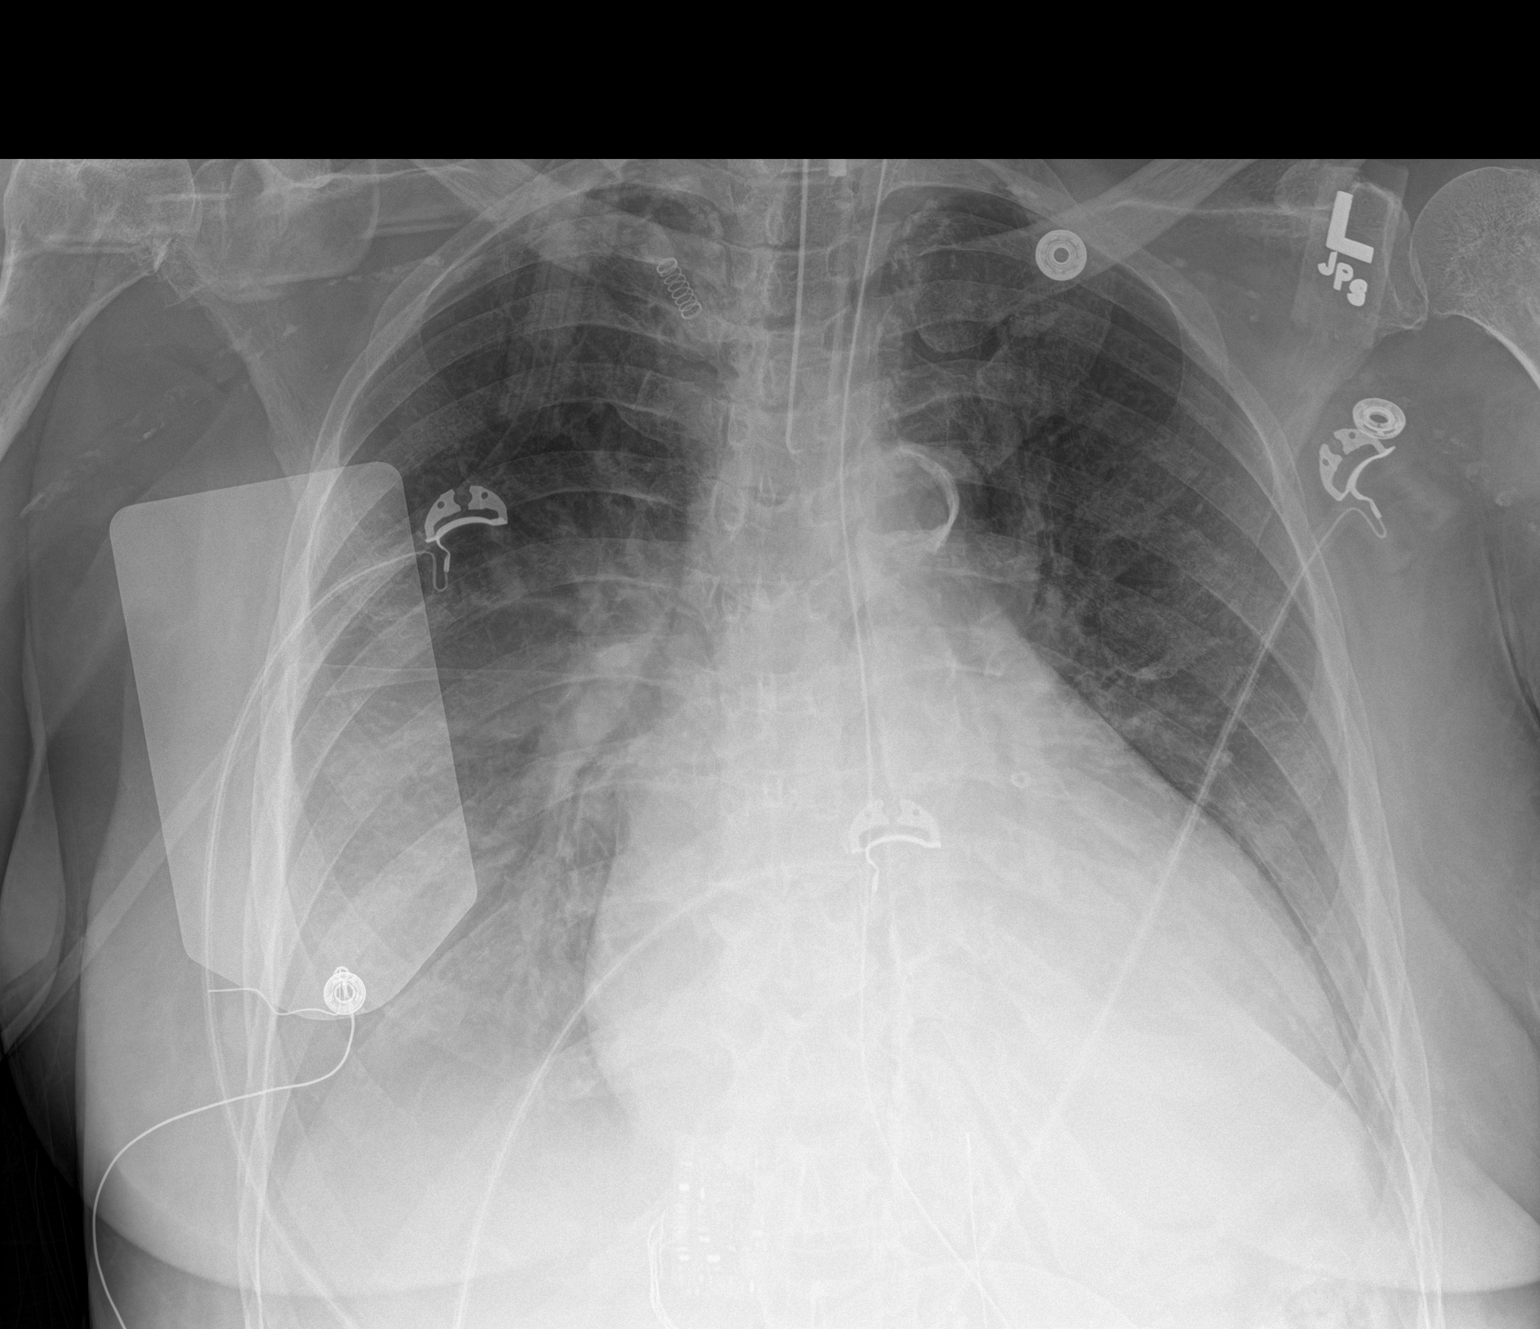

[1 of 1 positions shown; findings below may reference images not displayed]

FINDINGS: The heart is moderately enlarged. NG tube is stable. Endotracheal
tube has been retracted and the tip is now 3.2 cm from the carina.
There is hazy pulmonary opacity bilaterally which is central and
basilar likely combination of pleural fluid and airspace disease. No
pneumothorax. Vascular congestion.
IMPRESSION: Stable bilateral pleural effusions and bilateral hazy airspace
disease.

## 2024-03-07 NOTE — Telephone Encounter (Signed)
"  A user error has taken place: encounter opened in error, closed for administrative reasons.      "
